# Patient Record
Sex: Male | Born: 1960 | Race: White | Hispanic: No | Smoking: Never smoker
Health system: Southern US, Community
[De-identification: ages and names within clinical notes are randomized; demographics above are authoritative.]

## PROBLEM LIST (undated history)

## (undated) DIAGNOSIS — Z8489 Family history of other specified conditions: Secondary | ICD-10-CM

## (undated) DIAGNOSIS — IMO0001 Reserved for inherently not codable concepts without codable children: Secondary | ICD-10-CM

## (undated) DIAGNOSIS — I472 Ventricular tachycardia, unspecified: Secondary | ICD-10-CM

## (undated) DIAGNOSIS — G709 Myoneural disorder, unspecified: Secondary | ICD-10-CM

## (undated) DIAGNOSIS — M869 Osteomyelitis, unspecified: Secondary | ICD-10-CM

## (undated) DIAGNOSIS — J45909 Unspecified asthma, uncomplicated: Secondary | ICD-10-CM

## (undated) DIAGNOSIS — Z8719 Personal history of other diseases of the digestive system: Secondary | ICD-10-CM

## (undated) DIAGNOSIS — Z87442 Personal history of urinary calculi: Secondary | ICD-10-CM

## (undated) DIAGNOSIS — Z95810 Presence of automatic (implantable) cardiac defibrillator: Secondary | ICD-10-CM

## (undated) DIAGNOSIS — I10 Essential (primary) hypertension: Secondary | ICD-10-CM

## (undated) DIAGNOSIS — M1990 Unspecified osteoarthritis, unspecified site: Secondary | ICD-10-CM

## (undated) DIAGNOSIS — I5022 Chronic systolic (congestive) heart failure: Secondary | ICD-10-CM

## (undated) DIAGNOSIS — E876 Hypokalemia: Secondary | ICD-10-CM

## (undated) DIAGNOSIS — I4901 Ventricular fibrillation: Secondary | ICD-10-CM

## (undated) DIAGNOSIS — E119 Type 2 diabetes mellitus without complications: Secondary | ICD-10-CM

## (undated) DIAGNOSIS — H269 Unspecified cataract: Secondary | ICD-10-CM

## (undated) DIAGNOSIS — I509 Heart failure, unspecified: Secondary | ICD-10-CM

## (undated) HISTORY — PX: OTHER SURGICAL HISTORY: SHX169

## (undated) HISTORY — PX: HERNIA REPAIR: SHX51

## (undated) HISTORY — PX: HIP SURGERY: SHX245

## (undated) HISTORY — PX: TONSILLECTOMY: SUR1361

## (undated) HISTORY — PX: FRACTURE SURGERY: SHX138

---

## 2000-07-15 HISTORY — PX: BACK SURGERY: SHX140

## 2003-06-12 ENCOUNTER — Encounter: Admission: RE | Admit: 2003-06-12 | Discharge: 2003-09-10 | Payer: Self-pay | Admitting: Internal Medicine

## 2003-10-27 ENCOUNTER — Encounter: Admission: RE | Admit: 2003-10-27 | Discharge: 2004-01-25 | Payer: Self-pay | Admitting: Internal Medicine

## 2005-12-04 ENCOUNTER — Emergency Department (HOSPITAL_COMMUNITY): Admission: EM | Admit: 2005-12-04 | Discharge: 2005-12-04 | Payer: Self-pay | Admitting: Emergency Medicine

## 2006-08-10 DIAGNOSIS — M549 Dorsalgia, unspecified: Secondary | ICD-10-CM | POA: Insufficient documentation

## 2006-08-10 DIAGNOSIS — I10 Essential (primary) hypertension: Secondary | ICD-10-CM | POA: Insufficient documentation

## 2010-08-15 HISTORY — PX: OTHER SURGICAL HISTORY: SHX169

## 2010-09-27 DIAGNOSIS — I509 Heart failure, unspecified: Secondary | ICD-10-CM | POA: Insufficient documentation

## 2012-08-15 HISTORY — PX: CARDIAC CATHETERIZATION: SHX172

## 2015-07-17 ENCOUNTER — Inpatient Hospital Stay (HOSPITAL_COMMUNITY)
Admission: EM | Admit: 2015-07-17 | Discharge: 2015-07-23 | DRG: 981 | Disposition: A | Discharge status: Home Health | Payer: Managed Care, Other (non HMO) | Attending: Internal Medicine | Admitting: Internal Medicine

## 2015-07-17 ENCOUNTER — Encounter (HOSPITAL_COMMUNITY): Payer: Self-pay

## 2015-07-17 ENCOUNTER — Emergency Department (HOSPITAL_COMMUNITY): Payer: Managed Care, Other (non HMO)

## 2015-07-17 DIAGNOSIS — I5022 Chronic systolic (congestive) heart failure: Secondary | ICD-10-CM | POA: Diagnosis not present

## 2015-07-17 DIAGNOSIS — Z23 Encounter for immunization: Secondary | ICD-10-CM

## 2015-07-17 DIAGNOSIS — I5033 Acute on chronic diastolic (congestive) heart failure: Secondary | ICD-10-CM | POA: Diagnosis not present

## 2015-07-17 DIAGNOSIS — S42309A Unspecified fracture of shaft of humerus, unspecified arm, initial encounter for closed fracture: Secondary | ICD-10-CM | POA: Diagnosis present

## 2015-07-17 DIAGNOSIS — Z95810 Presence of automatic (implantable) cardiac defibrillator: Secondary | ICD-10-CM | POA: Diagnosis not present

## 2015-07-17 DIAGNOSIS — I951 Orthostatic hypotension: Secondary | ICD-10-CM | POA: Diagnosis present

## 2015-07-17 DIAGNOSIS — J45909 Unspecified asthma, uncomplicated: Secondary | ICD-10-CM | POA: Diagnosis present

## 2015-07-17 DIAGNOSIS — R0600 Dyspnea, unspecified: Secondary | ICD-10-CM | POA: Insufficient documentation

## 2015-07-17 DIAGNOSIS — T502X5A Adverse effect of carbonic-anhydrase inhibitors, benzothiadiazides and other diuretics, initial encounter: Secondary | ICD-10-CM | POA: Diagnosis present

## 2015-07-17 DIAGNOSIS — Z6833 Body mass index (BMI) 33.0-33.9, adult: Secondary | ICD-10-CM | POA: Diagnosis not present

## 2015-07-17 DIAGNOSIS — S42302D Unspecified fracture of shaft of humerus, left arm, subsequent encounter for fracture with routine healing: Secondary | ICD-10-CM | POA: Diagnosis not present

## 2015-07-17 DIAGNOSIS — S42301A Unspecified fracture of shaft of humerus, right arm, initial encounter for closed fracture: Secondary | ICD-10-CM | POA: Diagnosis not present

## 2015-07-17 DIAGNOSIS — E876 Hypokalemia: Secondary | ICD-10-CM | POA: Diagnosis present

## 2015-07-17 DIAGNOSIS — W19XXXA Unspecified fall, initial encounter: Secondary | ICD-10-CM | POA: Diagnosis present

## 2015-07-17 DIAGNOSIS — I429 Cardiomyopathy, unspecified: Secondary | ICD-10-CM | POA: Diagnosis present

## 2015-07-17 DIAGNOSIS — E1165 Type 2 diabetes mellitus with hyperglycemia: Secondary | ICD-10-CM | POA: Diagnosis present

## 2015-07-17 DIAGNOSIS — Z79899 Other long term (current) drug therapy: Secondary | ICD-10-CM

## 2015-07-17 DIAGNOSIS — Z7682 Awaiting organ transplant status: Secondary | ICD-10-CM | POA: Diagnosis not present

## 2015-07-17 DIAGNOSIS — Z8249 Family history of ischemic heart disease and other diseases of the circulatory system: Secondary | ICD-10-CM

## 2015-07-17 DIAGNOSIS — E871 Hypo-osmolality and hyponatremia: Secondary | ICD-10-CM | POA: Diagnosis present

## 2015-07-17 DIAGNOSIS — I509 Heart failure, unspecified: Secondary | ICD-10-CM | POA: Diagnosis not present

## 2015-07-17 DIAGNOSIS — E860 Dehydration: Principal | ICD-10-CM | POA: Diagnosis present

## 2015-07-17 DIAGNOSIS — S4290XA Fracture of unspecified shoulder girdle, part unspecified, initial encounter for closed fracture: Secondary | ICD-10-CM | POA: Diagnosis present

## 2015-07-17 DIAGNOSIS — E118 Type 2 diabetes mellitus with unspecified complications: Secondary | ICD-10-CM

## 2015-07-17 DIAGNOSIS — S4291XD Fracture of right shoulder girdle, part unspecified, subsequent encounter for fracture with routine healing: Secondary | ICD-10-CM

## 2015-07-17 DIAGNOSIS — S42211A Unspecified displaced fracture of surgical neck of right humerus, initial encounter for closed fracture: Secondary | ICD-10-CM | POA: Diagnosis present

## 2015-07-17 DIAGNOSIS — I5023 Acute on chronic systolic (congestive) heart failure: Secondary | ICD-10-CM | POA: Diagnosis present

## 2015-07-17 DIAGNOSIS — I959 Hypotension, unspecified: Secondary | ICD-10-CM

## 2015-07-17 DIAGNOSIS — S4291XA Fracture of right shoulder girdle, part unspecified, initial encounter for closed fracture: Secondary | ICD-10-CM

## 2015-07-17 DIAGNOSIS — S42301K Unspecified fracture of shaft of humerus, right arm, subsequent encounter for fracture with nonunion: Secondary | ICD-10-CM | POA: Diagnosis not present

## 2015-07-17 HISTORY — DX: Heart failure, unspecified: I50.9

## 2015-07-17 HISTORY — DX: Unspecified asthma, uncomplicated: J45.909

## 2015-07-17 HISTORY — DX: Chronic systolic (congestive) heart failure: I50.22

## 2015-07-17 LAB — CBC WITH DIFFERENTIAL/PLATELET
BASOS PCT: 0 %
Basophils Absolute: 0 10*3/uL (ref 0.0–0.1)
EOS PCT: 0 %
Eosinophils Absolute: 0 10*3/uL (ref 0.0–0.7)
HEMATOCRIT: 36.8 % — AB (ref 39.0–52.0)
Hemoglobin: 13.6 g/dL (ref 13.0–17.0)
Lymphocytes Relative: 11 %
Lymphs Abs: 1.6 10*3/uL (ref 0.7–4.0)
MCH: 33.9 pg (ref 26.0–34.0)
MCHC: 37 g/dL — AB (ref 30.0–36.0)
MCV: 91.8 fL (ref 78.0–100.0)
MONO ABS: 0.7 10*3/uL (ref 0.1–1.0)
MONOS PCT: 5 %
NEUTROS ABS: 12.7 10*3/uL — AB (ref 1.7–7.7)
Neutrophils Relative %: 85 %
PLATELETS: 271 10*3/uL (ref 150–400)
RBC: 4.01 MIL/uL — ABNORMAL LOW (ref 4.22–5.81)
RDW: 12.6 % (ref 11.5–15.5)
WBC: 15 10*3/uL — ABNORMAL HIGH (ref 4.0–10.5)

## 2015-07-17 LAB — COMPREHENSIVE METABOLIC PANEL
ALBUMIN: 2.8 g/dL — AB (ref 3.5–5.0)
ALT: 61 U/L (ref 17–63)
ANION GAP: 15 (ref 5–15)
AST: 155 U/L — ABNORMAL HIGH (ref 15–41)
Alkaline Phosphatase: 120 U/L (ref 38–126)
BILIRUBIN TOTAL: 2.2 mg/dL — AB (ref 0.3–1.2)
BUN: 21 mg/dL — ABNORMAL HIGH (ref 6–20)
CALCIUM: 8.7 mg/dL — AB (ref 8.9–10.3)
CHLORIDE: 66 mmol/L — AB (ref 101–111)
CO2: 36 mmol/L — AB (ref 22–32)
Creatinine, Ser: 1.31 mg/dL — ABNORMAL HIGH (ref 0.61–1.24)
GFR calc non Af Amer: >60 mL/min (ref >60)
GLUCOSE: 157 mg/dL — AB (ref 65–99)
Sodium: 117 mmol/L — CL (ref 135–145)
Total Protein: 5.7 g/dL — ABNORMAL LOW (ref 6.5–8.1)

## 2015-07-17 LAB — BASIC METABOLIC PANEL
Anion gap: 15 (ref 5–15)
BUN: 22 mg/dL — AB (ref 6–20)
CO2: 32 mmol/L (ref 22–32)
CREATININE: 1.28 mg/dL — AB (ref 0.61–1.24)
Calcium: 8.2 mg/dL — ABNORMAL LOW (ref 8.9–10.3)
Chloride: 69 mmol/L — ABNORMAL LOW (ref 101–111)
GFR calc Af Amer: >60 mL/min (ref >60)
GLUCOSE: 231 mg/dL — AB (ref 65–99)
SODIUM: 116 mmol/L — AB (ref 135–145)

## 2015-07-17 LAB — I-STAT TROPONIN, ED
TROPONIN I, POC: 0.01 ng/mL (ref 0.00–0.08)
TROPONIN I, POC: 0.04 ng/mL (ref 0.00–0.08)

## 2015-07-17 LAB — BRAIN NATRIURETIC PEPTIDE: B Natriuretic Peptide: 63.4 pg/mL (ref 0.0–100.0)

## 2015-07-17 LAB — MAGNESIUM: MAGNESIUM: 2.3 mg/dL (ref 1.7–2.4)

## 2015-07-17 MED ORDER — POTASSIUM CHLORIDE 10 MEQ/100ML IV SOLN
10.0000 meq | INTRAVENOUS | Status: AC
  Administered 2015-07-17 – 2015-07-18 (×4): 10 meq via INTRAVENOUS
  Filled 2015-07-17 (×4): qty 100

## 2015-07-17 MED ORDER — POTASSIUM CHLORIDE CRYS ER 20 MEQ PO TBCR
40.0000 meq | EXTENDED_RELEASE_TABLET | Freq: Once | ORAL | Status: AC
  Administered 2015-07-17: 40 meq via ORAL
  Filled 2015-07-17: qty 2

## 2015-07-17 MED ORDER — SODIUM CHLORIDE 0.9 % IV BOLUS (SEPSIS)
500.0000 mL | Freq: Once | INTRAVENOUS | Status: AC
  Administered 2015-07-17: 500 mL via INTRAVENOUS

## 2015-07-17 NOTE — H&P (Signed)
PCP: No PCP Per Patient    Referring provider Sch[place]man   Chief Complaint:  Arm pain HPI: Justin Bernard is a 54 y.o. male   has a past medical history of CHF (congestive heart failure) (HCC) and Asthma.   Presented with right shoulder pain after a twisting motion yesterday. He has been feeling lightheaded and had had low blood pressures at home. Patient reports he has history of CHF followed by [facility]. He status post defibrillator. Reports he was on the phone with Cardiology at [facility] reporting lightheadedness and then presented to his PCP he was transferred to [facility] from there.  Patient has been on Lasix and has been compliant. In emergency department he was found to have sodium less than 117 potassium less than 2 creatinine 1.3 white blood cell count 15 imaging of the right shoulder showed commuted angulated and displaced proximal right humerus fracture involving surgical neck.  In emergency room patient was found to be hypotensive down to 88/57. He reports feeling lightheaded at home and falling occasionaly. No loss of consciousness no head injury. Reports eating well but avoiding salt. He has been drinking water and orange juice.  No recent diarrhea no nausea no vomiting.   Hospitalist was called for admission for evidence of dehydration and hypotension and hyponatremia with hypokalemia  Review of Systems:    Pertinent positives include: Feeling  Lightheaded  Constitutional:  No weight loss, night sweats, Fevers, chills, fatigue, weight loss  HEENT:  No headaches, Difficulty swallowing,Tooth/dental problems,Sore throat,  No sneezing, itching, ear ache, nasal congestion, post nasal drip,  Cardio-vascular:  No chest pain, Orthopnea, PND, anasarca, dizziness, palpitations.no Bilateral lower extremity swelling  GI:  No heartburn, indigestion, abdominal pain, nausea, vomiting, diarrhea, change in bowel habits, loss of appetite, melena, blood in stool, hematemesis Resp:  no  shortness of breath at rest. No dyspnea on exertion, No excess mucus, no productive cough, No non-productive cough, No coughing up of blood.No change in color of mucus.No wheezing. Skin:  no rash or lesions. No jaundice GU:  no dysuria, change in color of urine, no urgency or frequency. No straining to urinate.  No flank pain.  Musculoskeletal:  No joint pain or no joint swelling. No decreased range of motion. No back pain.  Psych:  No change in mood or affect. No depression or anxiety. No memory loss.  Neuro: no localizing neurological complaints, no tingling, no weakness, no double vision, no gait abnormality, no slurred speech, no confusion  Otherwise ROS are negative except for above, 10 systems were reviewed  Past Medical History: Past Medical History  Diagnosis Date  . CHF (congestive heart failure) (HCC)   . Asthma    Past Surgical History  Procedure Laterality Date  . Back surgery       Medications: Prior to Admission medications   Medication Sig Start Date End Date Taking? Authorizing Provider  carvedilol (COREG) 25 MG tablet Take 12.5 mg by mouth 2 (two) times daily with a meal.  06/04/15  Yes Historical Provider, MD  eplerenone (INSPRA) 25 MG tablet Take 25 mg by mouth daily. 06/04/15  Yes Historical Provider, MD  furosemide (LASIX) 40 MG tablet Take 40 mg by mouth 2 (two) times daily. 05/31/15  Yes Historical Provider, MD  Probiotic Product (PROBIOTIC PO) Take 1 tablet by mouth daily.   Yes Historical Provider, MD    Allergies:   Allergies  Allergen Reactions  . Penicillins Other (See Comments)    Possible childhood reaction  Social History:  Ambulatory   Independently  Lives at home  With family     reports that he has never smoked. He has never used smokeless tobacco. He reports that he does not drink alcohol or use illicit drugs.    Family History: family history includes Hypertension in his other.    Physical Exam: Patient Vitals for the past  24 hrs:  BP Temp Temp src Pulse Resp SpO2  07/17/15 2145 114/65 mmHg - - 94 17 94 %  07/17/15 2130 114/79 mmHg - - 97 - 93 %  07/17/15 2115 128/72 mmHg - - 93 19 92 %  07/17/15 2100 100/84 mmHg - - 92 20 96 %  07/17/15 2046 - - - 91 18 95 %  07/17/15 2045 112/71 mmHg - - - - -  07/17/15 2031 - - - 86 17 95 %  07/17/15 2030 106/65 mmHg - - - - -  07/17/15 2000 116/67 mmHg - - 90 20 95 %  07/17/15 1930 95/67 mmHg - - 93 15 92 %  07/17/15 1915 109/70 mmHg - - 88 17 -  07/17/15 1900 (!) 88/57 mmHg - - 94 13 98 %  07/17/15 1845 - 98 F (36.7 C) Oral - - -  07/17/15 1845 107/71 mmHg - - 92 10 95 %  07/17/15 1843 98/86 mmHg - - 91 - 94 %  07/17/15 1700 - - - 89 15 96 %  07/17/15 1640 - - - - - 92 %    1. General:  in No Acute distress 2. Psychological: Alert and Oriented 3. Head/ENT:    Dry Mucous Membranes                          Head Non traumatic, neck supple                          Normal  Dentition 4. SKIN:  decreased Skin turgor,  Skin clean Dry and intact no rash 5. Heart: Regular rate and rhythm no Murmur, Rub or gallop 6. Lungs: Clear to auscultation bilaterally, no wheezes or crackles   7. Abdomen: Soft, non-tender, Non distended 8. Lower extremities: no clubbing, cyanosis, or edema 9. Neurologically Grossly intact, moving all 4 extremities equally 10. MSK: Normal range of motion  body mass index is unknown because there is no height or weight on file.   Labs on Admission:   Results for orders placed or performed during the hospital encounter of 07/17/15 (from the past 24 hour(s))  CBC with Differential     Status: Abnormal   Collection Time: 07/17/15  7:56 PM  Result Value Ref Range   WBC 15.0 (H) 4.0 - 10.5 K/uL   RBC 4.01 (L) 4.22 - 5.81 MIL/uL   Hemoglobin 13.6 13.0 - 17.0 g/dL   HCT 15.6 (L) 15.3 - 79.4 %   MCV 91.8 78.0 - 100.0 fL   MCH 33.9 26.0 - 34.0 pg   MCHC 37.0 (H) 30.0 - 36.0 g/dL   RDW 32.7 61.4 - 70.9 %   Platelets 271 150 - 400 K/uL    Neutrophils Relative % 85 %   Neutro Abs 12.7 (H) 1.7 - 7.7 K/uL   Lymphocytes Relative 11 %   Lymphs Abs 1.6 0.7 - 4.0 K/uL   Monocytes Relative 5 %   Monocytes Absolute 0.7 0.1 - 1.0 K/uL   Eosinophils Relative 0 %   Eosinophils Absolute  0.0 0.0 - 0.7 K/uL   Basophils Relative 0 %   Basophils Absolute 0.0 0.0 - 0.1 K/uL  Comprehensive metabolic panel     Status: Abnormal   Collection Time: 07/17/15  7:56 PM  Result Value Ref Range   Sodium 117 (LL) 135 - 145 mmol/L   Potassium <2.0 (LL) 3.5 - 5.1 mmol/L   Chloride 66 (L) 101 - 111 mmol/L   CO2 36 (H) 22 - 32 mmol/L   Glucose, Bld 157 (H) 65 - 99 mg/dL   BUN 21 (H) 6 - 20 mg/dL   Creatinine, Ser 4.09 (H) 0.61 - 1.24 mg/dL   Calcium 8.7 (L) 8.9 - 10.3 mg/dL   Total Protein 5.7 (L) 6.5 - 8.1 g/dL   Albumin 2.8 (L) 3.5 - 5.0 g/dL   AST 811 (H) 15 - 41 U/L   ALT 61 17 - 63 U/L   Alkaline Phosphatase 120 38 - 126 U/L   Total Bilirubin 2.2 (H) 0.3 - 1.2 mg/dL   GFR calc non Af Amer >60 >60 mL/min   GFR calc Af Amer >60 >60 mL/min   Anion gap 15 5 - 15  Brain natriuretic peptide     Status: None   Collection Time: 07/17/15  7:56 PM  Result Value Ref Range   B Natriuretic Peptide 63.4 0.0 - 100.0 pg/mL  Magnesium     Status: None   Collection Time: 07/17/15  7:56 PM  Result Value Ref Range   Magnesium 2.3 1.7 - 2.4 mg/dL  I-Stat Troponin, ED - 0, 3, 6 hours (not at [facility])     Status: None   Collection Time: 07/17/15  7:58 PM  Result Value Ref Range   Troponin i, poc 0.01 0.00 - 0.08 ng/mL   Comment 3          I-Stat Troponin, ED - 0, 3, 6 hours (not at [facility])     Status: None   Collection Time: 07/17/15 10:30 PM  Result Value Ref Range   Troponin i, poc 0.04 0.00 - 0.08 ng/mL   Comment 3            UA pending  No results found for: HGBA1C  CrCl cannot be calculated (Unknown ideal weight.).  BNP (last 3 results) No results for input(s): PROBNP in the last 8760 hours.  Other results:  I have pearsonaly reviewed  this: ECG REPORT  Rate:107 Rhythm:  sinus tachycardia  ST&T Change: No ischemic change   QTC 638    There were no vitals filed for this visit.   Cultures: No results found for: SDES, SPECREQUEST, CULT, REPTSTATUS   Radiological Exams on Admission: Dg Chest 1 View  07/17/2015  CLINICAL DATA:  54 year old male with hypotension. Medical history of CHF and asthma. EXAM: CHEST 1 VIEW COMPARISON:  Chest radiograph dated 12/04/2005 FINDINGS: Single frontal view of the chest does not demonstrate any focal consolidation, pleural effusion, or pneumothorax. Left lung base clear linear plaque likely atelectatic changes noted. The cardiac silhouette is within normal limits. Left pectoral AICD device. The osseous structures are grossly unremarkable. IMPRESSION: No active disease. Electronically Signed   By: Elgie Collard M.D.   On: 07/17/2015 20:54   Dg Shoulder Right  07/17/2015  CLINICAL DATA:  Right shoulder injury EXAM: RIGHT SHOULDER - 2+ VIEW COMPARISON:  None. FINDINGS: There is a comminuted right proximal humerus fracture involving the surgical neck, with mild apex lateral angulation, slight overriding of the fracture fragments and 1 shaft's with anterior  displacement of the dominant distal fracture fragment. No dislocation at the right glenohumeral joint. 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 IMPRESSION: Comminuted, angulated and displaced proximal right humerus fracture involving the surgical neck. Electronically Signed   By: [name] M.D.   On: 07/17/2015 17:44    Chart has been reviewed  Family not  at  Bedside    Assessment/Plan 54 yo M with Hx of systolic CHF since 2011 reports last EF was to to 47%  Here with dehydration, hyponatremia down to 117, hypokalemia down to <2.0 admit to stepdown due to hypotension.   Present on Admission:  . Hypokalemia - will replace and continue to monitor, hold lasix for  tonight . Orthostatic hypotension - will hold lasix  appears to be over diuresed, would benefit from discussing his medication regimen with cardiology at [facility] [phone] . Chronic systolic CHF (congestive heart failure), NYHA class 2 (HCC) - currently appears dry will hold lasix and rehydrate . Dehydration - administer IV, hold lasix . Hyponatremia - likely due to dehydration and low Sodium diet, will rehydrate with Normal salin, obtain orthostatics, restrict free fluid, serial BMET to monitor Na levels . Fracture closed, humerus - orthopedics in AM . Fall - no LOC, no head injury likely due to orthostatic hypotension   Prophylaxis:  Lovenox   CODE STATUS:   DNI but Ok to do CPR no intubation as per patient    Disposition:  To home once workup is complete and patient is stable  Other plan as per orders.  I have spent a total of 65 min on this admission extra time was taken to discuss case with E-link  Tedford Berg 07/17/2015, 11:33 PM  [facility]  Pager [phone]   after 2 AM please page floor coverage PA If 7AM-7PM, please contact the day team taking care of the patient  [email or web address]  Password [email or web address]

## 2015-07-17 NOTE — ED Provider Notes (Signed)
CSN: [id number]     Arrival date & time 07/17/15  1640 History   First MD Initiated Contact with Patient 07/17/15 1707     Chief Complaint  Patient presents with  . Shoulder Injury  . Hypotension  . Dizziness     (Consider location/radiation/quality/duration/timing/severity/associated sxs/prior Treatment) HPI Comments: Stood up, felt lightheaded, grabbed footboard, shen sat back down last night when woke from nap at 1030 was achy.  Has had lightheadedness, primarily while going from sitting to standing BP running lower 90s systolic, 100s No recent changes in medications Called Cardiologist-- recommended he come to ED Pt denies fall at time of ar injury, however reports felt lightheaded then started tof all and caught self with pain to right arm, then went back to sleep and woke with right arm ache, moderate pain       Patient is a 54 y.o. male presenting with shoulder injury and dizziness.  Shoulder Injury This is a new problem. Episode onset: last night. Pertinent negatives include no chest pain, no abdominal pain, no headaches and no shortness of breath. He has tried nothing for the symptoms.  Dizziness Associated symptoms: no chest pain, no diarrhea, no headaches, no nausea, no shortness of breath and no vomiting     Past Medical History  Diagnosis Date  . CHF (congestive heart failure) (HCC)   . Asthma    Past Surgical History  Procedure Laterality Date  . Back surgery     Family History  Problem Relation Age of Onset  . Hypertension Other   . CAD Other   . Heart failure Mother   . Hypertension Mother   . CAD Father    Social History  Substance Use Topics  . Smoking status: Never Smoker   . Smokeless tobacco: Never Used  . Alcohol Use: No    Review of Systems  Constitutional: Positive for fatigue. Negative for fever.  HENT: Negative for sore throat.   Eyes: Negative for visual disturbance.  Respiratory: Negative for shortness of breath.    Cardiovascular: Negative for chest pain and leg swelling.  Gastrointestinal: Negative for nausea, vomiting, abdominal pain and diarrhea.  Genitourinary: Negative for difficulty urinating.  Musculoskeletal: Negative for back pain and neck stiffness.  Skin: Negative for rash.  Neurological: Positive for dizziness and light-headedness. Negative for syncope and headaches.      Allergies  Penicillins  Home Medications   Prior to Admission medications   Medication Sig Start Date End Date Taking? Authorizing Provider  carvedilol (COREG) 25 MG tablet Take 12.5 mg by mouth 2 (two) times daily with a meal.  06/04/15  Yes Historical Provider, MD  eplerenone (INSPRA) 25 MG tablet Take 25 mg by mouth daily. 06/04/15  Yes Historical Provider, MD  furosemide (LASIX) 40 MG tablet Take 40 mg by mouth 2 (two) times daily. 05/31/15  Yes Historical Provider, MD  Probiotic Product (PROBIOTIC PO) Take 1 tablet by mouth daily.   Yes Historical Provider, MD   BP 103/55 mmHg  Pulse 88  Temp(Src) 98 F (36.7 C) (Oral)  Resp 23  Ht  (1.93 m)  Wt 278 lb 10.6 oz (126.4 kg)  BMI 33.93 kg/m2  SpO2 96% Physical Exam  Constitutional: He is oriented to person, place, and time. He appears well-developed and well-nourished. No distress.  HENT:  Head: Normocephalic and atraumatic.  Eyes: Conjunctivae and EOM are normal.  Neck: Normal range of motion. No JVD present.  Cardiovascular: Normal rate, regular rhythm, normal heart sounds and intact  distal pulses.  Exam reveals no gallop and no friction rub.   No murmur heard. Pulmonary/Chest: Effort normal and breath sounds normal. No respiratory distress. He has no wheezes. He has no rales.  Abdominal: Soft. He exhibits no distension. There is no tenderness. There is no guarding.  Musculoskeletal: He exhibits tenderness (contusion right arm). He exhibits no edema.  Neurological: He is alert and oriented to person, place, and time.  Skin: Skin is warm and dry.  He is not diaphoretic.  Nursing note and vitals reviewed.   ED Course  Procedures (including critical care time) Labs Review Labs Reviewed  CBC WITH DIFFERENTIAL/PLATELET - Abnormal; Notable for the following:    WBC 15.0 (*)    RBC 4.01 (*)    HCT 36.8 (*)    MCHC 37.0 (*)    Neutro Abs 12.7 (*)    All other components within normal limits  COMPREHENSIVE METABOLIC PANEL - Abnormal; Notable for the following:    Sodium 117 (*)    Potassium <2.0 (*)    Chloride 66 (*)    CO2 36 (*)    Glucose, Bld 157 (*)    BUN 21 (*)    Creatinine, Ser 1.31 (*)    Calcium 8.7 (*)    Total Protein 5.7 (*)    Albumin 2.8 (*)    AST 155 (*)    Total Bilirubin 2.2 (*)    All other components within normal limits  URINALYSIS, ROUTINE W REFLEX MICROSCOPIC (NOT AT [facility]) - Abnormal; Notable for the following:    Color, Urine AMBER (*)    APPearance CLOUDY (*)    Hgb urine dipstick LARGE (*)    Bilirubin Urine SMALL (*)    Ketones, ur 15 (*)    Protein, ur 30 (*)    Leukocytes, UA SMALL (*)    All other components within normal limits  BASIC METABOLIC PANEL - Abnormal; Notable for the following:    Sodium 116 (*)    Potassium <2.0 (*)    Chloride 69 (*)    Glucose, Bld 231 (*)    BUN 22 (*)    Creatinine, Ser 1.28 (*)    Calcium 8.2 (*)    All other components within normal limits  URINE MICROSCOPIC-ADD ON - Abnormal; Notable for the following:    Squamous Epithelial / LPF 0-5 (*)    Bacteria, UA RARE (*)    Casts HYALINE CASTS (*)    All other components within normal limits  COMPREHENSIVE METABOLIC PANEL - Abnormal; Notable for the following:    Sodium 118 (*)    Potassium 2.1 (*)    Chloride 76 (*)    Glucose, Bld 199 (*)    BUN 22 (*)    Creatinine, Ser 1.38 (*)    Calcium 8.0 (*)    Total Protein 4.9 (*)    Albumin 2.4 (*)    AST 114 (*)    Total Bilirubin 2.0 (*)    GFR calc non Af Amer 57 (*)    All other components within normal limits  CBC - Abnormal; Notable for the  following:    WBC 13.3 (*)    RBC 3.48 (*)    Hemoglobin 11.8 (*)    HCT 32.8 (*)    All other components within normal limits  BASIC METABOLIC PANEL - Abnormal; Notable for the following:    Sodium 118 (*)    Potassium 2.2 (*)    Chloride 77 (*)  Glucose, Bld 167 (*)    Calcium 7.9 (*)    All other components within normal limits  BASIC METABOLIC PANEL - Abnormal; Notable for the following:    Sodium 118 (*)    Potassium 2.4 (*)    Chloride 78 (*)    Glucose, Bld 171 (*)    BUN 21 (*)    Calcium 8.1 (*)    All other components within normal limits  URINALYSIS, ROUTINE W REFLEX MICROSCOPIC (NOT AT [facility]) - Abnormal; Notable for the following:    APPearance CLOUDY (*)    Hgb urine dipstick LARGE (*)    Leukocytes, UA MODERATE (*)    All other components within normal limits  TROPONIN I - Abnormal; Notable for the following:    Troponin I 0.04 (*)    All other components within normal limits  URINE MICROSCOPIC-ADD ON - Abnormal; Notable for the following:    Squamous Epithelial / LPF 0-5 (*)    Bacteria, UA FEW (*)    All other components within normal limits  CORTISOL-AM, BLOOD - Abnormal; Notable for the following:    Cortisol - AM 22.8 (*)    All other components within normal limits  URINE CULTURE  MRSA PCR SCREENING  BRAIN NATRIURETIC PEPTIDE  MAGNESIUM  CREATININE, URINE, RANDOM  SODIUM, URINE, RANDOM  OSMOLALITY, URINE  MAGNESIUM  PHOSPHORUS  TSH  TROPONIN I  BASIC METABOLIC PANEL  BASIC METABOLIC PANEL  BASIC METABOLIC PANEL  TROPONIN I  I-STAT TROPOININ, ED  I-STAT TROPOININ, ED  Rosezena Sensor, ED    Imaging Review Dg Chest 1 View  07/17/2015  CLINICAL DATA:  54 year old male with hypotension. Medical history of CHF and asthma. EXAM: CHEST 1 VIEW COMPARISON:  Chest radiograph dated 12/04/2005 FINDINGS: Single frontal view of the chest does not demonstrate any focal consolidation, pleural effusion, or pneumothorax. Left lung base clear linear plaque  likely atelectatic changes noted. The cardiac silhouette is within normal limits. Left pectoral AICD device. The osseous structures are grossly unremarkable. IMPRESSION: No active disease. Electronically Signed   By: Elgie Collard M.D.   On: 07/17/2015 20:54   Dg Shoulder Right  07/17/2015  CLINICAL DATA:  Right shoulder injury EXAM: RIGHT SHOULDER - 2+ VIEW COMPARISON:  None. FINDINGS: There is a comminuted right proximal humerus fracture involving the surgical neck, with mild apex lateral angulation, slight overriding of the fracture fragments and 1 shaft's with anterior displacement of the dominant distal fracture fragment. No dislocation at the right glenohumeral joint. 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 IMPRESSION: Comminuted, angulated and displaced proximal right humerus fracture involving the surgical neck. Electronically Signed   By: [name] M.D.   On: 07/17/2015 17:44   I have personally reviewed and evaluated these images and lab results as part of my medical decision-making.   EKG Interpretation   Date/Time:  Friday July 17 2015 16:55:15 EST Ventricular Rate:  107 PR Interval:  185 QRS Duration: 115 QT Interval:  475 QTC Calculation: 634 R Axis:   46 Text Interpretation:  Sinus tachycardia Ventricular premature complex  Nonspecific intraventricular conduction delay Inferior infarct, old  Probable anteroseptal infarct, old Abnrm T, consider ischemia,  anterolateral lds No prior ECGs available Confirmed by [facility] MD, Garyson Stelly  ([id number]) on 07/17/2015 11:10:04 PM       CRITICAL CARE:hypotension, electrolyte abnormalities severe hyponatremia/kalemia Performed by: Rhae Lerner   Total critical care time: 30 minutes  Critical care time was exclusive of separately billable procedures and treating  other patients.  Critical care was necessary to treat or prevent imminent or life-threatening deterioration.  Critical care was time  spent personally by me on the following activities: development of treatment plan with patient and/or surrogate as well as nursing, discussions with consultants, evaluation of patient's response to treatment, examination of patient, obtaining history from patient or surrogate, ordering and performing treatments and interventions, ordering and review of laboratory studies, ordering and review of radiographic studies, pulse oximetry and re-evaluation of patient's condition.   MDM   Final diagnoses:  Hypokalemia  Hyponatremia   54 year old male with a history of chronic systolic heart failure with defibrillator in place followed by [facility] cardiology presents with concern of lightheadedness and low blood pressures at home as well as right shoulder pain.  Patient denies fall, however step-son reports he was found down laying on ground yesterday.  Patient denies head trauma/HA, has no neurologic symptoms, no blood thinners, and doubt intracranial bleed.  XR of right shoulder shows proximal humerus fx. Pt nv intact, and will place in sling.  Patient with lightheadedness, orthostatic symptoms, low BP with history of lasix usage.  No sign of current chf exacerbation.  Labs show significant dehydration/electrolytea abnormalities with potassium less than 2, Na of 117.  Pt give 500cc NS initially followed by potassium iV and po.  He will be admitted to hospitalist for further care to stepdown.    Alvira Monday, MD 07/18/15 1535

## 2015-07-17 NOTE — ED Notes (Signed)
Pt arrived via [facility] from home c/o right shoulder pain.  Pt states he has been dizzy due to hypotension recently, he was getting out of bed yesterday grabbed the footboard and injured his shoulder.  Pt states he did not fall and hit the floor, pain started this morning. Hx:CHF

## 2015-07-18 ENCOUNTER — Encounter (HOSPITAL_COMMUNITY): Payer: Self-pay | Admitting: *Deleted

## 2015-07-18 ENCOUNTER — Inpatient Hospital Stay (HOSPITAL_COMMUNITY): Payer: Managed Care, Other (non HMO)

## 2015-07-18 DIAGNOSIS — I509 Heart failure, unspecified: Secondary | ICD-10-CM

## 2015-07-18 LAB — BASIC METABOLIC PANEL
Anion gap: 11 (ref 5–15)
Anion gap: 11 (ref 5–15)
Anion gap: 9 (ref 5–15)
Anion gap: 11 (ref 5–15)
BUN: 19 mg/dL (ref 6–20)
BUN: 21 mg/dL — ABNORMAL HIGH (ref 6–20)
BUN: 20 mg/dL (ref 6–20)
BUN: 19 mg/dL (ref 6–20)
CALCIUM: 7.9 mg/dL — AB (ref 8.9–10.3)
CALCIUM: 8.1 mg/dL — AB (ref 8.9–10.3)
CALCIUM: 8 mg/dL — AB (ref 8.9–10.3)
CALCIUM: 8.2 mg/dL — AB (ref 8.9–10.3)
CO2: 30 mmol/L (ref 22–32)
CO2: 29 mmol/L (ref 22–32)
CO2: 30 mmol/L (ref 22–32)
CO2: 29 mmol/L (ref 22–32)
CREATININE: 1.11 mg/dL (ref 0.61–1.24)
CREATININE: 1.09 mg/dL (ref 0.61–1.24)
CREATININE: 1.15 mg/dL (ref 0.61–1.24)
CREATININE: 1.22 mg/dL (ref 0.61–1.24)
Chloride: 77 mmol/L — ABNORMAL LOW (ref 101–111)
Chloride: 78 mmol/L — ABNORMAL LOW (ref 101–111)
Chloride: 82 mmol/L — ABNORMAL LOW (ref 101–111)
Chloride: 82 mmol/L — ABNORMAL LOW (ref 101–111)
GFR calc Af Amer: >60 mL/min (ref >60)
GFR calc non Af Amer: >60 mL/min (ref >60)
GFR calc non Af Amer: >60 mL/min (ref >60)
GFR calc non Af Amer: >60 mL/min (ref >60)
GFR calc non Af Amer: >60 mL/min (ref >60)
GLUCOSE: 171 mg/dL — AB (ref 65–99)
GLUCOSE: 229 mg/dL — AB (ref 65–99)
GLUCOSE: 234 mg/dL — AB (ref 65–99)
Glucose, Bld: 167 mg/dL — ABNORMAL HIGH (ref 65–99)
Potassium: 2.2 mmol/L — CL (ref 3.5–5.1)
Potassium: 2.4 mmol/L — CL (ref 3.5–5.1)
Potassium: 2.4 mmol/L — CL (ref 3.5–5.1)
Potassium: 2.3 mmol/L — CL (ref 3.5–5.1)
Sodium: 118 mmol/L — CL (ref 135–145)
Sodium: 118 mmol/L — CL (ref 135–145)
Sodium: 121 mmol/L — ABNORMAL LOW (ref 135–145)
Sodium: 122 mmol/L — ABNORMAL LOW (ref 135–145)

## 2015-07-18 LAB — URINALYSIS, ROUTINE W REFLEX MICROSCOPIC
BILIRUBIN URINE: NEGATIVE
GLUCOSE, UA: NEGATIVE mg/dL
Glucose, UA: NEGATIVE mg/dL
KETONES UR: NEGATIVE mg/dL
Ketones, ur: 15 mg/dL — AB
NITRITE: NEGATIVE
Nitrite: NEGATIVE
PH: 6.5 (ref 5.0–8.0)
PH: 6.5 (ref 5.0–8.0)
Protein, ur: 30 mg/dL — AB
Protein, ur: NEGATIVE mg/dL
SPECIFIC GRAVITY, URINE: 1.016 (ref 1.005–1.030)
Specific Gravity, Urine: 1.007 (ref 1.005–1.030)

## 2015-07-18 LAB — COMPREHENSIVE METABOLIC PANEL
ALBUMIN: 2.4 g/dL — AB (ref 3.5–5.0)
ALK PHOS: 104 U/L (ref 38–126)
ALT: 48 U/L (ref 17–63)
AST: 114 U/L — AB (ref 15–41)
Anion gap: 14 (ref 5–15)
BILIRUBIN TOTAL: 2 mg/dL — AB (ref 0.3–1.2)
BUN: 22 mg/dL — AB (ref 6–20)
CALCIUM: 8 mg/dL — AB (ref 8.9–10.3)
CO2: 28 mmol/L (ref 22–32)
CREATININE: 1.38 mg/dL — AB (ref 0.61–1.24)
Chloride: 76 mmol/L — ABNORMAL LOW (ref 101–111)
GFR calc Af Amer: >60 mL/min (ref >60)
GFR calc non Af Amer: 57 mL/min — ABNORMAL LOW (ref >60)
GLUCOSE: 199 mg/dL — AB (ref 65–99)
POTASSIUM: 2.1 mmol/L — AB (ref 3.5–5.1)
SODIUM: 118 mmol/L — AB (ref 135–145)
TOTAL PROTEIN: 4.9 g/dL — AB (ref 6.5–8.1)

## 2015-07-18 LAB — CBC
HEMATOCRIT: 32.8 % — AB (ref 39.0–52.0)
Hemoglobin: 11.8 g/dL — ABNORMAL LOW (ref 13.0–17.0)
MCH: 33.9 pg (ref 26.0–34.0)
MCHC: 36 g/dL (ref 30.0–36.0)
MCV: 94.3 fL (ref 78.0–100.0)
Platelets: 229 10*3/uL (ref 150–400)
RBC: 3.48 MIL/uL — ABNORMAL LOW (ref 4.22–5.81)
RDW: 12.7 % (ref 11.5–15.5)
WBC: 13.3 10*3/uL — ABNORMAL HIGH (ref 4.0–10.5)

## 2015-07-18 LAB — URINE MICROSCOPIC-ADD ON

## 2015-07-18 LAB — TROPONIN I
TROPONIN I: 0.03 ng/mL (ref <0.031)
Troponin I: 0.03 ng/mL (ref <0.031)
Troponin I: 0.04 ng/mL — ABNORMAL HIGH (ref <0.031)

## 2015-07-18 LAB — I-STAT TROPONIN, ED: Troponin i, poc: 0.02 ng/mL (ref 0.00–0.08)

## 2015-07-18 LAB — PHOSPHORUS: Phosphorus: 2.5 mg/dL (ref 2.5–4.6)

## 2015-07-18 LAB — CORTISOL-AM, BLOOD: Cortisol - AM: 22.8 ug/dL — ABNORMAL HIGH (ref 6.7–22.6)

## 2015-07-18 LAB — MRSA PCR SCREENING: MRSA by PCR: NEGATIVE

## 2015-07-18 LAB — SODIUM, URINE, RANDOM

## 2015-07-18 LAB — MAGNESIUM: Magnesium: 2.2 mg/dL (ref 1.7–2.4)

## 2015-07-18 LAB — TSH: TSH: 2.054 u[IU]/mL (ref 0.350–4.500)

## 2015-07-18 LAB — CREATININE, URINE, RANDOM: CREATININE, URINE: 137.17 mg/dL

## 2015-07-18 LAB — OSMOLALITY, URINE: Osmolality, Ur: 346 mOsm/kg (ref 300–900)

## 2015-07-18 MED ORDER — HYDROCODONE-ACETAMINOPHEN 5-325 MG PO TABS
1.0000 | ORAL_TABLET | ORAL | Status: DC | PRN
  Administered 2015-07-20 – 2015-07-23 (×9): 2 via ORAL
  Filled 2015-07-18 (×9): qty 2

## 2015-07-18 MED ORDER — ENOXAPARIN SODIUM 30 MG/0.3ML ~~LOC~~ SOLN
30.0000 mg | SUBCUTANEOUS | Status: DC
  Administered 2015-07-18: 30 mg via SUBCUTANEOUS
  Filled 2015-07-18 (×2): qty 0.3

## 2015-07-18 MED ORDER — ONDANSETRON HCL 4 MG/2ML IJ SOLN
4.0000 mg | Freq: Four times a day (QID) | INTRAMUSCULAR | Status: DC | PRN
Start: 2015-07-18 — End: 2015-07-20
  Administered 2015-07-20: 4 mg via INTRAVENOUS
  Filled 2015-07-18: qty 2

## 2015-07-18 MED ORDER — SODIUM CHLORIDE 0.9 % IV SOLN
INTRAVENOUS | Status: DC
  Administered 2015-07-18: 01:00:00 via INTRAVENOUS

## 2015-07-18 MED ORDER — POTASSIUM CHLORIDE 10 MEQ/100ML IV SOLN
10.0000 meq | INTRAVENOUS | Status: AC
  Administered 2015-07-18 (×3): 10 meq via INTRAVENOUS
  Filled 2015-07-18 (×3): qty 100

## 2015-07-18 MED ORDER — ONDANSETRON HCL 4 MG PO TABS: 4.0000 mg | ORAL_TABLET | Freq: Four times a day (QID) | ORAL | Status: DC | PRN

## 2015-07-18 MED ORDER — SODIUM CHLORIDE 0.9 % IV BOLUS (SEPSIS)
500.0000 mL | Freq: Once | INTRAVENOUS | Status: AC
  Administered 2015-07-18: 500 mL via INTRAVENOUS

## 2015-07-18 MED ORDER — PNEUMOCOCCAL VAC POLYVALENT 25 MCG/0.5ML IJ INJ
0.5000 mL | INJECTION | INTRAMUSCULAR | Status: AC
  Administered 2015-07-19: 0.5 mL via INTRAMUSCULAR
  Filled 2015-07-18: qty 0.5

## 2015-07-18 MED ORDER — ACETAMINOPHEN 325 MG PO TABS: 650.0000 mg | ORAL_TABLET | Freq: Four times a day (QID) | ORAL | Status: DC | PRN

## 2015-07-18 MED ORDER — POTASSIUM CHLORIDE IN NACL 40-0.9 MEQ/L-% IV SOLN
INTRAVENOUS | Status: DC
  Administered 2015-07-18: 75 mL/h via INTRAVENOUS
  Filled 2015-07-18 (×3): qty 1000

## 2015-07-18 MED ORDER — POTASSIUM CHLORIDE CRYS ER 20 MEQ PO TBCR
40.0000 meq | EXTENDED_RELEASE_TABLET | ORAL | Status: AC
  Administered 2015-07-18 (×3): 40 meq via ORAL
  Filled 2015-07-18 (×3): qty 2

## 2015-07-18 MED ORDER — MAGNESIUM CHLORIDE 64 MG PO TBEC
1.0000 | DELAYED_RELEASE_TABLET | Freq: Two times a day (BID) | ORAL | Status: DC
  Administered 2015-07-18 – 2015-07-23 (×11): 64 mg via ORAL
  Filled 2015-07-18 (×12): qty 1

## 2015-07-18 MED ORDER — POTASSIUM CHLORIDE CRYS ER 20 MEQ PO TBCR
40.0000 meq | EXTENDED_RELEASE_TABLET | ORAL | Status: AC
  Administered 2015-07-18: 30 meq via ORAL
  Administered 2015-07-18: 40 meq via ORAL
  Administered 2015-07-18: 10 meq via ORAL
  Administered 2015-07-19: 40 meq via ORAL
  Filled 2015-07-18 (×2): qty 2

## 2015-07-18 MED ORDER — SODIUM CHLORIDE 0.9 % IJ SOLN
3.0000 mL | Freq: Two times a day (BID) | INTRAMUSCULAR | Status: DC
  Administered 2015-07-18 – 2015-07-22 (×10): 3 mL via INTRAVENOUS

## 2015-07-18 MED ORDER — PERFLUTREN LIPID MICROSPHERE
1.0000 mL | INTRAVENOUS | Status: AC | PRN
  Administered 2015-07-18: 2 mL via INTRAVENOUS
  Filled 2015-07-18: qty 10

## 2015-07-18 MED ORDER — POTASSIUM CHLORIDE 10 MEQ/100ML IV SOLN
10.0000 meq | INTRAVENOUS | Status: DC
  Administered 2015-07-18 (×2): 10 meq via INTRAVENOUS
  Filled 2015-07-18 (×4): qty 100

## 2015-07-18 MED ORDER — ACETAMINOPHEN 650 MG RE SUPP: 650.0000 mg | Freq: Four times a day (QID) | RECTAL | Status: DC | PRN

## 2015-07-18 NOTE — ED Notes (Signed)
Hearty healthy breakfast tray ordered @ 0615

## 2015-07-18 NOTE — ED Notes (Signed)
Echo tech at bedside.

## 2015-07-18 NOTE — Progress Notes (Signed)
Glucose has continued to raise per labs, notified T. Callohan.

## 2015-07-18 NOTE — ED Notes (Signed)
EDP aware sodium 118 and potassium 3.1

## 2015-07-18 NOTE — ED Notes (Signed)
Patient is resting comfortably. 

## 2015-07-18 NOTE — Progress Notes (Signed)
K+2.3, notified T. Collahan. Awaiting new orders.

## 2015-07-18 NOTE — ED Notes (Signed)
Pt requesting to sit in chair. Pt moved to recliner chair for comfort.

## 2015-07-18 NOTE — ED Notes (Signed)
Assisted Brooke, RN with assisting patient to a standing position so patient could use the urinal; patient was unsteady on his feet and had to sit before finishing; cleaned patient, applied fresh gown and cleaned up floor; Echo tech is now at bedside

## 2015-07-18 NOTE — Progress Notes (Signed)
CRITICAL VALUE ALERT  Critical value received:  KCL 2.4   Date of notification:  07/18/15  Time of notification:  1654  Critical value read back:Yes.    Nurse who received alert:  Raymon Mutton RN  MD notified (1st page):  M. Short PA  Time of first page:  1658  MD notified (2nd page):  Time of second page:  Responding MD:  M. Short   Time MD responded:  [phone]

## 2015-07-18 NOTE — Progress Notes (Signed)
  Echocardiogram 2D Echocardiogram has been performed.  Justin Bernard 07/18/2015, 12:30 PM

## 2015-07-18 NOTE — Progress Notes (Signed)
[facility] PROGRESS NOTE  Justin Bernard IWP:[id number] DOB: 1961/02/14 DOA: 07/17/2015 PCP: No PCP Per Patient  Brief Summary  54 yo M with history of systolic heart failure followed at [facility] and asthma who presented with lightheadedness. Also injured his right arm, "heard a pop" when trying to pull himself up in bed the day prior to admission.  He was found to be hyponatremic, hypokalemic, and hypotensive.    Assessment/Plan  Dehydration due to diuretics.  IVF, see below.    Hypokalemia, gradually resolving.  Continue oral potassium repletion.    Hyponatremia, improving with IVF -  Continue normal saline at 71ml/h -  Correcting at less than over 24 hours, so will continue current rate -  Once sodium has corrected, will stop IVF and change back to low sodium diet -  Start q4h neuro checks until sodium > 125  Chronic systolic heart failure, appears very dehydrated -  Continue IVF -  Continue to hold diuretics -  Family and patient would like diuretic regimen   Right arm fracture, continue sling and consult orthopedics once electrolytes and sodium corrected  Fall, likely due to orthostatic hypotensive  Diet:  regular Access:  PIV IVF:  yes Proph:  lovenox  Code Status: partial Family Communication:  Patient alone Disposition Plan:  Pending resolution of hypotension and electrolyte imbalance   Consultants:  none  Procedures:  none  Antibiotics:  none   HPI/Subjective:  Feeling better but still lightheaded when he stands up  Objective: Filed Vitals:   07/18/15 1130 07/18/15 1215 07/18/15 1300 07/18/15 1611  BP: 106/69 92/61 103/55 91/63  Pulse:  80 88 81  Temp:    97.8 F (36.6 C)  TempSrc:   Oral Oral  Resp:  14 23 19   Height:   6\' 4"  (1.93 m)   Weight:   126.4 kg (278 lb 10.6 oz)   SpO2:  94% 96% 97%    Intake/Output Summary (Last 24 hours) at 07/18/15 1748 Last data filed at 07/18/15 1119  Gross per 24 hour  Intake      0 ml   Output   1025 ml  Net  -1025 ml   Filed Weights   07/18/15 1300  Weight: 126.4 kg (278 lb 10.6 oz)   Body mass index is 33.93 kg/(m^2).  Exam:   General:  Adult male, No acute distress  HEENT:  NCAT, dry MMM, skin tenting  Cardiovascular:  RRR, nl S1, S2 no mrg, 2+ pulses, warm extremities  Respiratory:  CTAB, no increased WOB  Abdomen:   NABS, soft, NT/ND  MSK:   Normal tone and bulk, no LEE  Neuro:  Grossly intact  Data Reviewed: Basic Metabolic Panel:  Recent Labs Lab 07/17/15 1956 07/17/15 2228 07/18/15 0359 07/18/15 0719 07/18/15 1120 07/18/15 1606  NA 117* 116* 118* 118* 118* 121*  K <2.0* <2.0* 2.1* 2.2* 2.4* 2.4*  CL 66* 69* 76* 77* 78* 82*  CO2 36* 32 28 30 29 30   GLUCOSE 157* 231* 199* 167* 171* 229*  BUN 21* 22* 22* 19 21* 20  CREATININE 1.31* 1.28* 1.38* 1.11 1.09 1.15  CALCIUM 8.7* 8.2* 8.0* 7.9* 8.1* 8.0*  MG 2.3  --  2.2  --   --   --   PHOS  --   --  2.5  --   --   --    Liver Function Tests:  Recent Labs Lab 07/17/15 1956 07/18/15 0359  AST 155* 114*  ALT 61 48  ALKPHOS  120 104  BILITOT 2.2* 2.0*  PROT 5.7* 4.9*  ALBUMIN 2.8* 2.4*   No results for input(s): LIPASE, AMYLASE in the last 168 hours. No results for input(s): AMMONIA in the last 168 hours. CBC:  Recent Labs Lab 07/17/15 1956 07/18/15 0359  WBC 15.0* 13.3*  NEUTROABS 12.7*  --   HGB 13.6 11.8*  HCT 36.8* 32.8*  MCV 91.8 94.3  PLT 271 229    Recent Results (from the past 240 hour(s))  MRSA PCR Screening     Status: None   Collection Time: 07/18/15  2:27 PM  Result Value Ref Range Status   MRSA by PCR NEGATIVE NEGATIVE Final    Comment:        The GeneXpert MRSA Assay (FDA approved for NASAL specimens only), is one component of a comprehensive MRSA colonization surveillance program. It is not intended to diagnose MRSA infection nor to guide or monitor treatment for MRSA infections.      Studies: Dg Chest 1 View  07/17/2015  CLINICAL DATA:   54 year old male with hypotension. Medical history of CHF and asthma. EXAM: CHEST 1 VIEW COMPARISON:  Chest radiograph dated 12/04/2005 FINDINGS: Single frontal view of the chest does not demonstrate any focal consolidation, pleural effusion, or pneumothorax. Left lung base clear linear plaque likely atelectatic changes noted. The cardiac silhouette is within normal limits. Left pectoral AICD device. The osseous structures are grossly unremarkable. IMPRESSION: No active disease. Electronically Signed   By: Elgie Collard M.D.   On: 07/17/2015 20:54   Dg Shoulder Right  07/17/2015  CLINICAL DATA:  Right shoulder injury EXAM: RIGHT SHOULDER - 2+ VIEW COMPARISON:  None. FINDINGS: There is a comminuted right proximal humerus fracture involving the surgical neck, with mild apex lateral angulation, slight overriding of the fracture fragments and 1 shaft's with anterior displacement of the dominant distal fracture fragment. No dislocation at the right glenohumeral joint. 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 IMPRESSION: Comminuted, angulated and displaced proximal right humerus fracture involving the surgical neck. Electronically Signed   By: [name] M.D.   On: 07/17/2015 17:44    Scheduled Meds: . enoxaparin (LOVENOX) injection  30 mg Subcutaneous Q24H  . magnesium chloride  1 tablet Oral BID  . potassium chloride  40 mEq Oral Q4H  . sodium chloride  3 mL Intravenous Q12H   Continuous Infusions: . 0.9 % NaCl with KCl 40 mEq / L 75 mL/hr (07/18/15 0856)    Active Problems:   Hypokalemia   Orthostatic hypotension   Chronic systolic CHF (congestive heart failure), NYHA class 2 (HCC)   Dehydration   Hyponatremia   Fracture closed, humerus   Fall    Time spent: 30 min    Minnah Llamas, [facility]  [facility] Pager [phone]. If 7PM-7AM, please contact night-coverage at [email or web address], password [facility] 07/18/2015, 5:48 PM  LOS: 1 day

## 2015-07-18 NOTE — ED Notes (Signed)
Second breakfast tray ordered.

## 2015-07-19 ENCOUNTER — Inpatient Hospital Stay (HOSPITAL_COMMUNITY): Payer: Managed Care, Other (non HMO)

## 2015-07-19 DIAGNOSIS — S42301K Unspecified fracture of shaft of humerus, right arm, subsequent encounter for fracture with nonunion: Secondary | ICD-10-CM

## 2015-07-19 LAB — GLUCOSE, CAPILLARY
GLUCOSE-CAPILLARY: 191 mg/dL — AB (ref 65–99)
GLUCOSE-CAPILLARY: 148 mg/dL — AB (ref 65–99)
Glucose-Capillary: 164 mg/dL — ABNORMAL HIGH (ref 65–99)
Glucose-Capillary: 187 mg/dL — ABNORMAL HIGH (ref 65–99)

## 2015-07-19 LAB — BASIC METABOLIC PANEL
ANION GAP: 9 (ref 5–15)
ANION GAP: 8 (ref 5–15)
Anion gap: 10 (ref 5–15)
BUN: 16 mg/dL (ref 6–20)
BUN: 18 mg/dL (ref 6–20)
BUN: 19 mg/dL (ref 6–20)
CALCIUM: 8.2 mg/dL — AB (ref 8.9–10.3)
CHLORIDE: 91 mmol/L — AB (ref 101–111)
CO2: 25 mmol/L (ref 22–32)
CO2: 27 mmol/L (ref 22–32)
CO2: 28 mmol/L (ref 22–32)
CREATININE: 0.96 mg/dL (ref 0.61–1.24)
CREATININE: 1.05 mg/dL (ref 0.61–1.24)
Calcium: 8.2 mg/dL — ABNORMAL LOW (ref 8.9–10.3)
Calcium: 8.5 mg/dL — ABNORMAL LOW (ref 8.9–10.3)
Chloride: 86 mmol/L — ABNORMAL LOW (ref 101–111)
Chloride: 92 mmol/L — ABNORMAL LOW (ref 101–111)
Creatinine, Ser: 0.87 mg/dL (ref 0.61–1.24)
GFR calc non Af Amer: >60 mL/min (ref >60)
GFR calc non Af Amer: >60 mL/min (ref >60)
GLUCOSE: 167 mg/dL — AB (ref 65–99)
Glucose, Bld: 147 mg/dL — ABNORMAL HIGH (ref 65–99)
Glucose, Bld: 219 mg/dL — ABNORMAL HIGH (ref 65–99)
POTASSIUM: 3.4 mmol/L — AB (ref 3.5–5.1)
POTASSIUM: 3.5 mmol/L (ref 3.5–5.1)
Potassium: 2.8 mmol/L — ABNORMAL LOW (ref 3.5–5.1)
SODIUM: 123 mmol/L — AB (ref 135–145)
Sodium: 126 mmol/L — ABNORMAL LOW (ref 135–145)
Sodium: 127 mmol/L — ABNORMAL LOW (ref 135–145)

## 2015-07-19 LAB — URINE CULTURE: Culture: NO GROWTH

## 2015-07-19 MED ORDER — CHLORHEXIDINE GLUCONATE 4 % EX LIQD
60.0000 mL | Freq: Once | CUTANEOUS | Status: AC
  Administered 2015-07-20: 4 via TOPICAL
  Filled 2015-07-19: qty 60

## 2015-07-19 MED ORDER — POTASSIUM CHLORIDE CRYS ER 10 MEQ PO TBCR
EXTENDED_RELEASE_TABLET | ORAL | Status: AC
  Administered 2015-07-19: 40 meq via ORAL
  Filled 2015-07-19: qty 4

## 2015-07-19 MED ORDER — CLINDAMYCIN PHOSPHATE 900 MG/50ML IV SOLN
900.0000 mg | INTRAVENOUS | Status: AC
  Administered 2015-07-20: 900 mg via INTRAVENOUS
  Filled 2015-07-19: qty 50

## 2015-07-19 MED ORDER — POTASSIUM CHLORIDE CRYS ER 20 MEQ PO TBCR
40.0000 meq | EXTENDED_RELEASE_TABLET | Freq: Every day | ORAL | Status: DC
  Administered 2015-07-19 – 2015-07-20 (×2): 40 meq via ORAL
  Filled 2015-07-19 (×3): qty 2

## 2015-07-19 MED ORDER — ENOXAPARIN SODIUM 60 MG/0.6ML ~~LOC~~ SOLN
60.0000 mg | SUBCUTANEOUS | Status: DC
  Administered 2015-07-19 – 2015-07-22 (×4): 60 mg via SUBCUTANEOUS
  Filled 2015-07-19 (×4): qty 0.6

## 2015-07-19 MED ORDER — INSULIN ASPART 100 UNIT/ML ~~LOC~~ SOLN
0.0000 [IU] | Freq: Three times a day (TID) | SUBCUTANEOUS | Status: DC
  Administered 2015-07-19: 2 [IU] via SUBCUTANEOUS
  Administered 2015-07-19: 1 [IU] via SUBCUTANEOUS
  Administered 2015-07-21: 2 [IU] via SUBCUTANEOUS
  Administered 2015-07-22: 1 [IU] via SUBCUTANEOUS

## 2015-07-19 MED ORDER — ZOLPIDEM TARTRATE 5 MG PO TABS
5.0000 mg | ORAL_TABLET | Freq: Once | ORAL | Status: AC
  Administered 2015-07-19: 5 mg via ORAL
  Filled 2015-07-19: qty 1

## 2015-07-19 NOTE — Progress Notes (Signed)
[facility] PROGRESS NOTE  Justin Bernard YME:[id number] DOB: 10-15-1960 DOA: 07/17/2015 PCP: No PCP Per Patient  Brief Summary  54 yo M with history of systolic heart failure followed at [facility] and asthma who presented with lightheadedness. Also injured his right arm, "heard a pop" when trying to pull himself up in bed the day prior to admission.  He was found to be hyponatremic, hypokalemic, and hypotensive.    Assessment/Plan  Dehydration due to diuretics, resolving -  D/c IVF  -  Encourage PO -  Continue to hold diuretics  Hypokalemia, resolved -  Continue oral potassium to get potassium > 4 -  Continue magnesium supplementation  Hyponatremia, resolving gradually at < per 24 hours -  D/c IVF -  Encourage PO intake -  Continue to hold diuretics -  D/c neuro checks  Chronic systolic heart failure, appears very dehydrated.  EF 50-55% on current ECHO with mild LVH -  Pacer wire visualized in the right ventricle? -  Continue to hold diuretics -  Case discussed with cardiologists from [facility], recommend half of previous lasix dose and continuing epleronone once euvolemic -  Will need to sodium restrict diet once electrolytes normalized  Mild dyspnea overnight -  CXR clear and exam without rales -  Resolved spontaneously  Right humerus fracture, continue sling  -  Appreciate orthopedic assistance -  Plan for surgery tomorrow -  NPO at [place]  Fall, likely due to orthostatic hypotensive -  PT/OT evaluations after surgery  Diet:  regular Access:  PIV IVF:  off Proph:  lovenox  Code Status: partial Family Communication:  Patient alone Disposition Plan:  Pending resolution of hypotension and electrolyte imbalance   Consultants:  Orthopedic surgery  Procedures:  none  Antibiotics:  none   HPI/Subjective:  Feeling much better.  Thinking is clearer.  Concerned about his arm and about what dose of diuretic he will need to take  eventually  Objective: Filed Vitals:   07/19/15 0420 07/19/15 0527 07/19/15 0800 07/19/15 1200  BP: 96/57  96/56 94/61  Pulse: 91  92 96  Temp:   97.9 F (36.6 C) 98.1 F (36.7 C)  TempSrc:   Oral Oral  Resp: 16  16 16   Height:      Weight:  125.556 kg (276 lb 12.8 oz)    SpO2: 99%  94% 96%    Intake/Output Summary (Last 24 hours) at 07/19/15 1724 Last data filed at 07/19/15 1300  Gross per 24 hour  Intake 2227.5 ml  Output   2150 ml  Net   77.5 ml   Filed Weights   07/18/15 1300 07/19/15 0418 07/19/15 0527  Weight: 126.4 kg (278 lb 10.6 oz) 126.644 kg (279 lb 3.2 oz) 125.556 kg (276 lb 12.8 oz)   Body mass index is 33.71 kg/(m^2).  Exam:   General:  Adult male, No acute distress  HEENT:  NCAT, MMM, eyes no longer sunken  Cardiovascular:  RRR, nl S1, S2 no mrg, 2+ pulses, warm extremities  Respiratory:  CTAB, no increased WOB  Abdomen:   NABS, soft, NT/ND  MSK:   Normal tone and bulk, no LEE  Neuro:  Grossly intact  Data Reviewed: Basic Metabolic Panel:  Recent Labs Lab 07/17/15 1956  07/18/15 0359  07/18/15 1606 07/18/15 1911 07/18/15 2333 07/19/15 0408 07/19/15 1234  NA 117*  < > 118*  < > 121* 122* 123* 126* 127*  K <2.0*  < > 2.1*  < > 2.4* 2.3* 2.8* 3.4*  3.5  CL 66*  < > 76*  < > 82* 82* 86* 91* 92*  CO2 36*  < > 28  < > GLUCOSE 157*  < > 199*  < > 229* 234* 219* 147* 167*  BUN 21*  < > 22*  < > CREATININE 1.31*  < > 1.38*  < > 1.15 1.22 1.05 0.96 0.87  CALCIUM 8.7*  < > 8.0*  < > 8.0* 8.2* 8.2* 8.2* 8.5*  MG 2.3  --  2.2  --   --   --   --   --   --   PHOS  --   --  2.5  --   --   --   --   --   --   < > = values in this interval not displayed. Liver Function Tests:  Recent Labs Lab 07/17/15 1956 07/18/15 0359  AST 155* 114*  ALT 61 48  ALKPHOS 120 104  BILITOT 2.2* 2.0*  PROT 5.7* 4.9*  ALBUMIN 2.8* 2.4*   No results for input(s): LIPASE, AMYLASE in the last 168 hours. No results for input(s):  AMMONIA in the last 168 hours. CBC:  Recent Labs Lab 07/17/15 1956 07/18/15 0359  WBC 15.0* 13.3*  NEUTROABS 12.7*  --   HGB 13.6 11.8*  HCT 36.8* 32.8*  MCV 91.8 94.3  PLT 271 229    Recent Results (from the past 240 hour(s))  Urine culture     Status: None   Collection Time: 07/18/15  1:13 AM  Result Value Ref Range Status   Specimen Description URINE, CATHETERIZED  Final   Special Requests NONE  Final   Culture NO GROWTH 1 DAY  Final   Report Status 07/19/2015 FINAL  Final  MRSA PCR Screening     Status: None   Collection Time: 07/18/15  2:27 PM  Result Value Ref Range Status   MRSA by PCR NEGATIVE NEGATIVE Final    Comment:        The GeneXpert MRSA Assay (FDA approved for NASAL specimens only), is one component of a comprehensive MRSA colonization surveillance program. It is not intended to diagnose MRSA infection nor to guide or monitor treatment for MRSA infections.      Studies: Dg Chest 1 View  07/17/2015  CLINICAL DATA:  54 year old male with hypotension. Medical history of CHF and asthma. EXAM: CHEST 1 VIEW COMPARISON:  Chest radiograph dated 12/04/2005 FINDINGS: Single frontal view of the chest does not demonstrate any focal consolidation, pleural effusion, or pneumothorax. Left lung base clear linear plaque likely atelectatic changes noted. The cardiac silhouette is within normal limits. Left pectoral AICD device. The osseous structures are grossly unremarkable. IMPRESSION: No active disease. Electronically Signed   By: Elgie Collard M.D.   On: 07/17/2015 20:54   Dg Shoulder Right  07/17/2015  CLINICAL DATA:  Right shoulder injury EXAM: RIGHT SHOULDER - 2+ VIEW COMPARISON:  None. FINDINGS: There is a comminuted right proximal humerus fracture involving the surgical neck, with mild apex lateral angulation, slight overriding of the fracture fragments and 1 shaft's with anterior displacement of the dominant distal fracture fragment. No dislocation at the  right glenohumeral joint. 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 IMPRESSION: Comminuted, angulated and displaced proximal right humerus fracture involving the surgical neck. Electronically Signed   By: [name] M.D.   On: 07/17/2015 17:44   Dg Chest Port 1 View  07/19/2015  CLINICAL DATA:  Productive cough EXAM: PORTABLE CHEST 1 VIEW COMPARISON:  Chest x-rays dated 07/17/2015 and 12/04/2005. FINDINGS: Heart size is upper normal, stable. Overall cardiomediastinal silhouette remains normal in size and configuration. Left chest wall pacemaker/AICD is stable in position. Lungs are clear. No pleural effusion seen. No pneumothorax seen. Osseous structures about the chest are unremarkable. IMPRESSION: Stable chest x-ray. No evidence of acute cardiopulmonary abnormality. Lungs are clear. Electronically Signed   By: Bary Richard M.D.   On: 07/19/2015 08:14    Scheduled Meds: . enoxaparin (LOVENOX) injection  60 mg Subcutaneous Q24H  . insulin aspart  0-9 Units Subcutaneous TID WC  . magnesium chloride  1 tablet Oral BID  . potassium chloride  40 mEq Oral Daily  . sodium chloride  3 mL Intravenous Q12H   Continuous Infusions:    Active Problems:   Hypokalemia   Orthostatic hypotension   Chronic systolic CHF (congestive heart failure), NYHA class 2 (HCC)   Dehydration   Hyponatremia   Fracture closed, humerus   Fall    Time spent: 30 min    Katurah Karapetian, [facility]  [facility] Pager [phone]. If 7PM-7AM, please contact night-coverage at [email or web address], password [facility] 07/19/2015, 5:24 PM  LOS: 2 days

## 2015-07-19 NOTE — Consult Note (Signed)
Justin Bernard is an 54 y.o. male.    Chief Complaint: right shoulder pain  HPI: 54 y/o male with recent history of a "pop" in his right shoulder several days ago. Pt admitted with light headedness, hyponatremia, and hypokalemia. Pt denies any falls but states he pulled with the right arm and felt a pop. Possible fall but pt denies. C/o moderate pain to right shoulder and inability to use the right arm very well. Denies previous problems with the right shoulder in the past. Currently being medically optimized for surgery.  PCP:  No PCP Per Patient  PMH: Past Medical History  Diagnosis Date  . CHF (congestive heart failure) ([place])   . Asthma     PSH: Past Surgical History  Procedure Laterality Date  . Back surgery      Social History:  reports that he has never smoked. He has never used smokeless tobacco. He reports that he does not drink alcohol or use illicit drugs.  Allergies:  Allergies  Allergen Reactions  . Penicillins Other (See Comments)    Possible childhood reaction    Medications: Current Facility-Administered Medications  Medication Dose Route Frequency Provider Last Rate Last Dose  . acetaminophen (TYLENOL) tablet 650 mg  650 mg Oral Q6H PRN Toy Baker, MD       Or  . acetaminophen (TYLENOL) suppository 650 mg  650 mg Rectal Q6H PRN Toy Baker, MD      . enoxaparin (LOVENOX) injection 60 mg  60 mg Subcutaneous Q24H Franky Macho, RPH      . HYDROcodone-acetaminophen (NORCO/VICODIN) 5-325 MG per tablet 1-2 tablet  1-2 tablet Oral Q4H PRN Toy Baker, MD      . insulin aspart (novoLOG) injection 0-9 Units  0-9 Units Subcutaneous TID WC Janece Canterbury, MD      . magnesium chloride (SLOW-MAG) 64 MG SR tablet 64 mg  1 tablet Oral BID Janece Canterbury, MD   64 mg at 07/19/15 1135  . ondansetron (ZOFRAN) tablet 4 mg  4 mg Oral Q6H PRN Toy Baker, MD       Or  . ondansetron (ZOFRAN) injection 4 mg  4 mg Intravenous Q6H PRN Toy Baker, MD      . pneumococcal 23 valent vaccine (PNU-IMMUNE) injection 0.5 mL  0.5 mL Intramuscular Tomorrow-1000 Janece Canterbury, MD      . sodium chloride 0.9 % injection 3 mL  3 mL Intravenous Q12H Toy Baker, MD   3 mL at 07/18/15 2253    Results for orders placed or performed during the hospital encounter of 07/17/15 (from the past 48 hour(s))  CBC with Differential     Status: Abnormal   Collection Time: 07/17/15  7:56 PM  Result Value Ref Range   WBC 15.0 (H) 4.0 - 10.5 K/uL   RBC 4.01 (L) 4.22 - 5.81 MIL/uL   Hemoglobin 13.6 13.0 - 17.0 g/dL   HCT 36.8 (L) 39.0 - 52.0 %   MCV 91.8 78.0 - 100.0 fL   MCH 33.9 26.0 - 34.0 pg   MCHC 37.0 (H) 30.0 - 36.0 g/dL   RDW 12.6 11.5 - 15.5 %   Platelets 271 150 - 400 K/uL   Neutrophils Relative % 85 %   Neutro Abs 12.7 (H) 1.7 - 7.7 K/uL   Lymphocytes Relative 11 %   Lymphs Abs 1.6 0.7 - 4.0 K/uL   Monocytes Relative 5 %   Monocytes Absolute 0.7 0.1 - 1.0 K/uL   Eosinophils Relative 0 %  Eosinophils Absolute 0.0 0.0 - 0.7 K/uL   Basophils Relative 0 %   Basophils Absolute 0.0 0.0 - 0.1 K/uL  Comprehensive metabolic panel     Status: Abnormal   Collection Time: 07/17/15  7:56 PM  Result Value Ref Range   Sodium 117 (LL) 135 - 145 mmol/L    Comment: REPEATED TO VERIFY CRITICAL RESULT CALLED TO, READ BACK BY AND VERIFIED WITH: C CHRISCOE,RN 2037 07/17/2015 WBOND    Potassium <2.0 (LL) 3.5 - 5.1 mmol/L    Comment: REPEATED TO VERIFY CRITICAL RESULT CALLED TO, READ BACK BY AND VERIFIED WITH: C CHRISCOE,RN 2037 07/17/2015 WBOND    Chloride 66 (L) 101 - 111 mmol/L   CO2 36 (H) 22 - 32 mmol/L   Glucose, Bld 157 (H) 65 - 99 mg/dL   BUN 21 (H) 6 - 20 mg/dL   Creatinine, Ser 1.31 (H) 0.61 - 1.24 mg/dL   Calcium 8.7 (L) 8.9 - 10.3 mg/dL   Total Protein 5.7 (L) 6.5 - 8.1 g/dL   Albumin 2.8 (L) 3.5 - 5.0 g/dL   AST 155 (H) 15 - 41 U/L   ALT 61 17 - 63 U/L   Alkaline Phosphatase 120 38 - 126 U/L   Total Bilirubin 2.2 (H)  0.3 - 1.2 mg/dL   GFR calc non Af Amer >60 >60 mL/min   GFR calc Af Amer >60 >60 mL/min    Comment: (NOTE) The eGFR has been calculated using the CKD EPI equation. This calculation has not been validated in all clinical situations. eGFR's persistently <60 mL/min signify possible Chronic Kidney Disease.    Anion gap 15 5 - 15  Brain natriuretic peptide     Status: None   Collection Time: 07/17/15  7:56 PM  Result Value Ref Range   B Natriuretic Peptide 63.4 0.0 - 100.0 pg/mL  Magnesium     Status: None   Collection Time: 07/17/15  7:56 PM  Result Value Ref Range   Magnesium 2.3 1.7 - 2.4 mg/dL  I-Stat Troponin, ED - 0, 3, 6 hours (not at [facility])     Status: None   Collection Time: 07/17/15  7:58 PM  Result Value Ref Range   Troponin i, poc 0.01 0.00 - 0.08 ng/mL   Comment 3            Comment: Due to the release kinetics of cTnI, a negative result within the first hours of the onset of symptoms does not rule out myocardial infarction with certainty. If myocardial infarction is still suspected, repeat the test at appropriate intervals.   Basic metabolic panel     Status: Abnormal   Collection Time: 07/17/15 10:28 PM  Result Value Ref Range   Sodium 116 (LL) 135 - 145 mmol/L    Comment: CRITICAL RESULT CALLED TO, READ BACK BY AND VERIFIED WITH: HUGHES,C RN 07/17/2015 2337 JORDANS CONSISTENT WITH PREVIOUS RESULT    Potassium <2.0 (LL) 3.5 - 5.1 mmol/L    Comment: CRITICAL RESULT CALLED TO, READ BACK BY AND VERIFIED WITH: HUGHES,C RN 07/17/2015 2337 JORDANS CONSISTENT WITH PREVIOUS RESULT    Chloride 69 (L) 101 - 111 mmol/L   CO2 32 22 - 32 mmol/L   Glucose, Bld 231 (H) 65 - 99 mg/dL   BUN 22 (H) 6 - 20 mg/dL   Creatinine, Ser 1.28 (H) 0.61 - 1.24 mg/dL   Calcium 8.2 (L) 8.9 - 10.3 mg/dL   GFR calc non Af Amer >60 >60 mL/min   GFR calc Af  Amer >60 >60 mL/min    Comment: (NOTE) The eGFR has been calculated using the CKD EPI equation. This calculation has not been validated  in all clinical situations. eGFR's persistently <60 mL/min signify possible Chronic Kidney Disease.    Anion gap 15 5 - 15  I-Stat Troponin, ED - 0, 3, 6 hours (not at [facility])     Status: None   Collection Time: 07/17/15 10:30 PM  Result Value Ref Range   Troponin i, poc 0.04 0.00 - 0.08 ng/mL   Comment 3            Comment: Due to the release kinetics of cTnI, a negative result within the first hours of the onset of symptoms does not rule out myocardial infarction with certainty. If myocardial infarction is still suspected, repeat the test at appropriate intervals.   Urinalysis, Routine w reflex microscopic (not at [facility])     Status: Abnormal   Collection Time: 07/18/15  1:13 AM  Result Value Ref Range   Color, Urine AMBER (A) YELLOW    Comment: BIOCHEMICALS MAY BE AFFECTED BY COLOR   APPearance CLOUDY (A) CLEAR   Specific Gravity, Urine 1.016 1.005 - 1.030   pH 6.5 5.0 - 8.0   Glucose, UA NEGATIVE NEGATIVE mg/dL   Hgb urine dipstick LARGE (A) NEGATIVE   Bilirubin Urine SMALL (A) NEGATIVE   Ketones, ur 15 (A) NEGATIVE mg/dL   Protein, ur 30 (A) NEGATIVE mg/dL   Nitrite NEGATIVE NEGATIVE   Leukocytes, UA SMALL (A) NEGATIVE  Urine culture     Status: None   Collection Time: 07/18/15  1:13 AM  Result Value Ref Range   Specimen Description URINE, CATHETERIZED    Special Requests NONE    Culture NO GROWTH 1 DAY    Report Status 07/19/2015 FINAL   Urine microscopic-add on     Status: Abnormal   Collection Time: 07/18/15  1:13 AM  Result Value Ref Range   Squamous Epithelial / LPF 0-5 (A) NONE SEEN   WBC, UA 6-30 0 - 5 WBC/hpf   RBC / HPF TOO NUMEROUS TO COUNT 0 - 5 RBC/hpf   Bacteria, UA RARE (A) NONE SEEN   Casts HYALINE CASTS (A) NEGATIVE  Creatinine, urine, random     Status: None   Collection Time: 07/18/15  1:14 AM  Result Value Ref Range   Creatinine, Urine 137.17 mg/dL  Sodium, urine, random     Status: None   Collection Time: 07/18/15  1:14 AM  Result Value Ref  Range   Sodium, Ur <10 mmol/L  Osmolality, urine     Status: None   Collection Time: 07/18/15  1:14 AM  Result Value Ref Range   Osmolality, Ur 346 300 - 900 mOsm/kg    Comment: Please note change in reference range.  I-Stat Troponin, ED - 0, 3, 6 hours (not at [facility])     Status: None   Collection Time: 07/18/15  1:29 AM  Result Value Ref Range   Troponin i, poc 0.02 0.00 - 0.08 ng/mL   Comment 3            Comment: Due to the release kinetics of cTnI, a negative result within the first hours of the onset of symptoms does not rule out myocardial infarction with certainty. If myocardial infarction is still suspected, repeat the test at appropriate intervals.   Urinalysis, Routine w reflex microscopic (not at [facility])     Status: Abnormal   Collection Time: 07/18/15  3:51  AM  Result Value Ref Range   Color, Urine YELLOW YELLOW   APPearance CLOUDY (A) CLEAR   Specific Gravity, Urine 1.007 1.005 - 1.030   pH 6.5 5.0 - 8.0   Glucose, UA NEGATIVE NEGATIVE mg/dL   Hgb urine dipstick LARGE (A) NEGATIVE   Bilirubin Urine NEGATIVE NEGATIVE   Ketones, ur NEGATIVE NEGATIVE mg/dL   Protein, ur NEGATIVE NEGATIVE mg/dL   Nitrite NEGATIVE NEGATIVE   Leukocytes, UA MODERATE (A) NEGATIVE  Urine microscopic-add on     Status: Abnormal   Collection Time: 07/18/15  3:51 AM  Result Value Ref Range   Squamous Epithelial / LPF 0-5 (A) NONE SEEN   WBC, UA 6-30 0 - 5 WBC/hpf   RBC / HPF 6-30 0 - 5 RBC/hpf   Bacteria, UA FEW (A) NONE SEEN  Troponin I     Status: None   Collection Time: 07/18/15  3:53 AM  Result Value Ref Range   Troponin I 0.03 <0.031 ng/mL    Comment:        NO INDICATION OF MYOCARDIAL INJURY.   Magnesium     Status: None   Collection Time: 07/18/15  3:59 AM  Result Value Ref Range   Magnesium 2.2 1.7 - 2.4 mg/dL  Phosphorus     Status: None   Collection Time: 07/18/15  3:59 AM  Result Value Ref Range   Phosphorus 2.5 2.5 - 4.6 mg/dL  Comprehensive metabolic panel      Status: Abnormal   Collection Time: 07/18/15  3:59 AM  Result Value Ref Range   Sodium 118 (LL) 135 - 145 mmol/L    Comment: CRITICAL RESULT CALLED TO, READ BACK BY AND VERIFIED WITH: HESTER,B RN 07/18/2015 0451 JORDANS    Potassium 2.1 (LL) 3.5 - 5.1 mmol/L    Comment: CRITICAL RESULT CALLED TO, READ BACK BY AND VERIFIED WITH: HESTER,B RN 07/18/2015 0451 JORDANS    Chloride 76 (L) 101 - 111 mmol/L   CO2 28 22 - 32 mmol/L   Glucose, Bld 199 (H) 65 - 99 mg/dL   BUN 22 (H) 6 - 20 mg/dL   Creatinine, Ser 1.38 (H) 0.61 - 1.24 mg/dL   Calcium 8.0 (L) 8.9 - 10.3 mg/dL   Total Protein 4.9 (L) 6.5 - 8.1 g/dL   Albumin 2.4 (L) 3.5 - 5.0 g/dL   AST 114 (H) 15 - 41 U/L   ALT 48 17 - 63 U/L   Alkaline Phosphatase 104 38 - 126 U/L   Total Bilirubin 2.0 (H) 0.3 - 1.2 mg/dL   GFR calc non Af Amer 57 (L) >60 mL/min   GFR calc Af Amer >60 >60 mL/min    Comment: (NOTE) The eGFR has been calculated using the CKD EPI equation. This calculation has not been validated in all clinical situations. eGFR's persistently <60 mL/min signify possible Chronic Kidney Disease.    Anion gap 14 5 - 15  CBC     Status: Abnormal   Collection Time: 07/18/15  3:59 AM  Result Value Ref Range   WBC 13.3 (H) 4.0 - 10.5 K/uL   RBC 3.48 (L) 4.22 - 5.81 MIL/uL   Hemoglobin 11.8 (L) 13.0 - 17.0 g/dL   HCT 32.8 (L) 39.0 - 52.0 %   MCV 94.3 78.0 - 100.0 fL   MCH 33.9 26.0 - 34.0 pg   MCHC 36.0 30.0 - 36.0 g/dL   RDW 12.7 11.5 - 15.5 %   Platelets 229 150 - 400 K/uL  TSH  Status: None   Collection Time: 07/18/15  4:01 AM  Result Value Ref Range   TSH 2.054 0.350 - 4.500 uIU/mL  Basic metabolic panel     Status: Abnormal   Collection Time: 07/18/15  7:19 AM  Result Value Ref Range   Sodium 118 (LL) 135 - 145 mmol/L    Comment: CRITICAL RESULT CALLED TO, READ BACK BY AND VERIFIED WITH: HESTERBRN [id number] MCCAULEG    Potassium 2.2 (LL) 3.5 - 5.1 mmol/L    Comment: CRITICAL RESULT CALLED TO, READ BACK BY  AND VERIFIED WITH: HESTERBRN 0808 [id number] MCCAULEG    Chloride 77 (L) 101 - 111 mmol/L   CO2 30 22 - 32 mmol/L   Glucose, Bld 167 (H) 65 - 99 mg/dL   BUN 19 6 - 20 mg/dL   Creatinine, Ser 1.11 0.61 - 1.24 mg/dL   Calcium 7.9 (L) 8.9 - 10.3 mg/dL   GFR calc non Af Amer >60 >60 mL/min   GFR calc Af Amer >60 >60 mL/min    Comment: (NOTE) The eGFR has been calculated using the CKD EPI equation. This calculation has not been validated in all clinical situations. eGFR's persistently <60 mL/min signify possible Chronic Kidney Disease.    Anion gap 11 5 - 15  Troponin I     Status: Abnormal   Collection Time: 07/18/15  9:40 AM  Result Value Ref Range   Troponin I 0.04 (H) <0.031 ng/mL    Comment:        PERSISTENTLY INCREASED TROPONIN VALUES IN THE RANGE OF 0.04-0.49 ng/mL CAN BE SEEN IN:       -UNSTABLE ANGINA       -CONGESTIVE HEART FAILURE       -MYOCARDITIS       -CHEST TRAUMA       -ARRYHTHMIAS       -LATE PRESENTING MYOCARDIAL INFARCTION       -COPD   CLINICAL FOLLOW-UP RECOMMENDED.   Cortisol-am, blood     Status: Abnormal   Collection Time: 07/18/15  9:40 AM  Result Value Ref Range   Cortisol - AM 22.8 (H) 6.7 - 22.6 ug/dL  Basic metabolic panel     Status: Abnormal   Collection Time: 07/18/15 11:20 AM  Result Value Ref Range   Sodium 118 (LL) 135 - 145 mmol/L    Comment: CRITICAL RESULT CALLED TO, READ BACK BY AND VERIFIED WITH: RN HESTER,B AT 1202 [id number] MARTINB    Potassium 2.4 (LL) 3.5 - 5.1 mmol/L    Comment: CRITICAL RESULT CALLED TO, READ BACK BY AND VERIFIED WITH: RN HESTER,B AT 1202 [id number] MARTINB    Chloride 78 (L) 101 - 111 mmol/L   CO2 29 22 - 32 mmol/L   Glucose, Bld 171 (H) 65 - 99 mg/dL   BUN 21 (H) 6 - 20 mg/dL   Creatinine, Ser 1.09 0.61 - 1.24 mg/dL   Calcium 8.1 (L) 8.9 - 10.3 mg/dL   GFR calc non Af Amer >60 >60 mL/min   GFR calc Af Amer >60 >60 mL/min    Comment: (NOTE) The eGFR has been calculated using the CKD EPI equation. This  calculation has not been validated in all clinical situations. eGFR's persistently <60 mL/min signify possible Chronic Kidney Disease.    Anion gap 11 5 - 15  MRSA PCR Screening     Status: None   Collection Time: 07/18/15  2:27 PM  Result Value Ref Range   MRSA by PCR NEGATIVE NEGATIVE  Comment:        The GeneXpert MRSA Assay (FDA approved for NASAL specimens only), is one component of a comprehensive MRSA colonization surveillance program. It is not intended to diagnose MRSA infection nor to guide or monitor treatment for MRSA infections.   Basic metabolic panel     Status: Abnormal   Collection Time: 07/18/15  4:06 PM  Result Value Ref Range   Sodium 121 (L) 135 - 145 mmol/L   Potassium 2.4 (LL) 3.5 - 5.1 mmol/L    Comment: CRITICAL RESULT CALLED TO, READ BACK BY AND VERIFIED WITH: [facility] RN 07/18/15 1655 WOOTEN,K    Chloride 82 (L) 101 - 111 mmol/L   CO2 30 22 - 32 mmol/L   Glucose, Bld 229 (H) 65 - 99 mg/dL   BUN 20 6 - 20 mg/dL   Creatinine, Ser 1.15 0.61 - 1.24 mg/dL   Calcium 8.0 (L) 8.9 - 10.3 mg/dL   GFR calc non Af Amer >60 >60 mL/min   GFR calc Af Amer >60 >60 mL/min    Comment: (NOTE) The eGFR has been calculated using the CKD EPI equation. This calculation has not been validated in all clinical situations. eGFR's persistently <60 mL/min signify possible Chronic Kidney Disease.    Anion gap 9 5 - 15  Troponin I     Status: None   Collection Time: 07/18/15  4:06 PM  Result Value Ref Range   Troponin I 0.03 <0.031 ng/mL    Comment:        NO INDICATION OF MYOCARDIAL INJURY.   Basic metabolic panel     Status: Abnormal   Collection Time: 07/18/15  7:11 PM  Result Value Ref Range   Sodium 122 (L) 135 - 145 mmol/L   Potassium 2.3 (LL) 3.5 - 5.1 mmol/L    Comment: CRITICAL RESULT CALLED TO, READ BACK BY AND VERIFIED WITH: RN [facility] AT 1950 [id number] MARTINB    Chloride 82 (L) 101 - 111 mmol/L   CO2 29 22 - 32 mmol/L   Glucose, Bld 234 (H)  65 - 99 mg/dL   BUN 19 6 - 20 mg/dL   Creatinine, Ser 1.22 0.61 - 1.24 mg/dL   Calcium 8.2 (L) 8.9 - 10.3 mg/dL   GFR calc non Af Amer >60 >60 mL/min   GFR calc Af Amer >60 >60 mL/min    Comment: (NOTE) The eGFR has been calculated using the CKD EPI equation. This calculation has not been validated in all clinical situations. eGFR's persistently <60 mL/min signify possible Chronic Kidney Disease.    Anion gap 11 5 - 15  Basic metabolic panel     Status: Abnormal   Collection Time: 07/18/15 11:33 PM  Result Value Ref Range   Sodium 123 (L) 135 - 145 mmol/L   Potassium 2.8 (L) 3.5 - 5.1 mmol/L    Comment: DELTA CHECK NOTED   Chloride 86 (L) 101 - 111 mmol/L   CO2 28 22 - 32 mmol/L   Glucose, Bld 219 (H) 65 - 99 mg/dL   BUN 19 6 - 20 mg/dL   Creatinine, Ser 1.05 0.61 - 1.24 mg/dL   Calcium 8.2 (L) 8.9 - 10.3 mg/dL   GFR calc non Af Amer >60 >60 mL/min   GFR calc Af Amer >60 >60 mL/min    Comment: (NOTE) The eGFR has been calculated using the CKD EPI equation. This calculation has not been validated in all clinical situations. eGFR's persistently <60 mL/min signify possible Chronic Kidney Disease.  Anion gap 9 5 - 15  Basic metabolic panel     Status: Abnormal   Collection Time: 07/19/15  4:08 AM  Result Value Ref Range   Sodium 126 (L) 135 - 145 mmol/L   Potassium 3.4 (L) 3.5 - 5.1 mmol/L    Comment: DELTA CHECK NOTED   Chloride 91 (L) 101 - 111 mmol/L   CO2 25 22 - 32 mmol/L   Glucose, Bld 147 (H) 65 - 99 mg/dL   BUN 18 6 - 20 mg/dL   Creatinine, Ser 0.96 0.61 - 1.24 mg/dL   Calcium 8.2 (L) 8.9 - 10.3 mg/dL   GFR calc non Af Amer >60 >60 mL/min   GFR calc Af Amer >60 >60 mL/min    Comment: (NOTE) The eGFR has been calculated using the CKD EPI equation. This calculation has not been validated in all clinical situations. eGFR's persistently <60 mL/min signify possible Chronic Kidney Disease.    Anion gap 10 5 - 15  Glucose, capillary     Status: Abnormal    Collection Time: 07/19/15  8:03 AM  Result Value Ref Range   Glucose-Capillary 164 (H) 65 - 99 mg/dL   Comment 1 Notify RN    Dg Chest 1 View  07/17/2015  CLINICAL DATA:  54 year old male with hypotension. Medical history of CHF and asthma. EXAM: CHEST 1 VIEW COMPARISON:  Chest radiograph dated 12/04/2005 FINDINGS: Single frontal view of the chest does not demonstrate any focal consolidation, pleural effusion, or pneumothorax. Left lung base clear linear plaque likely atelectatic changes noted. The cardiac silhouette is within normal limits. Left pectoral AICD device. The osseous structures are grossly unremarkable. IMPRESSION: No active disease. Electronically Signed   By: Anner Crete M.D.   On: 07/17/2015 20:54   Dg Shoulder Right  07/17/2015  CLINICAL DATA:  Right shoulder injury EXAM: RIGHT SHOULDER - 2+ VIEW COMPARISON:  None. FINDINGS: There is a comminuted right proximal humerus fracture involving the surgical neck, with mild apex lateral angulation, slight overriding of the fracture fragments and 1 shaft's with anterior displacement of the dominant distal fracture fragment. No dislocation at the right glenohumeral joint. 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 IMPRESSION: Comminuted, angulated and displaced proximal right humerus fracture involving the surgical neck. Electronically Signed   By: Ilona Sorrel M.D.   On: 07/17/2015 17:44   Dg Chest Port 1 View  07/19/2015  CLINICAL DATA:  Productive cough EXAM: PORTABLE CHEST 1 VIEW COMPARISON:  Chest x-rays dated 07/17/2015 and 12/04/2005. FINDINGS: Heart size is upper normal, stable. Overall cardiomediastinal silhouette remains normal in size and configuration. Left chest wall pacemaker/AICD is stable in position. Lungs are clear. No pleural effusion seen. No pneumothorax seen. Osseous structures about the chest are unremarkable. IMPRESSION: Stable chest x-ray. No evidence of acute cardiopulmonary abnormality. Lungs are  clear. Electronically Signed   By: Franki Cabot M.D.   On: 07/19/2015 08:14    ROS: ROS Inability to raise or use right upper extremity due to pain Otherwise ros negative  Physical Exam: Alert and appropriate 54 y/o male in no acute distress Cervical spine with full rom and no tenderness Left upper extremity with full rom, no deformity Right upper extremity: mild edema No attempt at rom of right upper extremity due to known fracture nv intact distally   Physical Exam   Assessment/Plan Assessment: right proximal humerus fracture with complete displacement  Plan: Discussed with pt and family that due to the amount of displacement of the fracture surgery  is needed to correct and improve function of the right upper extremity Pt and family in agreement Will plan for surgery tomorrow afternoon NPO after midnight Sling as needed for comfort Pain management

## 2015-07-19 NOTE — Progress Notes (Signed)
K+ 2.8, lab draw done while 2nd KCL bolus running. Still has 3rd bolus to still run. Notified T. Claiborne Billings.

## 2015-07-19 NOTE — Progress Notes (Signed)
Patient BP 88/48, complaining of chest feeling "congested" and has a productive cough. Has been feeling dizzy at times. States "I feel like I need my lasix" I explained that lasix is on hold because of his low potassium. Patients lungs clear on auscultation. Due to patient's symptoms at this time we obtained a bed weight. Notified T. Claiborne Billings with these updates, received orders to turn NaCL with KCL down to 64ml/hr. Will continue to closely monitor this patient.

## 2015-07-20 ENCOUNTER — Inpatient Hospital Stay (HOSPITAL_COMMUNITY): Payer: Managed Care, Other (non HMO) | Admitting: Anesthesiology

## 2015-07-20 ENCOUNTER — Encounter (HOSPITAL_COMMUNITY)
Admission: EM | Disposition: A | Discharge status: Home Health | Payer: Self-pay | Source: Home / Self Care | Attending: Internal Medicine

## 2015-07-20 ENCOUNTER — Encounter (HOSPITAL_COMMUNITY): Payer: Self-pay | Admitting: Internal Medicine

## 2015-07-20 ENCOUNTER — Inpatient Hospital Stay (HOSPITAL_COMMUNITY): Payer: Managed Care, Other (non HMO)

## 2015-07-20 DIAGNOSIS — S4290XA Fracture of unspecified shoulder girdle, part unspecified, initial encounter for closed fracture: Secondary | ICD-10-CM | POA: Diagnosis present

## 2015-07-20 HISTORY — PX: ORIF HUMERUS FRACTURE: SHX2126

## 2015-07-20 LAB — GLUCOSE, CAPILLARY
GLUCOSE-CAPILLARY: 125 mg/dL — AB (ref 65–99)
Glucose-Capillary: 138 mg/dL — ABNORMAL HIGH (ref 65–99)
Glucose-Capillary: 122 mg/dL — ABNORMAL HIGH (ref 65–99)
Glucose-Capillary: 112 mg/dL — ABNORMAL HIGH (ref 65–99)

## 2015-07-20 LAB — BASIC METABOLIC PANEL
ANION GAP: 6 (ref 5–15)
BUN: 13 mg/dL (ref 6–20)
CALCIUM: 8.8 mg/dL — AB (ref 8.9–10.3)
CO2: 30 mmol/L (ref 22–32)
CREATININE: 0.79 mg/dL (ref 0.61–1.24)
Chloride: 94 mmol/L — ABNORMAL LOW (ref 101–111)
GFR calc Af Amer: >60 mL/min (ref >60)
GFR calc non Af Amer: >60 mL/min (ref >60)
GLUCOSE: 143 mg/dL — AB (ref 65–99)
Potassium: 3.9 mmol/L (ref 3.5–5.1)
Sodium: 130 mmol/L — ABNORMAL LOW (ref 135–145)

## 2015-07-20 LAB — CBC
HCT: 36.4 % — ABNORMAL LOW (ref 39.0–52.0)
HEMOGLOBIN: 12.5 g/dL — AB (ref 13.0–17.0)
MCH: 34.5 pg — AB (ref 26.0–34.0)
MCHC: 34.3 g/dL (ref 30.0–36.0)
MCV: 100.6 fL — ABNORMAL HIGH (ref 78.0–100.0)
Platelets: 271 10*3/uL (ref 150–400)
RBC: 3.62 MIL/uL — ABNORMAL LOW (ref 4.22–5.81)
RDW: 13.2 % (ref 11.5–15.5)
WBC: 11.2 10*3/uL — ABNORMAL HIGH (ref 4.0–10.5)

## 2015-07-20 LAB — HEMOGLOBIN A1C
HEMOGLOBIN A1C: 7.7 % — AB (ref 4.8–5.6)
MEAN PLASMA GLUCOSE: 174 mg/dL

## 2015-07-20 SURGERY — OPEN REDUCTION INTERNAL FIXATION (ORIF) PROXIMAL HUMERUS FRACTURE
Anesthesia: Regional | Laterality: Right

## 2015-07-20 MED ORDER — METHOCARBAMOL 1000 MG/10ML IJ SOLN
500.0000 mg | Freq: Four times a day (QID) | INTRAVENOUS | Status: DC | PRN
  Filled 2015-07-20: qty 5

## 2015-07-20 MED ORDER — LACTATED RINGERS IV SOLN
INTRAVENOUS | Status: DC | PRN
  Administered 2015-07-20: 16:00:00 via INTRAVENOUS

## 2015-07-20 MED ORDER — BISACODYL 10 MG RE SUPP
10.0000 mg | Freq: Every day | RECTAL | Status: DC | PRN
Start: 2015-07-20 — End: 2015-07-23

## 2015-07-20 MED ORDER — BUPIVACAINE-EPINEPHRINE (PF) 0.25% -1:200000 IJ SOLN
INTRAMUSCULAR | Status: DC | PRN
  Administered 2015-07-20: 5 mL

## 2015-07-20 MED ORDER — SPIRONOLACTONE 25 MG PO TABS
25.0000 mg | ORAL_TABLET | Freq: Every day | ORAL | Status: DC
  Administered 2015-07-20 – 2015-07-23 (×4): 25 mg via ORAL
  Filled 2015-07-20 (×4): qty 1

## 2015-07-20 MED ORDER — POLYETHYLENE GLYCOL 3350 17 G PO PACK: 17.0000 g | PACK | Freq: Every day | ORAL | Status: DC | PRN

## 2015-07-20 MED ORDER — HYDROMORPHONE HCL 1 MG/ML IJ SOLN: 0.2500 mg | INTRAMUSCULAR | Status: DC | PRN

## 2015-07-20 MED ORDER — SODIUM CHLORIDE 0.9 % IV SOLN
INTRAVENOUS | Status: DC
  Administered 2015-07-20: 20:00:00 via INTRAVENOUS

## 2015-07-20 MED ORDER — BUPIVACAINE-EPINEPHRINE (PF) 0.5% -1:200000 IJ SOLN
INTRAMUSCULAR | Status: DC | PRN
  Administered 2015-07-20: 25 mL via PERINEURAL

## 2015-07-20 MED ORDER — FENTANYL CITRATE (PF) 250 MCG/5ML IJ SOLN
INTRAMUSCULAR | Status: AC
  Filled 2015-07-20: qty 5

## 2015-07-20 MED ORDER — EPHEDRINE SULFATE 50 MG/ML IJ SOLN
INTRAMUSCULAR | Status: DC | PRN
  Administered 2015-07-20 (×2): 10 mg via INTRAVENOUS

## 2015-07-20 MED ORDER — LACTATED RINGERS IV SOLN
INTRAVENOUS | Status: DC | PRN
  Administered 2015-07-20: 17:00:00 via INTRAVENOUS

## 2015-07-20 MED ORDER — OXYCODONE HCL 5 MG PO TABS: 5.0000 mg | ORAL_TABLET | Freq: Once | ORAL | Status: DC | PRN

## 2015-07-20 MED ORDER — LIDOCAINE HCL (CARDIAC) 20 MG/ML IV SOLN
INTRAVENOUS | Status: DC | PRN
  Administered 2015-07-20: 50 mg via INTRAVENOUS

## 2015-07-20 MED ORDER — CLINDAMYCIN PHOSPHATE 600 MG/50ML IV SOLN
600.0000 mg | Freq: Four times a day (QID) | INTRAVENOUS | Status: AC
  Administered 2015-07-20 – 2015-07-21 (×3): 600 mg via INTRAVENOUS
  Filled 2015-07-20 (×3): qty 50

## 2015-07-20 MED ORDER — ROCURONIUM BROMIDE 100 MG/10ML IV SOLN
INTRAVENOUS | Status: DC | PRN
Start: 2015-07-20 — End: 2015-07-20
  Administered 2015-07-20: 50 mg via INTRAVENOUS
  Administered 2015-07-20 (×2): 10 mg via INTRAVENOUS

## 2015-07-20 MED ORDER — OXYCODONE HCL 5 MG/5ML PO SOLN: 5.0000 mg | Freq: Once | ORAL | Status: DC | PRN

## 2015-07-20 MED ORDER — MIDAZOLAM HCL 2 MG/2ML IJ SOLN
INTRAMUSCULAR | Status: AC
  Administered 2015-07-20: 1 mg
  Filled 2015-07-20: qty 2

## 2015-07-20 MED ORDER — ONDANSETRON HCL 4 MG PO TABS: 4.0000 mg | ORAL_TABLET | Freq: Four times a day (QID) | ORAL | Status: DC | PRN

## 2015-07-20 MED ORDER — ACETAMINOPHEN 325 MG PO TABS: 650.0000 mg | ORAL_TABLET | Freq: Four times a day (QID) | ORAL | Status: DC | PRN

## 2015-07-20 MED ORDER — PHENOL 1.4 % MT LIQD: 1.0000 | OROMUCOSAL | Status: DC | PRN

## 2015-07-20 MED ORDER — FENTANYL CITRATE (PF) 100 MCG/2ML IJ SOLN
INTRAMUSCULAR | Status: AC
  Filled 2015-07-20: qty 2

## 2015-07-20 MED ORDER — GLYCOPYRROLATE 0.2 MG/ML IJ SOLN
INTRAMUSCULAR | Status: DC | PRN
  Administered 2015-07-20: 0.4 mg via INTRAVENOUS

## 2015-07-20 MED ORDER — MIDAZOLAM HCL 2 MG/2ML IJ SOLN
1.0000 mg | Freq: Once | INTRAMUSCULAR | Status: DC
Start: 2015-07-20 — End: 2015-07-23
  Filled 2015-07-20: qty 1

## 2015-07-20 MED ORDER — ONDANSETRON HCL 4 MG/2ML IJ SOLN: 4.0000 mg | Freq: Four times a day (QID) | INTRAMUSCULAR | Status: DC | PRN

## 2015-07-20 MED ORDER — METOCLOPRAMIDE HCL 5 MG PO TABS: 5.0000 mg | ORAL_TABLET | Freq: Three times a day (TID) | ORAL | Status: DC | PRN

## 2015-07-20 MED ORDER — PROPOFOL 10 MG/ML IV BOLUS
INTRAVENOUS | Status: DC | PRN
Start: 2015-07-20 — End: 2015-07-20
  Administered 2015-07-20: 200 mg via INTRAVENOUS

## 2015-07-20 MED ORDER — PROMETHAZINE HCL 25 MG/ML IJ SOLN
6.2500 mg | INTRAMUSCULAR | Status: DC | PRN
Start: 2015-07-20 — End: 2015-07-20

## 2015-07-20 MED ORDER — PROPOFOL 10 MG/ML IV BOLUS
INTRAVENOUS | Status: AC
  Filled 2015-07-20: qty 20

## 2015-07-20 MED ORDER — ROCURONIUM BROMIDE 50 MG/5ML IV SOLN
INTRAVENOUS | Status: AC
  Filled 2015-07-20: qty 1

## 2015-07-20 MED ORDER — ALBUMIN HUMAN 5 % IV SOLN
INTRAVENOUS | Status: DC | PRN
  Administered 2015-07-20 (×2): via INTRAVENOUS

## 2015-07-20 MED ORDER — GLYCOPYRROLATE 0.2 MG/ML IJ SOLN
INTRAMUSCULAR | Status: AC
  Filled 2015-07-20: qty 2

## 2015-07-20 MED ORDER — MORPHINE SULFATE (PF) 2 MG/ML IV SOLN: 2.0000 mg | INTRAVENOUS | Status: DC | PRN

## 2015-07-20 MED ORDER — METHOCARBAMOL 500 MG PO TABS: 500.0000 mg | ORAL_TABLET | Freq: Four times a day (QID) | ORAL | Status: DC | PRN

## 2015-07-20 MED ORDER — 0.9 % SODIUM CHLORIDE (POUR BTL) OPTIME
TOPICAL | Status: DC | PRN
  Administered 2015-07-20: 1000 mL

## 2015-07-20 MED ORDER — HYDROCODONE-ACETAMINOPHEN 5-325 MG PO TABS: 1.0000 | ORAL_TABLET | Freq: Four times a day (QID) | ORAL | Status: DC | PRN

## 2015-07-20 MED ORDER — FENTANYL CITRATE (PF) 100 MCG/2ML IJ SOLN
INTRAMUSCULAR | Status: DC | PRN
  Administered 2015-07-20: 100 ug via INTRAVENOUS

## 2015-07-20 MED ORDER — CARVEDILOL 3.125 MG PO TABS
3.1250 mg | ORAL_TABLET | Freq: Two times a day (BID) | ORAL | Status: DC
  Administered 2015-07-20 – 2015-07-22 (×4): 3.125 mg via ORAL
  Filled 2015-07-20 (×5): qty 1

## 2015-07-20 MED ORDER — METOCLOPRAMIDE HCL 5 MG/ML IJ SOLN: 5.0000 mg | Freq: Three times a day (TID) | INTRAMUSCULAR | Status: DC | PRN

## 2015-07-20 MED ORDER — SODIUM CHLORIDE 0.9 % IV SOLN
10000.0000 ug | INTRAVENOUS | Status: DC | PRN
  Administered 2015-07-20: 30 ug/min via INTRAVENOUS

## 2015-07-20 MED ORDER — FENTANYL CITRATE (PF) 100 MCG/2ML IJ SOLN
50.0000 ug | Freq: Once | INTRAMUSCULAR | Status: AC
  Administered 2015-07-20: 50 ug via INTRAVENOUS
  Filled 2015-07-20: qty 1

## 2015-07-20 MED ORDER — NEOSTIGMINE METHYLSULFATE 10 MG/10ML IV SOLN
INTRAVENOUS | Status: AC
  Filled 2015-07-20: qty 1

## 2015-07-20 MED ORDER — BUPIVACAINE-EPINEPHRINE (PF) 0.25% -1:200000 IJ SOLN
INTRAMUSCULAR | Status: AC
  Filled 2015-07-20: qty 30

## 2015-07-20 MED ORDER — NEOSTIGMINE METHYLSULFATE 10 MG/10ML IV SOLN
INTRAVENOUS | Status: DC | PRN
  Administered 2015-07-20: 3 mg via INTRAVENOUS

## 2015-07-20 MED ORDER — ACETAMINOPHEN 650 MG RE SUPP: 650.0000 mg | Freq: Four times a day (QID) | RECTAL | Status: DC | PRN

## 2015-07-20 MED ORDER — LACTATED RINGERS IV SOLN
INTRAVENOUS | Status: DC
  Administered 2015-07-20: 15:00:00 via INTRAVENOUS

## 2015-07-20 MED ORDER — ONDANSETRON HCL 4 MG/2ML IJ SOLN
INTRAMUSCULAR | Status: DC | PRN
  Administered 2015-07-20: 4 mg via INTRAVENOUS

## 2015-07-20 MED ORDER — MENTHOL 3 MG MT LOZG: 1.0000 | LOZENGE | OROMUCOSAL | Status: DC | PRN

## 2015-07-20 SURGICAL SUPPLY — 57 items
BIT DRILL 4 LONG FAST STEP (BIT) ×2 IMPLANT
BIT DRILL 4 SHORT FAST STEP (BIT) ×2 IMPLANT
BIT DRILL SNP 4.0MM (BIT) ×1 IMPLANT
COVER SURGICAL LIGHT HANDLE (MISCELLANEOUS) ×2 IMPLANT
DRAPE IMP U-DRAPE 54X76 (DRAPES) ×2 IMPLANT
DRAPE INCISE IOBAN 66X45 STRL (DRAPES) ×4 IMPLANT
DRAPE U-SHAPE 47X51 STRL (DRAPES) ×2 IMPLANT
DRILL BIT SNP 4.0MM (BIT) ×1
DRSG EMULSION OIL 3X3 NADH (GAUZE/BANDAGES/DRESSINGS) ×2 IMPLANT
DRSG PAD ABDOMINAL 8X10 ST (GAUZE/BANDAGES/DRESSINGS) ×2 IMPLANT
DURAPREP 26ML APPLICATOR (WOUND CARE) ×2 IMPLANT
ELECT REM PT RETURN 9FT ADLT (ELECTROSURGICAL) ×2
ELECTRODE REM PT RTRN 9FT ADLT (ELECTROSURGICAL) ×1 IMPLANT
GAUZE SPONGE 4X4 12PLY STRL (GAUZE/BANDAGES/DRESSINGS) ×2 IMPLANT
GLOVE BIOGEL PI ORTHO PRO 7.5 (GLOVE) ×1
GLOVE BIOGEL PI ORTHO PRO SZ8 (GLOVE) ×1
GLOVE ORTHO TXT STRL SZ7.5 (GLOVE) ×2 IMPLANT
GLOVE PI ORTHO PRO STRL 7.5 (GLOVE) ×1 IMPLANT
GLOVE PI ORTHO PRO STRL SZ8 (GLOVE) ×1 IMPLANT
GLOVE SURG ORTHO 8.5 STRL (GLOVE) ×2 IMPLANT
GOWN STRL REUS W/ TWL XL LVL3 (GOWN DISPOSABLE) ×2 IMPLANT
GOWN STRL REUS W/TWL XL LVL3 (GOWN DISPOSABLE) ×2
K-WIRE 2X5 SS THRDED S3 (WIRE) ×2
KIT BASIN OR (CUSTOM PROCEDURE TRAY) ×2 IMPLANT
KIT ROOM TURNOVER OR (KITS) ×2 IMPLANT
KWIRE 2X5 SS THRDED S3 (WIRE) ×1 IMPLANT
MANIFOLD NEPTUNE II (INSTRUMENTS) ×2 IMPLANT
NEEDLE HYPO 25GX1X1/2 BEV (NEEDLE) IMPLANT
NS IRRIG 1000ML POUR BTL (IV SOLUTION) ×2 IMPLANT
PACK SHOULDER (CUSTOM PROCEDURE TRAY) ×2 IMPLANT
PACK UNIVERSAL I (CUSTOM PROCEDURE TRAY) ×2 IMPLANT
PAD ABD 8X10 STRL (GAUZE/BANDAGES/DRESSINGS) ×2 IMPLANT
PAD ARMBOARD 7.5X6 YLW CONV (MISCELLANEOUS) ×4 IMPLANT
PEG STND 4.0X40MM (Orthopedic Implant) ×2 IMPLANT
PEG STND 4.0X42.5MM (Orthopedic Implant) ×2 IMPLANT
PEG STND 4.0X45.0MM (Orthopedic Implant) ×2 IMPLANT
PEG STND 4.0X55.0MM (Orthopedic Implant) ×2 IMPLANT
PEGSTD 4.0X40MM (Orthopedic Implant) ×1 IMPLANT
PEGSTD 4.0X42.5MM (Orthopedic Implant) ×1 IMPLANT
PEGSTD 4.0X45.0MM (Orthopedic Implant) ×1 IMPLANT
PLATE SZ3 SHOULDER NAIL SNP (Plate) ×2 IMPLANT
SCREW SNP UNICORTICAL (Screw) ×6 IMPLANT
SLING ARM IMMOBILIZER XL (CAST SUPPLIES) ×2 IMPLANT
SPONGE LAP 4X18 X RAY DECT (DISPOSABLE) ×4 IMPLANT
STRIP CLOSURE SKIN 1/2X4 (GAUZE/BANDAGES/DRESSINGS) ×2 IMPLANT
SUCTION FRAZIER TIP 10 FR DISP (SUCTIONS) ×2 IMPLANT
SUT FIBERWIRE #2 38 T-5 BLUE (SUTURE)
SUT MNCRL AB 4-0 PS2 18 (SUTURE) ×2 IMPLANT
SUT VIC AB 0 CT1 27 (SUTURE) ×1
SUT VIC AB 0 CT1 27XBRD ANBCTR (SUTURE) ×1 IMPLANT
SUT VIC AB 2-0 CT1 27 (SUTURE) ×2
SUT VIC AB 2-0 CT1 TAPERPNT 27 (SUTURE) ×2 IMPLANT
SUTURE FIBERWR #2 38 T-5 BLUE (SUTURE) IMPLANT
SYR CONTROL 10ML LL (SYRINGE) ×2 IMPLANT
TOWEL OR 17X24 6PK STRL BLUE (TOWEL DISPOSABLE) ×2 IMPLANT
TOWEL OR 17X26 10 PK STRL BLUE (TOWEL DISPOSABLE) ×2 IMPLANT
WATER STERILE IRR 1000ML POUR (IV SOLUTION) ×2 IMPLANT

## 2015-07-20 NOTE — H&P (View-Only) (Signed)
Justin Bernard is an 53 y.o. male.    Chief Complaint: right shoulder pain  HPI: 53 y/o male with recent history of a "pop" in his right shoulder several days ago. Pt admitted with light headedness, hyponatremia, and hypokalemia. Pt denies any falls but states he pulled with the right arm and felt a pop. Possible fall but pt denies. C/o moderate pain to right shoulder and inability to use the right arm very well. Denies previous problems with the right shoulder in the past. Currently being medically optimized for surgery.  PCP:  No PCP Per Patient  PMH: Past Medical History  Diagnosis Date  . CHF (congestive heart failure) (HCC)   . Asthma     PSH: Past Surgical History  Procedure Laterality Date  . Back surgery      Social History:  reports that he has never smoked. He has never used smokeless tobacco. He reports that he does not drink alcohol or use illicit drugs.  Allergies:  Allergies  Allergen Reactions  . Penicillins Other (See Comments)    Possible childhood reaction    Medications: Current Facility-Administered Medications  Medication Dose Route Frequency Provider Last Rate Last Dose  . acetaminophen (TYLENOL) tablet 650 mg  650 mg Oral Q6H PRN Anastassia Doutova, MD       Or  . acetaminophen (TYLENOL) suppository 650 mg  650 mg Rectal Q6H PRN Anastassia Doutova, MD      . enoxaparin (LOVENOX) injection 60 mg  60 mg Subcutaneous Q24H Caron G Amend, RPH      . HYDROcodone-acetaminophen (NORCO/VICODIN) 5-325 MG per tablet 1-2 tablet  1-2 tablet Oral Q4H PRN Anastassia Doutova, MD      . insulin aspart (novoLOG) injection 0-9 Units  0-9 Units Subcutaneous TID WC Mackenzie Short, MD      . magnesium chloride (SLOW-MAG) 64 MG SR tablet 64 mg  1 tablet Oral BID Mackenzie Short, MD   64 mg at 07/19/15 1135  . ondansetron (ZOFRAN) tablet 4 mg  4 mg Oral Q6H PRN Anastassia Doutova, MD       Or  . ondansetron (ZOFRAN) injection 4 mg  4 mg Intravenous Q6H PRN Anastassia  Doutova, MD      . pneumococcal 23 valent vaccine (PNU-IMMUNE) injection 0.5 mL  0.5 mL Intramuscular Tomorrow-1000 Mackenzie Short, MD      . sodium chloride 0.9 % injection 3 mL  3 mL Intravenous Q12H Anastassia Doutova, MD   3 mL at 07/18/15 2253    Results for orders placed or performed during the hospital encounter of 07/17/15 (from the past 48 hour(s))  CBC with Differential     Status: Abnormal   Collection Time: 07/17/15  7:56 PM  Result Value Ref Range   WBC 15.0 (H) 4.0 - 10.5 K/uL   RBC 4.01 (L) 4.22 - 5.81 MIL/uL   Hemoglobin 13.6 13.0 - 17.0 g/dL   HCT 36.8 (L) 39.0 - 52.0 %   MCV 91.8 78.0 - 100.0 fL   MCH 33.9 26.0 - 34.0 pg   MCHC 37.0 (H) 30.0 - 36.0 g/dL   RDW 12.6 11.5 - 15.5 %   Platelets 271 150 - 400 K/uL   Neutrophils Relative % 85 %   Neutro Abs 12.7 (H) 1.7 - 7.7 K/uL   Lymphocytes Relative 11 %   Lymphs Abs 1.6 0.7 - 4.0 K/uL   Monocytes Relative 5 %   Monocytes Absolute 0.7 0.1 - 1.0 K/uL   Eosinophils Relative 0 %     Eosinophils Absolute 0.0 0.0 - 0.7 K/uL   Basophils Relative 0 %   Basophils Absolute 0.0 0.0 - 0.1 K/uL  Comprehensive metabolic panel     Status: Abnormal   Collection Time: 07/17/15  7:56 PM  Result Value Ref Range   Sodium 117 (LL) 135 - 145 mmol/L    Comment: REPEATED TO VERIFY CRITICAL RESULT CALLED TO, READ BACK BY AND VERIFIED WITH: C CHRISCOE,RN 2037 07/17/2015 WBOND    Potassium <2.0 (LL) 3.5 - 5.1 mmol/L    Comment: REPEATED TO VERIFY CRITICAL RESULT CALLED TO, READ BACK BY AND VERIFIED WITH: C CHRISCOE,RN 2037 07/17/2015 WBOND    Chloride 66 (L) 101 - 111 mmol/L   CO2 36 (H) 22 - 32 mmol/L   Glucose, Bld 157 (H) 65 - 99 mg/dL   BUN 21 (H) 6 - 20 mg/dL   Creatinine, Ser 1.31 (H) 0.61 - 1.24 mg/dL   Calcium 8.7 (L) 8.9 - 10.3 mg/dL   Total Protein 5.7 (L) 6.5 - 8.1 g/dL   Albumin 2.8 (L) 3.5 - 5.0 g/dL   AST 155 (H) 15 - 41 U/L   ALT 61 17 - 63 U/L   Alkaline Phosphatase 120 38 - 126 U/L   Total Bilirubin 2.2 (H)  0.3 - 1.2 mg/dL   GFR calc non Af Amer >60 >60 mL/min   GFR calc Af Amer >60 >60 mL/min    Comment: (NOTE) The eGFR has been calculated using the CKD EPI equation. This calculation has not been validated in all clinical situations. eGFR's persistently <60 mL/min signify possible Chronic Kidney Disease.    Anion gap 15 5 - 15  Brain natriuretic peptide     Status: None   Collection Time: 07/17/15  7:56 PM  Result Value Ref Range   B Natriuretic Peptide 63.4 0.0 - 100.0 pg/mL  Magnesium     Status: None   Collection Time: 07/17/15  7:56 PM  Result Value Ref Range   Magnesium 2.3 1.7 - 2.4 mg/dL  I-Stat Troponin, ED - 0, 3, 6 hours (not at [facility])     Status: None   Collection Time: 07/17/15  7:58 PM  Result Value Ref Range   Troponin i, poc 0.01 0.00 - 0.08 ng/mL   Comment 3            Comment: Due to the release kinetics of cTnI, a negative result within the first hours of the onset of symptoms does not rule out myocardial infarction with certainty. If myocardial infarction is still suspected, repeat the test at appropriate intervals.   Basic metabolic panel     Status: Abnormal   Collection Time: 07/17/15 10:28 PM  Result Value Ref Range   Sodium 116 (LL) 135 - 145 mmol/L    Comment: CRITICAL RESULT CALLED TO, READ BACK BY AND VERIFIED WITH: HUGHES,C RN 07/17/2015 2337 JORDANS CONSISTENT WITH PREVIOUS RESULT    Potassium <2.0 (LL) 3.5 - 5.1 mmol/L    Comment: CRITICAL RESULT CALLED TO, READ BACK BY AND VERIFIED WITH: HUGHES,C RN 07/17/2015 2337 JORDANS CONSISTENT WITH PREVIOUS RESULT    Chloride 69 (L) 101 - 111 mmol/L   CO2 32 22 - 32 mmol/L   Glucose, Bld 231 (H) 65 - 99 mg/dL   BUN 22 (H) 6 - 20 mg/dL   Creatinine, Ser 1.28 (H) 0.61 - 1.24 mg/dL   Calcium 8.2 (L) 8.9 - 10.3 mg/dL   GFR calc non Af Amer >60 >60 mL/min   GFR calc Af  Amer >60 >60 mL/min    Comment: (NOTE) The eGFR has been calculated using the CKD EPI equation. This calculation has not been validated  in all clinical situations. eGFR's persistently <60 mL/min signify possible Chronic Kidney Disease.    Anion gap 15 5 - 15  I-Stat Troponin, ED - 0, 3, 6 hours (not at [facility])     Status: None   Collection Time: 07/17/15 10:30 PM  Result Value Ref Range   Troponin i, poc 0.04 0.00 - 0.08 ng/mL   Comment 3            Comment: Due to the release kinetics of cTnI, a negative result within the first hours of the onset of symptoms does not rule out myocardial infarction with certainty. If myocardial infarction is still suspected, repeat the test at appropriate intervals.   Urinalysis, Routine w reflex microscopic (not at [facility])     Status: Abnormal   Collection Time: 07/18/15  1:13 AM  Result Value Ref Range   Color, Urine AMBER (A) YELLOW    Comment: BIOCHEMICALS MAY BE AFFECTED BY COLOR   APPearance CLOUDY (A) CLEAR   Specific Gravity, Urine 1.016 1.005 - 1.030   pH 6.5 5.0 - 8.0   Glucose, UA NEGATIVE NEGATIVE mg/dL   Hgb urine dipstick LARGE (A) NEGATIVE   Bilirubin Urine SMALL (A) NEGATIVE   Ketones, ur 15 (A) NEGATIVE mg/dL   Protein, ur 30 (A) NEGATIVE mg/dL   Nitrite NEGATIVE NEGATIVE   Leukocytes, UA SMALL (A) NEGATIVE  Urine culture     Status: None   Collection Time: 07/18/15  1:13 AM  Result Value Ref Range   Specimen Description URINE, CATHETERIZED    Special Requests NONE    Culture NO GROWTH 1 DAY    Report Status 07/19/2015 FINAL   Urine microscopic-add on     Status: Abnormal   Collection Time: 07/18/15  1:13 AM  Result Value Ref Range   Squamous Epithelial / LPF 0-5 (A) NONE SEEN   WBC, UA 6-30 0 - 5 WBC/hpf   RBC / HPF TOO NUMEROUS TO COUNT 0 - 5 RBC/hpf   Bacteria, UA RARE (A) NONE SEEN   Casts HYALINE CASTS (A) NEGATIVE  Creatinine, urine, random     Status: None   Collection Time: 07/18/15  1:14 AM  Result Value Ref Range   Creatinine, Urine 137.17 mg/dL  Sodium, urine, random     Status: None   Collection Time: 07/18/15  1:14 AM  Result Value Ref  Range   Sodium, Ur <10 mmol/L  Osmolality, urine     Status: None   Collection Time: 07/18/15  1:14 AM  Result Value Ref Range   Osmolality, Ur 346 300 - 900 mOsm/kg    Comment: Please note change in reference range.  I-Stat Troponin, ED - 0, 3, 6 hours (not at [facility])     Status: None   Collection Time: 07/18/15  1:29 AM  Result Value Ref Range   Troponin i, poc 0.02 0.00 - 0.08 ng/mL   Comment 3            Comment: Due to the release kinetics of cTnI, a negative result within the first hours of the onset of symptoms does not rule out myocardial infarction with certainty. If myocardial infarction is still suspected, repeat the test at appropriate intervals.   Urinalysis, Routine w reflex microscopic (not at [facility])     Status: Abnormal   Collection Time: 07/18/15  3:51  AM  Result Value Ref Range   Color, Urine YELLOW YELLOW   APPearance CLOUDY (A) CLEAR   Specific Gravity, Urine 1.007 1.005 - 1.030   pH 6.5 5.0 - 8.0   Glucose, UA NEGATIVE NEGATIVE mg/dL   Hgb urine dipstick LARGE (A) NEGATIVE   Bilirubin Urine NEGATIVE NEGATIVE   Ketones, ur NEGATIVE NEGATIVE mg/dL   Protein, ur NEGATIVE NEGATIVE mg/dL   Nitrite NEGATIVE NEGATIVE   Leukocytes, UA MODERATE (A) NEGATIVE  Urine microscopic-add on     Status: Abnormal   Collection Time: 07/18/15  3:51 AM  Result Value Ref Range   Squamous Epithelial / LPF 0-5 (A) NONE SEEN   WBC, UA 6-30 0 - 5 WBC/hpf   RBC / HPF 6-30 0 - 5 RBC/hpf   Bacteria, UA FEW (A) NONE SEEN  Troponin I     Status: None   Collection Time: 07/18/15  3:53 AM  Result Value Ref Range   Troponin I 0.03 <0.031 ng/mL    Comment:        NO INDICATION OF MYOCARDIAL INJURY.   Magnesium     Status: None   Collection Time: 07/18/15  3:59 AM  Result Value Ref Range   Magnesium 2.2 1.7 - 2.4 mg/dL  Phosphorus     Status: None   Collection Time: 07/18/15  3:59 AM  Result Value Ref Range   Phosphorus 2.5 2.5 - 4.6 mg/dL  Comprehensive metabolic panel      Status: Abnormal   Collection Time: 07/18/15  3:59 AM  Result Value Ref Range   Sodium 118 (LL) 135 - 145 mmol/L    Comment: CRITICAL RESULT CALLED TO, READ BACK BY AND VERIFIED WITH: HESTER,B RN 07/18/2015 0451 JORDANS    Potassium 2.1 (LL) 3.5 - 5.1 mmol/L    Comment: CRITICAL RESULT CALLED TO, READ BACK BY AND VERIFIED WITH: HESTER,B RN 07/18/2015 0451 JORDANS    Chloride 76 (L) 101 - 111 mmol/L   CO2 28 22 - 32 mmol/L   Glucose, Bld 199 (H) 65 - 99 mg/dL   BUN 22 (H) 6 - 20 mg/dL   Creatinine, Ser 1.38 (H) 0.61 - 1.24 mg/dL   Calcium 8.0 (L) 8.9 - 10.3 mg/dL   Total Protein 4.9 (L) 6.5 - 8.1 g/dL   Albumin 2.4 (L) 3.5 - 5.0 g/dL   AST 114 (H) 15 - 41 U/L   ALT 48 17 - 63 U/L   Alkaline Phosphatase 104 38 - 126 U/L   Total Bilirubin 2.0 (H) 0.3 - 1.2 mg/dL   GFR calc non Af Amer 57 (L) >60 mL/min   GFR calc Af Amer >60 >60 mL/min    Comment: (NOTE) The eGFR has been calculated using the CKD EPI equation. This calculation has not been validated in all clinical situations. eGFR's persistently <60 mL/min signify possible Chronic Kidney Disease.    Anion gap 14 5 - 15  CBC     Status: Abnormal   Collection Time: 07/18/15  3:59 AM  Result Value Ref Range   WBC 13.3 (H) 4.0 - 10.5 K/uL   RBC 3.48 (L) 4.22 - 5.81 MIL/uL   Hemoglobin 11.8 (L) 13.0 - 17.0 g/dL   HCT 32.8 (L) 39.0 - 52.0 %   MCV 94.3 78.0 - 100.0 fL   MCH 33.9 26.0 - 34.0 pg   MCHC 36.0 30.0 - 36.0 g/dL   RDW 12.7 11.5 - 15.5 %   Platelets 229 150 - 400 K/uL  TSH       Status: None   Collection Time: 07/18/15  4:01 AM  Result Value Ref Range   TSH 2.054 0.350 - 4.500 uIU/mL  Basic metabolic panel     Status: Abnormal   Collection Time: 07/18/15  7:19 AM  Result Value Ref Range   Sodium 118 (LL) 135 - 145 mmol/L    Comment: CRITICAL RESULT CALLED TO, READ BACK BY AND VERIFIED WITH: HESTERBRN 0808 [id number] MCCAULEG    Potassium 2.2 (LL) 3.5 - 5.1 mmol/L    Comment: CRITICAL RESULT CALLED TO, READ BACK BY  AND VERIFIED WITH: HESTERBRN 0808 [id number] MCCAULEG    Chloride 77 (L) 101 - 111 mmol/L   CO2 30 22 - 32 mmol/L   Glucose, Bld 167 (H) 65 - 99 mg/dL   BUN 19 6 - 20 mg/dL   Creatinine, Ser 1.11 0.61 - 1.24 mg/dL   Calcium 7.9 (L) 8.9 - 10.3 mg/dL   GFR calc non Af Amer >60 >60 mL/min   GFR calc Af Amer >60 >60 mL/min    Comment: (NOTE) The eGFR has been calculated using the CKD EPI equation. This calculation has not been validated in all clinical situations. eGFR's persistently <60 mL/min signify possible Chronic Kidney Disease.    Anion gap 11 5 - 15  Troponin I     Status: Abnormal   Collection Time: 07/18/15  9:40 AM  Result Value Ref Range   Troponin I 0.04 (H) <0.031 ng/mL    Comment:        PERSISTENTLY INCREASED TROPONIN VALUES IN THE RANGE OF 0.04-0.49 ng/mL CAN BE SEEN IN:       -UNSTABLE ANGINA       -CONGESTIVE HEART FAILURE       -MYOCARDITIS       -CHEST TRAUMA       -ARRYHTHMIAS       -LATE PRESENTING MYOCARDIAL INFARCTION       -COPD   CLINICAL FOLLOW-UP RECOMMENDED.   Cortisol-am, blood     Status: Abnormal   Collection Time: 07/18/15  9:40 AM  Result Value Ref Range   Cortisol - AM 22.8 (H) 6.7 - 22.6 ug/dL  Basic metabolic panel     Status: Abnormal   Collection Time: 07/18/15 11:20 AM  Result Value Ref Range   Sodium 118 (LL) 135 - 145 mmol/L    Comment: CRITICAL RESULT CALLED TO, READ BACK BY AND VERIFIED WITH: RN HESTER,B AT 1202 [id number] MARTINB    Potassium 2.4 (LL) 3.5 - 5.1 mmol/L    Comment: CRITICAL RESULT CALLED TO, READ BACK BY AND VERIFIED WITH: RN HESTER,B AT 1202 [id number] MARTINB    Chloride 78 (L) 101 - 111 mmol/L   CO2 29 22 - 32 mmol/L   Glucose, Bld 171 (H) 65 - 99 mg/dL   BUN 21 (H) 6 - 20 mg/dL   Creatinine, Ser 1.09 0.61 - 1.24 mg/dL   Calcium 8.1 (L) 8.9 - 10.3 mg/dL   GFR calc non Af Amer >60 >60 mL/min   GFR calc Af Amer >60 >60 mL/min    Comment: (NOTE) The eGFR has been calculated using the CKD EPI equation. This  calculation has not been validated in all clinical situations. eGFR's persistently <60 mL/min signify possible Chronic Kidney Disease.    Anion gap 11 5 - 15  MRSA PCR Screening     Status: None   Collection Time: 07/18/15  2:27 PM  Result Value Ref Range   MRSA by PCR NEGATIVE NEGATIVE      Comment:        The GeneXpert MRSA Assay (FDA approved for NASAL specimens only), is one component of a comprehensive MRSA colonization surveillance program. It is not intended to diagnose MRSA infection nor to guide or monitor treatment for MRSA infections.   Basic metabolic panel     Status: Abnormal   Collection Time: 07/18/15  4:06 PM  Result Value Ref Range   Sodium 121 (L) 135 - 145 mmol/L   Potassium 2.4 (LL) 3.5 - 5.1 mmol/L    Comment: CRITICAL RESULT CALLED TO, READ BACK BY AND VERIFIED WITH: WILLIAMS,G RN 07/18/15 1655 WOOTEN,K    Chloride 82 (L) 101 - 111 mmol/L   CO2 30 22 - 32 mmol/L   Glucose, Bld 229 (H) 65 - 99 mg/dL   BUN 20 6 - 20 mg/dL   Creatinine, Ser 1.15 0.61 - 1.24 mg/dL   Calcium 8.0 (L) 8.9 - 10.3 mg/dL   GFR calc non Af Amer >60 >60 mL/min   GFR calc Af Amer >60 >60 mL/min    Comment: (NOTE) The eGFR has been calculated using the CKD EPI equation. This calculation has not been validated in all clinical situations. eGFR's persistently <60 mL/min signify possible Chronic Kidney Disease.    Anion gap 9 5 - 15  Troponin I     Status: None   Collection Time: 07/18/15  4:06 PM  Result Value Ref Range   Troponin I 0.03 <0.031 ng/mL    Comment:        NO INDICATION OF MYOCARDIAL INJURY.   Basic metabolic panel     Status: Abnormal   Collection Time: 07/18/15  7:11 PM  Result Value Ref Range   Sodium 122 (L) 135 - 145 mmol/L   Potassium 2.3 (LL) 3.5 - 5.1 mmol/L    Comment: CRITICAL RESULT CALLED TO, READ BACK BY AND VERIFIED WITH: RN THOMPSON,J AT 1950 [id number] MARTINB    Chloride 82 (L) 101 - 111 mmol/L   CO2 29 22 - 32 mmol/L   Glucose, Bld 234 (H)  65 - 99 mg/dL   BUN 19 6 - 20 mg/dL   Creatinine, Ser 1.22 0.61 - 1.24 mg/dL   Calcium 8.2 (L) 8.9 - 10.3 mg/dL   GFR calc non Af Amer >60 >60 mL/min   GFR calc Af Amer >60 >60 mL/min    Comment: (NOTE) The eGFR has been calculated using the CKD EPI equation. This calculation has not been validated in all clinical situations. eGFR's persistently <60 mL/min signify possible Chronic Kidney Disease.    Anion gap 11 5 - 15  Basic metabolic panel     Status: Abnormal   Collection Time: 07/18/15 11:33 PM  Result Value Ref Range   Sodium 123 (L) 135 - 145 mmol/L   Potassium 2.8 (L) 3.5 - 5.1 mmol/L    Comment: DELTA CHECK NOTED   Chloride 86 (L) 101 - 111 mmol/L   CO2 28 22 - 32 mmol/L   Glucose, Bld 219 (H) 65 - 99 mg/dL   BUN 19 6 - 20 mg/dL   Creatinine, Ser 1.05 0.61 - 1.24 mg/dL   Calcium 8.2 (L) 8.9 - 10.3 mg/dL   GFR calc non Af Amer >60 >60 mL/min   GFR calc Af Amer >60 >60 mL/min    Comment: (NOTE) The eGFR has been calculated using the CKD EPI equation. This calculation has not been validated in all clinical situations. eGFR's persistently <60 mL/min signify possible Chronic Kidney Disease.      Anion gap 9 5 - 15  Basic metabolic panel     Status: Abnormal   Collection Time: 07/19/15  4:08 AM  Result Value Ref Range   Sodium 126 (L) 135 - 145 mmol/L   Potassium 3.4 (L) 3.5 - 5.1 mmol/L    Comment: DELTA CHECK NOTED   Chloride 91 (L) 101 - 111 mmol/L   CO2 25 22 - 32 mmol/L   Glucose, Bld 147 (H) 65 - 99 mg/dL   BUN 18 6 - 20 mg/dL   Creatinine, Ser 0.96 0.61 - 1.24 mg/dL   Calcium 8.2 (L) 8.9 - 10.3 mg/dL   GFR calc non Af Amer >60 >60 mL/min   GFR calc Af Amer >60 >60 mL/min    Comment: (NOTE) The eGFR has been calculated using the CKD EPI equation. This calculation has not been validated in all clinical situations. eGFR's persistently <60 mL/min signify possible Chronic Kidney Disease.    Anion gap 10 5 - 15  Glucose, capillary     Status: Abnormal    Collection Time: 07/19/15  8:03 AM  Result Value Ref Range   Glucose-Capillary 164 (H) 65 - 99 mg/dL   Comment 1 Notify RN    Dg Chest 1 View  07/17/2015  CLINICAL DATA:  53-year-old male with hypotension. Medical history of CHF and asthma. EXAM: CHEST 1 VIEW COMPARISON:  Chest radiograph dated 12/04/2005 FINDINGS: Single frontal view of the chest does not demonstrate any focal consolidation, pleural effusion, or pneumothorax. Left lung base clear linear plaque likely atelectatic changes noted. The cardiac silhouette is within normal limits. Left pectoral AICD device. The osseous structures are grossly unremarkable. IMPRESSION: No active disease. Electronically Signed   By: Arash  Radparvar M.D.   On: 07/17/2015 20:54   Dg Shoulder Right  07/17/2015  CLINICAL DATA:  Right shoulder injury EXAM: RIGHT SHOULDER - 2+ VIEW COMPARISON:  None. FINDINGS: There is a comminuted right proximal humerus fracture involving the surgical neck, with mild apex lateral angulation, slight overriding of the fracture fragments and 1 shaft's with anterior displacement of the dominant distal fracture fragment. No dislocation at the right glenohumeral joint. 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 IMPRESSION: Comminuted, angulated and displaced proximal right humerus fracture involving the surgical neck. Electronically Signed   By: Jason A Poff M.D.   On: 07/17/2015 17:44   Dg Chest Port 1 View  07/19/2015  CLINICAL DATA:  Productive cough EXAM: PORTABLE CHEST 1 VIEW COMPARISON:  Chest x-rays dated 07/17/2015 and 12/04/2005. FINDINGS: Heart size is upper normal, stable. Overall cardiomediastinal silhouette remains normal in size and configuration. Left chest wall pacemaker/AICD is stable in position. Lungs are clear. No pleural effusion seen. No pneumothorax seen. Osseous structures about the chest are unremarkable. IMPRESSION: Stable chest x-ray. No evidence of acute cardiopulmonary abnormality. Lungs are  clear. Electronically Signed   By: Stan  Maynard M.D.   On: 07/19/2015 08:14    ROS: ROS Inability to raise or use right upper extremity due to pain Otherwise ros negative  Physical Exam: Alert and appropriate 53 y/o male in no acute distress Cervical spine with full rom and no tenderness Left upper extremity with full rom, no deformity Right upper extremity: mild edema No attempt at rom of right upper extremity due to known fracture nv intact distally   Physical Exam   Assessment/Plan Assessment: right proximal humerus fracture with complete displacement  Plan: Discussed with pt and family that due to the amount of displacement of the fracture surgery   is needed to correct and improve function of the right upper extremity Pt and family in agreement Will plan for surgery tomorrow afternoon NPO after midnight Sling as needed for comfort Pain management

## 2015-07-20 NOTE — Plan of Care (Signed)
Problem: Pain Managment: Goal: General experience of comfort will improve Outcome: Not Progressing Surgery planned this afternoon

## 2015-07-20 NOTE — Progress Notes (Signed)
   07/20/15 1000  Clinical Encounter Type  Visited With Patient not available;Family;Health care provider  Visit Type Initial (ADV Directive)  Referral From Physician;Patient  Consult/Referral To Chaplain  Spiritual Encounters  Spiritual Needs Literature  Advance Directives (For Healthcare)  Does patient have an advance directive? No  Would patient like information on creating an advanced directive? Yes - Educational materials given  CH referred to pt to deliver and discuss Adv. Directive; pt asleep but materials given to family and discussed with RN to call if materials completed to arrange for completion. 10:25 AM Justin Bernard

## 2015-07-20 NOTE — Brief Op Note (Signed)
07/17/2015 - 07/20/2015  5:40 PM  PATIENT:  Justin Bernard  54 y.o. male  PRE-OPERATIVE DIAGNOSIS:  RIGHT PROXIMAL HUMERUS FRACTURE, DISPLACED AND COMMINUTED  POST-OPERATIVE DIAGNOSIS:  RIGHT PROXIMAL HUMERUS FRACTURE, DISPLACED AND COMMINUTED  PROCEDURE:  Procedure(s): OPEN REDUCTION INTERNAL FIXATION (ORIF) PROXIMAL HUMERUS FRACTURE (Right) BIOMET SNP NAIL PLATE  SURGEON:  Surgeon(s) and Role:    * Beverely Low, MD - Primary  PHYSICIAN ASSISTANT:   ASSISTANTS: Thea Gist, PA-C   ANESTHESIA:   regional and general  EBL:  Total I/O In: 560 [P.O.:60; IV Piggyback:500] Out: 100 [Blood:100]  BLOOD ADMINISTERED:none  DRAINS: none   LOCAL MEDICATIONS USED:  MARCAINE     SPECIMEN:  No Specimen  DISPOSITION OF SPECIMEN:  N/A  COUNTS:  YES  TOURNIQUET:  * No tourniquets in log *  DICTATION: .Other Dictation: Dictation Number [id number]  PLAN OF CARE: Admit to inpatient   PATIENT DISPOSITION:  PACU - hemodynamically stable.   Delay start of Pharmacological VTE agent (>24hrs) due to surgical blood loss or risk of bleeding: no

## 2015-07-20 NOTE — Transfer of Care (Signed)
Immediate Anesthesia Transfer of Care Note  Patient: Justin Bernard  Procedure(s) Performed: Procedure(s): OPEN REDUCTION INTERNAL FIXATION (ORIF) PROXIMAL HUMERUS FRACTURE (Right)  Patient Location: PACU  Anesthesia Type:General  Level of Consciousness: awake, alert , oriented and patient cooperative  Airway & Oxygen Therapy: Patient Spontanous Breathing and Patient connected to face mask oxygen  Post-op Assessment: Report given to RN, Post -op Vital signs reviewed and stable and Patient moving all extremities  Post vital signs: Reviewed and stable  Last Vitals:  Filed Vitals:   07/20/15 1540 07/20/15 1754  BP: 118/65   Pulse: 96   Temp:  36.9 C  Resp: 20     Complications: No apparent anesthesia complications

## 2015-07-20 NOTE — Progress Notes (Signed)
Pt has not had Beta blocker Dr Sampson Goon informed and states to give the med Coreg given as ordered (See MAR)

## 2015-07-20 NOTE — Anesthesia Procedure Notes (Addendum)
Anesthesia Regional Block:  Interscalene brachial plexus block  Pre-Anesthetic Checklist: ,, timeout performed, Correct Patient, Correct Site, Correct Laterality, Correct Procedure, Correct Position, site marked, Risks and benefits discussed,  Surgical consent,  Pre-op evaluation,  At surgeon's request and post-op pain management  Laterality: Right  Prep: chloraprep       Needles:  Injection technique: Single-shot  Needle Type: Echogenic Stimulator Needle     Needle Length: 9cm 9 cm Needle Gauge: 21 and 21 G    Additional Needles:  Procedures: ultrasound guided (picture in chart) and nerve stimulator Interscalene brachial plexus block  Nerve Stimulator or Paresthesia:  Response: Deltoid, 0.5 mA,   Additional Responses:   Narrative:  Start time: 07/20/2015 2:45 PM End time: 07/20/2015 2:55 PM Injection made incrementally with aspirations every 5 mL.  Performed by: Personally  Anesthesiologist: Marcene Duos  Additional Notes: Risks and benefits discussed. Pt tolerated well with no immediate complications.   Procedure Name: Intubation Date/Time: 07/20/2015 4:12 PM Performed by: Wray Kearns A Pre-anesthesia Checklist: Patient identified Patient Re-evaluated:Patient Re-evaluated prior to inductionOxygen Delivery Method: Circle system utilized Preoxygenation: Pre-oxygenation with 100% oxygen Intubation Type: IV induction Ventilation: Mask ventilation without difficulty Laryngoscope Size: Miller and 3 Grade View: Grade II Tube type: Oral Tube size: 7.5 mm Number of attempts: 1 Airway Equipment and Method: Stylet Placement Confirmation: positive ETCO2 and CO2 detector Tube secured with: Tape Dental Injury: Teeth and Oropharynx as per pre-operative assessment

## 2015-07-20 NOTE — Interval H&P Note (Signed)
History and Physical Interval Note:  07/20/2015 3:18 PM  Justin Bernard  has presented today for surgery, with the diagnosis of RIGHT PROXIMAL HUMERUS FRACTURE  The various methods of treatment have been discussed with the patient and family. After consideration of risks, benefits and other options for treatment, the patient has consented to  Procedure(s): OPEN REDUCTION INTERNAL FIXATION (ORIF) PROXIMAL HUMERUS FRACTURE (Right) as a surgical intervention .  The patient's history has been reviewed, patient examined, no change in status, stable for surgery.  I have reviewed the patient's chart and labs.  Questions were answered to the patient's satisfaction.     Justin Bernard,STEVEN R

## 2015-07-20 NOTE — Anesthesia Preprocedure Evaluation (Addendum)
Anesthesia Evaluation  Patient identified by MRN, date of birth, ID band Patient awake    Reviewed: Allergy & Precautions, NPO status , Patient's Chart, lab work & pertinent test results  Airway Mallampati: III  TM Distance: >3 FB Neck ROM: Full    Dental  (+) Dental Advisory Given   Pulmonary asthma ,    breath sounds clear to auscultation       Cardiovascular +CHF   Rhythm:Regular Rate:Normal  ICD in place for previous hx of NICM. EF on admission 50-55%   Neuro/Psych negative neurological ROS     GI/Hepatic negative GI ROS, Neg liver ROS,   Endo/Other  negative endocrine ROS  Renal/GU negative Renal ROS     Musculoskeletal   Abdominal   Peds  Hematology  (+) anemia ,   Anesthesia Other Findings   Reproductive/Obstetrics                            Lab Results  Component Value Date   WBC 11.2* 07/20/2015   HGB 12.5* 07/20/2015   HCT 36.4* 07/20/2015   MCV 100.6* 07/20/2015   PLT 271 07/20/2015   Lab Results  Component Value Date   CREATININE 0.79 07/20/2015   BUN 13 07/20/2015   NA 130* 07/20/2015   K 3.9 07/20/2015   CL 94* 07/20/2015   CO2 30 07/20/2015    Anesthesia Physical Anesthesia Plan  ASA: III  Anesthesia Plan: General and Regional   Post-op Pain Management: GA combined w/ Regional for post-op pain   Induction: Intravenous  Airway Management Planned: Oral ETT  Additional Equipment:   Intra-op Plan:   Post-operative Plan: Extubation in OR  Informed Consent: I have reviewed the patients History and Physical, chart, labs and discussed the procedure including the risks, benefits and alternatives for the proposed anesthesia with the patient or authorized representative who has indicated his/her understanding and acceptance.   Dental advisory given  Plan Discussed with: CRNA  Anesthesia Plan Comments:         Anesthesia Quick Evaluation

## 2015-07-20 NOTE — Progress Notes (Signed)
[facility] PROGRESS NOTE  Justin Bernard WUJ:[id number] DOB: [birth year] DOA: 07/17/2015 PCP: No PCP Per Patient  Brief Summary  54 yo M with history of systolic heart failure followed at [facility] and asthma who presented with lightheadedness. Also injured his right arm, "heard a pop" when trying to pull himself up in bed the day prior to admission.  He was found to be hyponatremic, hypokalemic, and hypotensive.    Assessment/Plan   Dehydration due to diuretics, resolved.  Developing some bilateral lower extremity edema, but no rales -  Resume eplerenone > substitute spironolactone  Hypokalemia, resolved with oral and IV potassium repletion from less than detectable to normal limits.  Required of potassium.   -  Continue magnesium supplementation  Hyponatremia, resolving gradually at < per 24 hours -  Okay to resume spironolactone  Hyperglycemia -  SSI -  A1c pending  Chronic systolic heart failure, previous EF 10% and was VAD candidate and on transplant list for a while.  Currently, EF 50-55% on current ECHO with mild LVH -  Resume spironolactone -  Resume low dose beta blocker:  Carvedilol 3.125mg  -  Case discussed with cardiologists from [facility], recommend half of previous lasix dose and continuing epleronone once euvolemic -  Resume salt restriction when diet advanced  Right humerus fracture, continue sling  -  To OR today, medically stable for surgery  Fall, likely due to orthostatic hypotensive -  PT/OT evaluations after surgery  Diet:  regular Access:  PIV IVF:  off Proph:  lovenox  Code Status: partial Family Communication:  Patient, his mother and sister Disposition Plan:  Post surgery likely to home   Consultants:  Orthopedic surgery  Procedures:  none  Antibiotics:  none   HPI/Subjective:  Has had some swelling in his bilateral ankles.  Denies cough and shortness of breath.  Mother concerned that he has fluid building up in his  chest.    Objective: Filed Vitals:   07/19/15 1200 07/19/15 2022 07/20/15 0000 07/20/15 0500  BP: 94/61 103/55 102/60 97/59  Pulse: 96 101 98 85  Temp: 98.1 F (36.7 C) 98.2 F (36.8 C) 98.1 F (36.7 C) 97.9 F (36.6 C)  TempSrc: Oral Oral Oral Oral  Resp: 16 16    Height:      Weight:    127.143 kg (280 lb 4.8 oz)  SpO2: 96% 98% 93% 97%    Intake/Output Summary (Last 24 hours) at 07/20/15 1227 Last data filed at 07/20/15 0800  Gross per 24 hour  Intake    960 ml  Output   1425 ml  Net   -465 ml   Filed Weights   07/19/15 0418 07/19/15 0527 07/20/15 0500  Weight: 126.644 kg (279 lb 3.2 oz) 125.556 kg (276 lb 12.8 oz) 127.143 kg (280 lb 4.8 oz)   Body mass index is 34.13 kg/(m^2).  Exam:   General:  Adult male, No acute distress  HEENT:  NCAT, MMM  Cardiovascular:  RRR, nl S1, S2 no mrg, 2+ pulses, warm extremities  Respiratory:  CTAB, no increased WOB  Abdomen:   NABS, soft, NT/ND  MSK:   Normal tone and bulk, 1+ pitting bilateral LEE  Neuro:  Grossly intact  Data Reviewed: Basic Metabolic Panel:  Recent Labs Lab 07/17/15 1956  07/18/15 0359  07/18/15 1911 07/18/15 2333 07/19/15 0408 07/19/15 1234 07/20/15 0502  NA 117*  < > 118*  < > 122* 123* 126* 127* 130*  K <2.0*  < > 2.1*  < >  2.3* 2.8* 3.4* 3.5 3.9  CL 66*  < > 76*  < > 82* 86* 91* 92* 94*  CO2 36*  < > 28  < > 29 28 25 27 30   GLUCOSE 157*  < > 199*  < > 234* 219* 147* 167* 143*  BUN 21*  < > 22*  < > 19 19 18 16 13   CREATININE 1.31*  < > 1.38*  < > 1.22 1.05 0.96 0.87 0.79  CALCIUM 8.7*  < > 8.0*  < > 8.2* 8.2* 8.2* 8.5* 8.8*  MG 2.3  --  2.2  --   --   --   --   --   --   PHOS  --   --  2.5  --   --   --   --   --   --   < > = values in this interval not displayed. Liver Function Tests:  Recent Labs Lab 07/17/15 1956 07/18/15 0359  AST 155* 114*  ALT 61 48  ALKPHOS 120 104  BILITOT 2.2* 2.0*  PROT 5.7* 4.9*  ALBUMIN 2.8* 2.4*   No results for input(s): LIPASE, AMYLASE in  the last 168 hours. No results for input(s): AMMONIA in the last 168 hours. CBC:  Recent Labs Lab 07/17/15 1956 07/18/15 0359 07/20/15 0502  WBC 15.0* 13.3* 11.2*  NEUTROABS 12.7*  --   --   HGB 13.6 11.8* 12.5*  HCT 36.8* 32.8* 36.4*  MCV 91.8 94.3 100.6*  PLT 271 229 271    Recent Results (from the past 240 hour(s))  Urine culture     Status: None   Collection Time: 07/18/15  1:13 AM  Result Value Ref Range Status   Specimen Description URINE, CATHETERIZED  Final   Special Requests NONE  Final   Culture NO GROWTH 1 DAY  Final   Report Status 07/19/2015 FINAL  Final  MRSA PCR Screening     Status: None   Collection Time: 07/18/15  2:27 PM  Result Value Ref Range Status   MRSA by PCR NEGATIVE NEGATIVE Final    Comment:        The GeneXpert MRSA Assay (FDA approved for NASAL specimens only), is one component of a comprehensive MRSA colonization surveillance program. It is not intended to diagnose MRSA infection nor to guide or monitor treatment for MRSA infections.      Studies: Dg Chest Port 1 View  07/19/2015  CLINICAL DATA:  Productive cough EXAM: PORTABLE CHEST 1 VIEW COMPARISON:  Chest x-rays dated 07/17/2015 and 12/04/2005. FINDINGS: Heart size is upper normal, stable. Overall cardiomediastinal silhouette remains normal in size and configuration. Left chest wall pacemaker/AICD is stable in position. Lungs are clear. No pleural effusion seen. No pneumothorax seen. Osseous structures about the chest are unremarkable. IMPRESSION: Stable chest x-ray. No evidence of acute cardiopulmonary abnormality. Lungs are clear. Electronically Signed   By: Bary Richard M.D.   On: 07/19/2015 08:14    Scheduled Meds: . clindamycin (CLEOCIN) IV  900 mg Intravenous On Call to OR  . enoxaparin (LOVENOX) injection  60 mg Subcutaneous Q24H  . insulin aspart  0-9 Units Subcutaneous TID WC  . magnesium chloride  1 tablet Oral BID  . potassium chloride  40 mEq Oral Daily  . sodium  chloride  3 mL Intravenous Q12H   Continuous Infusions:    Active Problems:   Hypokalemia   Orthostatic hypotension   Chronic systolic CHF (congestive heart failure), NYHA class 2 (HCC)  Dehydration   Hyponatremia   Fracture closed, humerus   Fall    Time spent: 30 min    Amador Braddy, [facility]  [facility] Pager [phone]. If 7PM-7AM, please contact night-coverage at [email or web address], password [facility] 07/20/2015, 12:27 PM  LOS: 3 days

## 2015-07-21 ENCOUNTER — Encounter (HOSPITAL_COMMUNITY): Payer: Self-pay | Admitting: Orthopedic Surgery

## 2015-07-21 DIAGNOSIS — S4291XD Fracture of right shoulder girdle, part unspecified, subsequent encounter for fracture with routine healing: Secondary | ICD-10-CM

## 2015-07-21 DIAGNOSIS — R0600 Dyspnea, unspecified: Secondary | ICD-10-CM

## 2015-07-21 LAB — CBC
HEMATOCRIT: 33.1 % — AB (ref 39.0–52.0)
HEMOGLOBIN: 10.6 g/dL — AB (ref 13.0–17.0)
MCH: 33.3 pg (ref 26.0–34.0)
MCHC: 32 g/dL (ref 30.0–36.0)
MCV: 104.1 fL — AB (ref 78.0–100.0)
Platelets: 233 10*3/uL (ref 150–400)
RBC: 3.18 MIL/uL — ABNORMAL LOW (ref 4.22–5.81)
RDW: 13.6 % (ref 11.5–15.5)
WBC: 8.4 10*3/uL (ref 4.0–10.5)

## 2015-07-21 LAB — GLUCOSE, CAPILLARY
GLUCOSE-CAPILLARY: 111 mg/dL — AB (ref 65–99)
Glucose-Capillary: 152 mg/dL — ABNORMAL HIGH (ref 65–99)
Glucose-Capillary: 97 mg/dL (ref 65–99)
Glucose-Capillary: 107 mg/dL — ABNORMAL HIGH (ref 65–99)

## 2015-07-21 LAB — BASIC METABOLIC PANEL
ANION GAP: 7 (ref 5–15)
BUN: 13 mg/dL (ref 6–20)
CHLORIDE: 101 mmol/L (ref 101–111)
CO2: 26 mmol/L (ref 22–32)
Calcium: 8.3 mg/dL — ABNORMAL LOW (ref 8.9–10.3)
Creatinine, Ser: 0.76 mg/dL (ref 0.61–1.24)
GFR calc Af Amer: >60 mL/min (ref >60)
GLUCOSE: 159 mg/dL — AB (ref 65–99)
POTASSIUM: 4.9 mmol/L (ref 3.5–5.1)
SODIUM: 134 mmol/L — AB (ref 135–145)

## 2015-07-21 LAB — HEMOGLOBIN A1C
Hgb A1c MFr Bld: 8.2 % — ABNORMAL HIGH (ref 4.8–5.6)
Mean Plasma Glucose: 189 mg/dL

## 2015-07-21 MED ORDER — FUROSEMIDE 40 MG PO TABS
40.0000 mg | ORAL_TABLET | Freq: Every day | ORAL | Status: DC
  Administered 2015-07-21: 40 mg via ORAL
  Filled 2015-07-21 (×2): qty 1

## 2015-07-21 NOTE — Progress Notes (Signed)
Orthopedics Progress Note  Subjective: Feeling better today  Objective:  Filed Vitals:   07/21/15 0548 07/21/15 0800  BP:  100/65  Pulse:  74  Temp: 98.4 F (36.9 C)   Resp:      General: Awake and alert  Musculoskeletal: dressing intact, mod swelling, NVI Neurovascularly intact  Lab Results  Component Value Date   WBC 8.4 07/21/2015   HGB 10.6* 07/21/2015   HCT 33.1* 07/21/2015   MCV 104.1* 07/21/2015   PLT 233 07/21/2015       Component Value Date/Time   NA 134* 07/21/2015 0441   K 4.9 07/21/2015 0441   CL 101 07/21/2015 0441   CO2 26 07/21/2015 0441   GLUCOSE 159* 07/21/2015 0441   BUN 13 07/21/2015 0441   CREATININE 0.76 07/21/2015 0441   CALCIUM 8.3* 07/21/2015 0441   GFRNONAA >60 07/21/2015 0441   GFRAA >60 07/21/2015 0441    No results found for: INR, PROTIME  Assessment/Plan: POD #1 s/p Procedure(s): OPEN REDUCTION INTERNAL FIXATION (ORIF) PROXIMAL HUMERUS FRACTURE Stable this morning. Ok for discharge later this week as internal medicine directs Therapy at home per home health OT  Almedia Balls. Ranell Patrick, MD 07/21/2015 12:09 PM

## 2015-07-21 NOTE — Progress Notes (Signed)
Patient is very concerned about the fact that he is not taking his home doses of lasix and coreg. Patient states that he is concerned about a potential heart failure exacerbation. States "If we do not get ahead of this tomorrow, we are going to be in trouble. My wheezing will get worse and I will start to cough a lot." Patient is having some expiratory wheezing periodically, but lungs sound clear upon auscultation. Patient has some pitting edema in his feet. Patient states that he does not want a breathing treatment or any additional medication tonight, but is requesting that his concerns be charted and he plans to discuss with his doctors in the morning. I will pass this information along to the next shift as well and continue to monitor patient.

## 2015-07-21 NOTE — Op Note (Signed)
NAMEMARKEEM, WENDLER             ACCOUNT NO.:  [id number]  MEDICAL RECORD NO.:  [id number]  LOCATION:  [id number]                        FACILITY:  [facility]  PHYSICIAN:  Almedia Balls. Ranell Patrick, M.D. DATE OF BIRTH:  [birth year]  DATE OF PROCEDURE:  07/20/2015 DATE OF DISCHARGE:                              OPERATIVE REPORT   PREOPERATIVE DIAGNOSIS:  Displaced and comminuted right proximal humerus fracture.  POSTOPERATIVE DIAGNOSIS:  Displaced and comminuted right proximal humerus fracture.  PROCEDURE PERFORMED:  Open reduction and internal fixation of displaced and comminuted right proximal humerus fracture using Biomet SNP nail plate.  ATTENDING SURGEON:  Almedia Balls. Ranell Patrick, MD  ASSISTANT:  Donnie Coffin. Dixon, PA-C, who has scrubbed the entire procedure and necessary for satisfactory completion of surgery.  ANESTHESIA:  General anesthesia was used plus interscalene block.  ESTIMATED BLOOD LOSS:  100 mL.  FLUID REPLACEMENT:  1500 mL crystalloid.  INSTRUMENT COUNTS:  Correct.  COMPLICATIONS:  There were no complications.  ANTIBIOTICS:  Perioperative antibiotics were given.  INDICATIONS:  The patient is a 54 year old male who presents with history of the undocumented fall.  Patient states he was actually pushing up when he broke his shoulder, based on the x-rays demonstrating a comminuted displaced proximal humerus fracture.  Our suspicion is that patient fell and then may have aggravated as he pushed himself up after the fall.  The patient presented hypotensive and with significant electrolyte abnormalities and also was confused.  He has been medically optimized over last several days since admission to the hospital and was cleared for surgery for displaced and comminuted proximal humerus fracture.  The patient's humeral shaft is 100% displaced from the humeral head.  Discussed with the patient and his family the need for operative intervention to allow for appropriate alignment and  healing of the fracture, they agreed.  Informed consent obtained.  DESCRIPTION OF PROCEDURE:  After adequate level of anesthesia was achieved, patient was positioned in modified beach-chair position. Right shoulder was correctly identified and sterilely prepped and draped in usual manner.  Time-out was called.  We entered the shoulder using a standard deltopectoral incision starting at the coracoid process extending down to the anterior humerus.  Dissection down through subcutaneous tissues, identified the cephalic vein, took it laterally with deltoid, pectoralis taken medially.  The fractured humerus was immediately identified.  We used the large rongeur to remove a little bit of bone just lateral side of the biceps groove.  We then placed a Biomet SNP nail plate down the humeral shaft.  We then reduced the humerus using multiplanar C-arm and Cobb elevator.  We were able to get the humerus appropriately aligned, then placed a central guide pin.  We checked it on AP and lateral views with the C-arm and then went ahead and started placing our locked smooth pegs into the humeral head and we had greater than 40 mm length  proximally, 45 inferiorly, and 55 centrally.  Excellent stability with that proximal fragment.  We then placed 3 unicortical screws and went through the lateral cortex of the humerus and locked into the plate.  We gained a very solid fixation and felt like the alignment was excellent even though his shoulder was  severely comminuted.  We then ranged the shoulder, they moved together stably as a unit including greater tuberosity, I was able to palpate that as we rotated and thus not need for any rotator cuff repair or augmentation.  We irrigated the shoulder thoroughly in the subdeltoid deltopectoral interval and then repaired deltopectoral plane with 0 Vicryl suture followed by 2-0 Vicryl subcutaneous closure and a 4-0 Monocryl for skin.  Steri-Strips applied followed by  sterile dressing. The patient tolerated the surgery well.     Almedia Balls. Ranell Patrick, M.D.     SRN/MEDQ  D:  07/20/2015  T:  07/21/2015  Job:  [id number]

## 2015-07-21 NOTE — Anesthesia Postprocedure Evaluation (Signed)
Anesthesia Post Note  Patient: Justin Bernard  Procedure(s) Performed: Procedure(s) (LRB): OPEN REDUCTION INTERNAL FIXATION (ORIF) PROXIMAL HUMERUS FRACTURE (Right)  Patient location during evaluation: PACU Anesthesia Type: General Level of consciousness: sedated Pain management: pain level controlled Vital Signs Assessment: post-procedure vital signs reviewed and stable Respiratory status: spontaneous breathing and respiratory function stable Cardiovascular status: stable Anesthetic complications: no         Karim Aiello DANIEL

## 2015-07-21 NOTE — Progress Notes (Signed)
Occupational Therapy Evaluation Patient Details Name: Justin Bernard MRN: [id number] DOB: 20-Apr-1961 Today's Date: 07/21/2015    History of Present Illness s/p OPEN REDUCTION INTERNAL FIXATION (ORIF) PROXIMAL HUMERUS FRACTURE (Right) BIOMET SNP NAIL PLATE   Clinical Impression   Pt admitted with the above diagnoses and presents with below problem list. Pt will benefit from continued acute OT to address the below listed deficits and maximize independence with BADLs prior to d/c home with family. PTA pt was independent with ADLs. Pt is currently mod with most ADLs, min A for pivotal steps from recliner to EOB. Pt with poor standing balance this session, reporting feeling "spacey" in standing position. Denied dizziness. Pt also presents with some decreased safety awareness and problem solving. Cues needed throughout session for NWB status. Would like to see his balance improve prior to d/c home. Session details below. OT to continue to follow acutely.      Follow Up Recommendations  Supervision - Intermittent;Other (comment);Outpatient OT (OOB/mobility)    Equipment Recommendations  3 in 1 bedside comode    Recommendations for Other Services       Precautions / Restrictions Precautions Precautions: Shoulder Type of Shoulder Precautions: conservative protocol Shoulder Interventions: Shoulder sling/immobilizer;Off for dressing/bathing/exercises Precaution Booklet Issued: Yes (comment) Precaution Comments: reviewed with pt needing cues for NWB status Required Braces or Orthoses: Sling Restrictions Weight Bearing Restrictions: Yes RUE Weight Bearing: Non weight bearing      Mobility Bed Mobility Overal bed mobility: Needs Assistance Bed Mobility: Sit to Supine       Sit to supine: Min guard   General bed mobility comments: HOB flat, min guard for safety. educated on pillows under RUE  Transfers Overall transfer level: Needs assistance Equipment used: 1 person hand held  assist Transfers: Sit to/from [employer] Sit to Stand: Min assist Stand pivot transfers: Min assist       General transfer comment: from recliner to EOB. Pt noted to have increased trunk flexion and seeking external support on right side. +1 HHA to left side.     Balance Overall balance assessment: Needs assistance;History of Falls Sitting-balance support: Single extremity supported;Feet supported Sitting balance-Leahy Scale: Fair Sitting balance - Comments: Pt fatigues in sitting position. Cues to not WB on RUE.    Standing balance support: Single extremity supported;During functional activity Standing balance-Leahy Scale: Poor Standing balance comment: Pt reported feeling "sapcey" in standing position. Wide BOS. Seeking external support.                            ADL Overall ADL's : Needs assistance/impaired Eating/Feeding: Set up;Sitting   Grooming: Minimal assistance;Cueing for compensatory techniques;Cueing for UE precautions;Sitting   Upper Body Bathing: Moderate assistance;Sitting;Cueing for compensatory techniques;Cueing for UE precautions   Lower Body Bathing: Moderate assistance;Cueing for compensatory techniques;Cueing for safety;Sit to/from stand;Sitting/lateral leans   Upper Body Dressing : Sitting;Cueing for compensatory techniques;Cueing for safety;Cueing for UE precautions;Moderate assistance   Lower Body Dressing: Moderate assistance;Sit to/from stand;Cueing for compensatory techniques;Cueing for safety   Toilet Transfer: Minimal assistance;BSC;Stand-pivot Toilet Transfer Details (indicate cue type and reason): pivotal steps oonly on eval Toileting- Clothing Manipulation and Hygiene: Moderate assistance;Sitting/lateral lean;Sit to/from stand   Tub/ Shower Transfer: Walk-in shower;Minimal assistance;Stand-pivot;3 in 1   Functional mobility during ADLs:  (min A for pivotal steps) General ADL Comments: Pt completed pivotal step from  recliner to EOB. Pt reporting feeling "spacey" in standing position. Denied dizziness. Pt presenting with poor standing balance  needing min A for pivotal steps. Suspect due to medication. Pt completed elbow/wrist/digit AROM exercises in supine position. Provided shoulder handout and educated on WB status, sling protocal. Plan to educae on lap slides and supported pendulums during next OT session.      Vision     Perception     Praxis      Pertinent Vitals/Pain Pain Assessment: 0-10 Pain Score: 2  Pain Location: R shoulder Pain Intervention(s): Monitored during session;Repositioned;Premedicated before session     Hand Dominance  ("a little of both")   Extremity/Trunk Assessment Upper Extremity Assessment Upper Extremity Assessment: RUE deficits/detail RUE Deficits / Details: s/p OPEN REDUCTION INTERNAL FIXATION (ORIF) PROXIMAL HUMERUS FRACTURE (Right) BIOMET SNP NAIL PLATE RUE: Unable to fully assess due to immobilization   Lower Extremity Assessment Lower Extremity Assessment: Overall WFL for tasks assessed       Communication Communication Communication: No difficulties   Cognition Arousal/Alertness: Awake/alert Behavior During Therapy: WFL for tasks assessed/performed Overall Cognitive Status: No family/caregiver present to determine baseline cognitive functioning (some decreased safety awareness and problem solving)       Memory: Decreased recall of precautions             General Comments       Exercises Exercises: Other exercises Other Exercises Other Exercises: Rt elbow, wrist, and digit AROM in supine position, 10 reps   Shoulder Instructions      Home Living Family/patient expects to be discharged to:: Private residence Living Arrangements: Children;Other (Comment) (104 yo son) Available Help at Discharge: Family;Available PRN/intermittently;Other (Comment) Type of Home: House Home Access: Stairs to enter [employer] of Steps:  3+1 Entrance Stairs-Rails: None Home Layout: One level     Bathroom Shower/Tub: Producer, television/film/video: Standard     Home Equipment: None   Additional Comments: Pt reports family will be with him most of the time "I'll probably be by myself for at most 2 hours at a time." 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      Prior Functioning/Environment Level of Independence: Independent             OT Diagnosis: Acute pain   OT Problem List: Impaired balance (sitting and/or standing);Decreased safety awareness;Decreased knowledge of use of DME or AE;Decreased knowledge of precautions;Pain;Impaired UE functional use   OT Treatment/Interventions: Self-care/ADL training;Therapeutic exercise;DME and/or AE instruction;Therapeutic activities;Patient/family education;Balance training    OT Goals(Current goals can be found in the care plan section) Acute Rehab OT Goals Patient Stated Goal: not stated OT Goal Formulation: With patient Time For Goal Achievement: 07/28/15 Potential to Achieve Goals: Good ADL Goals Pt Will Perform Grooming: with modified independence;sitting;standing Pt Will Perform Upper Body Bathing: with modified independence;sitting Pt Will Perform Lower Body Bathing: with modified independence;sit to/from stand;sitting/lateral leans;with adaptive equipment Pt Will Perform Upper Body Dressing: with modified independence;sitting Pt Will Perform Lower Body Dressing: with modified independence;sitting/lateral leans;sit to/from stand;with adaptive equipment Pt Will Transfer to Toilet: with modified independence;ambulating (3n1 over toilet) Pt Will Perform Toileting - Clothing Manipulation and hygiene: with modified independence;sitting/lateral leans;sit to/from stand Pt Will Perform Tub/Shower Transfer: Shower transfer;with modified independence;ambulating;3 in 1 Pt/caregiver will Perform Home Exercise Program: With written HEP  provided;Independently (Conservative protocal exercises, gentle lap slides, supporte)  OT Frequency: Min 3X/week   Barriers to D/C:            Co-evaluation              End  of Session Equipment Utilized During Treatment: Gait belt;Other (comment) (sling) Nurse Communication: Other (comment) (cognition; decreased standing balance)  Activity Tolerance: Other (comment) (Pt reported feeling "spacey" in standing position. ) Patient left: in bed;with call bell/phone within reach   Time: 0902-0940 OT Time Calculation (min): 38 min Charges:  OT General Charges $OT Visit: 1 Procedure OT Evaluation $Initial OT Evaluation Tier I: 1 Procedure OT Treatments $Self Care/Home Management : 23-37 mins G-Codes:    Pilar Grammes 2015-08-06, 10:29 AM

## 2015-07-21 NOTE — Progress Notes (Signed)
[facility] PROGRESS NOTE  Justin Bernard CYE:[id number] DOB: Apr 17, 1961 DOA: 07/17/2015 PCP: No PCP Per Patient  Brief Summary  54 yo M with history of systolic heart failure [facility] at [facility] and asthma who presented with lightheadedness. Also injured his right arm, "heard a pop" when trying to pull himself up in bed the day prior to admission.  He was found to be hyponatremic to 116, hypokalemic < 2, and hypotensive 80s/40s.    Assessment/Plan   Dehydration with severe hypokalemia and severe hyponatremia due to diuretics, resolved with IVF, of potassium, magnesium supplementation and holding diuretics.  Now starting to develop some LEE.  See below    Chronic systolic heart failure, previous EF 10-15% and was VAD candidate and on transplant list for a while.  Currently, EF 50-55% on current ECHO with mild LVH -  Developing some bilateral lower extremity edema, but no rales -  Continue spironolactone -  Start lasix 40mg  daily -  Continue carvedilol 3.125 mg daily -  Case discussed with cardiologists from [facility], recommend half of previous lasix dose and continuing epleronone once euvolemic -  Resume salt restriction when diet advanced -  Repeat BMP in AM  Right humerus fracture with angulation and displacement s/p ORIF by Dr. Ranell Patrick on 12/5 -  OT recommending outpatient OT, 3-in-1 BSC  Hyperglycemia -  SSI -  A1c pending  Mild transaminitis probably due to dehydration, but ratio suggests alcohol use -  Repeat LFTs as outpatient  Fall, likely due to orthostatic hypotension from dehydration -  Ambulating without difficulty now  Diet:  regular Access:  PIV IVF:  off Proph:  lovenox  Code Status: partial Family Communication:  Patient, his mother and sister Disposition Plan:  Monitor blood pressure and electrolytes after restarting some lasix.  Possibly home Wednesday pending labs.    Consultants:  Orthopedic surgery  Procedures:  ORIF on 12/5 of right  humerus fracture  Antibiotics:  none   HPI/Subjective:  persistent swelling in his bilateral ankles.  Feels a little [name] of breath today, but right arm feeling better.    Objective: Filed Vitals:   07/21/15 0548 07/21/15 0800 07/21/15 1503 07/21/15 1733  BP:  100/65 91/62 106/71  Pulse:  74 120 107  Temp: 98.4 F (36.9 C)  99.2 F (37.3 C)   TempSrc: Oral  Oral   Resp:   18   Height:      Weight: 127.96 kg (282 lb 1.6 oz)     SpO2:  98% 91%     Intake/Output Summary (Last 24 hours) at 07/21/15 1857 Last data filed at 07/21/15 1739  Gross per 24 hour  Intake   2058 ml  Output   2850 ml  Net   -792 ml   Filed Weights   07/19/15 0527 07/20/15 0500 07/21/15 0548  Weight: 125.556 kg (276 lb 12.8 oz) 127.143 kg (280 lb 4.8 oz) 127.96 kg (282 lb 1.6 oz)   Body mass index is 34.35 kg/(m^2).  Exam:   General:  Adult male, mild tachypnea  HEENT:  NCAT, MMM  Cardiovascular:  RRR, nl S1, S2 no mrg, 2+ pulses, warm extremities  Respiratory:  CTAB, no increased WOB  Abdomen:   NABS, soft, NT/ND  MSK:   Normal tone and bulk, 1+ pitting bilateral LEE  Neuro:  Grossly intact  Data Reviewed: Basic Metabolic Panel:  Recent Labs Lab 07/17/15 1956  07/18/15 0359  07/18/15 2333 07/19/15 0408 07/19/15 1234 07/20/15 0502 07/21/15 0441  NA 117*  < >  118*  < > 123* 126* 127* 130* 134*  K <2.0*  < > 2.1*  < > 2.8* 3.4* 3.5 3.9 4.9  CL 66*  < > 76*  < > 86* 91* 92* 94* 101  CO2 36*  < > 28  < > GLUCOSE 157*  < > 199*  < > 219* 147* 167* 143* 159*  BUN 21*  < > 22*  < > CREATININE 1.31*  < > 1.38*  < > 1.05 0.96 0.87 0.79 0.76  CALCIUM 8.7*  < > 8.0*  < > 8.2* 8.2* 8.5* 8.8* 8.3*  MG 2.3  --  2.2  --   --   --   --   --   --   PHOS  --   --  2.5  --   --   --   --   --   --   < > = values in this interval not displayed. Liver Function Tests:  Recent Labs Lab 07/17/15 1956 07/18/15 0359  AST 155* 114*  ALT 61 48  ALKPHOS 120  104  BILITOT 2.2* 2.0*  PROT 5.7* 4.9*  ALBUMIN 2.8* 2.4*   No results for input(s): LIPASE, AMYLASE in the last 168 hours. No results for input(s): AMMONIA in the last 168 hours. CBC:  Recent Labs Lab 07/17/15 1956 07/18/15 0359 Aug 16, 2015 0502 07/21/15 0441  WBC 15.0* 13.3* 11.2* 8.4  NEUTROABS 12.7*  --   --   --   HGB 13.6 11.8* 12.5* 10.6*  HCT 36.8* 32.8* 36.4* 33.1*  MCV 91.8 94.3 100.6* 104.1*  PLT 271 229 271 233    Recent Results (from the past 240 hour(s))  Urine culture     Status: None   Collection Time: 07/18/15  1:13 AM  Result Value Ref Range Status   Specimen Description URINE, CATHETERIZED  Final   Special Requests NONE  Final   Culture NO GROWTH 1 DAY  Final   Report Status 07/19/2015 FINAL  Final  MRSA PCR Screening     Status: None   Collection Time: 07/18/15  2:27 PM  Result Value Ref Range Status   MRSA by PCR NEGATIVE NEGATIVE Final    Comment:        The GeneXpert MRSA Assay (FDA approved for NASAL specimens only), is one component of a comprehensive MRSA colonization surveillance program. It is not intended to diagnose MRSA infection nor to guide or monitor treatment for MRSA infections.      Studies: Dg Shoulder Right Port  2015/08/16  CLINICAL DATA:  Postop ORIF right humerus fracture EXAM: PORTABLE RIGHT SHOULDER - 2+ VIEW COMPARISON:  None. FINDINGS: Compression plate and screw fixation of a mildly comminuted right proximal humeral fracture. Near anatomic alignment of the dominant fracture fragments. Visualized right lung is clear per IMPRESSION: Status post ORIF of a mildly comminuted right proximal humeral fracture. Electronically Signed   By: Charline Bills M.D.   On: 08-16-2015 19:12    Scheduled Meds: . carvedilol  3.125 mg Oral BID WC  . enoxaparin (LOVENOX) injection  60 mg Subcutaneous Q24H  . furosemide  40 mg Oral Q breakfast  . insulin aspart  0-9 Units Subcutaneous TID WC  . magnesium chloride  1 tablet Oral BID  .  midazolam  1 mg Intravenous Once  . sodium chloride  3 mL Intravenous Q12H  . spironolactone  25 mg Oral Daily   Continuous  Infusions: . sodium chloride Stopped (07/21/15 0616)  . lactated ringers Stopped (07/20/15 2000)    Active Problems:   Hypokalemia   Orthostatic hypotension   Chronic systolic CHF (congestive heart failure), NYHA class 2 (HCC)   Dehydration   Hyponatremia   Fracture closed, humerus   Fall   Shoulder fracture    Time spent: 30 min    Rexine Gowens, [facility]  [facility] Pager [phone]. If 7PM-7AM, please contact night-coverage at [email or web address], password [facility] 07/21/2015, 6:57 PM  LOS: 4 days

## 2015-07-21 NOTE — Plan of Care (Signed)
Problem: Pain Managment: Goal: General experience of comfort will improve Outcome: Progressing Patient is receiving pain medicine as needed after ORIF of right humerus.

## 2015-07-22 DIAGNOSIS — W19XXXA Unspecified fall, initial encounter: Secondary | ICD-10-CM

## 2015-07-22 DIAGNOSIS — E871 Hypo-osmolality and hyponatremia: Secondary | ICD-10-CM

## 2015-07-22 DIAGNOSIS — E876 Hypokalemia: Secondary | ICD-10-CM

## 2015-07-22 DIAGNOSIS — I951 Orthostatic hypotension: Secondary | ICD-10-CM

## 2015-07-22 DIAGNOSIS — I5033 Acute on chronic diastolic (congestive) heart failure: Secondary | ICD-10-CM

## 2015-07-22 LAB — COMPREHENSIVE METABOLIC PANEL
ALBUMIN: 2.5 g/dL — AB (ref 3.5–5.0)
ALT: 49 U/L (ref 17–63)
ANION GAP: 7 (ref 5–15)
AST: 88 U/L — ABNORMAL HIGH (ref 15–41)
Alkaline Phosphatase: 101 U/L (ref 38–126)
BILIRUBIN TOTAL: 1.3 mg/dL — AB (ref 0.3–1.2)
BUN: 12 mg/dL (ref 6–20)
CHLORIDE: 98 mmol/L — AB (ref 101–111)
CO2: 28 mmol/L (ref 22–32)
Calcium: 8.8 mg/dL — ABNORMAL LOW (ref 8.9–10.3)
Creatinine, Ser: 0.82 mg/dL (ref 0.61–1.24)
GFR calc Af Amer: >60 mL/min (ref >60)
GFR calc non Af Amer: >60 mL/min (ref >60)
GLUCOSE: 109 mg/dL — AB (ref 65–99)
POTASSIUM: 4.1 mmol/L (ref 3.5–5.1)
SODIUM: 133 mmol/L — AB (ref 135–145)
TOTAL PROTEIN: 5.3 g/dL — AB (ref 6.5–8.1)

## 2015-07-22 LAB — CBC
HCT: 34.3 % — ABNORMAL LOW (ref 39.0–52.0)
Hemoglobin: 11 g/dL — ABNORMAL LOW (ref 13.0–17.0)
MCH: 33.4 pg (ref 26.0–34.0)
MCHC: 32.1 g/dL (ref 30.0–36.0)
MCV: 104.3 fL — ABNORMAL HIGH (ref 78.0–100.0)
PLATELETS: 249 10*3/uL (ref 150–400)
RBC: 3.29 MIL/uL — ABNORMAL LOW (ref 4.22–5.81)
RDW: 13.8 % (ref 11.5–15.5)
WBC: 7 10*3/uL (ref 4.0–10.5)

## 2015-07-22 LAB — GLUCOSE, CAPILLARY
GLUCOSE-CAPILLARY: 124 mg/dL — AB (ref 65–99)
Glucose-Capillary: 111 mg/dL — ABNORMAL HIGH (ref 65–99)
Glucose-Capillary: 113 mg/dL — ABNORMAL HIGH (ref 65–99)
Glucose-Capillary: 128 mg/dL — ABNORMAL HIGH (ref 65–99)

## 2015-07-22 MED ORDER — POTASSIUM CHLORIDE CRYS ER 20 MEQ PO TBCR
20.0000 meq | EXTENDED_RELEASE_TABLET | Freq: Every day | ORAL | Status: DC
  Administered 2015-07-22: 20 meq via ORAL
  Filled 2015-07-22 (×2): qty 1

## 2015-07-22 MED ORDER — FUROSEMIDE 10 MG/ML IJ SOLN
40.0000 mg | Freq: Two times a day (BID) | INTRAMUSCULAR | Status: DC
  Administered 2015-07-22: 40 mg via INTRAVENOUS
  Filled 2015-07-22 (×2): qty 4

## 2015-07-22 MED ORDER — FUROSEMIDE 10 MG/ML IJ SOLN
INTRAMUSCULAR | Status: AC
  Administered 2015-07-22: 40 mg
  Filled 2015-07-22: qty 4

## 2015-07-22 NOTE — Discharge Instructions (Signed)
Strict NWB with the right UE.  Only gentle exercises as prescribed by OT.  Pendulums, lap slides, and AROM wrist and elbow.  No ADLs though with the right arm.  Must wear the sling at all times while not exercising or bathing.  Keep the incision clean and dry and covered for one week, we will change the bandage in the office next week  Follow up with Dr Ranell Patrick next week in the office  [phone]

## 2015-07-22 NOTE — Progress Notes (Signed)
Orthopedics Progress Note  Subjective: Shoulder feels better, therapy going well  Objective:  Filed Vitals:   07/22/15 0832 07/22/15 1425  BP: 106/62 100/57  Pulse: 109 91  Temp:  98.1 F (36.7 C)  Resp:  18    General: Awake and alert  Musculoskeletal: right shoulder dressing clean and dry and intact. Neurovascularly intact  Lab Results  Component Value Date   WBC 7.0 07/22/2015   HGB 11.0* 07/22/2015   HCT 34.3* 07/22/2015   MCV 104.3* 07/22/2015   PLT 249 07/22/2015       Component Value Date/Time   NA 133* 07/22/2015 0435   K 4.1 07/22/2015 0435   CL 98* 07/22/2015 0435   CO2 28 07/22/2015 0435   GLUCOSE 109* 07/22/2015 0435   BUN 12 07/22/2015 0435   CREATININE 0.82 07/22/2015 0435   CALCIUM 8.8* 07/22/2015 0435   GFRNONAA >60 07/22/2015 0435   GFRAA >60 07/22/2015 0435    No results found for: INR, PROTIME  Assessment/Plan: POD #2 s/p Procedure(s): OPEN REDUCTION INTERNAL FIXATION (ORIF) PROXIMAL HUMERUS FRACTURE Stable for D/C from ortho standpoint. Will need Mr Boivin to follow up next week in the office.  Leave surgical bandage in place until then.   Strict NWB on the right UE  Almedia Balls. Ranell Patrick, MD 07/22/2015 4:47 PM

## 2015-07-22 NOTE — Progress Notes (Signed)
Occupational Therapy Treatment Patient Details Name: Justin Bernard MRN: [id number] DOB: 27-Jan-1961 Today's Date: 07/22/2015    History of present illness s/p OPEN REDUCTION INTERNAL FIXATION (ORIF) PROXIMAL HUMERUS FRACTURE (Right) BIOMET SNP NAIL PLATE   OT comments  Pt progressing towards acute OT goals. Focus of session was exercise protocol, sling management, reinforcement of NWB RUE status, bed mobility, and balance. Pt still needing cues for NWB RUE, noted to sit leaning to right with RUE positioned on knee. Educated on NWB status. Standing balance still an issue, requesting PT evaluation as pain meds no longer suspected. Session details below. OT to continue to follow acutely   Follow Up Recommendations  Supervision - Intermittent;Other (comment);Outpatient OT (mobility/OOB)    Equipment Recommendations  3 in 1 bedside comode    Recommendations for Other Services PT consult    Precautions / Restrictions Precautions Precautions: Shoulder Type of Shoulder Precautions: conservative protocol Shoulder Interventions: Shoulder sling/immobilizer;Off for dressing/bathing/exercises Precaution Booklet Issued: Yes (comment) Precaution Comments: reviewed with pt needing cues for NWB status Required Braces or Orthoses: Sling Restrictions Weight Bearing Restrictions: Yes RUE Weight Bearing: Non weight bearing       Mobility Bed Mobility Overal bed mobility: Needs Assistance Bed Mobility: Supine to Sit     Supine to sit: Min assist;HOB elevated     General bed mobility comments: min A with HOB partially elevated. strong utilization of bed rails. Reports his son can assist with bed mobility at home.   Transfers Overall transfer level: Needs assistance Equipment used: None Transfers: Sit to/from [employer] Sit to Stand: Min assist Stand pivot transfers: Min assist       General transfer comment: light min A for balance. Pt seeking external support.  Increased trunk flexion.    Balance Overall balance assessment: Needs assistance;History of Falls Sitting-balance support: Single extremity supported;Feet supported Sitting balance-Leahy Scale: Fair Sitting balance - Comments: Pt fatigues in sitting position. Cues to not WB on RUE.    Standing balance support: During functional activity;Single extremity supported Standing balance-Leahy Scale: Poor Standing balance comment: Wide BOS. Pt with noted trunk flexion able to correct somewhat with cues but difficulty maintaining. Fatigues easily.                   ADL Overall ADL's : Needs assistance/impaired                 Upper Body Dressing : Sitting;Cueing for compensatory techniques;Cueing for UE precautions;Minimal assistance Upper Body Dressing Details (indicate cue type and reason): min A to don sling     Toilet Transfer: Minimal assistance;BSC;Stand-pivot Toilet Transfer Details (indicate cue type and reason): light min A for balance to complete pivotal steps from EOB to recliner.           General ADL Comments: Pt completed exercises as detailed below. Pt completed pivotal step from recliner to EOB. Pt denies dizziness though noted to be very focused durin static standing, "I'm trying not to mess this up, it takes some concentrating." Pt took sidesteps slong side of bed with noted trunk flexion before reporting that he needed to sit back down citing B knee weakness which he reports is not at baseline. ADL, sling and NWB education reviewed. Pt noted to continue to sit leaning to the right with arm in sling propped on knee. Educated on NWB status meaning no weight at all including touching knee. Pt reported he was sitting like this due to preparing to stand but noted to return to  this position even when therapist stated he was not standing right away.      Vision                     Perception     Praxis      Cognition   Behavior During Therapy: WFL for  tasks assessed/performed Overall Cognitive Status: No family/caregiver present to determine baseline cognitive functioning (cues for NWB, fair recall of exercises from previous day)       Memory: Decreased recall of precautions               Extremity/Trunk Assessment               Exercises Other Exercises Other Exercises: Rt elbow, wrist, and digit AROM in supine position, 10 reps. Completed 5 reps lap slides in seated position. Completed 5 reps gentle pendulums in seated position, with no increase of pain reported.    Shoulder Instructions       General Comments      Pertinent Vitals/ Pain       Pain Assessment: Faces Faces Pain Scale: Hurts a little bit Pain Location: Rt shoulder, "it doesn't really hurt" Pain Intervention(s): Monitored during session;Repositioned  Home Living                                          Prior Functioning/Environment              Frequency Min 3X/week     Progress Toward Goals  OT Goals(current goals can now be found in the care plan section)  Progress towards OT goals: Progressing toward goals  Acute Rehab OT Goals Patient Stated Goal: not stated OT Goal Formulation: With patient Time For Goal Achievement: 07/28/15 Potential to Achieve Goals: Good ADL Goals Pt Will Perform Grooming: with modified independence;sitting;standing Pt Will Perform Upper Body Bathing: with modified independence;sitting Pt Will Perform Lower Body Bathing: with modified independence;sit to/from stand;sitting/lateral leans;with adaptive equipment Pt Will Perform Upper Body Dressing: with modified independence;sitting Pt Will Perform Lower Body Dressing: with modified independence;sitting/lateral leans;sit to/from stand;with adaptive equipment Pt Will Transfer to Toilet: with modified independence;ambulating Pt Will Perform Toileting - Clothing Manipulation and hygiene: with modified independence;sitting/lateral leans;sit  to/from stand Pt Will Perform Tub/Shower Transfer: Shower transfer;with modified independence;ambulating;3 in 1 Pt/caregiver will Perform Home Exercise Program: With written HEP provided;Independently  Plan Discharge plan remains appropriate    Co-evaluation                 End of Session Equipment Utilized During Treatment: Gait belt;Other (comment) (sling)   Activity Tolerance Patient tolerated treatment well;Patient limited by fatigue;Other (comment) (fatigued easily in standing position)   Patient Left in chair;with call bell/phone within reach   Nurse Communication          Time: 3428-7681 OT Time Calculation (min): 26 min  Charges: OT General Charges $OT Visit: 1 Procedure OT Treatments $Self Care/Home Management : 8-22 mins $Therapeutic Exercise: 8-22 mins  Pilar Grammes 07/22/2015, 11:40 AM

## 2015-07-22 NOTE — Consult Note (Addendum)
Advanced Heart Failure Team Consult Note  Referring Physician: Dr Jerral Ralph Primary Physician: None Primary Cardiologist:  Dr Aundria Rud ([facility]), previously Dr Aundria Rud ([place], [place])  Reason for Consultation: Hx of Chronic systolic CHF, previous VAD/transplant candidate  HPI:    Justin Bernard is a 54 y.o. male with history of CHF (NICM) and asthma who presented to [facility] on 07/17/15 with R shoulder pain, lightheadedness, and low BP.  He was followed at [facility] in the past for EF of 10-15% (reported) with VAD/Txp work up.   Pertinent admission labs include K < 2 , Creat 1.3, Na 117, WBC 15, BNP 63.2, Troponin negative.  R Shoulder X-ray with commuted, angulated, and displaced proximal right humerus fracture involving surgical neck.  CXR 07/19/15 stable with no acute abnormalities and clear lungs.   Echo 07/18/15 with EF 50-55% with no valvular disease, diastolic dysfunction, or regional wall abnormalities.  HF first noted in 07/2010 per patient when he presented with SOB.  He was seen at [facility].  Had previously been told he had cardiomegaly on a CXR (4-5 years prior) but no work up with no symptoms. Of note, Mother had HF around 57 and grandfather had heart failure around 31. Pt's disease thought to be NICM with clean cath as below. 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 He is s/p Environmental manager ICD implant 08/2010  He states he first began feeling dizzy approx 2 weeks prior to this admission, just prior to Thanksgiving.  Denies dietary indiscretion. Has made meals from scratch for many years and does not use salt. Denies significant fluid intake or medication non compliance.  BPs were low at home over the same time period so he did cut his coreg in half to 12.5 mg BID. Denies DOE, CP, or edema at  home. He denies any SOB and has no limitation of physical activity. No orthopnea or PND. States he was getting lightheaded/dizzy, thought to be 2/2 low potassium. Has had some coughing with deep breathing this admission. He is unsure whether or not he fell at home, and has no memory of any acute injury to his R shoulder.  Thinks it may have been done pulling himself into bed.  SH: He lives with his 2 nephews and son in [place].   NucMedCardiacPerfusionStudy 08/13/10 [facility], [place]: - Areas or reversibility in anterior wall and lesser extent inferior wall. Dilated LV with EF 15% and severe hypokinesis LHC 08/31/10 [facility] [place]: Right dominant. Left main free of disease, branches into LAD and cicrumflex. Large caliber vessels with no significant stenosis. No intimal irregularities in RCA. Elevated LVEDP at 40 ECHO 08/12/2010 [facility], [place]: EF <24% ECHO 09/27/2010 [place], [place]: EF 15% with moderate RVE with marked dysfunction, moderate MR/TR, trace AR, mild PR, moderate LV dilation, moderate RV dilation, Severe LAE, mild RAE.  ECHO 01/08/2012 [place]: EF <25%, Grade 1 DD, MR/TR significantly improved. ECHO 09/21/2011 [place]: EF 45-55%, Grade 1 DD, RV mildly dilated and reduced, Trace MR/PR/TR, no AR. ECHO 03/21/2012 [place]: EF 45%, Grade 1 DD, Trace MR,PR,TR.    Review of Systems: [y] = yes, [ ]  = no   General: Weight gain [y]; Weight loss [ ] ; Anorexia [ ] ; Fatigue [ ] ; Fever [ ] ; Chills [ ] ; Weakness [ ]   Cardiac: Chest pain/pressure [ ] ; Resting  SOB [ ] ; Exertional SOB [ ] ; Orthopnea [ ] ; Pedal Edema [y]; Palpitations [ ] ; Syncope [ ] ; Presyncope [ ] ; Paroxysmal nocturnal dyspnea[ ]   Pulmonary: Cough [y]; Wheezing[ ] ; Hemoptysis[ ] ; Sputum [ ] ; Snoring [ ]   GI: Vomiting[ ] ; Dysphagia[ ] ; Melena[ ] ; Hematochezia [ ] ; Heartburn[ ] ; Abdominal pain [ ] ; Constipation [ ] ; Diarrhea [ ] ; BRBPR [ ]   GU: Hematuria[ ] ; Dysuria [ ] ; Nocturia[ ]   Vascular: Pain in legs with walking [  ]; Pain in feet with lying flat [ ] ; Non-healing sores [ ] ; Stroke [ ] ; TIA [ ] ; Slurred speech [ ] ;  Neuro: Headaches[ ] ; Vertigo[ ] ; Seizures[ ] ; Paresthesias[ ] ;Blurred vision [ ] ; Diplopia [ ] ; Vision changes [ ]   Ortho/Skin: Arthritis [y]; Joint pain [y]; Muscle pain [ ] ; Joint swelling [ ] ; Back Pain [ ] ; Rash [ ]   Psych: Depression[ ] ; Anxiety[ ]   Heme: Bleeding problems [ ] ; Clotting disorders [ ] ; Anemia [ ]   Endocrine: Diabetes [ ] ; Thyroid dysfunction[ ]   Home Medications Prior to Admission medications   Medication Sig Start Date End Date Taking? Authorizing Provider  carvedilol (COREG) 25 MG tablet Take 12.5 mg by mouth 2 (two) times daily with a meal.  06/04/15  Yes Historical Provider, MD  eplerenone (INSPRA) 25 MG tablet Take 25 mg by mouth daily. 06/04/15  Yes Historical Provider, MD  furosemide (LASIX) 40 MG tablet Take 40 mg by mouth 2 (two) times daily. 05/31/15  Yes Historical Provider, MD  HYDROcodone-acetaminophen (NORCO) 5-325 MG tablet Take 1 tablet by mouth every 6 (six) hours as needed for moderate pain. 07/20/15   Beverely Low, MD  Probiotic Product (PROBIOTIC PO) Take 1 tablet by mouth daily.   Yes Historical Provider, MD    Past Medical History: Past Medical History  Diagnosis Date  . Chronic systolic heart failure (HCC)     NICM, previous heart transplant candidate with EF 10%  . Asthma     Past Surgical History: Past Surgical History  Procedure Laterality Date  . Back surgery    . Icd    . Orif humerus fracture Right 07/20/2015    Procedure: OPEN REDUCTION INTERNAL FIXATION (ORIF) PROXIMAL HUMERUS FRACTURE;  Surgeon: Beverely Low, MD;  Location: [facility] OR;  Service: Orthopedics;  Laterality: Right;    Family History: Family History  Problem Relation Age of Onset  . Hypertension Other   . CAD Other   . Heart failure Mother   . Hypertension Mother   . CAD Father     Social History: Social History   Social History  . Marital Status: Married     Spouse Name: N/A  . Number of Children: N/A  . Years of Education: N/A   Social History Main Topics  . Smoking status: Never Smoker   . Smokeless tobacco: Never Used  . Alcohol Use: No  . Drug Use: No  . Sexual Activity: Not Asked   Other Topics Concern  . None   Social History Narrative    Allergies:  Allergies  Allergen Reactions  . Penicillins Other (See Comments)    Possible childhood reaction    Objective:    Vital Signs:   Temp:  [98.4 F (36.9 C)-99.2 F (37.3 C)] 98.8 F (37.1 C) (12/07 0325) Pulse Rate:  [97-120] 109 (12/07 0832) Resp:  [16-18] 16 (12/07 0325) BP: (91-106)/(62-71) 106/62 mmHg (12/07 0832) SpO2:  [91 %-97 %] 97 % (12/07 0325) Weight:  [286 lb 9.6 oz (130.001 kg)] 286  lb 9.6 oz (130.001 kg) (12/07 0325) Last BM Date: 07/19/15  Weight change: Filed Weights   07/20/15 0500 07/21/15 0548 07/22/15 0325  Weight: 280 lb 4.8 oz (127.143 kg) 282 lb 1.6 oz (127.96 kg) 286 lb 9.6 oz (130.001 kg)    Intake/Output:   Intake/Output Summary (Last 24 hours) at 07/22/15 0912 Last data filed at 07/22/15 0830  Gross per 24 hour  Intake   1563 ml  Output   3025 ml  Net  -1462 ml     Physical Exam: General:  Well appearing. No resp difficulty HEENT: normal Neck: supple. JVP 8-9 cm. Carotids 2+ bilat; no bruits. No thyromegaly or nodule noted. Cor: PMI nondisplaced. RRR, mildly tachy. No rubs, gallops or murmurs. ICD in place L chest. Lungs: clear Abdomen: Obese, soft, nontender, nondistended. No HSM. No bruits or masses. +BS Extremities: no cyanosis, clubbing, rash. 1+ pre tibial edema.  Trace to 1+ RUE edema s.p ORIF  Neuro: alert & orientedx3, cranial nerves grossly intact. moves all 4 extremities w/o difficulty. Affect pleasant  Telemetry: NSR 90-100s  Labs: Basic Metabolic Panel:  Recent Labs Lab 07/17/15 1956  07/18/15 0359  07/19/15 0408 07/19/15 1234 07/20/15 0502 07/21/15 0441 07/22/15 0435  NA 117*  < > 118*  < > 126* 127*  130* 134* 133*  K <2.0*  < > 2.1*  < > 3.4* 3.5 3.9 4.9 4.1  CL 66*  < > 76*  < > 91* 92* 94* 101 98*  CO2 36*  < > 28  < > 25 27 30 26 28   GLUCOSE 157*  < > 199*  < > 147* 167* 143* 159* 109*  BUN 21*  < > 22*  < > 18 16 13 13 12   CREATININE 1.31*  < > 1.38*  < > 0.96 0.87 0.79 0.76 0.82  CALCIUM 8.7*  < > 8.0*  < > 8.2* 8.5* 8.8* 8.3* 8.8*  MG 2.3  --  2.2  --   --   --   --   --   --   PHOS  --   --  2.5  --   --   --   --   --   --   < > = values in this interval not displayed.  Liver Function Tests:  Recent Labs Lab 07/17/15 1956 07/18/15 0359 07/22/15 0435  AST 155* 114* 88*  ALT 61 48 49  ALKPHOS 120 104 101  BILITOT 2.2* 2.0* 1.3*  PROT 5.7* 4.9* 5.3*  ALBUMIN 2.8* 2.4* 2.5*   No results for input(s): LIPASE, AMYLASE in the last 168 hours. No results for input(s): AMMONIA in the last 168 hours.  CBC:  Recent Labs Lab 07/17/15 1956 07/18/15 0359 07/20/15 0502 07/21/15 0441 07/22/15 0435  WBC 15.0* 13.3* 11.2* 8.4 7.0  NEUTROABS 12.7*  --   --   --   --   HGB 13.6 11.8* 12.5* 10.6* 11.0*  HCT 36.8* 32.8* 36.4* 33.1* 34.3*  MCV 91.8 94.3 100.6* 104.1* 104.3*  PLT 271 229 271 233 249    Cardiac Enzymes:  Recent Labs Lab 07/18/15 0353 07/18/15 0940 07/18/15 1606  TROPONINI 0.03 0.04* 0.03    BNP: BNP (last 3 results)  Recent Labs  07/17/15 1956  BNP 63.4    ProBNP (last 3 results) No results for input(s): PROBNP in the last 8760 hours.   CBG:  Recent Labs Lab 07/21/15 0728 07/21/15 1123 07/21/15 1627 07/21/15 2104 07/22/15 0739  GLUCAP 152* 97  107* 111* 111*    Coagulation Studies: No results for input(s): LABPROT, INR in the last 72 hours.  Other results: EKG: NSR, nonspecific T wave abnormalities, long QT (may be related to low K).   Imaging: Dg Shoulder Right Port  07/20/2015  CLINICAL DATA:  Postop ORIF right humerus fracture EXAM: PORTABLE RIGHT SHOULDER - 2+ VIEW COMPARISON:  None. FINDINGS: Compression plate and screw  fixation of a mildly comminuted right proximal humeral fracture. Near anatomic alignment of the dominant fracture fragments. Visualized right lung is clear per IMPRESSION: Status post ORIF of a mildly comminuted right proximal humeral fracture. Electronically Signed   By: Charline Bills M.D.   On: 07/20/2015 19:12      Medications:     Current Medications: . carvedilol  3.125 mg Oral BID WC  . enoxaparin (LOVENOX) injection  60 mg Subcutaneous Q24H  . furosemide  40 mg Intravenous BID  . insulin aspart  0-9 Units Subcutaneous TID WC  . magnesium chloride  1 tablet Oral BID  . midazolam  1 mg Intravenous Once  . potassium chloride  20 mEq Oral Daily  . sodium chloride  3 mL Intravenous Q12H  . spironolactone  25 mg Oral Daily     Infusions: . lactated ringers Stopped (07/20/15 2000)      Assessment/Plan   1. Hx of Chronic systolic HF - EF now 50-55% (Echo 07/18/15) - EF had previously been 10-15% and was worked up as Probation officer in [place]. Had previously been seen at [facility].  - He is up 8 lbs since admit. HF meds held on admission with hypotension thought to be 2/2 overdiuresis with severe hyponatremia and hypokalemia on admit.. Now on 40 mg IV lasix BID, coreg 3.125 BID and spiro 25 mg daily. Agree with current regimen. Pt notes brisk diuresis since being started back on lasix. Out > 1L already this am.  - Pt is unsure why he is using epleronone instead of spironolactone.  Denies any known issues with spiro.  - s/p [employer] ICD placement 08/2010. States home telemetry is broken. Will have interrogated here and will also need follow up with EP. [employer] rep aware. 2. R Humerus fx s/p ORIF 07/20/15 - OT recommending outpatient OT - Per primary team/Ortho 3. Hyperglycemia  - A1C 8.2 07/20/15. Per primary team.  Thank you for consulting [place], we will follow this admission and set up for outpatient follow up in HF clinic.   Length of Stay: 5  Graciella Freer PA-C 07/22/2015, 9:12 AM  Advanced Heart Failure Team Pager [phone] (M-F; 7a - 4p)  Please contact [facility] for night-coverage after hours (4p -7a ) and weekends on [email or web address]  Patient seen with PA, agree with the above note.  1. Right humeral fracture: Timeline here is difficult.  It sounds like he may have fallen because of severe orthostasis.  BP was running very low prior to admission.  No definite syncope but cannot rule out.  2. Hypotension: Patient reports BP was low for about 2 wks prior to admission.  SBP got as low as 60s on home readings.  He was lightheaded with standing.  As above, arm fracture may have been related to orthostasis and fall.  No definite trigger for fall in BP.  No GI or other illness.  No change in medications.  He was on Coreg 25 mg bid and decreased to 12.5 mg bid on his own prior to admission.  BP now stable on Coreg 3.125  mg bid.  He is no longer lightheaded.   - Continue Coreg 3.125 mg bid, will increase gradually as able.  3. 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 EF had risen over the years.  It remains 50-55% on echo done here this admission.  He developed volume overload post-op, weight up 8 lbs.  New lower leg swelling, mild JVD.   - Lasix 40 mg IV bid today, likely back to po tomorrow.  Keep K replete.  - Continue spironolactone 25 mg daily. - Coreg 3.125 mg bid, will try to gradually titrate up.   - Unclear as to why he was never on ACEI or ARB (does not remember taking either).  Would not start now with EF near-normal and BP low.  - Interrogate [employer] ICD.  - Ted hose.  4. Hyperglycemia: HgbA1c elevated, suspect new diagnosis of DM.  Per [facility].   Marca Ancona 07/22/2015 11:01 AM

## 2015-07-22 NOTE — Progress Notes (Signed)
PATIENT DETAILS Name: Justin Bernard Age: 54 y.o. Sex: male Date of Birth: 03-Jan-1961 Admit Date: 07/17/2015 Admitting Physician Therisa Doyne, MD PCP:No PCP Per Patient  Brief Narrative: Justin Bernard is a 54 y.o. male with history of CHF (NICM) and asthma who presented tot he hospital for right shoulder pain following a ?fall. R Shoulder X-ray with commuted, angulated, and displaced proximal right humerus fracture involving surgical neck.He was found to have severe hypokalemia and hyponatremia on admission-these were felt likely secondary to dehydration from diuretics. Patient underwent ORIF on 12/5. His electrolytes were corrected with supplementation and gentle hydration. Hospital course has been complicated by development of mild decompensated CHF. Please see below for details.  Subjective: Worsening leg edema-wants to establish care here in [place] with a cardiologist.   Assessment/Plan: Active Problems: Dehydration with severe hypokalemia and severe hyponatremia: due to diuretics, resolved with IVF  Right humerus fracture with angulation and displacement:s/p ORIF by Dr. Ranell Patrick on 12/5.Per Ortho ok for discharge, will likely require HHPT/OT. Continue supportive care.   Acute on Chronic Systolic CHF: Suspect from IVF,Diuretics were held on admit-IV Lasix for today-reassess in am. Patient now wants to establish with CHF team here in [place]-have consulted CHF team   Fall/?Syncope:suspect from dehydration/orthostatics-Cards planning on interrogation of ICD.   Newly diagnosed DM-2:CBG'S noted to be elevated-A1C elevated at 8.2-will plan on starting Metformin on discharge.   Transaminitis:resolving-?ETOH use-but denies.   Disposition: Remain inpatient  Antimicrobial agents  See below  Anti-infectives    Start     Dose/Rate Route Frequency Ordered Stop   07/20/15 2200  clindamycin (CLEOCIN) IVPB 600 mg     600 mg 100 mL/hr over 30 Minutes Intravenous Every 6  hours 07/20/15 1946 07/21/15 1057   07/20/15 0600  clindamycin (CLEOCIN) IVPB 900 mg     900 mg 100 mL/hr over 30 Minutes Intravenous On call to O.R. 07/19/15 2110 07/20/15 1650      DVT Prophylaxis: Prophylactic Lovenox   Code Status: Full code   Family Communication None at bedside  Procedures:  ORIF by Dr. Ranell Patrick on 12/5.  CONSULTS:  cardiology and orthopedic surgery  Time spent 30 minutes-Greater than 50% of this time was spent in counseling, explanation of diagnosis, planning of further management, and coordination of care.  MEDICATIONS: Scheduled Meds: . carvedilol  3.125 mg Oral BID WC  . enoxaparin (LOVENOX) injection  60 mg Subcutaneous Q24H  . furosemide  40 mg Intravenous BID  . insulin aspart  0-9 Units Subcutaneous TID WC  . magnesium chloride  1 tablet Oral BID  . midazolam  1 mg Intravenous Once  . potassium chloride  20 mEq Oral Daily  . sodium chloride  3 mL Intravenous Q12H  . spironolactone  25 mg Oral Daily   Continuous Infusions: . lactated ringers Stopped (07/20/15 2000)   PRN Meds:.acetaminophen **OR** acetaminophen, bisacodyl, HYDROcodone-acetaminophen, menthol-cetylpyridinium **OR** phenol, methocarbamol **OR** methocarbamol (ROBAXIN)  IV, metoCLOPramide **OR** metoCLOPramide (REGLAN) injection, morphine injection, ondansetron **OR** ondansetron (ZOFRAN) IV, polyethylene glycol    PHYSICAL EXAM: Vital signs in last 24 hours: Filed Vitals:   07/21/15 1733 07/21/15 2116 07/22/15 0325 07/22/15 0832  BP: 106/71 105/64 99/66 106/62  Pulse: 107 106 97 109  Temp:  98.4 F (36.9 C) 98.8 F (37.1 C)   TempSrc:  Oral Oral   Resp:  18 16   Height:      Weight:   130.001 kg (286  lb 9.6 oz)   SpO2:  94% 97%     Weight change: 2.041 kg (4 lb 8 oz) Filed Weights   07/20/15 0500 07/21/15 0548 07/22/15 0325  Weight: 127.143 kg (280 lb 4.8 oz) 127.96 kg (282 lb 1.6 oz) 130.001 kg (286 lb 9.6 oz)   Body mass index is 34.9 kg/(m^2).   Gen Exam:  Awake and alert with clear speech.   Neck: Supple, No JVD.   Chest: B/L Clear.   CVS: S1 S2 Regular, no murmurs.  Abdomen: soft, BS +, non tender, non distended.  Extremities: 1+ edema, lower extremities warm to touch. Neurologic: Non Focal.   Skin: No Rash.   Wounds: N/A.   Intake/Output from previous day:  Intake/Output Summary (Last 24 hours) at 07/22/15 1324 Last data filed at 07/22/15 1001  Gross per 24 hour  Intake   1323 ml  Output   3225 ml  Net  -1902 ml     LAB RESULTS: CBC  Recent Labs Lab 07/17/15 1956 07/18/15 0359 07/20/15 0502 07/21/15 0441 07/22/15 0435  WBC 15.0* 13.3* 11.2* 8.4 7.0  HGB 13.6 11.8* 12.5* 10.6* 11.0*  HCT 36.8* 32.8* 36.4* 33.1* 34.3*  PLT 271 229 271 233 249  MCV 91.8 94.3 100.6* 104.1* 104.3*  MCH 33.9 33.9 34.5* 33.3 33.4  MCHC 37.0* 36.0 34.3 32.0 32.1  RDW 12.6 12.7 13.2 13.6 13.8  LYMPHSABS 1.6  --   --   --   --   MONOABS 0.7  --   --   --   --   EOSABS 0.0  --   --   --   --   BASOSABS 0.0  --   --   --   --     Chemistries   Recent Labs Lab 07/17/15 1956  07/18/15 0359  07/19/15 0408 07/19/15 1234 07/20/15 0502 07/21/15 0441 07/22/15 0435  NA 117*  < > 118*  < > 126* 127* 130* 134* 133*  K <2.0*  < > 2.1*  < > 3.4* 3.5 3.9 4.9 4.1  CL 66*  < > 76*  < > 91* 92* 94* 101 98*  CO2 36*  < > 28  < > 25 27 30 26 28   GLUCOSE 157*  < > 199*  < > 147* 167* 143* 159* 109*  BUN 21*  < > 22*  < > 18 16 13 13 12   CREATININE 1.31*  < > 1.38*  < > 0.96 0.87 0.79 0.76 0.82  CALCIUM 8.7*  < > 8.0*  < > 8.2* 8.5* 8.8* 8.3* 8.8*  MG 2.3  --  2.2  --   --   --   --   --   --   < > = values in this interval not displayed.  CBG:  Recent Labs Lab 07/21/15 1123 07/21/15 1627 07/21/15 2104 07/22/15 0739 07/22/15 1133  GLUCAP 97 107* 111* 111* 124*    GFR Estimated Creatinine Clearance: 155.2 mL/min (by C-G formula based on Cr of 0.82).  Coagulation profile No results for input(s): INR, PROTIME in the last 168  hours.  Cardiac Enzymes  Recent Labs Lab 07/18/15 0353 07/18/15 0940 07/18/15 1606  TROPONINI 0.03 0.04* 0.03    Invalid input(s): POCBNP No results for input(s): DDIMER in the last 72 hours.  Recent Labs  07/20/15 0502  HGBA1C 8.2*   No results for input(s): CHOL, HDL, LDLCALC, TRIG, CHOLHDL, LDLDIRECT in the last 72 hours. No results for input(s): TSH, T4TOTAL,  T3FREE, THYROIDAB in the last 72 hours.  Invalid input(s): FREET3 No results for input(s): VITAMINB12, FOLATE, FERRITIN, TIBC, IRON, RETICCTPCT in the last 72 hours. No results for input(s): LIPASE, AMYLASE in the last 72 hours.  Urine Studies No results for input(s): UHGB, CRYS in the last 72 hours.  Invalid input(s): UACOL, UAPR, USPG, UPH, UTP, UGL, UKET, UBIL, UNIT, UROB, ULEU, UEPI, UWBC, URBC, UBAC, CAST, UCOM, BILUA  MICROBIOLOGY: Recent Results (from the past 240 hour(s))  Urine culture     Status: None   Collection Time: 07/18/15  1:13 AM  Result Value Ref Range Status   Specimen Description URINE, CATHETERIZED  Final   Special Requests NONE  Final   Culture NO GROWTH 1 DAY  Final   Report Status 07/19/2015 FINAL  Final  MRSA PCR Screening     Status: None   Collection Time: 07/18/15  2:27 PM  Result Value Ref Range Status   MRSA by PCR NEGATIVE NEGATIVE Final    Comment:        The GeneXpert MRSA Assay (FDA approved for NASAL specimens only), is one component of a comprehensive MRSA colonization surveillance program. It is not intended to diagnose MRSA infection nor to guide or monitor treatment for MRSA infections.     RADIOLOGY STUDIES/RESULTS: Dg Chest 1 View  07/17/2015  CLINICAL DATA:  54 year old male with hypotension. Medical history of CHF and asthma. EXAM: CHEST 1 VIEW COMPARISON:  Chest radiograph dated 12/04/2005 FINDINGS: Single frontal view of the chest does not demonstrate any focal consolidation, pleural effusion, or pneumothorax. Left lung base clear linear plaque likely  atelectatic changes noted. The cardiac silhouette is within normal limits. Left pectoral AICD device. The osseous structures are grossly unremarkable. IMPRESSION: No active disease. Electronically Signed   By: Elgie Collard M.D.   On: 07/17/2015 20:54   Dg Shoulder Right  07/17/2015  CLINICAL DATA:  Right shoulder injury EXAM: RIGHT SHOULDER - 2+ VIEW COMPARISON:  None. FINDINGS: There is a comminuted right proximal humerus fracture involving the surgical neck, with mild apex lateral angulation, slight overriding of the fracture fragments and 1 shaft's with anterior displacement of the dominant distal fracture fragment. No dislocation at the right glenohumeral joint. 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 IMPRESSION: Comminuted, angulated and displaced proximal right humerus fracture involving the surgical neck. Electronically Signed   By: [name] M.D.   On: 07/17/2015 17:44   Dg Chest Port 1 View  07/19/2015  CLINICAL DATA:  Productive cough EXAM: PORTABLE CHEST 1 VIEW COMPARISON:  Chest x-rays dated 07/17/2015 and 12/04/2005. FINDINGS: Heart size is upper normal, stable. Overall cardiomediastinal silhouette remains normal in size and configuration. Left chest wall pacemaker/AICD is stable in position. Lungs are clear. No pleural effusion seen. No pneumothorax seen. Osseous structures about the chest are unremarkable. IMPRESSION: Stable chest x-ray. No evidence of acute cardiopulmonary abnormality. Lungs are clear. Electronically Signed   By: Bary Richard M.D.   On: 07/19/2015 08:14   Dg Shoulder Right Port  07/20/2015  CLINICAL DATA:  Postop ORIF right humerus fracture EXAM: PORTABLE RIGHT SHOULDER - 2+ VIEW COMPARISON:  None. FINDINGS: Compression plate and screw fixation of a mildly comminuted right proximal humeral fracture. Near anatomic alignment of the dominant fracture fragments. Visualized right lung is clear per IMPRESSION: Status post ORIF of a mildly comminuted right  proximal humeral fracture. Electronically Signed   By: Charline Bills M.D.   On: 07/20/2015 19:12    Jeoffrey Massed, MD  [facility] Pager:[phone]  If 7PM-7AM, please contact night-coverage [email or web address] Password [email or web address] 07/22/2015, 1:24 PM   LOS: 5 days

## 2015-07-22 NOTE — Plan of Care (Signed)
Problem: Pain Managment: Goal: General experience of comfort will improve Outcome: Progressing Patient is receiving appropriate pain management after his ORIF of the right shoulder. Patient is able to verbalize need for pain medicine as well as other interventions such as cold therapy.  Problem: Skin Integrity: Goal: Risk for impaired skin integrity will decrease Outcome: Completed/Met Date Met:  07/22/15 Patient is able to turn himself in the bed and has also been able to get up into the chair periodically. Patient's skin is intact; patient is not at a high risk for breakdown per the Braden scale.

## 2015-07-23 DIAGNOSIS — S42302D Unspecified fracture of shaft of humerus, left arm, subsequent encounter for fracture with routine healing: Secondary | ICD-10-CM

## 2015-07-23 DIAGNOSIS — I5022 Chronic systolic (congestive) heart failure: Secondary | ICD-10-CM

## 2015-07-23 DIAGNOSIS — I5041 Acute combined systolic (congestive) and diastolic (congestive) heart failure: Secondary | ICD-10-CM

## 2015-07-23 LAB — COMPREHENSIVE METABOLIC PANEL
ALBUMIN: 2.4 g/dL — AB (ref 3.5–5.0)
ALT: 47 U/L (ref 17–63)
ANION GAP: 10 (ref 5–15)
AST: 84 U/L — ABNORMAL HIGH (ref 15–41)
Alkaline Phosphatase: 98 U/L (ref 38–126)
BUN: 12 mg/dL (ref 6–20)
CHLORIDE: 100 mmol/L — AB (ref 101–111)
CO2: 26 mmol/L (ref 22–32)
Calcium: 8.7 mg/dL — ABNORMAL LOW (ref 8.9–10.3)
Creatinine, Ser: 0.82 mg/dL (ref 0.61–1.24)
GFR calc non Af Amer: >60 mL/min (ref >60)
GLUCOSE: 114 mg/dL — AB (ref 65–99)
POTASSIUM: 3.5 mmol/L (ref 3.5–5.1)
SODIUM: 136 mmol/L (ref 135–145)
Total Bilirubin: 1.3 mg/dL — ABNORMAL HIGH (ref 0.3–1.2)
Total Protein: 4.8 g/dL — ABNORMAL LOW (ref 6.5–8.1)

## 2015-07-23 LAB — GLUCOSE, CAPILLARY
Glucose-Capillary: 111 mg/dL — ABNORMAL HIGH (ref 65–99)
Glucose-Capillary: 102 mg/dL — ABNORMAL HIGH (ref 65–99)

## 2015-07-23 LAB — CBC
HCT: 32.8 % — ABNORMAL LOW (ref 39.0–52.0)
Hemoglobin: 10.6 g/dL — ABNORMAL LOW (ref 13.0–17.0)
MCH: 33.1 pg (ref 26.0–34.0)
MCHC: 32.3 g/dL (ref 30.0–36.0)
MCV: 102.5 fL — ABNORMAL HIGH (ref 78.0–100.0)
PLATELETS: 227 10*3/uL (ref 150–400)
RBC: 3.2 MIL/uL — AB (ref 4.22–5.81)
RDW: 13.7 % (ref 11.5–15.5)
WBC: 6.2 10*3/uL (ref 4.0–10.5)

## 2015-07-23 MED ORDER — POTASSIUM CHLORIDE CRYS ER 20 MEQ PO TBCR
20.0000 meq | EXTENDED_RELEASE_TABLET | Freq: Every day | ORAL | Status: DC
  Administered 2015-07-23: 20 meq via ORAL

## 2015-07-23 MED ORDER — POLYETHYLENE GLYCOL 3350 17 G PO PACK: 17.0000 g | PACK | Freq: Every day | ORAL | Status: DC | PRN

## 2015-07-23 MED ORDER — HYDROCODONE-ACETAMINOPHEN 5-325 MG PO TABS: 1.0000 | ORAL_TABLET | Freq: Four times a day (QID) | ORAL | Status: DC | PRN

## 2015-07-23 MED ORDER — SPIRONOLACTONE 25 MG PO TABS: 25.0000 mg | ORAL_TABLET | Freq: Every day | ORAL | Status: DC

## 2015-07-23 MED ORDER — CARVEDILOL 6.25 MG PO TABS
6.2500 mg | ORAL_TABLET | Freq: Two times a day (BID) | ORAL | Status: DC
  Administered 2015-07-23: 6.25 mg via ORAL
  Filled 2015-07-23: qty 1

## 2015-07-23 MED ORDER — POTASSIUM CHLORIDE CRYS ER 20 MEQ PO TBCR: 20.0000 meq | EXTENDED_RELEASE_TABLET | Freq: Every day | ORAL | Status: DC

## 2015-07-23 MED ORDER — FUROSEMIDE 40 MG PO TABS
40.0000 mg | ORAL_TABLET | Freq: Every day | ORAL | Status: DC
  Administered 2015-07-23: 40 mg via ORAL
  Filled 2015-07-23: qty 1

## 2015-07-23 MED ORDER — FREESTYLE SYSTEM KIT: 1.0000 | PACK | Freq: Three times a day (TID) | Status: AC

## 2015-07-23 MED ORDER — METFORMIN HCL 500 MG PO TABS: 500.0000 mg | ORAL_TABLET | Freq: Two times a day (BID) | ORAL | Status: DC

## 2015-07-23 MED ORDER — LIVING WELL WITH DIABETES BOOK
Freq: Once | Status: DC
  Filled 2015-07-23: qty 1

## 2015-07-23 MED ORDER — CARVEDILOL 6.25 MG PO TABS: 6.2500 mg | ORAL_TABLET | Freq: Two times a day (BID) | ORAL | Status: DC

## 2015-07-23 MED ORDER — FUROSEMIDE 40 MG PO TABS: 40.0000 mg | ORAL_TABLET | Freq: Every day | ORAL | Status: DC

## 2015-07-23 NOTE — Progress Notes (Signed)
Patient ID: Justin Bernard, male   DOB: 02-Jun-1961, 54 y.o.   MRN: [id number]    SUBJECTIVE: Doing well today.  Diuresed yesterday, much less lower leg edema.   Scheduled Meds: . carvedilol  6.25 mg Oral BID WC  . enoxaparin (LOVENOX) injection  60 mg Subcutaneous Q24H  . furosemide  40 mg Oral Daily  . insulin aspart  0-9 Units Subcutaneous TID WC  . magnesium chloride  1 tablet Oral BID  . midazolam  1 mg Intravenous Once  . potassium chloride  20 mEq Oral Daily  . sodium chloride  3 mL Intravenous Q12H  . spironolactone  25 mg Oral Daily   Continuous Infusions: . lactated ringers Stopped (07/20/15 2000)   PRN Meds:.acetaminophen **OR** acetaminophen, bisacodyl, HYDROcodone-acetaminophen, menthol-cetylpyridinium **OR** phenol, methocarbamol **OR** methocarbamol (ROBAXIN)  IV, metoCLOPramide **OR** metoCLOPramide (REGLAN) injection, morphine injection, ondansetron **OR** ondansetron (ZOFRAN) IV, polyethylene glycol    Filed Vitals:   07/22/15 1425 07/22/15 1743 07/22/15 2005 07/23/15 0358  BP: 100/57 110/57 103/66 105/70  Pulse: 91 108 101 98  Temp: 98.1 F (36.7 C)  99 F (37.2 C) 98.9 F (37.2 C)  TempSrc: Oral  Oral Oral  Resp: Height:      Weight:    282 lb 14.4 oz (128.323 kg)  SpO2: 96%  93% 96%    Intake/Output Summary (Last 24 hours) at 07/23/15 0906 Last data filed at 07/23/15 0855  Gross per 24 hour  Intake    846 ml  Output   3950 ml  Net  -3104 ml    LABS: Basic Metabolic Panel:  Recent Labs  95/62/13 0435 07/23/15 0538  NA 133* 136  K 4.1 3.5  CL 98* 100*  CO2 28 26  GLUCOSE 109* 114*  BUN 12 12  CREATININE 0.82 0.82  CALCIUM 8.8* 8.7*   Liver Function Tests:  Recent Labs  07/22/15 0435 07/23/15 0538  AST 88* 84*  ALT 49 47  ALKPHOS 101 98  BILITOT 1.3* 1.3*  PROT 5.3* 4.8*  ALBUMIN 2.5* 2.4*   No results for input(s): LIPASE, AMYLASE in the last 72 hours. CBC:  Recent Labs  07/22/15 0435 07/23/15 0538  WBC  7.0 6.2  HGB 11.0* 10.6*  HCT 34.3* 32.8*  MCV 104.3* 102.5*  PLT 249 227   Cardiac Enzymes: No results for input(s): CKTOTAL, CKMB, CKMBINDEX, TROPONINI in the last 72 hours. BNP: Invalid input(s): POCBNP D-Dimer: No results for input(s): DDIMER in the last 72 hours. Hemoglobin A1C: No results for input(s): HGBA1C in the last 72 hours. Fasting Lipid Panel: No results for input(s): CHOL, HDL, LDLCALC, TRIG, CHOLHDL, LDLDIRECT in the last 72 hours. Thyroid Function Tests: No results for input(s): TSH, T4TOTAL, T3FREE, THYROIDAB in the last 72 hours.  Invalid input(s): FREET3 Anemia Panel: No results for input(s): VITAMINB12, FOLATE, FERRITIN, TIBC, IRON, RETICCTPCT in the last 72 hours.  RADIOLOGY: Dg Chest 1 View  07/17/2015  CLINICAL DATA:  54 year old male with hypotension. Medical history of CHF and asthma. EXAM: CHEST 1 VIEW COMPARISON:  Chest radiograph dated 12/04/2005 FINDINGS: Single frontal view of the chest does not demonstrate any focal consolidation, pleural effusion, or pneumothorax. Left lung base clear linear plaque likely atelectatic changes noted. The cardiac silhouette is within normal limits. Left pectoral AICD device. The osseous structures are grossly unremarkable. IMPRESSION: No active disease. Electronically Signed   By: Elgie Collard M.D.   On: 07/17/2015 20:54   Dg Shoulder Right  07/17/2015  CLINICAL  DATA:  Right shoulder injury EXAM: RIGHT SHOULDER - 2+ VIEW COMPARISON:  None. FINDINGS: There is a comminuted right proximal humerus fracture involving the surgical neck, with mild apex lateral angulation, slight overriding of the fracture fragments and 1 shaft's with anterior displacement of the dominant distal fracture fragment. No dislocation at the right glenohumeral joint. 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 IMPRESSION: Comminuted, angulated and displaced proximal right humerus fracture involving the surgical neck. Electronically Signed    By: [name] M.D.   On: 07/17/2015 17:44   Dg Chest Port 1 View  07/19/2015  CLINICAL DATA:  Productive cough EXAM: PORTABLE CHEST 1 VIEW COMPARISON:  Chest x-rays dated 07/17/2015 and 12/04/2005. FINDINGS: Heart size is upper normal, stable. Overall cardiomediastinal silhouette remains normal in size and configuration. Left chest wall pacemaker/AICD is stable in position. Lungs are clear. No pleural effusion seen. No pneumothorax seen. Osseous structures about the chest are unremarkable. IMPRESSION: Stable chest x-ray. No evidence of acute cardiopulmonary abnormality. Lungs are clear. Electronically Signed   By: Bary Richard M.D.   On: 07/19/2015 08:14   Dg Shoulder Right Port  07/20/2015  CLINICAL DATA:  Postop ORIF right humerus fracture EXAM: PORTABLE RIGHT SHOULDER - 2+ VIEW COMPARISON:  None. FINDINGS: Compression plate and screw fixation of a mildly comminuted right proximal humeral fracture. Near anatomic alignment of the dominant fracture fragments. Visualized right lung is clear per IMPRESSION: Status post ORIF of a mildly comminuted right proximal humeral fracture. Electronically Signed   By: Charline Bills M.D.   On: 07/20/2015 19:12    PHYSICAL EXAM General: NAD Neck: No JVD, no thyromegaly or thyroid nodule.  Lungs: Clear to auscultation bilaterally with normal respiratory effort. CV: Nondisplaced PMI.  Heart regular S1/S2, no S3/S4, no murmur.  Trace ankle edema.  No carotid bruit.  Normal pedal pulses.  Abdomen: Soft, nontender, no hepatosplenomegaly, no distention.  Neurologic: Alert and oriented x 3.  Psych: Normal affect. Extremities: No clubbing or cyanosis.   TELEMETRY: Reviewed telemetry pt in [facility]  ASSESSMENT AND PLAN: 1. Right humeral fracture: Timeline here is difficult. It sounds like he may have fallen because of severe orthostasis. BP was running very low prior to admission. No definite syncope but cannot rule out.  2. Hypotension: Patient reports BP  was low for about 2 wks prior to admission. SBP got as low as 60s on home readings. He was lightheaded with standing. As above, arm fracture may have been related to orthostasis and fall. No definite trigger for fall in BP. No GI or other illness. No change in medications. He was on Coreg 25 mg bid and decreased to 12.5 mg bid on his own prior to admission. BP now stable on Coreg 3.125 mg bid. He is no longer lightheaded.  - Can increase Coreg to 6.25 mg bid.  3. 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 EF had risen over the years. It remains 50-55% on echo done here this admission. He developed volume overload post-op, weight up 8 lbs. He was diuresed with IV Lasix yesterday, volume looks much better today.   - Transition to Lasix 40 mg po daily today.  - Continue spironolactone 25 mg daily. - Coreg to 6.25 mg bid, will try to gradually titrate back up as outpatient.  - Unclear as  to why he was never on ACEI or ARB (does not remember taking either). Would not start now with EF near-normal and BP low.  - Will arrange followup for [facility].  4. Hyperglycemia: HgbA1c elevated, suspect new diagnosis of DM. Per [facility].   Marca Ancona 07/23/2015 9:12 AM

## 2015-07-23 NOTE — Discharge Summary (Addendum)
PATIENT DETAILS Name: Justin Bernard Age: 54 y.o. Sex: male Date of Birth: 1960/09/15 MRN: [id number]. Admitting Physician: Toy Baker, MD PCP:No PCP Per Patient  Admit Date: 07/17/2015 Discharge date: 07/23/2015  Recommendations for Outpatient Follow-up:  1. Ensure follow up with Orthopedics 2. Please repeat CBC/BMET at next visit 3. Newly diagnosed DM-started on Metformin  PRIMARY DISCHARGE DIAGNOSIS:  Active Problems:   Hypokalemia   Orthostatic hypotension   Chronic systolic CHF (congestive heart failure), NYHA class 2 (HCC)   Dehydration   Hyponatremia   Fracture closed, humerus   Fall   Shoulder fracture   Dyspnea      PAST MEDICAL HISTORY: Past Medical History  Diagnosis Date  . Chronic systolic heart failure (HCC)     NICM, previous heart transplant candidate with EF 10%  . Asthma     DISCHARGE MEDICATIONS: Current Discharge Medication List    START taking these medications   Details  glucose monitoring kit (FREESTYLE) monitoring kit 1 each by Does not apply route 4 (four) times daily - after meals and at bedtime. 1 month Diabetic Testing Supplies for QAC-QHS accuchecks. Qty: 1 each, Refills: 1    HYDROcodone-acetaminophen (NORCO) 5-325 MG tablet Take 1 tablet by mouth every 6 (six) hours as needed for moderate pain. Qty: 30 tablet, Refills: 0    metFORMIN (GLUCOPHAGE) 500 MG tablet Take 1 tablet (500 mg total) by mouth 2 (two) times daily with a meal. Qty: 60 tablet, Refills: 0    polyethylene glycol (MIRALAX / GLYCOLAX) packet Take 17 g by mouth daily as needed for mild constipation. Qty: 14 each, Refills: 0    potassium chloride SA (K-DUR,KLOR-CON) 20 MEQ tablet Take 1 tablet (20 mEq total) by mouth daily. Qty: 30 tablet, Refills: 0    spironolactone (ALDACTONE) 25 MG tablet Take 1 tablet (25 mg total) by mouth daily. Qty: 30 tablet, Refills: 0      CONTINUE these medications which have CHANGED   Details  carvedilol (COREG) 6.25 MG  tablet Take 1 tablet (6.25 mg total) by mouth 2 (two) times daily with a meal. Qty: 60 tablet, Refills: 0    furosemide (LASIX) 40 MG tablet Take 1 tablet (40 mg total) by mouth daily. Qty: 30 tablet, Refills: 0      CONTINUE these medications which have NOT CHANGED   Details  Probiotic Product (PROBIOTIC PO) Take 1 tablet by mouth daily.      STOP taking these medications     eplerenone (INSPRA) 25 MG tablet         ALLERGIES:   Allergies  Allergen Reactions  . Penicillins Other (See Comments)    Possible childhood reaction    BRIEF HPI:  See H&P, Labs, Consult and Test reports for all details in brief, patient is a 54 y.o. male with history of CHF (NICM) and asthma who presented tot he hospital for right shoulder pain following a ?fall. R Shoulder X-ray with commuted, angulated, and displaced proximal right humerus fracture involving surgical neck.He was found to have severe hypokalemia and hyponatremia on admission  CONSULTATIONS:   cardiology and orthopedic surgery  PERTINENT RADIOLOGIC STUDIES: Dg Chest 1 View  07/17/2015  CLINICAL DATA:  54 year old male with hypotension. Medical history of CHF and asthma. EXAM: CHEST 1 VIEW COMPARISON:  Chest radiograph dated 12/04/2005 FINDINGS: Single frontal view of the chest does not demonstrate any focal consolidation, pleural effusion, or pneumothorax. Left lung base clear linear plaque likely atelectatic changes noted. The cardiac silhouette is within  normal limits. Left pectoral AICD device. The osseous structures are grossly unremarkable. IMPRESSION: No active disease. Electronically Signed   By: Anner Crete M.D.   On: 07/17/2015 20:54   Dg Shoulder Right  07/17/2015  CLINICAL DATA:  Right shoulder injury EXAM: RIGHT SHOULDER - 2+ VIEW COMPARISON:  None. FINDINGS: There is a comminuted right proximal humerus fracture involving the surgical neck, with mild apex lateral angulation, slight overriding of the fracture fragments  and 1 shaft's with anterior displacement of the dominant distal fracture fragment. No dislocation at the right glenohumeral joint. 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 IMPRESSION: Comminuted, angulated and displaced proximal right humerus fracture involving the surgical neck. Electronically Signed   By: Ilona Sorrel M.D.   On: 07/17/2015 17:44   Dg Chest Port 1 View  07/19/2015  CLINICAL DATA:  Productive cough EXAM: PORTABLE CHEST 1 VIEW COMPARISON:  Chest x-rays dated 07/17/2015 and 12/04/2005. FINDINGS: Heart size is upper normal, stable. Overall cardiomediastinal silhouette remains normal in size and configuration. Left chest wall pacemaker/AICD is stable in position. Lungs are clear. No pleural effusion seen. No pneumothorax seen. Osseous structures about the chest are unremarkable. IMPRESSION: Stable chest x-ray. No evidence of acute cardiopulmonary abnormality. Lungs are clear. Electronically Signed   By: Franki Cabot M.D.   On: 07/19/2015 08:14   Dg Shoulder Right Port  07/20/2015  CLINICAL DATA:  Postop ORIF right humerus fracture EXAM: PORTABLE RIGHT SHOULDER - 2+ VIEW COMPARISON:  None. FINDINGS: Compression plate and screw fixation of a mildly comminuted right proximal humeral fracture. Near anatomic alignment of the dominant fracture fragments. Visualized right lung is clear per IMPRESSION: Status post ORIF of a mildly comminuted right proximal humeral fracture. Electronically Signed   By: Julian Hy M.D.   On: 07/20/2015 19:12     PERTINENT LAB RESULTS: CBC:  Recent Labs  07/22/15 0435 07/23/15 0538  WBC 7.0 6.2  HGB 11.0* 10.6*  HCT 34.3* 32.8*  PLT 249 227   CMET CMP     Component Value Date/Time   NA 136 07/23/2015 0538   K 3.5 07/23/2015 0538   CL 100* 07/23/2015 0538   CO2 26 07/23/2015 0538   GLUCOSE 114* 07/23/2015 0538   BUN 12 07/23/2015 0538   CREATININE 0.82 07/23/2015 0538   CALCIUM 8.7* 07/23/2015 0538   PROT 4.8* 07/23/2015  0538   ALBUMIN 2.4* 07/23/2015 0538   AST 84* 07/23/2015 0538   ALT 47 07/23/2015 0538   ALKPHOS 98 07/23/2015 0538   BILITOT 1.3* 07/23/2015 0538   GFRNONAA >60 07/23/2015 0538   GFRAA >60 07/23/2015 0538    GFR Estimated Creatinine Clearance: 154.1 mL/min (by C-G formula based on Cr of 0.82). No results for input(s): LIPASE, AMYLASE in the last 72 hours. No results for input(s): CKTOTAL, CKMB, CKMBINDEX, TROPONINI in the last 72 hours. Invalid input(s): POCBNP No results for input(s): DDIMER in the last 72 hours. No results for input(s): HGBA1C in the last 72 hours. No results for input(s): CHOL, HDL, LDLCALC, TRIG, CHOLHDL, LDLDIRECT in the last 72 hours. No results for input(s): TSH, T4TOTAL, T3FREE, THYROIDAB in the last 72 hours.  Invalid input(s): FREET3 No results for input(s): VITAMINB12, FOLATE, FERRITIN, TIBC, IRON, RETICCTPCT in the last 72 hours. Coags: No results for input(s): INR in the last 72 hours.  Invalid input(s): PT Microbiology: Recent Results (from the past 240 hour(s))  Urine culture     Status: None   Collection Time: 07/18/15  1:13 AM  Result Value Ref Range Status   Specimen Description URINE, CATHETERIZED  Final   Special Requests NONE  Final   Culture NO GROWTH 1 DAY  Final   Report Status 07/19/2015 FINAL  Final  MRSA PCR Screening     Status: None   Collection Time: 07/18/15  2:27 PM  Result Value Ref Range Status   MRSA by PCR NEGATIVE NEGATIVE Final    Comment:        The GeneXpert MRSA Assay (FDA approved for NASAL specimens only), is one component of a comprehensive MRSA colonization surveillance program. It is not intended to diagnose MRSA infection nor to guide or monitor treatment for MRSA infections.      BRIEF HOSPITAL COURSE:  Brief Narrative: Justin Bernard is a 54 y.o. male with history of CHF (NICM) and asthma who presented tot he hospital for right shoulder pain following a ?fall. R Shoulder X-ray with commuted,  angulated, and displaced proximal right humerus fracture involving surgical neck.He was found to have severe hypokalemia and hyponatremia on admission-these were felt likely secondary to dehydration from diuretics. Patient underwent ORIF on 12/5. His electrolytes were corrected with supplementation and gentle hydration. Hospital course has been complicated by development of mild decompensated CHF.   Hospital Course by Problem List: Dehydration with severe hypokalemia and severe hyponatremia: due to diuretics, resolved with IVF  Right humerus fracture with angulation and displacement:s/p ORIF by Dr. Veverly Fells on 12/5.Per Ortho ok for discharge, will likely require HHPT/OT. Patient aware that he is to [facility] on RUE. Ensure follow up with Dr Veverly Fells  Acute on Chronic Systolic CHF: Suspect from IVF,Diuretics were held on admit-briefly given IV Lasix-seen by Cards-transitioned to oral meds-ok for discharge today. Has appt with [facility].    Fall/?Syncope:suspect from dehydration/orthostatics  Newly diagnosed DM-2:CBG'S noted to be elevated-A1C elevated at 8.2-will plan on starting Metformin on discharge.   Transaminitis:resolving-?ETOH use-but denies. Recheck LFT's as outpatient.   TODAY-DAY OF DISCHARGE:  Subjective:   Justin Bernard today has no headache,no chest abdominal pain,no new weakness tingling or numbness, feels much better wants to go home today.   Objective:   Blood pressure 105/70, pulse 98, temperature 98.9 F (37.2 C), temperature source Oral, resp. rate 16, height $RemoveBe'6\' 4"'bOHDGgdfT$  (1.93 m), weight 128.323 kg (282 lb 14.4 oz), SpO2 96 %.  Intake/Output Summary (Last 24 hours) at 07/23/15 0939 Last data filed at 07/23/15 0855  Gross per 24 hour  Intake    846 ml  Output   3950 ml  Net  -3104 ml   Filed Weights   07/21/15 0548 07/22/15 0325 07/23/15 0358  Weight: 127.96 kg (282 lb 1.6 oz) 130.001 kg (286 lb 9.6 oz) 128.323 kg (282 lb 14.4 oz)    Exam Awake Alert, Oriented *3, No new  F.N deficits, Normal affect [place].AT,PERRAL Supple Neck,No JVD, No cervical lymphadenopathy appriciated.  Symmetrical Chest wall movement, Good air movement bilaterally, CTAB RRR,No Gallops,Rubs or new Murmurs, No Parasternal Heave +ve B.Sounds, Abd Soft, Non tender, No organomegaly appriciated, No rebound -guarding or rigidity. No Cyanosis, Clubbing or edema, No new Rash or bruise  DISCHARGE CONDITION: Stable  DISPOSITION: Home with home health services  DISCHARGE INSTRUCTIONS:    Activity:  As tolerated with Full fall precautions use walker/cane & assistance as needed  Get Medicines reviewed and adjusted: Please take all your medications with you for your next visit with your Primary MD  Please request your Primary MD to go over all hospital tests and procedure/radiological results  at the follow up, please ask your Primary MD to get all Hospital records sent to his/her office.  If you experience worsening of your admission symptoms, develop shortness of breath, life threatening emergency, suicidal or homicidal thoughts you must seek medical attention immediately by calling 911 or calling your MD immediately  if symptoms less severe.  You must read complete instructions/literature along with all the possible adverse reactions/side effects for all the Medicines you take and that have been prescribed to you. Take any new Medicines after you have completely understood and accpet all the possible adverse reactions/side effects.   Do not drive when taking Pain medications.   Do not take more than prescribed Pain, Sleep and Anxiety Medications  Special Instructions: If you have smoked or chewed Tobacco  in the last 2 yrs please stop smoking, stop any regular Alcohol  and or any Recreational drug use.  Wear Seat belts while driving.  Please note  You were cared for by a hospitalist during your hospital stay. Once you are discharged, your primary care physician will handle any further  medical issues. Please note that NO REFILLS for any discharge medications will be authorized once you are discharged, as it is imperative that you return to your primary care physician (or establish a relationship with a primary care physician if you do not have one) for your aftercare needs so that they can reassess your need for medications and monitor your lab values.   Diet recommendation: Diabetic Diet Heart Healthy diet  Discharge Instructions    (HEART FAILURE PATIENTS) Call MD:  Anytime you have any of the following symptoms: 1) 3 pound weight gain in 24 hours or 5 pounds in 1 week 2) shortness of breath, with or without a dry hacking cough 3) swelling in the hands, feet or stomach 4) if you have to sleep on extra pillows at night in order to breathe.    Complete by:  As directed      Ambulatory referral to Occupational Therapy    Complete by:  As directed   Right arm     Diet - low sodium heart healthy    Complete by:  As directed      Diet Carb Modified    Complete by:  As directed      Increase activity slowly    Complete by:  As directed   Strict Non Weight Bearing on the right UE           Follow-up Information    Follow up with NORRIS,STEVEN R, MD. Call in 1 week.   Specialty:  Orthopedic Surgery   Why:  [phone]  next week   Contact information:   [address] [place] [address] [phone]       Follow up with Loralie Champagne, MD On 07/29/2015.   Specialty:  Cardiology   Why:  at 1030 for post hospital follow up. Please bring all of your medications with you to your visit. The code for patient parking is 0009.   Contact information:   [address] [place] [address] [phone]       Schedule an appointment as soon as possible for a visit today to follow up.   Why:  Hospital follow up   Contact information:   Primary MD      Total Time spent on discharge equals  45  minutes.  SignedOren Binet 07/23/2015 9:39 AM

## 2015-07-23 NOTE — Progress Notes (Addendum)
Occupational Therapy Treatment Patient Details Name: Justin Bernard MRN: [id number] DOB: 1961/02/12 Today's Date: 07/23/2015    History of present illness Pt is a 54 y/o M w/ Rt proximal humeral fx, pt denies any falls but states he pulled w/ Rt arm and felt a pop.  Pt's PMH includes CHF and back surgery.   OT comments  Pt progressing towards acute OT goals. Focus of session was RUE exercises, sling management, ADL education, and reinforcing NWB status. Session details below. Pt still with impaired balance, updating d/c recommendation to HHOT. OT to continue to follow acutely.  Follow Up Recommendations  Home health OT;Supervision - Intermittent;Other (comment) (OOB/mobility)    Equipment Recommendations  3 in 1 bedside comode    Recommendations for Other Services      Precautions / Restrictions Precautions Precautions: Shoulder Type of Shoulder Precautions: conservative protocol Shoulder Interventions: Shoulder sling/immobilizer;Off for dressing/bathing/exercises Precaution Booklet Issued: Yes (comment) Precaution Comments: reviewed NWB status w/ pt Required Braces or Orthoses: Sling Restrictions Weight Bearing Restrictions: Yes RUE Weight Bearing: Non weight bearing       Mobility Bed Mobility Overal bed mobility: Needs Assistance Bed Mobility: Rolling;Sidelying to Sit;Sit to Sidelying Rolling: Min guard Sidelying to sit: Min assist     Sit to sidelying: Min guard General bed mobility comments: On left side. Discussed position of bed at home. Light min A for maintaining position of RUE. Heavy utilization of bed rails. HOB flat. Discussed how to instruct family to assist at home.   Transfers Overall transfer level: Needs assistance Equipment used: None Transfers: Sit to/from Stand Sit to Stand: Min guard         General transfer comment: from EOB 1x. side stepped to left with external support of bed rail.     Balance Overall balance assessment: Needs  assistance;History of Falls Sitting-balance support: Single extremity supported;Feet supported Sitting balance-Leahy Scale: Fair Sitting balance - Comments: Pt fatigues in sitting position. Cues to not WB on RUE.    Standing balance support: Single extremity supported;During functional activity Standing balance-Leahy Scale: Poor Standing balance comment: seeking external support for static stand                   ADL Overall ADL's : Needs assistance/impaired                                       General ADL Comments: Reviewed ADL education including AE for ADLs. Pt with cueing needed for NWB status in sitting position. Reviewed sling education, cues needed for positioning.      Vision                     Perception     Praxis      Cognition   Behavior During Therapy: [facility] for tasks assessed/performed Overall Cognitive Status: No family/caregiver present to determine baseline cognitive functioning Area of Impairment: Problem solving     Memory: Decreased recall of precautions        Problem Solving: Slow processing;Difficulty sequencing;Requires verbal cues General Comments: Pt requires step by step cues for sequencing on steps      Exercises Other Exercises Other Exercises: Rt elbow, wrist, and digit AROM in supine position, 10 reps.  Other Exercises: lap slides RUE in seated position. moderate cues for technique to avoid AROM, 10 reps.  Other Exercises: Attempted supported pendulums with pt reporting increased tightness/heaviness in  rt shoulder so advised pt to hold off on supported pedulums for now.    Shoulder Instructions       General Comments      Pertinent Vitals/ Pain       Pain Assessment: 0-10 Pain Score: 4  Pain Location: Rt shoulder Pain Descriptors / Indicators: Tightness;Sore Pain Intervention(s): Limited activity within patient's tolerance;Monitored during session;Repositioned;Ice applied  Home Living      Prior  Functioning/Environment Level of Independence: Independent           Frequency Min 3X/week     Progress Toward Goals  OT Goals(current goals can now be found in the care plan section)  Progress towards OT goals: Progressing toward goals  Acute Rehab OT Goals Patient Stated Goal: to go home once able OT Goal Formulation: With patient Time For Goal Achievement: 07/28/15 Potential to Achieve Goals: Good ADL Goals Pt Will Perform Grooming: with modified independence;sitting;standing Pt Will Perform Upper Body Bathing: with modified independence;sitting Pt Will Perform Lower Body Bathing: with modified independence;sit to/from stand;sitting/lateral leans;with adaptive equipment Pt Will Perform Upper Body Dressing: with modified independence;sitting Pt Will Perform Lower Body Dressing: with modified independence;sitting/lateral leans;sit to/from stand;with adaptive equipment Pt Will Transfer to Toilet: with modified independence;ambulating Pt Will Perform Toileting - Clothing Manipulation and hygiene: with modified independence;sitting/lateral leans;sit to/from stand Pt Will Perform Tub/Shower Transfer: Shower transfer;with modified independence;ambulating;3 in 1 Pt/caregiver will Perform Home Exercise Program: With written HEP provided;Independently  Plan Discharge plan needs to be updated    Co-evaluation                 End of Session Equipment Utilized During Treatment: Other (comment) (sling)   Activity Tolerance Patient tolerated treatment well   Patient Left in bed;with call bell/phone within reach   Nurse Communication          Time: 1246-1311 OT Time Calculation (min): 25 min  Charges: OT General Charges $OT Visit: 1 Procedure OT Treatments $Self Care/Home Management : 8-22 mins $Therapeutic Exercise: 8-22 mins  Pilar Grammes 07/23/2015, 1:22 PM

## 2015-07-23 NOTE — Progress Notes (Signed)
Inpatient Diabetes Program Recommendations  AACE/ADA: New Consensus Statement on Inpatient Glycemic Control (2015)  Target Ranges:  Prepandial:   less than 140 mg/dL      Peak postprandial:   less than 180 mg/dL (1-2 hours)      Critically ill patients:  140 - 180 mg/dL   Spoke with pt about new diabetes diagnosis. Discussed A1C results (8.2%) and explained what an A1C is, basic pathophysiology of DM Type 2, basic home care, importance of checking CBGs and maintaining good CBG control to prevent long-term and short-term complications. Patient states that he was prediabetic in 2003 and his prim MD sent him to a "fat camp" to learn about diet modifications. Several members of his family have DM 2. Reviewed signs and symptoms of hyperglycemia and hypoglycemia along with treatment for both. Discussed possible new DM medication on discharge and side effects. Fleet Contras, NT in room with patient taught patient how to check glucose while I was in the room. Patient to receive prescription for glucose meter at time of discharge per Dr. Jerral Ralph. Patient provided with the Living Well with Diabetes book and Diabetes meal planning guide. RNs to provide ongoing basic DM education at bedside with this patient and engage patient to actively check blood glucose. Have ordered educational booklet and DM videos.  Thanks,  Christena Deem RN, MSN, [facility] Inpatient Diabetes Coordinator Team Pager [phone] (8a-5p)

## 2015-07-23 NOTE — Evaluation (Signed)
Physical Therapy Evaluation Patient Details Name: Justin Bernard MRN: [id number] DOB: 10-29-1960 Today's Date: 07/23/2015   History of Present Illness  Pt is a 54 y/o M w/ Rt proximal humeral fx, pt denies any falls but states he pulled w/ Rt arm and felt a pop.  Pt's PMH includes CHF and back surgery.  Clinical Impression  Patient is s/p above surgery resulting in functional limitations due to the deficits listed below (see PT Problem List). Justin Bernard requires step by step sequencing for stair training but remains extremely anxious and ultimately pt unable to complete stair training.  Pt demonstrates 5/5 strength grossly in Bil LEs but reports weakness in Bil LEs w/ stair training to the point that he beings saying, "I am going to fall, my legs are weak". Pt will benefit from [facility] as he has +3 assist available at home to bump him up the steps. Patient will benefit from skilled PT to increase their independence and safety with mobility to allow discharge to the venue listed below.      Follow Up Recommendations Home health PT;Supervision for mobility/OOB    Equipment Recommendations  Wheelchair (measurements PT);Wheelchair cushion (measurements PT);Cane    Recommendations for Other Services       Precautions / Restrictions Precautions Precautions: Shoulder Type of Shoulder Precautions: conservative protocol Shoulder Interventions: Shoulder sling/immobilizer;Off for dressing/bathing/exercises Precaution Booklet Issued: No Precaution Comments: reviewed NWB status w/ pt Required Braces or Orthoses: Sling Restrictions Weight Bearing Restrictions: Yes RUE Weight Bearing: Non weight bearing      Mobility  Bed Mobility Overal bed mobility: Needs Assistance Bed Mobility: Rolling;Sidelying to Sit Rolling: Min guard Sidelying to sit: Min assist       General bed mobility comments: Required step by step cues and reasoning for getting OOB on Lt side.  Increased  time.  Transfers Overall transfer level: Needs assistance Equipment used: None Transfers: Sit to/from Stand Sit to Stand: Min guard         General transfer comment: Cues for hand placement, pt otherwise demosntrates proper technique.  Is able to perform sit<>stand from bed x1 and low recliner chair x3  Ambulation/Gait Ambulation/Gait assistance: Min guard;+2 safety/equipment Ambulation Distance (Feet): 30 Feet Assistive device: Straight cane Gait Pattern/deviations: Step-through pattern;Decreased stride length;Antalgic;Trunk flexed   Gait velocity interpretation: Below normal speed for age/gender General Gait Details: Dec gait speed w/ very mild instability noted.  Chair follow for pt's safety.  Pt uses cane w/ safe technique.    Stairs Stairs: Yes Stairs assistance: +2 safety/equipment Stair Management: No rails;Forwards;Step to pattern Number of Stairs: 1 General stair comments: 1 person HHA provided as this is what pt will have available at home.  Pt is extremely anxious about stair training and reports, "I won't be able to do this" prior to attempting but is unable to provide reason.  Reports he was completing stair training w/ supervision at home PTA.  Required mod A to step up onto first step and then begins saying, "my legs are weak, I'm going to fall, I need to sit down".  Pt stepped down and returned to recliner chair.    Wheelchair Mobility    Modified Rankin (Stroke Patients Only)       Balance Overall balance assessment: Needs assistance Sitting-balance support: Single extremity supported;Feet supported Sitting balance-Leahy Scale: Fair     Standing balance support: During functional activity;Single extremity supported Standing balance-Leahy Scale: Poor Standing balance comment: Requires cane and A for stability  Pertinent Vitals/Pain Pain Assessment: No/denies pain Pain Intervention(s): Limited activity within  patient's tolerance;Monitored during session    Home Living Family/patient expects to be discharged to:: Private residence Living Arrangements: Children;Other (Comment) (67 yo son) Available Help at Discharge: Family;Available PRN/intermittently Type of Home: House Home Access: Stairs to enter Entrance Stairs-Rails: None Entrance Stairs-Number of Steps: 3+1 Home Layout: One level Home Equipment: None Additional Comments: Pt reports he usually holds onto a the window ledge to his Lt going up steps into home; however nothing to hold onto for last step up into house.  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    Prior Function Level of Independence: Independent         Comments: Pt reports he was only ambulating short distances over the past few weeks due to anxiety about falling as his legs are weak.  He reports his legs feel weak because of his low potassium     Hand Dominance   Dominant Hand:  ("a little of both")    Extremity/Trunk Assessment   Upper Extremity Assessment: Defer to OT evaluation           Lower Extremity Assessment: LLE deficits/detail;RLE deficits/detail RLE Deficits / Details: grossly 5/5 strength; however, pt reports that he feel weak when ambulating and participating in stair training, this appears to be due to increased anxiety LLE Deficits / Details: grossly 5/5 strength; however, pt reports that he feel weak when ambulating and participating in stair training, this appears to be due to increased anxiety     Communication   Communication: No difficulties  Cognition Arousal/Alertness: Awake/alert Behavior During Therapy: Anxious Overall Cognitive Status: No family/caregiver present to determine baseline cognitive functioning Area of Impairment: Problem solving             Problem Solving: Slow  processing;Difficulty sequencing;Requires verbal cues General Comments: Pt requires step by step cues for sequencing on steps    General Comments General comments (skin integrity, edema, etc.): Discussed w/ CM the potential for pt to have WC upon D/C to assist w/ getting up stairs into home as he has +3 A available.  CM to follow up w/ PT.    Exercises        Assessment/Plan    PT Assessment Patient needs continued PT services  PT Diagnosis Difficulty walking;Generalized weakness;Acute pain   PT Problem List Decreased strength;Decreased range of motion;Decreased activity tolerance;Decreased balance;Decreased mobility;Decreased cognition;Decreased knowledge of use of DME;Decreased safety awareness;Decreased knowledge of precautions;Pain  PT Treatment Interventions DME instruction;Gait training;Stair training;Functional mobility training;Therapeutic activities;Therapeutic exercise;Balance training;Neuromuscular re-education;Cognitive remediation;Patient/family education;Wheelchair mobility training   PT Goals (Current goals can be found in the Care Plan section) Acute Rehab PT Goals Patient Stated Goal: to go home once able PT Goal Formulation: With patient Time For Goal Achievement: 08/01/15 Potential to Achieve Goals: Good    Frequency Min 3X/week   Barriers to discharge Inaccessible home environment steps to enter home    Co-evaluation               End of Session Equipment Utilized During Treatment: Gait belt Activity Tolerance: Patient limited by fatigue;Other (comment);Treatment limited secondary to agitation (limited by anxiety ) Patient left: in chair;with call bell/phone within reach Nurse Communication: Mobility status;Other (comment) (d/c plan)         Time: 1005-1031 PT  Time Calculation (min) (ACUTE ONLY): 26 min   Charges:   PT Evaluation $Initial PT Evaluation Tier I: 1 Procedure PT Treatments $Gait Training: 8-22 mins   PT G CodesMichail Jewels PT, DPT [phone] Pager: [phone] 07/23/2015, 11:42 AM

## 2015-07-23 NOTE — Care Management Note (Signed)
Case Management Note  Patient Details  Name: Justin Bernard MRN: [id number] Date of Birth: 1960-12-16  Subjective/Objective:       Justin Bernard is a 54 y.o. male with history of CHF (NICM) and asthma who presented tot he hospital for right shoulder pain following a ?fall. R Shoulder X-ray with commuted, angulated, and displaced proximal right humerus fracture involving surgical neck.He was found to have severe hypokalemia and hyponatremia on admission-these were felt likely secondary to dehydration from diuretics. Patient underwent ORIF on 12/5. His electrolytes were corrected with supplementation and gentle hydration. Hospital course has been complicated by development of mild decompensated CHF.               Action/Plan: Plan for d/c home 07-23-15. CM did speak with pt in regards to disposition needs. Pt chose [facility] for [facility] Services /DME. DME to be dropped off by the room before pt is d/c. Referral made for services and SOC to begin within 24-48 hrs post d/c. No further needs from CM at this time.   Expected Discharge Date:                  Expected Discharge Plan:  Home w Home Health Services  In-House Referral:  NA  Discharge planning Services  CM Consult  Post Acute Care Choice:  Home Health, Durable Medical Equipment Choice offered to:  Patient  DME Arranged:  3-N-1, Wheelchair manual DME Agency:  [facility]  HH Arranged:  PT, OT [facility] Agency:  [facility]  Status of Service:  Completed, signed off  Medicare Important Message Given:    Date Medicare IM Given:    Medicare IM give by:    Date Additional Medicare IM Given:    Additional Medicare Important Message give by:     If discussed at Long Length of Stay Meetings, dates discussed:    Additional Comments:  Gala Lewandowsky, RN 07/23/2015, 12:36 PM

## 2015-07-29 ENCOUNTER — Encounter (HOSPITAL_COMMUNITY): Payer: Self-pay

## 2015-07-29 ENCOUNTER — Ambulatory Visit (HOSPITAL_COMMUNITY)
Admit: 2015-07-29 | Discharge: 2015-07-29 | Disposition: A | Discharge status: Home / Self Care | Payer: Managed Care, Other (non HMO) | Source: Ambulatory Visit | Attending: Cardiology | Admitting: Cardiology

## 2015-07-29 VITALS — BP 106/58 | HR 91 | Wt 276.5 lb

## 2015-07-29 DIAGNOSIS — I5032 Chronic diastolic (congestive) heart failure: Secondary | ICD-10-CM | POA: Diagnosis not present

## 2015-07-29 DIAGNOSIS — I951 Orthostatic hypotension: Secondary | ICD-10-CM | POA: Insufficient documentation

## 2015-07-29 DIAGNOSIS — I5022 Chronic systolic (congestive) heart failure: Secondary | ICD-10-CM | POA: Diagnosis not present

## 2015-07-29 MED ORDER — FUROSEMIDE 40 MG PO TABS: ORAL_TABLET | ORAL | Status: DC

## 2015-07-29 MED ORDER — POTASSIUM CHLORIDE CRYS ER 20 MEQ PO TBCR: 20.0000 meq | EXTENDED_RELEASE_TABLET | Freq: Every day | ORAL | Status: DC

## 2015-07-29 MED ORDER — CARVEDILOL 3.125 MG PO TABS: 3.1250 mg | ORAL_TABLET | Freq: Two times a day (BID) | ORAL | Status: DC

## 2015-07-29 NOTE — Patient Instructions (Signed)
DECREASE Carvedilol (Coreg) to 3.125 mg twice daily.  INCREASE Lasix to 40 mg (1 tab) twice daily for THREE DAYS...then to 40mg  (1 tab) in am and 20 mg (1/2 tab) in pm.  INCREASE Potassium to 20 meq (1 tab) in am and 10 meq (1/2 tab) in pm.  Wear compression stockings on both legs as much as tolerated. Remove to bathe. [facility] Address: [address], [place], [place] [address]  Phone: [phone]  Hours:  9AM-5:30PM  Follow up 3 weeks with Shirlee Latch and lab work.  Do the following things EVERYDAY: 1) Weigh yourself in the morning before breakfast. Write it down and keep it in a log. 2) Take your medicines as prescribed 3) Eat low salt foods-Limit salt (sodium) to 2000 mg per day.  4) Stay as active as you can everyday 5) Limit all fluids for the day to less than 2 liters

## 2015-07-29 NOTE — Progress Notes (Signed)
Patient ID: Justin Bernard, male   DOB: Dec 21, 1960, 54 y.o.   MRN: [id number]  Cardiology: Dr. Shirlee Latch  54 yo with history of nonischemic cardiomyopathy and recent fall with humeral fracture presents for cardiology clinic followup.    CHF was first noted in 07/2010 per patient when he developed dyspnea. He was seen at [facility]. Of note, mother had CHF diagnosis around 50 and grandfather had CHF diagnosis around 23. Pt's disease thought to be NICM with clean cath as below. 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  Initially, LVAD and transplant were considered, but EF improved greatly on medical management as echoes below demonstrate. He is s/p Environmental manager ICD implant 08/2010  He began to feel lightheaded with standing just prior to Thanksgiving. BPs were low at home over the same time period so he did cut his Coreg in half to 12.5 mg BID.  Despite this, he remained lightheaded with standing and fell, possibly with orthostatic syncope, fracturing his right humerus.  This was surgically repaired in 12/16.  Post-op, he developed volume overload and had to be diuresed. He was sent home on Coreg 6.25 mg bid, spironolactone 25 mg daily, and Lasix 40 mg daily. Echo this admission showed EF 50-55% .   He returns for post-hospital followup. Main complaint is a tremor that he has had since leaving the hospital.  He is not getting significantly lightheaded, but his physical therapist has been telling him that his BP drops a lot with standing. No dyspnea walking around his house.  He has swelling in his ankles and is using less Lasix than before his hospitalization.  No orthopnea/PND.   Labs (12/16): K 3.5, creatinine 0.82, HCT 0.82  ECG: NSR, nonspecific T wave flattening  SH: He lives with his 2 nephews and son in [place].  He teaches at a university.   FH: Mother with CHF around 35, grandfather with CHF  around 26.   ROS: All systems reviewed and negative except as per HPI.   PMH:  1. Chronic systolic CHF:  Nonischemic cardiomyopathy, initially followed at [facility]. [employer] ICD.  - Cardiolite 08/13/10 [facility], [place]: areas or reversibility in anterior wall and lesser extent inferior wall. Dilated LV with EF 15% and severe hypokinesis. - LHC 08/31/10 [facility], [place]: Right dominant. Left main free of disease, branches into LAD and cicrumflex. Large caliber vessels with no significant stenosis. No intimal irregularities in RCA. Elevated LVEDP at 40 - ECHO 08/12/2010 [facility], [place]: EF <24% - ECHO 09/27/2010 [place], [place]: EF 15% with moderate RVE with marked dysfunction, moderate MR/TR, trace AR, mild PR, moderate LV dilation, moderate RV dilation, Severe LAE, mild RAE.  - ECHO 01/08/2012 [place]: EF <25%, Grade 1 DD, MR/TR significantly improved. - ECHO 09/21/2011 [place]: EF 45-55%, Grade 1 DD, RV mildly dilated and reduced, Trace MR/PR/TR, no AR. - ECHO 03/21/2012 [place]: EF 45%, Grade 1 DD, Trace MR,PR,TR.  - ECHO 12/16 EF 50-55%, mild LVH.  2. Type II diabetes 3. Right humeral fracture (12/16).  4. Orthostatic hypotension.   Current Outpatient Prescriptions  Medication Sig Dispense Refill  . carvedilol (COREG) 3.125 MG tablet Take 1 tablet (3.125 mg total) by mouth 2 (two) times daily with a meal. 60 tablet 6  . furosemide (LASIX) 40 MG tablet Take 1 tab (40 mg) in am and 1/2 tab (20 mg) in pm 45 tablet 6  . glucose monitoring kit (FREESTYLE) monitoring  kit 1 each by Does not apply route 4 (four) times daily - after meals and at bedtime. 1 month Diabetic Testing Supplies for QAC-QHS accuchecks. 1 each 1  . HYDROcodone-acetaminophen (NORCO) 5-325 MG tablet Take 1 tablet by mouth every 6 (six) hours as needed for moderate pain. 30 tablet 0  . metFORMIN (GLUCOPHAGE) 500 MG tablet Take 1 tablet (500 mg total) by mouth 2 (two) times daily with a meal. 60 tablet 0  .  polyethylene glycol (MIRALAX / GLYCOLAX) packet Take 17 g by mouth daily as needed for mild constipation. 14 each 0  . potassium chloride SA (K-DUR,KLOR-CON) 20 MEQ tablet Take 1 tablet (20 mEq total) by mouth daily. Take 1 tab (20 meq) in am and 1/2 tab (10 meq) in pm 45 tablet 6  . Probiotic Product (PROBIOTIC PO) Take 1 tablet by mouth daily.    Marland Kitchen spironolactone (ALDACTONE) 25 MG tablet Take 1 tablet (25 mg total) by mouth daily. 30 tablet 0   No current facility-administered medications for this encounter.   BP 106/58 mmHg  Pulse 91  Wt 276 lb 8 oz (125.42 kg)  SpO2 96% General: NAD Neck: JVP 7-8 cm, no thyromegaly or thyroid nodule.  Lungs: Clear to auscultation bilaterally with normal respiratory effort. CV: Nondisplaced PMI.  Heart regular S1/S2, no S3/S4, no murmur.  1+ ankle edema.  No carotid bruit.  Normal pedal pulses.  Abdomen: Soft, nontender, no hepatosplenomegaly, no distention.  Skin: Intact without lesions or rashes.  Neurologic: Alert and oriented x 3.  Psych: Normal affect. Extremities: No clubbing or cyanosis.  HEENT: Normal.   Assessment/Plan: 1. Chronic diastolic CHF: Low EF in past with nonischemic cardiomyopathy.  EF now improved but he still has a tendency to retain fluid.  On exam today, I think that he is mildly volume overloaded.  I had dropped back on his Lasix after last hospitalization.  NYHA class II-III symptoms.  - Increase Lasix to 40 mg bid x 3 days then 40 qam/20 qpm.  Increase KCl to 20 qam/10 qpm.   - Given orthostasis, decrease Coreg to 3.125 mg bid.  - Check BMET/BNP in 10 days.  - I will arrange for EP followup for his [place].  2. Orthostatic hypotension: Large fall in BP with standing identified by physical therapist but he is not particularly symptomatic.   - Decreasing Coreg as above.  - Wear graded compression stockings during the day.  - Continue to increase activity level.   Loralie Champagne 07/30/2015

## 2015-07-31 ENCOUNTER — Telehealth (HOSPITAL_COMMUNITY): Payer: Self-pay | Admitting: *Deleted

## 2015-07-31 DIAGNOSIS — I5022 Chronic systolic (congestive) heart failure: Secondary | ICD-10-CM

## 2015-07-31 NOTE — Telephone Encounter (Signed)
Pt called to inquire about being enrolled in the program w/[employer].  Per last OV note pt was suppose to be referred to EP but looks like it was never done.  Referral placed pt aware they will call him to schedule

## 2015-08-04 ENCOUNTER — Encounter: Payer: Self-pay | Admitting: Internal Medicine

## 2015-08-04 ENCOUNTER — Ambulatory Visit (INDEPENDENT_AMBULATORY_CARE_PROVIDER_SITE_OTHER): Payer: Managed Care, Other (non HMO) | Admitting: Internal Medicine

## 2015-08-04 VITALS — BP 118/70 | HR 110 | Ht 76.0 in | Wt 265.2 lb

## 2015-08-04 DIAGNOSIS — I5022 Chronic systolic (congestive) heart failure: Secondary | ICD-10-CM

## 2015-08-04 DIAGNOSIS — Z95810 Presence of automatic (implantable) cardiac defibrillator: Secondary | ICD-10-CM

## 2015-08-04 LAB — CUP PACEART INCLINIC DEVICE CHECK
Date Time Interrogation Session: 20161220050000
HIGH POWER IMPEDANCE MEASURED VALUE: 57 Ohm
Lead Channel Impedance Value: 496 Ohm
Lead Channel Sensing Intrinsic Amplitude: 8.4 mV
Lead Channel Setting Pacing Amplitude: 2.5 V
Lead Channel Setting Pacing Pulse Width: 0.5 ms
MDC IDC LEAD IMPLANT DT: 20120222
MDC IDC LEAD LOCATION: 753860
MDC IDC LEAD MODEL: 296
MDC IDC LEAD SERIAL: 100951
MDC IDC MSMT LEADCHNL RV PACING THRESHOLD AMPLITUDE: 1.2 V
MDC IDC MSMT LEADCHNL RV PACING THRESHOLD PULSEWIDTH: 0.5 ms
MDC IDC SET LEADCHNL RV SENSING SENSITIVITY: 0.6 mV
MDC IDC STAT BRADY RV PERCENT PACED: 1 % — AB
Pulse Gen Serial Number: 100238

## 2015-08-04 NOTE — Progress Notes (Signed)
HPI Justin Bernard is referred today for ongoing evaluation and management of his ICD. He is a pleasant middle aged man with a non-ischemic CM, who underwent ICD insertion in [place]. He moves back and forth between [place] and [place] and plans to live here and visit there. He was hospitalized several weeks ago after passing out due to orthostasis. He injured his elbow. He underwent sugical repair and had his dose of beta blocker reduced. He feels well. He denies chest pain or sob. No edema. He has never had an ICD shock. His EF by echo has nearly normalized going from 10% remotely (? ETOH related) to 45%.  Allergies  Allergen Reactions  . Penicillins Other (See Comments)    Possible childhood reaction     Current Outpatient Prescriptions  Medication Sig Dispense Refill  . carvedilol (COREG) 3.125 MG tablet Take 1 tablet (3.125 mg total) by mouth 2 (two) times daily with a meal. 60 tablet 6  . furosemide (LASIX) 40 MG tablet Take 1 tab (40 mg) in am and 1/2 tab (20 mg) in pm 45 tablet 6  . glucose monitoring kit (FREESTYLE) monitoring kit 1 each by Does not apply route 4 (four) times daily - after meals and at bedtime. 1 month Diabetic Testing Supplies for QAC-QHS accuchecks. 1 each 1  . metFORMIN (GLUCOPHAGE) 500 MG tablet Take 1 tablet (500 mg total) by mouth 2 (two) times daily with a meal. 60 tablet 0  . polyethylene glycol (MIRALAX / GLYCOLAX) packet Take 17 g by mouth daily as needed for mild constipation. 14 each 0  . potassium chloride SA (K-DUR,KLOR-CON) 20 MEQ tablet Take 1 tablet (20 mEq total) by mouth daily. Take 1 tab (20 meq) in am and 1/2 tab (10 meq) in pm (Patient taking differently: Take 10 mEq by mouth 2 (two) times daily. ) 45 tablet 6  . Probiotic Product (PROBIOTIC PO) Take 1 tablet by mouth daily.    Marland Kitchen spironolactone (ALDACTONE) 25 MG tablet Take 1 tablet (25 mg total) by mouth daily. 30 tablet 0   No current facility-administered medications for this visit.     Past Medical  History  Diagnosis Date  . Chronic systolic heart failure (HCC)     NICM, previous heart transplant candidate with EF 10%  . Asthma     ROS:   All systems reviewed and negative except as noted in the HPI.   Past Surgical History  Procedure Laterality Date  . Back surgery    . Icd    . Orif humerus fracture Right 07/20/2015    Procedure: OPEN REDUCTION INTERNAL FIXATION (ORIF) PROXIMAL HUMERUS FRACTURE;  Surgeon: Netta Cedars, MD;  Location: [place];  Service: Orthopedics;  Laterality: Right;     Family History  Problem Relation Age of Onset  . Hypertension Other   . CAD Other   . Heart failure Mother   . Hypertension Mother   . CAD Father      Social History   Social History  . Marital Status: Married    Spouse Name: N/A  . Number of Children: N/A  . Years of Education: N/A   Occupational History  . Not on file.   Social History Main Topics  . Smoking status: Never Smoker   . Smokeless tobacco: Never Used  . Alcohol Use: No  . Drug Use: No  . Sexual Activity: Not on file   Other Topics Concern  . Not on file   Social History  Narrative     BP 118/70 mmHg  Pulse 110  Ht 6' 4" (1.93 m)  Wt 265 lb 3.2 oz (120.294 kg)  BMI 32.29 kg/m2  SpO2 96%  Physical Exam:  Well appearing 54 yo man, NAD HEENT: Unremarkable Neck:  6 cm JVD, no thyromegally Lymphatics:  No adenopathy Back:  No CVA tenderness Lungs:  Clear with no wheezes HEART:  Regular rate rhythm, no murmurs, no rubs, no clicks Abd:  soft, positive bowel sounds, no organomegally, no rebound, no guarding Ext:  2 plus pulses, no edema, no cyanosis, no clubbing Skin:  No rashes no nodules Neuro:  CN II through XII intact, motor grossly intact  EKG - reviewed - NSR  DEVICE  Normal device function.  See PaceArt for details.   Assess/Plan:

## 2015-08-04 NOTE — Assessment & Plan Note (Signed)
HIs DDD [employer] ICD is working normally. Will recheck in several months.

## 2015-08-04 NOTE — Assessment & Plan Note (Signed)
His symptoms are class 2. He will continue his current meds. Apparently he was not a candidate for afterload reduction or been unable to tolerate these meds.

## 2015-08-04 NOTE — Patient Instructions (Signed)
Medication Instructions:  Your physician recommends that you continue on your current medications as directed. Please refer to the Current Medication list given to you today.   Labwork: None ordered   Testing/Procedures: None ordered   Follow-Up: Your physician wants you to follow-up in: 12 months with Dr Taylor You will receive a reminder letter in the mail two months in advance. If you don't receive a letter, please call our office to schedule the follow-up appointment.   Remote monitoring is used to monitor your  ICD from home. This monitoring reduces the number of office visits required to check your device to one time per year. It allows us to keep an eye on the functioning of your device to ensure it is working properly. You are scheduled for a device check from home on 11/03/15. You may send your transmission at any time that day. If you have a wireless device, the transmission will be sent automatically. After your physician reviews your transmission, you will receive a postcard with your next transmission date.    Any Other Special Instructions Will Be Listed Below (If Applicable).     If you need a refill on your cardiac medications before your next appointment, please call your pharmacy.   

## 2015-08-06 ENCOUNTER — Encounter: Payer: Self-pay | Admitting: Internal Medicine

## 2015-08-14 ENCOUNTER — Other Ambulatory Visit (HOSPITAL_COMMUNITY): Payer: Self-pay | Admitting: *Deleted

## 2015-08-14 MED ORDER — SPIRONOLACTONE 25 MG PO TABS: 25.0000 mg | ORAL_TABLET | Freq: Every day | ORAL | Status: DC

## 2015-08-14 MED ORDER — POTASSIUM CHLORIDE CRYS ER 20 MEQ PO TBCR: EXTENDED_RELEASE_TABLET | ORAL | Status: DC

## 2015-08-14 NOTE — Telephone Encounter (Signed)
Pt d/c from hospital needed a refill on spironolactone.  Pt has seen Dr Shirlee Latch since and he wants him to continue it.  Sent in refill to pharmacy.

## 2015-08-24 ENCOUNTER — Encounter (HOSPITAL_COMMUNITY): Payer: Self-pay

## 2015-08-24 ENCOUNTER — Ambulatory Visit (HOSPITAL_COMMUNITY)
Admission: RE | Admit: 2015-08-24 | Discharge: 2015-08-24 | Disposition: A | Discharge status: Home / Self Care | Payer: Managed Care, Other (non HMO) | Source: Ambulatory Visit | Attending: Cardiology | Admitting: Cardiology

## 2015-08-24 VITALS — BP 138/80 | HR 91 | Wt 262.8 lb

## 2015-08-24 DIAGNOSIS — I951 Orthostatic hypotension: Secondary | ICD-10-CM

## 2015-08-24 DIAGNOSIS — I5022 Chronic systolic (congestive) heart failure: Secondary | ICD-10-CM | POA: Diagnosis not present

## 2015-08-24 DIAGNOSIS — Z7984 Long term (current) use of oral hypoglycemic drugs: Secondary | ICD-10-CM | POA: Diagnosis not present

## 2015-08-24 DIAGNOSIS — Z79899 Other long term (current) drug therapy: Secondary | ICD-10-CM | POA: Diagnosis not present

## 2015-08-24 DIAGNOSIS — I428 Other cardiomyopathies: Secondary | ICD-10-CM | POA: Diagnosis not present

## 2015-08-24 DIAGNOSIS — I5032 Chronic diastolic (congestive) heart failure: Secondary | ICD-10-CM | POA: Insufficient documentation

## 2015-08-24 DIAGNOSIS — Z8249 Family history of ischemic heart disease and other diseases of the circulatory system: Secondary | ICD-10-CM | POA: Insufficient documentation

## 2015-08-24 DIAGNOSIS — E119 Type 2 diabetes mellitus without complications: Secondary | ICD-10-CM | POA: Insufficient documentation

## 2015-08-24 DIAGNOSIS — Z95810 Presence of automatic (implantable) cardiac defibrillator: Secondary | ICD-10-CM | POA: Insufficient documentation

## 2015-08-24 MED ORDER — CARVEDILOL 6.25 MG PO TABS: 3.1250 mg | ORAL_TABLET | Freq: Two times a day (BID) | ORAL | Status: DC

## 2015-08-24 NOTE — Progress Notes (Signed)
Patient ID: Justin Bernard, male   DOB: 29-Aug-1960, 55 y.o.   MRN: [id number]    Advanced Heart Failure Clinic Note   Cardiology: Dr. Aundra Dubin  55 yo with history of nonischemic cardiomyopathy and recent fall with humeral fracture presents for cardiology clinic followup.    CHF was first noted in 07/2010 per patient when he developed dyspnea. He was seen at [facility]. Of note, mother had CHF diagnosis around 65 and grandfather had CHF diagnosis around 85. Pt's disease thought to be NICM with clean cath as below. 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  Initially, LVAD and transplant were considered, but EF improved greatly on medical management as echoes below demonstrate. 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 BPs were low at home over the same time period so he did cut his Coreg in half to 12.5 mg BID.  Despite this, he remained lightheaded with standing and fell, possibly with orthostatic syncope, fracturing his right humerus.  This was surgically repaired in 12/16.  Post-op, he developed volume overload and had to be diuresed. He was sent home on Coreg 6.25 mg bid, spironolactone 25 mg daily, and Lasix 40 mg daily. Echo this admission showed EF 50-55% .   He returns today for regular follow up. At last visit we had increased his lasix and potassium, as well as decreased his Coreg due to orthostatic hypotension. He is 14 lbs down from last visit and feels much better  Thinks he should be on more Coreg (previously on 50 mg BID). Denies SOB or edema.  No longer wearing compression stockings. Walked all around [employer] last week without difficulty. No further dizziness or lightheadedness. Weight has been stable at home.  Weight 258 +/- 1-2 lbs this morning. Still working with physical therapy and no further hypotension. No  orthopnea/PND. Taking all medications as directed. Watching salt and fluid intake.   Labs (12/16): K 3.5, creatinine 0.82, HCT 32.8  SH: He lives with his 2 nephews and son in [place].  He teaches at a university.   FH: Mother with CHF around 28, grandfather with CHF around 67.   ROS: All systems reviewed and negative except as per HPI.   PMH:  1. Chronic systolic CHF:  Nonischemic cardiomyopathy, initially followed at [facility]. [place] 08/13/10 [facility], [place]: areas or reversibility in anterior wall and lesser extent inferior wall. Dilated LV with EF 15% and severe hypokinesis. - LHC 08/31/10 [facility], [place]: Right dominant. Left main free of disease, branches into LAD and cicrumflex. Large caliber vessels with no significant stenosis. No intimal irregularities in RCA. Elevated LVEDP at 40 - ECHO 08/12/2010 [facility], [place]: EF <24% - ECHO 09/27/2010 [place], [place]: EF 15% with moderate RVE with marked dysfunction, moderate MR/TR, trace AR, mild PR, moderate LV dilation, moderate RV dilation, Severe LAE, mild RAE.  - ECHO 01/08/2012 [place], [place]: EF <25%, Grade 1 DD, MR/TR significantly improved. - ECHO 09/21/2011 [place], [place]: EF 45-55%, Grade 1 DD, RV mildly dilated and reduced, Trace MR/PR/TR, no AR. - ECHO 03/21/2012 [place], [place]: EF 45%, Grade 1 DD, Trace MR,PR,TR.  - ECHO 12/16 EF 50-55%, mild LVH.  2. Type II diabetes 3. Right humeral fracture (12/16).  4. Orthostatic hypotension.   Current Outpatient Prescriptions  Medication Sig Dispense Refill  . carvedilol (COREG) 3.125 MG tablet Take 1 tablet (  3.125 mg total) by mouth 2 (two) times daily with a meal. 60 tablet 6  . furosemide (LASIX) 40 MG tablet Take 1 tab (40 mg) in am and 1/2 tab (20 mg) in pm 45 tablet 6  . glucose monitoring kit (FREESTYLE) monitoring kit 1 each by Does not apply route 4 (four) times daily - after meals and at bedtime. 1 month Diabetic Testing Supplies for QAC-QHS accuchecks.  1 each 1  . metFORMIN (GLUCOPHAGE) 500 MG tablet Take 1 tablet (500 mg total) by mouth 2 (two) times daily with a meal. 60 tablet 0  . methocarbamol (ROBAXIN) 500 MG tablet Take 500 mg by mouth 4 (four) times daily.    . polyethylene glycol (MIRALAX / GLYCOLAX) packet Take 17 g by mouth daily as needed for mild constipation. 14 each 0  . potassium chloride SA (K-DUR,KLOR-CON) 20 MEQ tablet Take 1 tab (20 meq) in am and 1/2 tab (10 meq) in pm 45 tablet 6  . spironolactone (ALDACTONE) 25 MG tablet Take 1 tablet (25 mg total) by mouth daily. 90 tablet 3   No current facility-administered medications for this encounter.   BP 138/80 mmHg  Pulse 91  Wt 262 lb 12 oz (119.183 kg)  SpO2 98%   Wt Readings from Last 3 Encounters:  08/24/15 262 lb 12 oz (119.183 kg)  08/04/15 265 lb 3.2 oz (120.294 kg)  07/29/15 276 lb 8 oz (125.42 kg)   General: NAD Neck: JVP flat, no thyromegaly or lymphadenopathy.  Lungs: CTAB, normal effort.  CV: Nondisplaced PMI.  Heart regular S1/S2, no S3/S4, no murmur. No carotid bruit.  Normal pedal pulses.  Abdomen: Soft, NT, ND, no HSM. No bruits or masses. +BS  Skin: Intact without lesions or rashes.  Neurologic: Alert and oriented x 3.  Psych: Normal affect. Extremities: No clubbing or cyanosis. R Arm in Sling. No peripheral edema.  HEENT: Normal.   Assessment/Plan: 1. Chronic diastolic CHF: Echo 28/4/13 50-55%, improved from past (as low as 10% previously).  Volume status stable on exam (euvolemic). NYHA class II symptoms. Orthostatic hypotension seems to be resolved.  - Continue Lasix 40 qam/20 qpm and KCl 20 qam/10 qpm.  Can use sliding scale (extra 20 mg lasix) as needed. - Increase Coreg to 6.25 mg bid.  - Continue spironolactone.  - Check BMET/BNP today. - Is now following with Dr Lovena Le for his device. Home telemetry now hooked up and functional.  - Encouraged to continue to limit fluid intake < 2 L and sodium < 2000 mg - Knows to call HF clinic with  weight gain of 3 lbs overnight or 5 lbs in a week.  2. Orthostatic hypotension:  This seems to be resolved.   [name] Friar, PA-C 08/24/2015  Patient seen with PA, agree with the above note.  Orthostatic hypotension seems to have resolved.  Increase Coreg to 6.25 mg bid.  At next appointment, would be reasonable to start ACEI.   Loralie Champagne 08/25/2015

## 2015-08-24 NOTE — Patient Instructions (Signed)
INCREASE Coreg 6.25mg  twice a day.  Routine lab work today. (bmet bnp) Will notify you of abnormal results  FOLLOW UP: 1 month with Otilio Saber, PA  FOLLOW UP: 3 months with Dr.McLean

## 2015-09-15 ENCOUNTER — Other Ambulatory Visit (HOSPITAL_COMMUNITY): Payer: Self-pay | Admitting: *Deleted

## 2015-09-15 MED ORDER — CARVEDILOL 6.25 MG PO TABS: 3.1250 mg | ORAL_TABLET | Freq: Two times a day (BID) | ORAL | Status: DC

## 2015-09-16 ENCOUNTER — Other Ambulatory Visit: Payer: Self-pay | Admitting: Cardiology

## 2015-09-16 ENCOUNTER — Other Ambulatory Visit (HOSPITAL_COMMUNITY): Payer: Self-pay | Admitting: *Deleted

## 2015-09-16 MED ORDER — CARVEDILOL 6.25 MG PO TABS: 3.1250 mg | ORAL_TABLET | Freq: Two times a day (BID) | ORAL | Status: DC

## 2015-09-18 ENCOUNTER — Other Ambulatory Visit (HOSPITAL_COMMUNITY): Payer: Self-pay | Admitting: *Deleted

## 2015-09-24 ENCOUNTER — Encounter (HOSPITAL_COMMUNITY): Payer: Managed Care, Other (non HMO)

## 2015-09-29 ENCOUNTER — Ambulatory Visit (HOSPITAL_COMMUNITY)
Admission: RE | Admit: 2015-09-29 | Discharge: 2015-09-29 | Disposition: A | Discharge status: Home / Self Care | Payer: Managed Care, Other (non HMO) | Source: Ambulatory Visit | Attending: Internal Medicine | Admitting: Internal Medicine

## 2015-09-29 VITALS — BP 142/80 | HR 80 | Wt 264.0 lb

## 2015-09-29 DIAGNOSIS — Z95810 Presence of automatic (implantable) cardiac defibrillator: Secondary | ICD-10-CM | POA: Insufficient documentation

## 2015-09-29 DIAGNOSIS — E119 Type 2 diabetes mellitus without complications: Secondary | ICD-10-CM | POA: Insufficient documentation

## 2015-09-29 DIAGNOSIS — Z8249 Family history of ischemic heart disease and other diseases of the circulatory system: Secondary | ICD-10-CM | POA: Diagnosis not present

## 2015-09-29 DIAGNOSIS — I428 Other cardiomyopathies: Secondary | ICD-10-CM | POA: Diagnosis not present

## 2015-09-29 DIAGNOSIS — Z79899 Other long term (current) drug therapy: Secondary | ICD-10-CM | POA: Insufficient documentation

## 2015-09-29 DIAGNOSIS — Z7984 Long term (current) use of oral hypoglycemic drugs: Secondary | ICD-10-CM | POA: Diagnosis not present

## 2015-09-29 DIAGNOSIS — I5022 Chronic systolic (congestive) heart failure: Secondary | ICD-10-CM | POA: Insufficient documentation

## 2015-09-29 DIAGNOSIS — I951 Orthostatic hypotension: Secondary | ICD-10-CM

## 2015-09-29 LAB — BASIC METABOLIC PANEL
Anion gap: 13 (ref 5–15)
BUN: 11 mg/dL (ref 6–20)
CHLORIDE: 104 mmol/L (ref 101–111)
CO2: 23 mmol/L (ref 22–32)
CREATININE: 0.79 mg/dL (ref 0.61–1.24)
Calcium: 9.2 mg/dL (ref 8.9–10.3)
GFR calc Af Amer: >60 mL/min (ref >60)
GFR calc non Af Amer: >60 mL/min (ref >60)
GLUCOSE: 122 mg/dL — AB (ref 65–99)
POTASSIUM: 3.1 mmol/L — AB (ref 3.5–5.1)
SODIUM: 140 mmol/L (ref 135–145)

## 2015-09-29 MED ORDER — LISINOPRIL 2.5 MG PO TABS: 2.5000 mg | ORAL_TABLET | Freq: Every day | ORAL | Status: DC

## 2015-09-29 NOTE — Patient Instructions (Signed)
Please begin taking Lisinopril 2.5 mg at bedtime.  Labwork today and again 2 weeks  Follow-up in 6-8 weeks with Dr. Shirlee Latch

## 2015-09-29 NOTE — Progress Notes (Signed)
Patient ID: Justin Bernard, male   DOB: Jan 09, 1961, 55 y.o.   MRN: [id number]  Cardiology: Dr. Aundra Dubin  55 yo with history of nonischemic cardiomyopathy and recent fall with humeral fracture.    CHF was first noted in 07/2010 per patient when he developed dyspnea. He was seen at [facility]. Of note, mother had CHF diagnosis around 19 and grandfather had CHF diagnosis around 52. Pt's disease thought to be NICM with clean cath as below. 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  Initially, LVAD and transplant were considered, but EF improved greatly on medical management as echoes below demonstrate. 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 BPs were low at home over the same time period so he did cut his Coreg in half to 12.5 mg BID.  Despite this, he remained lightheaded with standing and fell, possibly with orthostatic syncope, fracturing his right humerus.  This was surgically repaired in 12/16.  Post-op, he developed volume overload and had to be diuresed. He was sent home on Coreg 6.25 mg bid, spironolactone 25 mg daily, and Lasix 40 mg daily. Echo this admission showed EF 50-55% .   He returns for follow up.  Overall feeling ok. Denies SOB/PND/Orthopnea. Weight at home 262-264 pounds. Rarely dizzy.  Continues to with PT. Ambulates with a cane. Taking all medications. Works full time at [employer].  Labs (12/16): K 3.5, creatinine 0.82, HCT 0.82  SH: He lives with his 2 nephews and son in [place].  He teaches at a university.   FH: Mother with CHF around 10, grandfather with CHF around 80.   ROS: All systems reviewed and negative except as per HPI.   PMH:  1. Chronic systolic CHF:  Nonischemic cardiomyopathy, initially followed at [facility]. [place] 08/13/10 [facility], [place]: areas or  reversibility in anterior wall and lesser extent inferior wall. Dilated LV with EF 15% and severe hypokinesis. - LHC 08/31/10 [facility], [place]: Right dominant. Left main free of disease, branches into LAD and cicrumflex. Large caliber vessels with no significant stenosis. No intimal irregularities in RCA. Elevated LVEDP at 40 - ECHO 08/12/2010 [facility], [place]: EF <24% - ECHO 09/27/2010 [place], [place]: EF 15% with moderate RVE with marked dysfunction, moderate MR/TR, trace AR, mild PR, moderate LV dilation, moderate RV dilation, Severe LAE, mild RAE.  - ECHO 01/08/2012 [place], [place]: EF <25%, Grade 1 DD, MR/TR significantly improved. - ECHO 09/21/2011 [place]: EF 45-55%, Grade 1 DD, RV mildly dilated and reduced, Trace MR/PR/TR, no AR. - ECHO 03/21/2012 [place]: EF 45%, Grade 1 DD, Trace MR,PR,TR.  - ECHO 12/16 EF 50-55%, mild LVH.  2. Type II diabetes 3. Right humeral fracture (12/16).  4. Orthostatic hypotension.   Current Outpatient Prescriptions  Medication Sig Dispense Refill  . furosemide (LASIX) 40 MG tablet Take 1 tab (40 mg) in am and 1/2 tab (20 mg) in pm 45 tablet 6  . glucose monitoring kit (FREESTYLE) monitoring kit 1 each by Does not apply route 4 (four) times daily - after meals and at bedtime. 1 month Diabetic Testing Supplies for QAC-QHS accuchecks. 1 each 1  . metFORMIN (GLUCOPHAGE) 500 MG tablet Take 1 tablet (500 mg total) by mouth 2 (two) times daily with a meal. 60 tablet 0  . methocarbamol (ROBAXIN) 500 MG tablet Take 500 mg by mouth every 6 (  six) hours as needed.     . polyethylene glycol (MIRALAX / GLYCOLAX) packet Take 17 g by mouth daily as needed for mild constipation. 14 each 0  . potassium chloride SA (K-DUR,KLOR-CON) 20 MEQ tablet Take 1 tab (20 meq) in am and 1/2 tab (10 meq) in pm 45 tablet 6  . spironolactone (ALDACTONE) 25 MG tablet Take 1 tablet (25 mg total) by mouth daily. 90 tablet 3  . carvedilol (COREG) 6.25 MG tablet Take 0.5 tablets (3.125 mg total)  by mouth 2 (two) times daily with a meal. (Patient taking differently: Take 6.25 mg by mouth 2 (two) times daily with a meal. ) 90 tablet 3   No current facility-administered medications for this encounter.   BP 142/80 mmHg  Pulse 80  Wt 264 lb (119.75 kg)  SpO2 96% General: NAD Neck: JVP 7-8 cm, no thyromegaly or thyroid nodule. Ambulated in the clinic with a cane.  Lungs: Clear to auscultation bilaterally with normal respiratory effort. CV: Nondisplaced PMI.  Heart regular S1/S2, no S3/S4, no murmur.  Trace edema.  No carotid bruit.  Normal pedal pulses.  Abdomen: Soft, nontender, no hepatosplenomegaly, no distention.  Skin: Intact without lesions or rashes.  Neurologic: Alert and oriented x 3.  Psych: Normal affect. Extremities: No clubbing or cyanosis.  HEENT: Normal.   Assessment/Plan: 1. Chronic diastolic CHF: Low EF in past with nonischemic cardiomyopathy.  EF in December 2016 50-55%.  NYHA class II symptoms. Volume status stable.  Continue  Lasix 40 qam/20 qpm.  Continue KCl to 20 qam/10 qpm.   - Continue  Coreg to 6.25 5 mg bid.  - Add 2.5 mg lisinopril at bed time.  Check BMET today and in 2 weeks.  2. Orthostatic hypotension: Resolved.  Follow up in 6 weeks with Dr Aundra Dubin.   Amy Clegg 09/29/2015

## 2015-09-30 ENCOUNTER — Telehealth (HOSPITAL_COMMUNITY): Payer: Self-pay | Admitting: Cardiology

## 2015-09-30 MED ORDER — POTASSIUM CHLORIDE CRYS ER 20 MEQ PO TBCR: 20.0000 meq | EXTENDED_RELEASE_TABLET | Freq: Two times a day (BID) | ORAL | Status: DC

## 2015-09-30 NOTE — Telephone Encounter (Signed)
Patient aware and voiced understanding

## 2015-09-30 NOTE — Telephone Encounter (Signed)
-----   Message from Sherald Hess, NP sent at 09/29/2015  3:34 PM EST ----- Please call him and ask him to take an extra 40 meq of potassium. Then take 20 meq twice a day.

## 2015-10-16 ENCOUNTER — Ambulatory Visit (HOSPITAL_COMMUNITY)
Admission: RE | Admit: 2015-10-16 | Discharge: 2015-10-16 | Disposition: A | Discharge status: Home / Self Care | Payer: Managed Care, Other (non HMO) | Source: Ambulatory Visit | Attending: Cardiology | Admitting: Cardiology

## 2015-10-16 DIAGNOSIS — I5022 Chronic systolic (congestive) heart failure: Secondary | ICD-10-CM | POA: Insufficient documentation

## 2015-10-16 LAB — BASIC METABOLIC PANEL
Anion gap: 14 (ref 5–15)
BUN: 15 mg/dL (ref 6–20)
CHLORIDE: 101 mmol/L (ref 101–111)
CO2: 20 mmol/L — ABNORMAL LOW (ref 22–32)
CREATININE: 0.67 mg/dL (ref 0.61–1.24)
Calcium: 9.2 mg/dL (ref 8.9–10.3)
GFR calc Af Amer: >60 mL/min (ref >60)
GFR calc non Af Amer: >60 mL/min (ref >60)
Glucose, Bld: 114 mg/dL — ABNORMAL HIGH (ref 65–99)
Potassium: 3.7 mmol/L (ref 3.5–5.1)
Sodium: 135 mmol/L (ref 135–145)

## 2015-10-16 NOTE — Progress Notes (Signed)
Pt in today for lab work, after he stood up in the lobby he became very dizzy and felt like he was going to pass out, pt immediately sat back down and after a min felt finel  HIs BP sitting was 92/60 and when he stood BP was 78/50.  Reviewed meds with pt and he states he has been holding his evening dose of Lasix most of the time because he felt he didn't need it.  At last OV 2/14 we started him on Lisinopril 2.5 mg at bed time which he has been taking.  Bmet drawn.  Discussed all with Dr Shirlee Latch, he would like pt to hold Lasix for 1 day then continue taking 40 mg in the AM and 20 mg in PM if needed and stop Lisinopril

## 2015-10-16 NOTE — Addendum Note (Signed)
Encounter addended by: Noralee Space, RN on: 10/16/2015  9:53 AM<BR>     Documentation filed: Notes Section, Patient Instructions Section, Medications, Orders

## 2015-10-16 NOTE — Patient Instructions (Signed)
Stop Lisinopril  Do not take anymore Lasix (furosemide) today and do not take it tomorrow, restart on Sunday  Keep follow up as scheduled, if you have another episode like this again please let us know.

## 2015-11-03 ENCOUNTER — Telehealth: Payer: Self-pay | Admitting: Cardiology

## 2015-11-03 ENCOUNTER — Ambulatory Visit (INDEPENDENT_AMBULATORY_CARE_PROVIDER_SITE_OTHER): Payer: Managed Care, Other (non HMO) | Admitting: *Deleted

## 2015-11-03 DIAGNOSIS — Z95810 Presence of automatic (implantable) cardiac defibrillator: Secondary | ICD-10-CM | POA: Diagnosis not present

## 2015-11-03 NOTE — Telephone Encounter (Signed)
LMOVM reminding pt to send remote transmission.   

## 2015-11-04 NOTE — Progress Notes (Signed)
Remote ICD transmission.   

## 2015-11-12 ENCOUNTER — Ambulatory Visit (HOSPITAL_COMMUNITY)
Admission: RE | Admit: 2015-11-12 | Discharge: 2015-11-12 | Disposition: A | Discharge status: Home / Self Care | Payer: Managed Care, Other (non HMO) | Source: Ambulatory Visit | Attending: Cardiology | Admitting: Cardiology

## 2015-11-12 ENCOUNTER — Encounter (HOSPITAL_COMMUNITY): Payer: Self-pay

## 2015-11-12 VITALS — BP 106/64 | HR 92 | Ht 76.0 in | Wt 261.1 lb

## 2015-11-12 DIAGNOSIS — I951 Orthostatic hypotension: Secondary | ICD-10-CM | POA: Diagnosis not present

## 2015-11-12 DIAGNOSIS — Z8249 Family history of ischemic heart disease and other diseases of the circulatory system: Secondary | ICD-10-CM | POA: Diagnosis not present

## 2015-11-12 DIAGNOSIS — I5022 Chronic systolic (congestive) heart failure: Secondary | ICD-10-CM

## 2015-11-12 DIAGNOSIS — Z79899 Other long term (current) drug therapy: Secondary | ICD-10-CM | POA: Insufficient documentation

## 2015-11-12 DIAGNOSIS — I5032 Chronic diastolic (congestive) heart failure: Secondary | ICD-10-CM | POA: Insufficient documentation

## 2015-11-12 DIAGNOSIS — Z95810 Presence of automatic (implantable) cardiac defibrillator: Secondary | ICD-10-CM | POA: Insufficient documentation

## 2015-11-12 DIAGNOSIS — I428 Other cardiomyopathies: Secondary | ICD-10-CM | POA: Insufficient documentation

## 2015-11-12 DIAGNOSIS — E119 Type 2 diabetes mellitus without complications: Secondary | ICD-10-CM | POA: Diagnosis not present

## 2015-11-12 LAB — BASIC METABOLIC PANEL
ANION GAP: 12 (ref 5–15)
BUN: 7 mg/dL (ref 6–20)
CALCIUM: 9.1 mg/dL (ref 8.9–10.3)
CHLORIDE: 104 mmol/L (ref 101–111)
CO2: 22 mmol/L (ref 22–32)
Creatinine, Ser: 0.85 mg/dL (ref 0.61–1.24)
GLUCOSE: 180 mg/dL — AB (ref 65–99)
POTASSIUM: 3.4 mmol/L — AB (ref 3.5–5.1)
Sodium: 138 mmol/L (ref 135–145)

## 2015-11-12 LAB — BRAIN NATRIURETIC PEPTIDE: B NATRIURETIC PEPTIDE 5: 31.2 pg/mL (ref 0.0–100.0)

## 2015-11-12 NOTE — Patient Instructions (Signed)
Routine lab work today. Will notify you of abnormal results  Follow up with Dr.McLean in 4 months 

## 2015-11-12 NOTE — Progress Notes (Signed)
Patient ID: Justin Bernard, male   DOB: [birth year], 55 y.o.   MRN: [id number]  Cardiology: Dr. Aundra Dubin  55 yo with history of nonischemic cardiomyopathy and recent fall with humeral fracture.    CHF was first noted in 07/2010 per patient when he developed dyspnea. He was seen at [facility]. Of note, mother had CHF diagnosis around 95 and grandfather had CHF diagnosis around 10. Pt's disease thought to be NICM with clean cath as below. 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  Initially, LVAD and transplant were considered, but EF improved greatly on medical management as echoes below demonstrate. 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 BPs were low at home over the same time period so he did cut his Coreg in half to 12.5 mg BID.  Despite this, he remained lightheaded with standing and fell, possibly with orthostatic syncope, fracturing his right humerus.  This was surgically repaired in 12/16.  Post-op, he developed volume overload and had to be diuresed. He was sent home on Coreg 6.25 mg bid, spironolactone 25 mg daily, and Lasix 40 mg daily. Echo this admission showed EF 50-55% .   He returns for followup.  Overall feeling ok.  At a prior appointment, we tried him on lisinopril at low dose.  He developed orthostatic symptoms and lisinopril was stopped.  No exertional dyspnea or chest pain.  Still working full time at [employer].  No palpitations. No further dizzy spells.   Labs (12/16): K 3.5, creatinine 0.82, HCT 0.82 Labs (3/17): K 3.7, creatinine 0.67  SH: He lives with his 2 nephews and son in [place].  He teaches at a university.   FH: Mother with CHF around 15, grandfather with CHF around 11.   ROS: All systems reviewed and negative except as per HPI.   PMH:  1. Chronic systolic CHF:  Nonischemic  cardiomyopathy, initially followed at [facility]. [place] 08/13/10 [facility], [place]: areas or reversibility in anterior wall and lesser extent inferior wall. Dilated LV with EF 15% and severe hypokinesis. - [facility] 08/31/10 [facility], [place]: Right dominant. Left main free of disease, branches into LAD and cicrumflex. Large caliber vessels with no significant stenosis. No intimal irregularities in RCA. Elevated LVEDP at 40 - ECHO 08/12/2010 [facility], [place]: EF <24% - ECHO 09/27/2010 [place], [place]: EF 15% with moderate RVE with marked dysfunction, moderate MR/TR, trace AR, mild PR, moderate LV dilation, moderate RV dilation, Severe LAE, mild RAE.  - ECHO 01/08/2012 [place], [place]: EF <25%, Grade 1 DD, MR/TR significantly improved. - ECHO 09/21/2011 [place]: EF 45-55%, Grade 1 DD, RV mildly dilated and reduced, Trace MR/PR/TR, no AR. - ECHO 03/21/2012 [place]: EF 45%, Grade 1 DD, Trace MR,PR,TR.  - ECHO 12/16 EF 50-55%, mild LVH.  - Hypotensive with low dose lisinopril. 2. Type II diabetes 3. Right humeral fracture (12/16).  4. Orthostatic hypotension.   Current Outpatient Prescriptions  Medication Sig Dispense Refill  . carvedilol (COREG) 6.25 MG tablet Take 6.25 mg by mouth 2 (two) times daily with a meal.    . furosemide (LASIX) 40 MG tablet Take 40 mg by mouth daily. Take 20 mg in PM as needed    . glucose monitoring kit (FREESTYLE) monitoring kit 1 each by Does not apply route 4 (four) times daily - after meals and at bedtime. 1 month  Diabetic Testing Supplies for QAC-QHS accuchecks. 1 each 1  . methocarbamol (ROBAXIN) 500 MG tablet Take 500 mg by mouth every 6 (six) hours as needed.     . polyethylene glycol (MIRALAX / GLYCOLAX) packet Take 17 g by mouth daily as needed for mild constipation. 14 each 0  . potassium chloride SA (K-DUR,KLOR-CON) 20 MEQ tablet Take 1 tablet (20 mEq total) by mouth 2 (two) times daily. 60 tablet 6  . spironolactone (ALDACTONE) 25 MG  tablet Take 1 tablet (25 mg total) by mouth daily. 90 tablet 3  . traMADol (ULTRAM) 50 MG tablet Take 50 mg by mouth as needed (at bedtime and before physical therapy).     No current facility-administered medications for this encounter.   BP 106/64 mmHg  Pulse 92  Ht '6\' 4"'$  (1.93 m)  Wt 261 lb 1.9 oz (118.443 kg)  BMI 31.80 kg/m2  SpO2 95% General: NAD Neck: JVP 7 cm, no thyromegaly or thyroid nodule. Ambulated in the clinic with a cane.  Lungs: Clear to auscultation bilaterally with normal respiratory effort. CV: Nondisplaced PMI.  Heart regular S1/S2, no S3/S4, no murmur.  No edema.  No carotid bruit.  Normal pedal pulses.  Abdomen: Soft, nontender, no hepatosplenomegaly, no distention.  Skin: Intact without lesions or rashes.  Neurologic: Alert and oriented x 3.  Psych: Normal affect. Extremities: No clubbing or cyanosis.  HEENT: Normal.   Assessment/Plan: 1. Chronic diastolic CHF: Low EF in past with nonischemic cardiomyopathy.  EF in December 2016 50-55%.  NYHA class II symptoms. Volume status stable.   - Continue  Lasix 40 qam/20 qpm.   - Continue  Coreg to 6.25 mg bid.  - Check BMET/BNP today.  2. Orthostatic hypotension: Resolved.  Follow up in 4 months   Loralie Champagne 11/12/2015

## 2015-11-13 ENCOUNTER — Telehealth (HOSPITAL_COMMUNITY): Payer: Self-pay | Admitting: Cardiology

## 2015-11-13 DIAGNOSIS — I5022 Chronic systolic (congestive) heart failure: Secondary | ICD-10-CM

## 2015-11-13 MED ORDER — POTASSIUM CHLORIDE CRYS ER 20 MEQ PO TBCR: 20.0000 meq | EXTENDED_RELEASE_TABLET | ORAL | Status: DC

## 2015-11-13 NOTE — Telephone Encounter (Signed)
[phone] (Home) patient aware and voiced understanding Repeat labs 4/21

## 2015-12-04 ENCOUNTER — Other Ambulatory Visit (HOSPITAL_COMMUNITY): Payer: Managed Care, Other (non HMO)

## 2015-12-07 ENCOUNTER — Other Ambulatory Visit (HOSPITAL_COMMUNITY): Payer: Self-pay | Admitting: Adult Health

## 2015-12-11 ENCOUNTER — Ambulatory Visit (HOSPITAL_COMMUNITY)
Admission: RE | Admit: 2015-12-11 | Discharge: 2015-12-11 | Disposition: A | Discharge status: Home / Self Care | Payer: Managed Care, Other (non HMO) | Source: Ambulatory Visit | Attending: Cardiology | Admitting: Cardiology

## 2015-12-11 DIAGNOSIS — I5022 Chronic systolic (congestive) heart failure: Secondary | ICD-10-CM | POA: Insufficient documentation

## 2015-12-11 LAB — BASIC METABOLIC PANEL
Anion gap: 13 (ref 5–15)
BUN: 14 mg/dL (ref 6–20)
CALCIUM: 9.4 mg/dL (ref 8.9–10.3)
CHLORIDE: 104 mmol/L (ref 101–111)
CO2: 23 mmol/L (ref 22–32)
CREATININE: 0.7 mg/dL (ref 0.61–1.24)
Glucose, Bld: 113 mg/dL — ABNORMAL HIGH (ref 65–99)
Potassium: 3.5 mmol/L (ref 3.5–5.1)
SODIUM: 140 mmol/L (ref 135–145)

## 2015-12-15 LAB — CUP PACEART REMOTE DEVICE CHECK
HighPow Impedance: 50 Ohm
Implantable Lead Model: 296
Lead Channel Impedance Value: 427 Ohm
Lead Channel Setting Pacing Pulse Width: 0.5 ms
MDC IDC LEAD IMPLANT DT: 20120222
MDC IDC LEAD LOCATION: 753860
MDC IDC LEAD SERIAL: 100951
MDC IDC MSMT BATTERY REMAINING LONGEVITY: 96 mo
MDC IDC MSMT BATTERY REMAINING PERCENTAGE: 100 %
MDC IDC SESS DTM: 20170322123100
MDC IDC SET LEADCHNL RV PACING AMPLITUDE: 2.5 V
MDC IDC SET LEADCHNL RV SENSING SENSITIVITY: 0.6 mV
MDC IDC STAT BRADY RV PERCENT PACED: 0 %
Pulse Gen Serial Number: 100238

## 2015-12-16 ENCOUNTER — Encounter: Payer: Self-pay | Admitting: Cardiology

## 2015-12-23 ENCOUNTER — Encounter: Payer: Self-pay | Admitting: Cardiology

## 2016-01-18 ENCOUNTER — Other Ambulatory Visit (HOSPITAL_COMMUNITY): Payer: Self-pay

## 2016-01-18 ENCOUNTER — Telehealth (HOSPITAL_COMMUNITY): Payer: Self-pay

## 2016-01-18 MED ORDER — CARVEDILOL 6.25 MG PO TABS: 6.2500 mg | ORAL_TABLET | Freq: Two times a day (BID) | ORAL | Status: DC

## 2016-01-18 NOTE — Telephone Encounter (Signed)
Patient left VM on CHF triage line asking to clarify coreg dose. Left return VM after confirming dose via last OV note from Dr. Shirlee Latch in march and any recent labs that correct dose should be 6.25 mg tablet twice daily and that refill will be sent to patient's preferred pharmacy electronically. Advised to call back with any further questions/concerns.  Ave Filter

## 2016-02-02 ENCOUNTER — Ambulatory Visit (INDEPENDENT_AMBULATORY_CARE_PROVIDER_SITE_OTHER): Payer: Managed Care, Other (non HMO) | Admitting: *Deleted

## 2016-02-02 DIAGNOSIS — Z95810 Presence of automatic (implantable) cardiac defibrillator: Secondary | ICD-10-CM

## 2016-02-02 NOTE — Progress Notes (Signed)
Remote ICD transmission.   

## 2016-02-03 LAB — CUP PACEART REMOTE DEVICE CHECK
Battery Remaining Longevity: 90 mo
Brady Statistic RV Percent Paced: 0 %
Date Time Interrogation Session: 20170620103800
HIGH POWER IMPEDANCE MEASURED VALUE: 52 Ohm
Implantable Lead Location: 753860
Implantable Lead Serial Number: 100951
Lead Channel Impedance Value: 411 Ohm
Lead Channel Setting Sensing Sensitivity: 0.6 mV
MDC IDC LEAD IMPLANT DT: 20120222
MDC IDC LEAD MODEL: 296
MDC IDC MSMT BATTERY REMAINING PERCENTAGE: 96 %
MDC IDC PG SERIAL: 100238
MDC IDC SET LEADCHNL RV PACING AMPLITUDE: 2.5 V
MDC IDC SET LEADCHNL RV PACING PULSEWIDTH: 0.5 ms

## 2016-02-05 ENCOUNTER — Encounter: Payer: Self-pay | Admitting: Cardiology

## 2016-03-19 ENCOUNTER — Other Ambulatory Visit: Payer: Self-pay | Admitting: Orthopedic Surgery

## 2016-03-19 DIAGNOSIS — S42291D Other displaced fracture of upper end of right humerus, subsequent encounter for fracture with routine healing: Secondary | ICD-10-CM

## 2016-03-25 ENCOUNTER — Ambulatory Visit
Admission: RE | Admit: 2016-03-25 | Discharge: 2016-03-25 | Disposition: A | Discharge status: Home / Self Care | Payer: Managed Care, Other (non HMO) | Source: Ambulatory Visit | Attending: Orthopedic Surgery | Admitting: Orthopedic Surgery

## 2016-03-25 ENCOUNTER — Other Ambulatory Visit: Payer: Managed Care, Other (non HMO)

## 2016-03-25 DIAGNOSIS — S42291D Other displaced fracture of upper end of right humerus, subsequent encounter for fracture with routine healing: Secondary | ICD-10-CM

## 2016-04-23 ENCOUNTER — Other Ambulatory Visit (HOSPITAL_COMMUNITY): Payer: Self-pay | Admitting: Adult Health

## 2016-04-28 ENCOUNTER — Telehealth: Payer: Self-pay | Admitting: Cardiology

## 2016-04-28 ENCOUNTER — Telehealth (HOSPITAL_COMMUNITY): Payer: Self-pay | Admitting: Vascular Surgery

## 2016-04-28 ENCOUNTER — Encounter (HOSPITAL_COMMUNITY): Payer: Self-pay | Admitting: *Deleted

## 2016-04-28 NOTE — Telephone Encounter (Signed)
See other phone note

## 2016-04-28 NOTE — Telephone Encounter (Signed)
Pt aware and picked up note, he will take to Dr Magdalene Patricia office

## 2016-04-28 NOTE — Telephone Encounter (Signed)
New Message  Justin Bernard from Dr. Magdalene Patricia office call to f/u on surgical clearance. She states Dr. Carola Frost wants to proceed with surgery on 9/15. Please call back to discuss

## 2016-04-28 NOTE — Telephone Encounter (Signed)
I have reviewed the chart.  Patient is stable, EF 50-55%.  Ok from my standpoint for him to go for arm surgery.

## 2016-04-28 NOTE — Telephone Encounter (Signed)
Patient needs to have orthopaedic surgery and Dr. Magdalene Patricia office needs the surgical clearance.  Not sure if this needs to come from Dr. Ladona Ridgel or Dr. Shirlee Latch.  Office [facility] # [phone] and fax [phone].

## 2016-04-28 NOTE — Telephone Encounter (Signed)
PT called , he wants someone to call him back about clearance for surgery please advise

## 2016-04-29 ENCOUNTER — Ambulatory Visit (HOSPITAL_COMMUNITY): Payer: Managed Care, Other (non HMO)

## 2016-04-29 ENCOUNTER — Encounter (HOSPITAL_COMMUNITY): Payer: Self-pay

## 2016-04-29 ENCOUNTER — Ambulatory Visit (HOSPITAL_COMMUNITY): Payer: Managed Care, Other (non HMO) | Admitting: Certified Registered"

## 2016-04-29 ENCOUNTER — Encounter (HOSPITAL_COMMUNITY)
Admission: RE | Disposition: A | Discharge status: Home / Self Care | Payer: Self-pay | Source: Ambulatory Visit | Attending: Orthopedic Surgery

## 2016-04-29 ENCOUNTER — Inpatient Hospital Stay (HOSPITAL_COMMUNITY)
Admission: RE | Admit: 2016-04-29 | Discharge: 2016-04-30 | DRG: 493 | Disposition: A | Discharge status: Home / Self Care | Payer: Managed Care, Other (non HMO) | Source: Ambulatory Visit | Attending: Orthopedic Surgery | Admitting: Orthopedic Surgery

## 2016-04-29 DIAGNOSIS — Z7984 Long term (current) use of oral hypoglycemic drugs: Secondary | ICD-10-CM

## 2016-04-29 DIAGNOSIS — M898X9 Other specified disorders of bone, unspecified site: Secondary | ICD-10-CM

## 2016-04-29 DIAGNOSIS — S42201P Unspecified fracture of upper end of right humerus, subsequent encounter for fracture with malunion: Secondary | ICD-10-CM

## 2016-04-29 DIAGNOSIS — I428 Other cardiomyopathies: Secondary | ICD-10-CM | POA: Diagnosis present

## 2016-04-29 DIAGNOSIS — I110 Hypertensive heart disease with heart failure: Secondary | ICD-10-CM | POA: Diagnosis present

## 2016-04-29 DIAGNOSIS — I5022 Chronic systolic (congestive) heart failure: Secondary | ICD-10-CM | POA: Diagnosis present

## 2016-04-29 DIAGNOSIS — Z419 Encounter for procedure for purposes other than remedying health state, unspecified: Secondary | ICD-10-CM

## 2016-04-29 DIAGNOSIS — Z79899 Other long term (current) drug therapy: Secondary | ICD-10-CM | POA: Diagnosis not present

## 2016-04-29 DIAGNOSIS — Z9889 Other specified postprocedural states: Secondary | ICD-10-CM

## 2016-04-29 DIAGNOSIS — Y831 Surgical operation with implant of artificial internal device as the cause of abnormal reaction of the patient, or of later complication, without mention of misadventure at the time of the procedure: Secondary | ICD-10-CM | POA: Diagnosis present

## 2016-04-29 DIAGNOSIS — Z95810 Presence of automatic (implantable) cardiac defibrillator: Secondary | ICD-10-CM | POA: Diagnosis not present

## 2016-04-29 DIAGNOSIS — T8484XA Pain due to internal orthopedic prosthetic devices, implants and grafts, initial encounter: Secondary | ICD-10-CM | POA: Diagnosis present

## 2016-04-29 DIAGNOSIS — E119 Type 2 diabetes mellitus without complications: Secondary | ICD-10-CM | POA: Diagnosis present

## 2016-04-29 DIAGNOSIS — X58XXXD Exposure to other specified factors, subsequent encounter: Secondary | ICD-10-CM | POA: Diagnosis present

## 2016-04-29 HISTORY — DX: Type 2 diabetes mellitus without complications: E11.9

## 2016-04-29 HISTORY — DX: Personal history of other diseases of the digestive system: Z87.19

## 2016-04-29 HISTORY — DX: Myoneural disorder, unspecified: G70.9

## 2016-04-29 HISTORY — DX: Unspecified osteoarthritis, unspecified site: M19.90

## 2016-04-29 HISTORY — PX: HARDWARE REMOVAL: SHX979

## 2016-04-29 HISTORY — DX: Presence of automatic (implantable) cardiac defibrillator: Z95.810

## 2016-04-29 HISTORY — DX: Family history of other specified conditions: Z84.89

## 2016-04-29 HISTORY — DX: Reserved for inherently not codable concepts without codable children: IMO0001

## 2016-04-29 HISTORY — DX: Essential (primary) hypertension: I10

## 2016-04-29 HISTORY — PX: BONE EXCISION: SHX6730

## 2016-04-29 LAB — CBC WITH DIFFERENTIAL/PLATELET
Basophils Absolute: 0.1 10*3/uL (ref 0.0–0.1)
Basophils Relative: 1 %
EOS PCT: 4 %
Eosinophils Absolute: 0.3 10*3/uL (ref 0.0–0.7)
HCT: 38.3 % — ABNORMAL LOW (ref 39.0–52.0)
Hemoglobin: 13.3 g/dL (ref 13.0–17.0)
Lymphocytes Relative: 16 %
Lymphs Abs: 1.2 10*3/uL (ref 0.7–4.0)
MCH: 38.8 pg — ABNORMAL HIGH (ref 26.0–34.0)
MCHC: 34.7 g/dL (ref 30.0–36.0)
MCV: 111.7 fL — AB (ref 78.0–100.0)
MONO ABS: 0.6 10*3/uL (ref 0.1–1.0)
MONOS PCT: 8 %
NEUTROS PCT: 71 %
Neutro Abs: 5.6 10*3/uL (ref 1.7–7.7)
PLATELETS: 296 10*3/uL (ref 150–400)
RBC: 3.43 MIL/uL — AB (ref 4.22–5.81)
RDW: 13.1 % (ref 11.5–15.5)
WBC: 7.8 10*3/uL (ref 4.0–10.5)

## 2016-04-29 LAB — GLUCOSE, CAPILLARY
GLUCOSE-CAPILLARY: 92 mg/dL (ref 65–99)
GLUCOSE-CAPILLARY: 163 mg/dL — AB (ref 65–99)
Glucose-Capillary: 119 mg/dL — ABNORMAL HIGH (ref 65–99)
Glucose-Capillary: 106 mg/dL — ABNORMAL HIGH (ref 65–99)

## 2016-04-29 LAB — COMPREHENSIVE METABOLIC PANEL
ALBUMIN: 3.8 g/dL (ref 3.5–5.0)
ALT: 32 U/L (ref 17–63)
AST: 47 U/L — AB (ref 15–41)
Alkaline Phosphatase: 83 U/L (ref 38–126)
Anion gap: 14 (ref 5–15)
BUN: 11 mg/dL (ref 6–20)
CHLORIDE: 104 mmol/L (ref 101–111)
CO2: 19 mmol/L — AB (ref 22–32)
CREATININE: 0.84 mg/dL (ref 0.61–1.24)
Calcium: 9.3 mg/dL (ref 8.9–10.3)
GFR calc Af Amer: >60 mL/min (ref >60)
GLUCOSE: 119 mg/dL — AB (ref 65–99)
Potassium: 3.3 mmol/L — ABNORMAL LOW (ref 3.5–5.1)
Sodium: 137 mmol/L (ref 135–145)
Total Bilirubin: 0.9 mg/dL (ref 0.3–1.2)
Total Protein: 6.9 g/dL (ref 6.5–8.1)

## 2016-04-29 LAB — TYPE AND SCREEN
ABO/RH(D): A POS
ANTIBODY SCREEN: NEGATIVE

## 2016-04-29 LAB — URINALYSIS, ROUTINE W REFLEX MICROSCOPIC
Bilirubin Urine: NEGATIVE
Glucose, UA: NEGATIVE mg/dL
Ketones, ur: 15 mg/dL — AB
NITRITE: NEGATIVE
PH: 6 (ref 5.0–8.0)
Protein, ur: 100 mg/dL — AB
SPECIFIC GRAVITY, URINE: 1.02 (ref 1.005–1.030)

## 2016-04-29 LAB — URINE MICROSCOPIC-ADD ON

## 2016-04-29 LAB — PROTIME-INR
INR: 1.07
PROTHROMBIN TIME: 14 s (ref 11.4–15.2)

## 2016-04-29 LAB — APTT: aPTT: 34 seconds (ref 24–36)

## 2016-04-29 LAB — ABO/RH: ABO/RH(D): A POS

## 2016-04-29 SURGERY — REMOVAL, HARDWARE
Anesthesia: General | Laterality: Right

## 2016-04-29 MED ORDER — FUROSEMIDE 20 MG PO TABS
20.0000 mg | ORAL_TABLET | Freq: Every day | ORAL | Status: DC
  Filled 2016-04-29: qty 1

## 2016-04-29 MED ORDER — ONDANSETRON HCL 4 MG PO TABS: 4.0000 mg | ORAL_TABLET | Freq: Four times a day (QID) | ORAL | Status: DC | PRN

## 2016-04-29 MED ORDER — MIDAZOLAM HCL 5 MG/5ML IJ SOLN
INTRAMUSCULAR | Status: DC | PRN
  Administered 2016-04-29: 2 mg via INTRAVENOUS

## 2016-04-29 MED ORDER — CARVEDILOL 6.25 MG PO TABS
6.2500 mg | ORAL_TABLET | Freq: Two times a day (BID) | ORAL | Status: DC
  Administered 2016-04-29 – 2016-04-30 (×2): 6.25 mg via ORAL
  Filled 2016-04-29 (×2): qty 1

## 2016-04-29 MED ORDER — ROCURONIUM BROMIDE 10 MG/ML (PF) SYRINGE
PREFILLED_SYRINGE | INTRAVENOUS | Status: AC
  Filled 2016-04-29: qty 10

## 2016-04-29 MED ORDER — BUPIVACAINE-EPINEPHRINE (PF) 0.5% -1:200000 IJ SOLN
INTRAMUSCULAR | Status: AC
  Filled 2016-04-29: qty 30

## 2016-04-29 MED ORDER — HYDROCODONE-ACETAMINOPHEN 7.5-325 MG PO TABS
1.0000 | ORAL_TABLET | Freq: Four times a day (QID) | ORAL | Status: DC | PRN
  Administered 2016-04-29: 1 via ORAL
  Administered 2016-04-30 (×2): 2 via ORAL
  Filled 2016-04-29 (×2): qty 2

## 2016-04-29 MED ORDER — VANCOMYCIN HCL IN DEXTROSE 1-5 GM/200ML-% IV SOLN
1000.0000 mg | Freq: Once | INTRAVENOUS | Status: AC
  Administered 2016-04-30: 1000 mg via INTRAVENOUS
  Filled 2016-04-29 (×2): qty 200

## 2016-04-29 MED ORDER — METOCLOPRAMIDE HCL 5 MG PO TABS: 5.0000 mg | ORAL_TABLET | Freq: Three times a day (TID) | ORAL | Status: DC | PRN

## 2016-04-29 MED ORDER — VANCOMYCIN HCL 10 G IV SOLR
1500.0000 mg | Freq: Once | INTRAVENOUS | Status: AC
  Administered 2016-04-29: 1500 mg via INTRAVENOUS
  Filled 2016-04-29: qty 1500

## 2016-04-29 MED ORDER — SUGAMMADEX SODIUM 200 MG/2ML IV SOLN
INTRAVENOUS | Status: DC | PRN
  Administered 2016-04-29: 250 mg via INTRAVENOUS

## 2016-04-29 MED ORDER — SPIRONOLACTONE 25 MG PO TABS
25.0000 mg | ORAL_TABLET | Freq: Every day | ORAL | Status: DC
  Administered 2016-04-30: 25 mg via ORAL
  Filled 2016-04-29: qty 1

## 2016-04-29 MED ORDER — FENTANYL CITRATE (PF) 100 MCG/2ML IJ SOLN
INTRAMUSCULAR | Status: AC
  Filled 2016-04-29: qty 2

## 2016-04-29 MED ORDER — CHLORHEXIDINE GLUCONATE 4 % EX LIQD: 60.0000 mL | Freq: Once | CUTANEOUS | Status: DC

## 2016-04-29 MED ORDER — BUPIVACAINE-EPINEPHRINE (PF) 0.5% -1:200000 IJ SOLN
INTRAMUSCULAR | Status: DC | PRN
  Administered 2016-04-29: 30 mL via PERINEURAL

## 2016-04-29 MED ORDER — HYDROCODONE-ACETAMINOPHEN 7.5-325 MG PO TABS
ORAL_TABLET | ORAL | Status: AC
  Administered 2016-04-29: 1 via ORAL
  Filled 2016-04-29: qty 1

## 2016-04-29 MED ORDER — ROCURONIUM 10MG/ML (10ML) SYRINGE FOR MEDFUSION PUMP - OPTIME
INTRAVENOUS | Status: DC | PRN
  Administered 2016-04-29: 50 mg via INTRAVENOUS
  Administered 2016-04-29 (×2): 20 mg via INTRAVENOUS

## 2016-04-29 MED ORDER — CEFAZOLIN IN D5W 1 GM/50ML IV SOLN: 1.0000 g | Freq: Four times a day (QID) | INTRAVENOUS | Status: DC

## 2016-04-29 MED ORDER — HYDROMORPHONE HCL 1 MG/ML IJ SOLN
0.2500 mg | INTRAMUSCULAR | Status: DC | PRN
  Administered 2016-04-29 (×4): 0.5 mg via INTRAVENOUS

## 2016-04-29 MED ORDER — ACETAMINOPHEN 650 MG RE SUPP: 650.0000 mg | Freq: Four times a day (QID) | RECTAL | Status: DC | PRN

## 2016-04-29 MED ORDER — MIDAZOLAM HCL 2 MG/2ML IJ SOLN
INTRAMUSCULAR | Status: AC
  Administered 2016-04-29: 2 mg via INTRAVENOUS
  Filled 2016-04-29: qty 2

## 2016-04-29 MED ORDER — FENTANYL CITRATE (PF) 100 MCG/2ML IJ SOLN
INTRAMUSCULAR | Status: DC | PRN
  Administered 2016-04-29: 200 ug via INTRAVENOUS
  Administered 2016-04-29 (×2): 50 ug via INTRAVENOUS
  Administered 2016-04-29: 100 ug via INTRAVENOUS

## 2016-04-29 MED ORDER — HYDROMORPHONE HCL 1 MG/ML IJ SOLN
0.5000 mg | INTRAMUSCULAR | Status: DC | PRN
  Administered 2016-04-29 – 2016-04-30 (×4): 1 mg via INTRAVENOUS
  Filled 2016-04-29 (×5): qty 1

## 2016-04-29 MED ORDER — ONDANSETRON HCL 4 MG/2ML IJ SOLN: 4.0000 mg | Freq: Four times a day (QID) | INTRAMUSCULAR | Status: DC | PRN

## 2016-04-29 MED ORDER — LIDOCAINE HCL (CARDIAC) 20 MG/ML IV SOLN
INTRAVENOUS | Status: DC | PRN
  Administered 2016-04-29: 60 mg via INTRAVENOUS

## 2016-04-29 MED ORDER — LIDOCAINE 2% (20 MG/ML) 5 ML SYRINGE
INTRAMUSCULAR | Status: AC
Start: 2016-04-29 — End: 2016-04-29
  Filled 2016-04-29: qty 5

## 2016-04-29 MED ORDER — 0.9 % SODIUM CHLORIDE (POUR BTL) OPTIME
TOPICAL | Status: DC | PRN
  Administered 2016-04-29: 1000 mL

## 2016-04-29 MED ORDER — PROPOFOL 10 MG/ML IV BOLUS
INTRAVENOUS | Status: DC | PRN
  Administered 2016-04-29: 200 mg via INTRAVENOUS

## 2016-04-29 MED ORDER — FENTANYL CITRATE (PF) 100 MCG/2ML IJ SOLN
50.0000 ug | Freq: Once | INTRAMUSCULAR | Status: AC
  Administered 2016-04-29: 50 ug via INTRAVENOUS

## 2016-04-29 MED ORDER — KETOROLAC TROMETHAMINE 10 MG PO TABS: 10.0000 mg | ORAL_TABLET | Freq: Four times a day (QID) | ORAL | 0 refills | Status: DC | PRN

## 2016-04-29 MED ORDER — HYDROCODONE-ACETAMINOPHEN 7.5-325 MG PO TABS
1.0000 | ORAL_TABLET | Freq: Four times a day (QID) | ORAL | 0 refills | Status: DC | PRN
Start: 2016-04-29 — End: 2016-09-08

## 2016-04-29 MED ORDER — DOCUSATE SODIUM 100 MG PO CAPS
100.0000 mg | ORAL_CAPSULE | Freq: Two times a day (BID) | ORAL | Status: DC
  Administered 2016-04-29 – 2016-04-30 (×2): 100 mg via ORAL
  Filled 2016-04-29 (×2): qty 1

## 2016-04-29 MED ORDER — THROMBIN 20000 UNITS EX KIT
PACK | CUTANEOUS | Status: DC | PRN
  Administered 2016-04-29: 20000 [IU] via TOPICAL

## 2016-04-29 MED ORDER — SODIUM CHLORIDE 0.9 % IV SOLN
INTRAVENOUS | Status: DC | PRN
  Administered 2016-04-29: 15:00:00 via INTRAVENOUS

## 2016-04-29 MED ORDER — HYDROMORPHONE HCL 1 MG/ML IJ SOLN
INTRAMUSCULAR | Status: AC
  Administered 2016-04-29: 0.5 mg via INTRAVENOUS
  Filled 2016-04-29: qty 1

## 2016-04-29 MED ORDER — FUROSEMIDE 20 MG PO TABS
20.0000 mg | ORAL_TABLET | Freq: Every day | ORAL | Status: DC
  Administered 2016-04-29 – 2016-04-30 (×2): 20 mg via ORAL
  Filled 2016-04-29: qty 1

## 2016-04-29 MED ORDER — PHENYLEPHRINE HCL 10 MG/ML IJ SOLN
INTRAMUSCULAR | Status: DC | PRN
  Administered 2016-04-29: 80 ug via INTRAVENOUS

## 2016-04-29 MED ORDER — BUPIVACAINE LIPOSOME 1.3 % IJ SUSP
20.0000 mL | INTRAMUSCULAR | Status: DC
  Filled 2016-04-29: qty 20

## 2016-04-29 MED ORDER — METOCLOPRAMIDE HCL 5 MG/ML IJ SOLN: 5.0000 mg | Freq: Three times a day (TID) | INTRAMUSCULAR | Status: DC | PRN

## 2016-04-29 MED ORDER — PHENYLEPHRINE 40 MCG/ML (10ML) SYRINGE FOR IV PUSH (FOR BLOOD PRESSURE SUPPORT)
PREFILLED_SYRINGE | INTRAVENOUS | Status: AC
  Filled 2016-04-29: qty 10

## 2016-04-29 MED ORDER — LACTATED RINGERS IV SOLN
INTRAVENOUS | Status: DC | PRN
  Administered 2016-04-29: 15:00:00 via INTRAVENOUS

## 2016-04-29 MED ORDER — POTASSIUM CHLORIDE IN NACL 20-0.9 MEQ/L-% IV SOLN
INTRAVENOUS | Status: DC
  Administered 2016-04-29: 22:00:00 via INTRAVENOUS
  Filled 2016-04-29: qty 1000

## 2016-04-29 MED ORDER — METHOCARBAMOL 500 MG PO TABS: 500.0000 mg | ORAL_TABLET | Freq: Four times a day (QID) | ORAL | Status: DC | PRN

## 2016-04-29 MED ORDER — HYDROCODONE-ACETAMINOPHEN 7.5-325 MG/15ML PO SOLN
ORAL | Status: AC
  Filled 2016-04-29: qty 15

## 2016-04-29 MED ORDER — POTASSIUM CHLORIDE CRYS ER 20 MEQ PO TBCR
20.0000 meq | EXTENDED_RELEASE_TABLET | Freq: Two times a day (BID) | ORAL | Status: DC
  Administered 2016-04-29 – 2016-04-30 (×2): 20 meq via ORAL
  Filled 2016-04-29 (×2): qty 1

## 2016-04-29 MED ORDER — ACETAMINOPHEN 500 MG PO TABS
1000.0000 mg | ORAL_TABLET | Freq: Once | ORAL | Status: AC
  Administered 2016-04-29: 1000 mg via ORAL
  Filled 2016-04-29: qty 2

## 2016-04-29 MED ORDER — CEFAZOLIN SODIUM-DEXTROSE 2-4 GM/100ML-% IV SOLN: 2.0000 g | INTRAVENOUS | Status: DC

## 2016-04-29 MED ORDER — PROPOFOL 10 MG/ML IV BOLUS
INTRAVENOUS | Status: AC
  Filled 2016-04-29: qty 20

## 2016-04-29 MED ORDER — THROMBIN 20000 UNITS EX KIT
PACK | CUTANEOUS | Status: AC
  Filled 2016-04-29: qty 1

## 2016-04-29 MED ORDER — MIDAZOLAM HCL 2 MG/2ML IJ SOLN
INTRAMUSCULAR | Status: AC
  Filled 2016-04-29: qty 2

## 2016-04-29 MED ORDER — LACTATED RINGERS IV SOLN
INTRAVENOUS | Status: DC
  Administered 2016-04-29 (×3): via INTRAVENOUS

## 2016-04-29 MED ORDER — ACETAMINOPHEN 325 MG PO TABS: 650.0000 mg | ORAL_TABLET | Freq: Four times a day (QID) | ORAL | Status: DC | PRN

## 2016-04-29 MED ORDER — KETOROLAC TROMETHAMINE 15 MG/ML IJ SOLN
15.0000 mg | Freq: Four times a day (QID) | INTRAMUSCULAR | Status: DC
  Administered 2016-04-30: 15 mg via INTRAVENOUS
  Filled 2016-04-29 (×2): qty 1

## 2016-04-29 MED ORDER — GLIPIZIDE ER 5 MG PO TB24
5.0000 mg | ORAL_TABLET | Freq: Every day | ORAL | Status: DC
  Administered 2016-04-30: 5 mg via ORAL
  Filled 2016-04-29: qty 1

## 2016-04-29 MED ORDER — ONDANSETRON HCL 4 MG/2ML IJ SOLN
INTRAMUSCULAR | Status: DC | PRN
  Administered 2016-04-29: 4 mg via INTRAVENOUS

## 2016-04-29 SURGICAL SUPPLY — 66 items
ANCHOR JUGGERKNOT 2.9 (Anchor) ×2 IMPLANT
BANDAGE ACE 4X5 VEL STRL LF (GAUZE/BANDAGES/DRESSINGS) IMPLANT
BANDAGE ACE 6X5 VEL STRL LF (GAUZE/BANDAGES/DRESSINGS) IMPLANT
BANDAGE ESMARK 6X9 LF (GAUZE/BANDAGES/DRESSINGS) IMPLANT
BIT DRILL JUGRKNT W/NDL BIT2.9 (DRILL) ×1 IMPLANT
BNDG COHESIVE 6X5 TAN STRL LF (GAUZE/BANDAGES/DRESSINGS) IMPLANT
BNDG ESMARK 6X9 LF (GAUZE/BANDAGES/DRESSINGS)
BNDG GAUZE ELAST 4 BULKY (GAUZE/BANDAGES/DRESSINGS) ×2 IMPLANT
BRUSH SCRUB DISP (MISCELLANEOUS) ×2 IMPLANT
CLEANER TIP ELECTROSURG 2X2 (MISCELLANEOUS) ×2 IMPLANT
CONT SPEC 4OZ CLIKSEAL STRL BL (MISCELLANEOUS) ×2 IMPLANT
COVER SURGICAL LIGHT HANDLE (MISCELLANEOUS) ×2 IMPLANT
CUFF TOURNIQUET SINGLE 18IN (TOURNIQUET CUFF) IMPLANT
CUFF TOURNIQUET SINGLE 24IN (TOURNIQUET CUFF) IMPLANT
CUFF TOURNIQUET SINGLE 34IN LL (TOURNIQUET CUFF) IMPLANT
DRAPE C-ARM 42X72 X-RAY (DRAPES) ×2 IMPLANT
DRAPE C-ARMOR (DRAPES) ×2 IMPLANT
DRAPE OEC MINIVIEW 54X84 (DRAPES) IMPLANT
DRAPE U-SHAPE 47X51 STRL (DRAPES) ×2 IMPLANT
DRILL JUGGERKNOT W/NDL BIT 2.9 (DRILL) ×2
DRSG ADAPTIC 3X8 NADH LF (GAUZE/BANDAGES/DRESSINGS) IMPLANT
DRSG MEPILEX BORDER 4X8 (GAUZE/BANDAGES/DRESSINGS) ×2 IMPLANT
ELECT REM PT RETURN 9FT ADLT (ELECTROSURGICAL) ×2
ELECTRODE REM PT RTRN 9FT ADLT (ELECTROSURGICAL) ×1 IMPLANT
EVACUATOR 1/8 PVC DRAIN (DRAIN) IMPLANT
GAUZE SPONGE 4X4 12PLY STRL (GAUZE/BANDAGES/DRESSINGS) IMPLANT
GLOVE BIO SURGEON STRL SZ7.5 (GLOVE) ×2 IMPLANT
GLOVE BIO SURGEON STRL SZ8 (GLOVE) ×2 IMPLANT
GLOVE BIOGEL PI IND STRL 7.5 (GLOVE) ×1 IMPLANT
GLOVE BIOGEL PI IND STRL 8 (GLOVE) ×1 IMPLANT
GLOVE BIOGEL PI INDICATOR 7.5 (GLOVE) ×1
GLOVE BIOGEL PI INDICATOR 8 (GLOVE) ×1
GOWN STRL REUS W/ TWL LRG LVL3 (GOWN DISPOSABLE) ×2 IMPLANT
GOWN STRL REUS W/ TWL XL LVL3 (GOWN DISPOSABLE) ×1 IMPLANT
GOWN STRL REUS W/TWL LRG LVL3 (GOWN DISPOSABLE) ×2
GOWN STRL REUS W/TWL XL LVL3 (GOWN DISPOSABLE) ×1
KIT BASIN OR (CUSTOM PROCEDURE TRAY) ×2 IMPLANT
KIT ROOM TURNOVER OR (KITS) ×2 IMPLANT
MANIFOLD NEPTUNE II (INSTRUMENTS) ×2 IMPLANT
NEEDLE 22X1 1/2 (OR ONLY) (NEEDLE) ×2 IMPLANT
NS IRRIG 1000ML POUR BTL (IV SOLUTION) ×2 IMPLANT
PACK ORTHO EXTREMITY (CUSTOM PROCEDURE TRAY) ×2 IMPLANT
PAD ARMBOARD 7.5X6 YLW CONV (MISCELLANEOUS) ×4 IMPLANT
PADDING CAST COTTON 6X4 STRL (CAST SUPPLIES) IMPLANT
SLING ARM FOAM STRAP XLG (SOFTGOODS) ×2 IMPLANT
SPONGE LAP 18X18 X RAY DECT (DISPOSABLE) ×6 IMPLANT
SPONGE SCRUB IODOPHOR (GAUZE/BANDAGES/DRESSINGS) IMPLANT
STAPLER VISISTAT 35W (STAPLE) ×2 IMPLANT
STOCKINETTE IMPERVIOUS LG (DRAPES) ×2 IMPLANT
STRIP CLOSURE SKIN 1/2X4 (GAUZE/BANDAGES/DRESSINGS) IMPLANT
SUCTION FRAZIER HANDLE 10FR (MISCELLANEOUS) ×1
SUCTION TUBE FRAZIER 10FR DISP (MISCELLANEOUS) ×1 IMPLANT
SUT ETHILON 3 0 PS 1 (SUTURE) ×2 IMPLANT
SUT PDS AB 2-0 CT1 27 (SUTURE) IMPLANT
SUT PROLENE 5 0 PS 2 (SUTURE) ×2 IMPLANT
SUT VIC AB 0 CT1 27 (SUTURE) ×1
SUT VIC AB 0 CT1 27XBRD ANBCTR (SUTURE) ×1 IMPLANT
SUT VIC AB 2-0 CT1 27 (SUTURE) ×1
SUT VIC AB 2-0 CT1 TAPERPNT 27 (SUTURE) ×1 IMPLANT
SYR CONTROL 10ML LL (SYRINGE) IMPLANT
TOWEL OR 17X24 6PK STRL BLUE (TOWEL DISPOSABLE) ×2 IMPLANT
TOWEL OR 17X26 10 PK STRL BLUE (TOWEL DISPOSABLE) ×2 IMPLANT
TUBE CONNECTING 12X1/4 (SUCTIONS) ×2 IMPLANT
UNDERPAD 30X30 (UNDERPADS AND DIAPERS) ×2 IMPLANT
WATER STERILE IRR 1000ML POUR (IV SOLUTION) IMPLANT
YANKAUER SUCT BULB TIP NO VENT (SUCTIONS) ×4 IMPLANT

## 2016-04-29 NOTE — Transfer of Care (Signed)
Immediate Anesthesia Transfer of Care Note  Patient: Justin Bernard  Procedure(s) Performed: Procedure(s): HARDWARE REMOVAL (Right) EXCISION HETEROTOPIC BONE RIGHT HUMERUS (Right)  Patient Location: PACU  Anesthesia Type:GA combined with regional for post-op pain  Level of Consciousness: awake, alert , oriented and patient cooperative  Airway & Oxygen Therapy: Patient Spontanous Breathing and Patient connected to nasal cannula oxygen  Post-op Assessment: Report given to RN and Post -op Vital signs reviewed and stable  Post vital signs: Reviewed and stable  Last Vitals:  Vitals:   04/29/16 1241 04/29/16 1245  BP: 136/81 123/80  Pulse: 86 86  Resp: (!) 23 19  Temp:      Last Pain:  Vitals:   04/29/16 1245  TempSrc:   PainSc: 0-No pain      Patients Stated Pain Goal: 5 (04/29/16 1147)  Complications: No apparent anesthesia complications

## 2016-04-29 NOTE — Discharge Instructions (Signed)
Orthopaedic Trauma Service Discharge Instructions   General Discharge Instructions  WEIGHT BEARING STATUS: as tolerate right upper extremity   RANGE OF MOTION/ACTIVITY: no external rotation of R shoulder, passive and active internal rotation ok. Shoulder flexion and extension ok. Gentle abduction ok   Sling for comfort   Wound Care:      Daily wound care starting on 05/01/2016. See below  Discharge Wound Care Instructions  Do NOT apply any ointments, solutions or lotions to pin sites or surgical wounds.  These prevent needed drainage and even though solutions like hydrogen peroxide kill bacteria, they also damage cells lining the pin sites that help fight infection.  Applying lotions or ointments can keep the wounds moist and can cause them to breakdown and open up as well. This can increase the risk for infection. When in doubt call the office.  Surgical incisions should be dressed daily.  If any drainage is noted, use one layer of adaptic, then gauze, Kerlix, and an ace wrap.  Once the incision is completely dry and without drainage, it may be left open to air out.  Showering may begin 36-48 hours later.  Cleaning gently with soap and water.  Traumatic wounds should be dressed daily as well.    One layer of adaptic, gauze, Kerlix, then ace wrap.  The adaptic can be discontinued once the draining has ceased    If you have a wet to dry dressing: wet the gauze with saline the squeeze as much saline out so the gauze is moist (not soaking wet), place moistened gauze over wound, then place a dry gauze over the moist one, followed by Kerlix wrap, then ace wrap.  PAIN MEDICATION USE AND EXPECTATIONS  You have likely been given narcotic medications to help control your pain.  After a traumatic event that results in an fracture (broken bone) with or without surgery, it is ok to use narcotic pain medications to help control one's pain.  We understand that everyone responds to pain differently and  each individual patient will be evaluated on a regular basis for the continued need for narcotic medications. Ideally, narcotic medication use should last no more than 6-8 weeks (coinciding with fracture healing).   As a patient it is your responsibility as well to monitor narcotic medication use and report the amount and frequency you use these medications when you come to your office visit.   We would also advise that if you are using narcotic medications, you should take a dose prior to therapy to maximize you participation.  IF YOU ARE ON NARCOTIC MEDICATIONS IT IS NOT PERMISSIBLE TO OPERATE A MOTOR VEHICLE (MOTORCYCLE/CAR/TRUCK/MOPED) OR HEAVY MACHINERY DO NOT MIX NARCOTICS WITH OTHER CNS (CENTRAL NERVOUS SYSTEM) DEPRESSANTS SUCH AS ALCOHOL  Diet: as you were eating previously.  Can use over the counter stool softeners and bowel preparations, such as Miralax, to help with bowel movements.  Narcotics can be constipating.  Be sure to drink plenty of fluids    STOP SMOKING OR USING NICOTINE PRODUCTS!!!!  As discussed nicotine severely impairs your body's ability to heal surgical and traumatic wounds but also impairs bone healing.  Wounds and bone heal by forming microscopic blood vessels (angiogenesis) and nicotine is a vasoconstrictor (essentially, shrinks blood vessels).  Therefore, if vasoconstriction occurs to these microscopic blood vessels they essentially disappear and are unable to deliver necessary nutrients to the healing tissue.  This is one modifiable factor that you can do to dramatically increase your chances of healing your injury.    (  This means no smoking, no nicotine gum, patches, etc)  DO NOT USE NONSTEROIDAL ANTI-INFLAMMATORY DRUGS (NSAID'S)  Using products such as Advil (ibuprofen), Aleve (naproxen), Motrin (ibuprofen) for additional pain control during fracture healing can delay and/or prevent the healing response.  If you would like to take over the counter (OTC) medication,  Tylenol (acetaminophen) is ok.  However, some narcotic medications that are given for pain control contain acetaminophen as well. Therefore, you should not exceed more than 4000 mg of tylenol in a day if you do not have liver disease.  Also note that there are may OTC medicines, such as cold medicines and allergy medicines that my contain tylenol as well.  If you have any questions about medications and/or interactions please ask your doctor/PA or your pharmacist.      ICE AND ELEVATE INJURED/OPERATIVE EXTREMITY  Using ice and elevating the injured extremity above your heart can help with swelling and pain control.  Icing in a pulsatile fashion, such as 20 minutes on and 20 minutes off, can be followed.    Do not place ice directly on skin. Make sure there is a barrier between to skin and the ice pack.    Using frozen items such as frozen peas works well as the conform nicely to the are that needs to be iced.  USE AN ACE WRAP OR TED HOSE FOR SWELLING CONTROL  In addition to icing and elevation, Ace wraps or TED hose are used to help limit and resolve swelling.  It is recommended to use Ace wraps or TED hose until you are informed to stop.    When using Ace Wraps start the wrapping distally (farthest away from the body) and wrap proximally (closer to the body)   Example: If you had surgery on your leg or thing and you do not have a splint on, start the ace wrap at the toes and work your way up to the thigh        If you had surgery on your upper extremity and do not have a splint on, start the ace wrap at your fingers and work your way up to the upper arm  IF YOU ARE IN A SPLINT OR CAST DO NOT [place]   If your splint gets wet for any reason please contact the office immediately. You may shower in your splint or cast as long as you keep it dry.  This can be done by wrapping in a cast cover or garbage back (or similar)  Do Not stick any thing down your splint or cast such as pencils,  money, or hangers to try and scratch yourself with.  If you feel itchy take benadryl as prescribed on the bottle for itching  IF YOU ARE IN A CAM BOOT (BLACK BOOT)  You may remove boot periodically. Perform daily dressing changes as noted below.  Wash the liner of the boot regularly and wear a sock when wearing the boot. It is recommended that you sleep in the boot until told otherwise  CALL THE OFFICE WITH ANY QUESTIONS OR CONCERNS: [phone]

## 2016-04-29 NOTE — Anesthesia Procedure Notes (Addendum)
Anesthesia Regional Block:  Interscalene brachial plexus block  Pre-Anesthetic Checklist: ,, timeout performed, Correct Patient, Correct Site, Correct Laterality, Correct Procedure, Correct Position, site marked, Risks and benefits discussed, pre-op evaluation,  At surgeon's request and post-op pain management  Laterality: Right  Prep: Maximum Sterile Barrier Precautions used, chloraprep       Needles:  Injection technique: Single-shot  Needle Type: Echogenic Stimulator Needle     Needle Length: 5cm 5 cm Needle Gauge: 22 and 22 G    Additional Needles:  Procedures: ultrasound guided (picture in chart) and nerve stimulator Interscalene brachial plexus block  Nerve Stimulator or Paresthesia:  Response: Biceps response,   Additional Responses:   Narrative:  Start time: 04/29/2016 12:22 PM End time: 04/29/2016 12:32 PM Injection made incrementally with aspirations every 5 mL. Anesthesiologist: Gaynelle Adu  Additional Notes: 2% Lidocaine skin wheel.

## 2016-04-29 NOTE — Progress Notes (Signed)
Spoke with Aurelio Brash, [employer] Rep. & he is aware of the need for his presence; he ask that we call him 15 mins. Before we need him.

## 2016-04-29 NOTE — H&P (Signed)
Orthopaedic Trauma Service H&P/Consult     Chief Complaint: Infected shoulder implant, humeral nonunion right HPI: Justin Bernard is an 55 y.o. male.s/p ORIF 6 months ago with CT confirmation of union proximal humerus but large fragment malunion that protrudes into the axillary sheath with frequent neurological complaints. Elevated CRP at 13.8. H/o CHF with improving EF since 2011 and now greater than 50%, followed by Dr. Marca Ancona who has cleared him for surgery.  Severe pain to palpation.  Defibrillator needs to be turned off before surgery.   Past Medical History:  Diagnosis Date  . AICD (automatic cardioverter/defibrillator) present   . Asthma   . Chronic systolic heart failure (HCC)    NICM, previous heart transplant candidate with EF 10%  . Diabetes mellitus without complication [facility])     Past Surgical History:  Procedure Laterality Date  . BACK SURGERY    . ICD    . ORIF HUMERUS FRACTURE Right 07/20/2015   Procedure: OPEN REDUCTION INTERNAL FIXATION (ORIF) PROXIMAL HUMERUS FRACTURE;  Surgeon: Beverely Low, MD;  Location: [facility] OR;  Service: Orthopedics;  Laterality: Right;    Family History  Problem Relation Age of Onset  . Heart failure Mother   . Hypertension Mother   . CAD Father   . Hypertension Other   . CAD Other    Social History:  reports that he has never smoked. He has never used smokeless tobacco. He reports that he does not drink alcohol or use drugs.  Allergies:  Allergies  Allergen Reactions  . Penicillins Other (See Comments)    Possible childhood reaction    Medications Prior to Admission  Medication Sig Dispense Refill  . carvedilol (COREG) 6.25 MG tablet Take 1 tablet (6.25 mg total) by mouth 2 (two) times daily with a meal. 60 tablet 6  . furosemide (LASIX) 40 MG tablet Take 20-40 mg by mouth See admin instructions. 40 mg in the morning.   Take 20 mg AT 4 O'CLOCK    . glipiZIDE (GLUCOTROL XL) 5 MG 24 hr tablet Take 5 mg by mouth daily with  breakfast.     . methocarbamol (ROBAXIN) 500 MG tablet Take 500 mg by mouth every 6 (six) hours as needed.     . potassium chloride SA (KLOR-CON M20) 20 MEQ tablet Take 2 tabs in the AM and 1 tab in the PM (Patient taking differently: Take 20 mEq by mouth 2 (two) times daily. ) 90 tablet 6  . spironolactone (ALDACTONE) 25 MG tablet Take 1 tablet (25 mg total) by mouth daily. 90 tablet 3  . traMADol (ULTRAM) 50 MG tablet Take 50 mg by mouth as needed (at bedtime and before physical therapy).    Marland Kitchen glucose monitoring kit (FREESTYLE) monitoring kit 1 each by Does not apply route 4 (four) times daily - after meals and at bedtime. 1 month Diabetic Testing Supplies for QAC-QHS accuchecks. 1 each 1    Results for orders placed or performed during the hospital encounter of 04/29/16 (from the past 48 hour(s))  Glucose, capillary     Status: Abnormal   Collection Time: 04/29/16  9:51 AM  Result Value Ref Range   Glucose-Capillary 119 (H) 65 - 99 mg/dL   No results found.  ROS No recent fever, bleeding abnormalities, urologic dysfunction, GI problems, or weight gain. Blood pressure 126/87, pulse 86, temperature 98.2 F (36.8 C), temperature source Oral, resp. rate 18, height 6' 4.5" (1.943 m), weight 117 kg (258 lb), SpO2 97 %. 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 Removal of implant 2. Intraoperative cultures--hold preop abx 3. Excision of malunited segment  Altamese Hartford, MD [facility] [phone] (p)  04/29/2016, 10:12 AM

## 2016-04-29 NOTE — Progress Notes (Signed)
PCP- Malachy Moan [place] office, seen by Kandis Mannan cardiology - Shirlee Latch, was initially seen for cardiac needs in [place], [facility] surgery.

## 2016-04-29 NOTE — Anesthesia Preprocedure Evaluation (Addendum)
Anesthesia Evaluation  Patient identified by MRN, date of birth, ID band Patient awake    Reviewed: Allergy & Precautions, H&P , NPO status , Patient's Chart, lab work & pertinent test results, reviewed documented beta blocker date and time   Airway Mallampati: II  TM Distance: >3 FB Neck ROM: Full    Dental no notable dental hx. (+) Teeth Intact, Dental Advisory Given   Pulmonary asthma ,    Pulmonary exam normal breath sounds clear to auscultation       Cardiovascular hypertension, Pt. on medications and Pt. on home beta blockers +CHF  + pacemaker + Cardiac Defibrillator  Rhythm:Regular Rate:Normal     Neuro/Psych negative neurological ROS  negative psych ROS   GI/Hepatic negative GI ROS, Neg liver ROS,   Endo/Other  diabetes, Type 2, Oral Hypoglycemic Agents  Renal/GU negative Renal ROS  negative genitourinary   Musculoskeletal  (+) Arthritis , Osteoarthritis,    Abdominal   Peds  Hematology negative hematology ROS (+)   Anesthesia Other Findings   Reproductive/Obstetrics negative OB ROS                            Anesthesia Physical Anesthesia Plan  ASA: III  Anesthesia Plan: General and Regional   Post-op Pain Management: GA combined w/ Regional for post-op pain   Induction: Intravenous  Airway Management Planned: Oral ETT  Additional Equipment:   Intra-op Plan:   Post-operative Plan: Extubation in OR  Informed Consent: I have reviewed the patients History and Physical, chart, labs and discussed the procedure including the risks, benefits and alternatives for the proposed anesthesia with the patient or authorized representative who has indicated his/her understanding and acceptance.   Dental advisory given  Plan Discussed with: CRNA  Anesthesia Plan Comments:        Anesthesia Quick Evaluation

## 2016-04-29 NOTE — Progress Notes (Signed)
Orthopedic Tech Progress Note Patient Details:  Justin Bernard 29-Nov-1960 [id number]  Ortho Devices Type of Ortho Device: Arm sling Ortho Device/Splint Location: rue Ortho Device/Splint Interventions: Ordered, Application Applied new arm sling to replace old bloody arm sling.  Pt refused ohf.  Trinna Post 04/29/2016, 10:52 PM

## 2016-04-29 NOTE — Progress Notes (Signed)
Pharmacy note: ancef  55 yo male s/p OR and ancef ordered post surgery. He has a PCN allergy (reaction not specified). He received vancomycin at about 3pm pre-op and was ordered ancef as post-op  Plan -Due to to vancomycin given as pre-op will give vancomycin as post-op -Vancomycin 1gm IV x1 to be given 12 hours post op -please contact pharmacy with any concerns  Harland German, Pharm D 04/29/2016 8:51 PM

## 2016-04-29 NOTE — Anesthesia Procedure Notes (Signed)
Procedure Name: Intubation Date/Time: 04/29/2016 1:41 PM Performed by: Doyce Loose ANN Pre-anesthesia Checklist: Patient identified, Emergency Drugs available, Suction available and Patient being monitored Patient Re-evaluated:Patient Re-evaluated prior to inductionOxygen Delivery Method: Circle System Utilized Preoxygenation: Pre-oxygenation with 100% oxygen Intubation Type: IV induction Ventilation: Mask ventilation without difficulty and Oral airway inserted - appropriate to patient size Laryngoscope Size: Miller and 3 Grade View: Grade I Tube type: Oral Tube size: 7.5 mm Number of attempts: 1 Airway Equipment and Method: Stylet and Oral airway Placement Confirmation: ETT inserted through vocal cords under direct vision,  positive ETCO2 and breath sounds checked- equal and bilateral Secured at: 23 cm Tube secured with: Tape Dental Injury: Teeth and Oropharynx as per pre-operative assessment  Comments: Chipped front left incisor noted pre-op. Mucosa and dentition intact after DL as pre-op.

## 2016-04-29 NOTE — Progress Notes (Signed)
Patient complaining of 10/10 pain.  Dr. Sampson Goon notified and nurse received verbal report to give 50-100 mcg Fentanyl.  Will continue to monitor patient.

## 2016-04-29 NOTE — Progress Notes (Signed)
Call to Dr. Carola Frost re; antibiotic allergy scenario. New order given.

## 2016-04-29 NOTE — Progress Notes (Signed)
Call to Dr. Sampson Goon regarding device management . He request that the rep. For [employer] be called.

## 2016-04-29 NOTE — Brief Op Note (Signed)
04/29/2016  10:43 PM  PATIENT:  Justin Bernard  55 y.o. male  PRE-OPERATIVE DIAGNOSIS:   1. MALUNION RIGHT HUMERUS 2. SYMPTOMATIC HARDWARE RIGHT HUMERUS  POST-OPERATIVE DIAGNOSIS:   1. MALUNION RIGHT HUMERUS 2. SYMPTOMATIC HARDWARE RIGHT HUMERUS  PROCEDURE:  Procedure(s): 1. REPAIR MALUNION RIGHT HUMERUS 2. SYMPTOMATIC HARDWARE REMOVAL RIGHT HUMERUS  SURGEON:  Surgeon(s) and Role:    * Myrene Galas, MD - Primary  ASSISTANTS: Isaias Cowman, RNFA  ANESTHESIA:   general  EBL:  No intake/output data recorded.  BLOOD ADMINISTERED:none  DRAINS: none   LOCAL MEDICATIONS USED:  NONE  SPECIMEN:  Source of Specimen:  peri-implant tissue  DISPOSITION OF SPECIMEN:  micro  COUNTS:  YES  TOURNIQUET:  * No tourniquets in log *  DICTATION: .Other Dictation: Dictation Number complete  PLAN OF CARE: Overnight observation  PATIENT DISPOSITION:  PACU - hemodynamically stable.   Delay start of Pharmacological VTE agent (>24hrs) due to surgical blood loss or risk of bleeding: no

## 2016-04-30 LAB — CBC
HEMATOCRIT: 33.3 % — AB (ref 39.0–52.0)
HEMOGLOBIN: 11 g/dL — AB (ref 13.0–17.0)
MCH: 38.6 pg — ABNORMAL HIGH (ref 26.0–34.0)
MCHC: 33 g/dL (ref 30.0–36.0)
MCV: 116.8 fL — ABNORMAL HIGH (ref 78.0–100.0)
Platelets: 266 10*3/uL (ref 150–400)
RBC: 2.85 MIL/uL — AB (ref 4.22–5.81)
RDW: 12.9 % (ref 11.5–15.5)
WBC: 10.8 10*3/uL — AB (ref 4.0–10.5)

## 2016-04-30 LAB — HEMOGLOBIN A1C
Hgb A1c MFr Bld: 5 % (ref 4.8–5.6)
Mean Plasma Glucose: 97 mg/dL

## 2016-04-30 LAB — GLUCOSE, CAPILLARY: GLUCOSE-CAPILLARY: 150 mg/dL — AB (ref 65–99)

## 2016-04-30 NOTE — Progress Notes (Signed)
SPORTS MEDICINE AND JOINT REPLACEMENT  Justin Spurling, MD    Laurier Nancy, PA-C [address] [place], [place], [place]  [address]                             [phone]   PROGRESS NOTE  Subjective:  negative for Chest Pain  negative for Shortness of Breath  negative for Nausea/Vomiting   negative for Calf Pain  negative for Bowel Movement   Tolerating Diet: yes         Patient reports pain as 4 on 0-10 scale.    Objective: Vital signs in last 24 hours:   Patient Vitals for the past 24 hrs:  BP Temp Temp src Pulse Resp SpO2 Height Weight  04/30/16 0514 136/81 98 F (36.7 C) Oral 84 - 94 % - -  04/30/16 0018 131/76 100 F (37.8 C) Oral 93 - 95 % - -  04/29/16 2010 123/88 99.3 F (37.4 C) Oral (!) 110 - 95 % - -  04/29/16 1950 129/89 98.2 F (36.8 C) - (!) 103 (!) 24 95 % - -  04/29/16 1945 - - - 100 (!) 21 - - -  04/29/16 1930 (!) 132/92 - - (!) 102 (!) 23 98 % - -  04/29/16 1915 (!) 132/92 98.3 F (36.8 C) - 100 20 98 % - -  04/29/16 1900 (!) 123/92 - - 99 (!) 22 - - -  04/29/16 1845 126/86 - - 73 (!) 21 - - -  04/29/16 1830 (!) 121/95 - - 96 (!) 24 97 % - -  04/29/16 1815 124/78 - - 97 (!) 24 98 % - -  04/29/16 1805 132/87 - - 94 20 99 % - -  04/29/16 1800 - - - 96 20 99 % - -  04/29/16 1745 128/83 - - 95 (!) 22 99 % - -  04/29/16 1732 122/86 - - 92 17 98 % - -  04/29/16 1721 127/83 98.2 F (36.8 C) - 92 17 97 % - -  04/29/16 1245 123/80 - - 86 19 96 % - -  04/29/16 1241 136/81 - - 86 (!) 23 97 % - -  04/29/16 1240 - - - 85 17 98 % - -  04/29/16 1235 (!) 143/90 - - 83 20 98 % - -  04/29/16 1230 135/89 - - 86 19 97 % - -  04/29/16 1225 (!) 129/91 - - 84 16 98 % - -  04/29/16 1220 139/87 - - 85 12 97 % - -  04/29/16 1215 (!) 133/91 - - 85 - 99 % - -  04/29/16 0948 126/87 98.2 F (36.8 C) Oral 86 18 97 % 6' 4.5" (1.943 m) 117 kg (258 lb)    @flow {1959:LAST@   Intake/Output from previous day:   09/15 0701 - 09/16 0700 In: 2480 [P.O.:480; I.V.:2000] Out: 550  [Urine:300]   Intake/Output this shift:   No intake/output data recorded.   Intake/Output      09/15 0701 - 09/16 0700 09/16 0701 - 09/17 0700   P.O. 480    I.V. (mL/kg) 2000 (17.1)    Total Intake(mL/kg) 2480 (21.2)    Urine (mL/kg/hr) 300    Blood 250    Total Output 550     Net +1930          Urine Occurrence 1 x       LABORATORY DATA:  Recent Labs  04/29/16 0959  04/30/16 0249  WBC 7.8 10.8*  HGB 13.3 11.0*  HCT 38.3* 33.3*  PLT 296 266    Recent Labs  04/29/16 0959  NA 137  K 3.3*  CL 104  CO2 19*  BUN 11  CREATININE 0.84  GLUCOSE 119*  CALCIUM 9.3   Lab Results  Component Value Date   INR 1.07 04/29/2016    Examination:  General appearance: alert, cooperative and no distress Extremities: extremities normal, atraumatic, no cyanosis or edema  Wound Exam: clean, dry, intact   Drainage:  None: wound tissue dry  Motor Exam: Opposition, Pinch and Wrist Dorsiflexion Intact  Sensory Exam: Radial and Ulnar normal   Assessment:    1 Day Post-Op  Procedure(s) (LRB): HARDWARE REMOVAL (Right) EXCISION HETEROTOPIC BONE RIGHT HUMERUS (Right)  ADDITIONAL DIAGNOSIS:  Active Problems:   Painful orthopaedic hardware   S/P hardware removal  Acute Blood Loss Anemia   Plan: Occupational Therapy as ordered Weight Bearing as Tolerated (WBAT)  DVT Prophylaxis:  None  DISCHARGE PLAN: Home  DISCHARGE NEEDS: HHPT   Patient is doing great, will D/C today         Guy SandiferColby Alan Lai Hendriks 04/30/2016, 9:24 AM

## 2016-04-30 NOTE — Op Note (Signed)
NAMLeone Brand:  Bernard, Justin             ACCOUNT NO.:  [id number]  MEDICAL RECORD NO.:  [id number]  LOCATION:  5N03C                        FACILITY:  [facility]  PHYSICIAN:  Doralee AlbinoMichael H. Carola FrostHandy, M.D. DATE OF BIRTH:  04-07-1961  DATE OF PROCEDURE:  04/29/2016 DATE OF DISCHARGE:                              OPERATIVE REPORT   PREOPERATIVE DIAGNOSES: 1. Malunion of right humerus. 2. Symptomatic hardware, deep implant, right humerus.  POSTOPERATIVE DIAGNOSES: 1. Malunion of right humerus. 2. Symptomatic hardware, deep implant, right humerus.  PROCEDURES: 1. Repair of right humerus malunion with osteotomy. 2. Removal of deep implant, right proximal humerus.  SURGEON:  Doralee AlbinoMichael H. Carola FrostHandy, M.D.  ASSISTANT:  Isaias CowmanJenny Burgess, RNFA.  ANESTHESIA:  General.  COMPLICATIONS:  None.  ESTIMATED BLOOD LOSS:  200 mL.  SPECIMENS:  2 anaerobic, aerobic, deep tissue sent from the periimplant region, disposition to Micro.  DISPOSITION:  To PACU.  CONDITION:  Stable.  BRIEF SUMMARY AND INDICATIONS FOR PROCEDURE:  Justin Bernard is a 55- year-old male status post ORIF of a right proximal humerus fracture that went on to unite.  The patient had persistent neurologic symptoms in his arm and a large fragment of the medial cortex that continued to grow from the time of initial injury until several months postoperatively when this malunited fragment had expanded considerably all the way over into the axillary sheath.  Despite numerous attempts at diagnosis including EMG and NCV studies, the patient wished for excision of this fragment.  I saw the patient in consultation and did obtain sed rate and CRP, which was 8 and 13.8, with the latter being very concerning for infection.  Because of this, I did discuss with him the potential for occult infection and the need to hold the antibiotics preoperatively to obtain cultures and then to remove the hardware in addition.  The patient's tenderness was also very  disproportional to the heterotopic bone, and I felt that this was a very strong contributing factor.  We discussed heart attack, stroke, infection, nerve injury, vessel injury, need for further surgery, fracture, failure to alleviate symptoms, and the very real and pronounced risk of vein, nerve, and vessel injury, particularly given the extension in the sheath.  After all of these factors were discussed, the patient strongly wished to proceed.  BRIEF SUMMARY OF PROCEDURE:  The patient was taken to the operating room, where general anesthesia was induced.  His right upper extremity was prepped and draped in usual sterile fashion.  The vancomycin was held, but was given after obtaining the cultures.  The old surgical incision was re-made and extended distally by 3-4 cm.  I was able to identify the cephalic vein, which was very large and mobilized that laterally with the deltoid.  I did repair a branch that was right off the cephalic with 5-0 Prolene with good effect and no subsequent problems.  The deltopectoral interval was identified and developed. This approach was made while holding the patient's shoulder abducted and externally rotated to relax the musculatures and facilitate development of this interval.  There was rather substantial bony overgrowth around the proximal humerus, and this continued up to the head of the implant and over it.  The screws  were largely covered in bone and a small osteotome and curette had to be used to expose the head.  The screws were withdrawn without difficulty distally.  The screws were withdrawn from the plate proximally.  However, at that point, it was very difficult to remove the device, and an osteotome had to be used to remove the distal lip underneath the plate portion and then over the most proximal lip and posteriorly down the bend of the [name].  Because of its geometry, again considerable time and effort was expanded in  mobilizing this.  I sent both the bone and fibrinous tissue from around this implant proximally and distally for anaerobic and aerobic cultures.  As soon as these cultures were obtained, I then asked the anesthesiologist to administer the vancomycin, which was done. Ultimately, I was able to remove the plate.  I did not identify any purulence at any time.  This area was irrigated thoroughly and then attention turned distally.  I traced the pect to its distal edge and brought in C-arm, confirmed the appropriate distal edge of the malunited segment of humerus and the heterotopic bone that had further developed medially from this fragment.  Going underneath the fragment, I was able to expose and elevate it without difficulty on the anterior side, where as the posterior side was more challenging in a stepwise fashion with the help of my assistant, who controlled and rotated the arm as well as provided retraction and protected the cephalic vein and the medial neurovascular structures.  We were able to make our way in a stepwise fashion.  The fragment was so large that I was unable to completely remove it without further proximally extension and elevation of the pectoralis until this was performed.  I continued it all the way up, say that only about 30% of the attachment remained.  This, however, greatly improved my visualization and enabled safe removal.  The fragment extended all the way into the edge of the axillary sheath anteriorly. It was clearly able to windshield wiper into the sheath because of neurologic stimulation with the Bovie directly on the bone and the potential for site bleeding with the elevator into the vein.  I had to use a #15 blade under direct visualization, which was again very technically challenging, but nonetheless, successful proceeding in a methodical stepwise fashion.  Once this fragment was completely withdrawn, I was able to identify further fragments posteriorly  and removed these as well also under direct visualization.  The area was irrigated thoroughly.  I did spray some fibrin spray in this area, and the osteotome was passed directly along the cortex of the proximal humerus under direct visualization to reduce the chance of neurovascular injury.  It was irrigated thoroughly and then closed in standard layered fashion after first repairing the pec insertion using #2 FiberWires placed into a bony anchor.  The bone excised was then used to graft the hole created by extraction of the device in the proximal humerus, and this gave me excellent fit and fill.  The total quantity of bone removed at least in terms of square area was well over 25 cm.  Skin was reapproximated with 3-0 horizontal mattress sutures because of the previous incision and the deep with 0 Vicryl, then 2-0 Vicryl prior to applying that nylon.  Isaias Cowman, RNFA, again assisted me during the procedure and it was absolutely necessary for the safe and effective completion.  PROGNOSIS:  The patient will be in a sling  for comfort.  He will have restrictions against passive external rotation beyond neutral and active internal rotation against resistance.  I will plan to see him back in the office in 10-14 days, and he will come in overnight for pain control and observation with aggressive ice and elevation.  We will follow up his cultures as well, but at this time, I do not suspect infection rather acute inflammation.     Doralee Albino. Carola Frost, M.D.     [facility]/MED[facility]  D:  04/29/2016  T:  04/30/2016  Job:  [id number]

## 2016-04-30 NOTE — Evaluation (Signed)
Occupational Therapy Evaluation/Discharge Patient Details Name: Justin Bernard MRN: [id number] DOB: Dec 18, 1960 Today's Date: 04/30/2016    History of Present Illness 55 y.o. male s/p removal of hardware and repair of R humerus malunion with osteotomy. PMH significant for AICD, asthma, CHF, DM type II, back surgery, and ORIF humerus fx (2016).   Clinical Impression   PTA, pt was independent with ADLs and mobility. Pt currently requires min assist for UB bathing and dressing and supervision for basic transfers. Educated pt on NWB status of RUE, sling wear protocol, compensatory strategies for UB ADLs, RUE HEP, and fall prevention strategies. Pt demonstrated understanding of all education, but required cues to avoid reaching for items with RUE and to keep it supported in the sling. All education has been completed and pt has no further questions. Pt with no further acute OT needs. OT signing off.     Follow Up Recommendations  No OT follow up;Supervision - Intermittent    Equipment Recommendations  None recommended by OT    Recommendations for Other Services       Precautions / Restrictions Precautions Precautions: Shoulder Shoulder Interventions: Shoulder sling/immobilizer;Off for dressing/bathing/exercises Precaution Booklet Issued: Yes (comment) Precaution Comments: Gentle FF, EXT, ABD, and IR allowed; NO shoulder ER allowed; unrestricted elbow/wrist/hand movement Required Braces or Orthoses: Sling Restrictions Weight Bearing Restrictions: Yes RUE Weight Bearing: Non weight bearing      Mobility Bed Mobility               General bed mobility comments: Pt up in chair on OT arrival  Transfers Overall transfer level: Needs assistance Equipment used: None Transfers: Sit to/from Stand Sit to Stand: Supervision         General transfer comment: Supervision for safety. Cues for avoid moving R shoulder and attempting to reach for objects.    Balance Overall balance  assessment: Needs assistance Sitting-balance support: No upper extremity supported;Feet supported Sitting balance-Leahy Scale: Good     Standing balance support: No upper extremity supported;During functional activity Standing balance-Leahy Scale: Good                              ADL Overall ADL's : Needs assistance/impaired         Upper Body Bathing: Minimal assitance;Sitting   Lower Body Bathing: Supervison/ safety;Sit to/from stand   Upper Body Dressing : Minimal assistance;Sitting   Lower Body Dressing: Supervision/safety;Sit to/from stand   Toilet Transfer: Retail bankerupervision/safety;Regular Toilet;Ambulation   Toileting- Clothing Manipulation and Hygiene: Supervision/safety;Sit to/from stand       Functional mobility during ADLs: Supervision/safety General ADL Comments: Educated on shoulder precautions, sling wear protocol, positioning for rest/sleep, compensatory strategies for ADLs and RUE HEP.     Vision Vision Assessment?: No apparent visual deficits   Perception     Praxis      Pertinent Vitals/Pain Pain Assessment: No/denies pain     Hand Dominance Right   Extremity/Trunk Assessment Upper Extremity Assessment Upper Extremity Assessment: RUE deficits/detail RUE Deficits / Details: decreased ROM and strength as expected post op RUE Coordination: decreased gross motor   Lower Extremity Assessment Lower Extremity Assessment: Overall WFL for tasks assessed   Cervical / Trunk Assessment Cervical / Trunk Assessment: Normal   Communication Communication Communication: No difficulties   Cognition Arousal/Alertness: Awake/alert Behavior During Therapy: WFL for tasks assessed/performed Overall Cognitive Status: Within Functional Limits for tasks assessed  General Comments       Exercises Exercises: Shoulder     Shoulder Instructions Shoulder Instructions Donning/doffing shirt without moving shoulder: Minimal  assistance Method for sponge bathing under operated UE: Minimal assistance Donning/doffing sling/immobilizer: Independent Correct positioning of sling/immobilizer: Independent ROM for elbow, wrist and digits of operated UE: Independent Sling wearing schedule (on at all times/off for ADL's): Independent Proper positioning of operated UE when showering: Independent Positioning of UE while sleeping: Independent    Home Living Family/patient expects to be discharged to:: Private residence Living Arrangements: Children Available Help at Discharge: Family;Available 24 hours/day Type of Home: House Home Access: Stairs to enter [employer] of Steps: 4 Entrance Stairs-Rails: None Home Layout: One level     Bathroom Shower/Tub: Producer, television/film/video: Standard     Home Equipment: Cane - single point;Wheelchair - manual;Shower seat;Hand held shower head;Adaptive equipment Adaptive Equipment: Long-handled sponge        Prior Functioning/Environment Level of Independence: Independent                 OT Problem List: Decreased strength;Decreased range of motion;Impaired balance (sitting and/or standing);Decreased safety awareness;Pain;Impaired UE functional use   OT Treatment/Interventions:      OT Goals(Current goals can be found in the care plan section) Acute Rehab OT Goals Patient Stated Goal: to go home OT Goal Formulation: With patient Time For Goal Achievement: 05/14/16 Potential to Achieve Goals: Good  OT Frequency:     Barriers to D/C:            Co-evaluation              End of Session Equipment Utilized During Treatment: Other (comment) (sling) Nurse Communication: Mobility status  Activity Tolerance: Patient tolerated treatment well Patient left: in chair;with call bell/phone within reach   Time: 5188-4166 OT Time Calculation (min): 19 min Charges:  OT General Charges $OT Visit: 1 Procedure OT Evaluation $OT Eval Low  Complexity: 1 Procedure G-Codes:    Nils Pyle, OTR/L Pager: [phone] 04/30/2016, 10:40 AM

## 2016-04-30 NOTE — Discharge Summary (Signed)
SPORTS MEDICINE & JOINT REPLACEMENT   Lara Mulch, MD   Carlyon Shadow, PA-C [address], [place], [place]  [address]                             [phone]  PATIENT ID: Justin Bernard        MRN:  [id number]          DOB/AGE: Jan 27, 1961 / 55 y.o.    DISCHARGE SUMMARY  ADMISSION DATE:    04/29/2016 DISCHARGE DATE:   04/30/2016   ADMISSION DIAGNOSIS: SYMPTOMATIC HARDWARE RIGHT HUMEROUS, HETEROTOPIC BONE RIGHT HUMEROUS    DISCHARGE DIAGNOSIS:  SYMPTOMATIC HARDWARE RIGHT HUMEROUS, HETEROTOPIC BONE RIGHT HUMEROUS    ADDITIONAL DIAGNOSIS: Active Problems:   Painful orthopaedic hardware   S/P hardware removal  Past Medical History:  Diagnosis Date  . AICD (automatic cardioverter/defibrillator) present   . Arthritis    L hip   . Asthma    pt. uses albuterol inhaler in the spring for enviromental allergies   . CHF (congestive heart failure) ([place])   . Chronic systolic heart failure (HCC)    NICM, previous heart transplant candidate with EF 10%  . Diabetes mellitus without complication ([place])   . Family history of adverse reaction to anesthesia    sister- headache & N&V  . History of hiatal hernia   . Hypertension   . Kidney stones 1997   passed spontaneously   . Neuromuscular disorder ([place])    essential tremor  . Pneumonia 2013   hosp. - IV antibiotics   . Presence of permanent cardiac pacemaker 2012   [employer]  . Shortness of breath dyspnea     PROCEDURE: Procedure(s): HARDWARE REMOVAL EXCISION HETEROTOPIC BONE RIGHT HUMERUS on 04/29/2016  CONSULTS:    HISTORY:  See H&P in chart  HOSPITAL COURSE:  Justin Bernard is a 55 y.o. admitted on 04/29/2016 and found to have a diagnosis of [place], HETEROTOPIC BONE RIGHT HUMEROUS.  After appropriate laboratory studies were obtained  they were taken to the operating room on 04/29/2016 and underwent Procedure(s): [place].   They were  given perioperative antibiotics:  Anti-infectives    Start     Dose/Rate Route Frequency Ordered Stop   04/30/16 0300  vancomycin (VANCOCIN) IVPB 1000 mg/200 mL premix     1,000 mg 200 mL/hr over 60 Minutes Intravenous  Once 04/29/16 2048 04/30/16 0543   04/29/16 2030  ceFAZolin (ANCEF) IVPB 1 g/50 mL premix  Status:  Discontinued     1 g 100 mL/hr over 30 Minutes Intravenous Every 6 hours 04/29/16 2019 04/29/16 2046   04/29/16 1045  vancomycin (VANCOCIN) 1,500 mg in sodium chloride 0.9 % 500 mL IVPB     1,500 mg 250 mL/hr over 120 Minutes Intravenous  Once 04/29/16 1044 04/29/16 1620   04/29/16 1000  ceFAZolin (ANCEF) IVPB 2g/100 mL premix  Status:  Discontinued     2 g 200 mL/hr over 30 Minutes Intravenous On call to O.R. 04/29/16 7341 04/29/16 2005    .  Patient given tranexamic acid IV or topical and exparel intra-operatively.  Tolerated the procedure well.    POD# 1: Vital signs were stable.  Patient denied Chest pain, shortness of breath, or calf pain.  Patient was started on Lovenox 30 mg subcutaneously twice daily at 8am.  Consults to PT, OT, and care management were made.  The patient was weight bearing  as tolerated.  CPM was placed on the operative leg 0-90 degrees for 6-8 hours a day. When out of the CPM, patient was placed in the foam block to achieve full extension. Incentive spirometry was taught.  Dressing was changed.       POD #2, Continued  PT for ambulation and exercise program.  IV saline locked.  O2 discontinued.    The remainder of the hospital course was dedicated to ambulation and strengthening.   The patient was discharged on 1 Day Post-Op in  Good condition.  Blood products given:none  DIAGNOSTIC STUDIES: Recent vital signs: Patient Vitals for the past 24 hrs:  BP Temp Temp src Pulse Resp SpO2  04/30/16 0514 136/81 98 F (36.7 C) Oral 84 - 94 %  04/30/16 0018 131/76 100 F (37.8 C) Oral 93 - 95 %  04/29/16 2010 123/88 99.3 F (37.4 C) Oral (!) 110 -  95 %  04/29/16 1950 129/89 98.2 F (36.8 C) - (!) 103 (!) 24 95 %  04/29/16 1945 - - - 100 (!) 21 -  04/29/16 1930 (!) 132/92 - - (!) 102 (!) 23 98 %  04/29/16 1915 (!) 132/92 98.3 F (36.8 C) - 100 20 98 %  04/29/16 1900 (!) 123/92 - - 99 (!) 22 -  04/29/16 1845 126/86 - - 73 (!) 21 -  04/29/16 1830 (!) 121/95 - - 96 (!) 24 97 %  04/29/16 1815 124/78 - - 97 (!) 24 98 %  04/29/16 1805 132/87 - - 94 20 99 %  04/29/16 1800 - - - 96 20 99 %  04/29/16 1745 128/83 - - 95 (!) 22 99 %  04/29/16 1732 122/86 - - 92 17 98 %  04/29/16 1721 127/83 98.2 F (36.8 C) - 92 17 97 %  04/29/16 1245 123/80 - - 86 19 96 %  04/29/16 1241 136/81 - - 86 (!) 23 97 %  04/29/16 1240 - - - 85 17 98 %  04/29/16 1235 (!) 143/90 - - 83 20 98 %  04/29/16 1230 135/89 - - 86 19 97 %  04/29/16 1225 (!) 129/91 - - 84 16 98 %  04/29/16 1220 139/87 - - 85 12 97 %  04/29/16 1215 (!) 133/91 - - 85 - 99 %       Recent laboratory studies:  Recent Labs  04/29/16 0959 04/30/16 0249  WBC 7.8 10.8*  HGB 13.3 11.0*  HCT 38.3* 33.3*  PLT 296 266    Recent Labs  04/29/16 0959  NA 137  K 3.3*  CL 104  CO2 19*  BUN 11  CREATININE 0.84  GLUCOSE 119*  CALCIUM 9.3   Lab Results  Component Value Date   INR 1.07 04/29/2016     Recent Radiographic Studies :  Dg Shoulder Right Port  Result Date: 04/29/2016 CLINICAL DATA:  Postop heterotopic ossification right shoulder EXAM: PORTABLE RIGHT SHOULDER COMPARISON:  Intraoperative film same day FINDINGS: Single portable view of the right shoulder submitted. The patient is status post hardware removal right shoulder. Hypertrophic calcification adjacent to proximal humerus again noted. Mild degenerative changes glenohumeral joint. There is anatomic alignment. IMPRESSION: The patient is status post hardware removal right shoulder. Hypertrophic calcification adjacent to proximal humerus again noted. Mild degenerative changes glenohumeral joint. There is anatomic alignment.  Electronically Signed   By: Lahoma Crocker M.D.   On: 04/29/2016 18:05   Dg Humerus Right  Result Date: 04/29/2016 CLINICAL DATA:  Hardware excision and removal of  bony growth at proximal RIGHT humerus EXAM: RIGHT HUMERUS - 2+ VIEW; DG C-ARM 61-120 MIN COMPARISON:  CT RIGHT shoulder 03/25/2016 FLUOROSCOPY TIME:  0 minutes 39.9 seconds Images obtained:  5 FINDINGS: Initial images demonstrate a plate and multiple screws at the proximal RIGHT humerus post ORIF. Plate and screws were removed. Bones demineralized. Bony excrescence arising from the proximal shaft of the RIGHT humerus posterior medially on prior CT was excised. No new fracture seen. Shoulder joint alignment appears normal. IMPRESSION: Post removal of orthopedic hardware proximal RIGHT humerus. Post excision of bony excrescence at proximal RIGHT humeral diaphysis. Electronically Signed   By: Ulyses Southward M.D.   On: 04/29/2016 16:47   Dg C-arm 61-120 Min  Result Date: 04/29/2016 CLINICAL DATA:  Hardware excision and removal of bony growth at proximal RIGHT humerus EXAM: RIGHT HUMERUS - 2+ VIEW; DG C-ARM 61-120 MIN COMPARISON:  CT RIGHT shoulder 03/25/2016 FLUOROSCOPY TIME:  0 minutes 39.9 seconds Images obtained:  5 FINDINGS: Initial images demonstrate a plate and multiple screws at the proximal RIGHT humerus post ORIF. Plate and screws were removed. Bones demineralized. Bony excrescence arising from the proximal shaft of the RIGHT humerus posterior medially on prior CT was excised. No new fracture seen. Shoulder joint alignment appears normal. IMPRESSION: Post removal of orthopedic hardware proximal RIGHT humerus. Post excision of bony excrescence at proximal RIGHT humeral diaphysis. Electronically Signed   By: Ulyses Southward M.D.   On: 04/29/2016 16:47    DISCHARGE INSTRUCTIONS: Discharge Instructions    Call MD / Call 911    Complete by:  As directed    If you experience chest pain or shortness of breath, CALL 911 and be transported to the hospital  emergency room.  If you develope a fever above 101 F, pus (white drainage) or increased drainage or redness at the wound, or calf pain, call your surgeon's office.   Call MD / Call 911    Complete by:  As directed    If you experience chest pain or shortness of breath, CALL 911 and be transported to the hospital emergency room.  If you develope a fever above 101 F, pus (white drainage) or increased drainage or redness at the wound, or calf pain, call your surgeon's office.   Constipation Prevention    Complete by:  As directed    Drink plenty of fluids.  Prune juice may be helpful.  You may use a stool softener, such as Colace (over the counter) 100 mg twice a day.  Use MiraLax (over the counter) for constipation as needed.   Constipation Prevention    Complete by:  As directed    Drink plenty of fluids.  Prune juice may be helpful.  You may use a stool softener, such as Colace (over the counter) 100 mg twice a day.  Use MiraLax (over the counter) for constipation as needed.   Diet - low sodium heart healthy    Complete by:  As directed    Diet - low sodium heart healthy    Complete by:  As directed    Discharge instructions    Complete by:  As directed    Orthopaedic Trauma Service Discharge Instructions   General Discharge Instructions  WEIGHT BEARING STATUS: as tolerate right upper extremity   RANGE OF MOTION/ACTIVITY: no external rotation of R shoulder, passive and active internal rotation ok. Shoulder flexion and extension ok. Gentle abduction ok   Wound Care:      Daily wound care starting on  05/01/2016. See below  Discharge Wound Care Instructions  Do NOT apply any ointments, solutions or lotions to pin sites or surgical wounds.  These prevent needed drainage and even though solutions like hydrogen peroxide kill bacteria, they also damage cells lining the pin sites that help fight infection.  Applying lotions or ointments can keep the wounds moist and can cause them to breakdown  and open up as well. This can increase the risk for infection. When in doubt call the office.  Surgical incisions should be dressed daily.  If any drainage is noted, use one layer of adaptic, then gauze, Kerlix, and an ace wrap.  Once the incision is completely dry and without drainage, it may be left open to air out.  Showering may begin 36-48 hours later.  Cleaning gently with soap and water.  Traumatic wounds should be dressed daily as well.    One layer of adaptic, gauze, Kerlix, then ace wrap.  The adaptic can be discontinued once the draining has ceased    If you have a wet to dry dressing: wet the gauze with saline the squeeze as much saline out so the gauze is moist (not soaking wet), place moistened gauze over wound, then place a dry gauze over the moist one, followed by Kerlix wrap, then ace wrap.  PAIN MEDICATION USE AND EXPECTATIONS  You have likely been given narcotic medications to help control your pain.  After a traumatic event that results in an fracture (broken bone) with or without surgery, it is ok to use narcotic pain medications to help control one's pain.  We understand that everyone responds to pain differently and each individual patient will be evaluated on a regular basis for the continued need for narcotic medications. Ideally, narcotic medication use should last no more than 6-8 weeks (coinciding with fracture healing).   As a patient it is your responsibility as well to monitor narcotic medication use and report the amount and frequency you use these medications when you come to your office visit.   We would also advise that if you are using narcotic medications, you should take a dose prior to therapy to maximize you participation.  IF YOU ARE ON NARCOTIC MEDICATIONS IT IS NOT PERMISSIBLE TO OPERATE A MOTOR VEHICLE (MOTORCYCLE/CAR/TRUCK/MOPED) OR HEAVY MACHINERY DO NOT MIX NARCOTICS WITH OTHER CNS (CENTRAL NERVOUS SYSTEM) DEPRESSANTS SUCH AS ALCOHOL  Diet: as you  were eating previously.  Can use over the counter stool softeners and bowel preparations, such as Miralax, to help with bowel movements.  Narcotics can be constipating.  Be sure to drink plenty of fluids    STOP SMOKING OR USING NICOTINE PRODUCTS!!!!  As discussed nicotine severely impairs your body's ability to heal surgical and traumatic wounds but also impairs bone healing.  Wounds and bone heal by forming microscopic blood vessels (angiogenesis) and nicotine is a vasoconstrictor (essentially, shrinks blood vessels).  Therefore, if vasoconstriction occurs to these microscopic blood vessels they essentially disappear and are unable to deliver necessary nutrients to the healing tissue.  This is one modifiable factor that you can do to dramatically increase your chances of healing your injury.    (This means no smoking, no nicotine gum, patches, etc)  DO NOT USE NONSTEROIDAL ANTI-INFLAMMATORY DRUGS (NSAID'S)  Using products such as Advil (ibuprofen), Aleve (naproxen), Motrin (ibuprofen) for additional pain control during fracture healing can delay and/or prevent the healing response.  If you would like to take over the counter (OTC) medication, Tylenol (acetaminophen) is ok.  However, some narcotic medications that are given for pain control contain acetaminophen as well. Therefore, you should not exceed more than 4000 mg of tylenol in a day if you do not have liver disease.  Also note that there are may OTC medicines, such as cold medicines and allergy medicines that my contain tylenol as well.  If you have any questions about medications and/or interactions please ask your doctor/PA or your pharmacist.      ICE AND ELEVATE INJURED/OPERATIVE EXTREMITY  Using ice and elevating the injured extremity above your heart can help with swelling and pain control.  Icing in a pulsatile fashion, such as 20 minutes on and 20 minutes off, can be followed.    Do not place ice directly on skin. Make sure there is a  barrier between to skin and the ice pack.    Using frozen items such as frozen peas works well as the conform nicely to the are that needs to be iced.  USE AN ACE WRAP OR TED HOSE FOR SWELLING CONTROL  In addition to icing and elevation, Ace wraps or TED hose are used to help limit and resolve swelling.  It is recommended to use Ace wraps or TED hose until you are informed to stop.    When using Ace Wraps start the wrapping distally (farthest away from the body) and wrap proximally (closer to the body)   Example: If you had surgery on your leg or thing and you do not have a splint on, start the ace wrap at the toes and work your way up to the thigh        If you had surgery on your upper extremity and do not have a splint on, start the ace wrap at your fingers and work your way up to the upper arm  IF YOU ARE IN A SPLINT OR CAST DO NOT [place]   If your splint gets wet for any reason please contact the office immediately. You may shower in your splint or cast as long as you keep it dry.  This can be done by wrapping in a cast cover or garbage back (or similar)  Do Not stick any thing down your splint or cast such as pencils, money, or hangers to try and scratch yourself with.  If you feel itchy take benadryl as prescribed on the bottle for itching  IF YOU ARE IN A CAM BOOT (BLACK BOOT)  You may remove boot periodically. Perform daily dressing changes as noted below.  Wash the liner of the boot regularly and wear a sock when wearing the boot. It is recommended that you sleep in the boot until told otherwise  CALL THE OFFICE WITH ANY QUESTIONS OR CONCERNS: [phone]   Discharge instructions    Complete by:  As directed    Keep wound clean/dry  Change dressing daily  Take pain medication as prescribed  Follow up with Dr Marcelino Scot   Increase activity slowly as tolerated    Complete by:  As directed    Increase activity slowly as tolerated    Complete by:  As directed        DISCHARGE MEDICATIONS:     Medication List    TAKE these medications   carvedilol 6.25 MG tablet Commonly known as:  COREG Take 1 tablet (6.25 mg total) by mouth 2 (two) times daily with a meal.   furosemide 40 MG tablet Commonly known as:  LASIX Take 20-40 mg by mouth  See admin instructions. 40 mg in the morning.   Take 20 mg AT 4 O'CLOCK   glipiZIDE 5 MG 24 hr tablet Commonly known as:  GLUCOTROL XL Take 5 mg by mouth daily with breakfast.   glucose monitoring kit monitoring kit 1 each by Does not apply route 4 (four) times daily - after meals and at bedtime. 1 month Diabetic Testing Supplies for QAC-QHS accuchecks.   HYDROcodone-acetaminophen 7.5-325 MG tablet Commonly known as:  NORCO Take 1-2 tablets by mouth every 6 (six) hours as needed for moderate pain or severe pain.   ketorolac 10 MG tablet Commonly known as:  TORADOL Take 1 tablet (10 mg total) by mouth every 6 (six) hours as needed for moderate pain.   methocarbamol 500 MG tablet Commonly known as:  ROBAXIN Take 500 mg by mouth every 6 (six) hours as needed.   potassium chloride SA 20 MEQ tablet Commonly known as:  KLOR-CON M20 Take 2 tabs in the AM and 1 tab in the PM What changed:  how much to take  how to take this  when to take this  additional instructions   spironolactone 25 MG tablet Commonly known as:  ALDACTONE Take 1 tablet (25 mg total) by mouth daily.   traMADol 50 MG tablet Commonly known as:  ULTRAM Take 50 mg by mouth as needed (at bedtime and before physical therapy).       FOLLOW UP VISIT:   Follow-up Information    HANDY,MICHAEL H, MD In 2 weeks.   Specialty:  Orthopedic Surgery Why:  For wound re-check, For suture removal Contact information: [address] [place] [address] [phone]           DISPOSITION: HOME VS. SNF  CONDITION:  Good   Donia Ast 04/30/2016, 9:48 AM

## 2016-05-02 ENCOUNTER — Encounter (HOSPITAL_COMMUNITY): Payer: Self-pay | Admitting: Orthopedic Surgery

## 2016-05-02 NOTE — Anesthesia Postprocedure Evaluation (Signed)
Anesthesia Post Note  Patient: Kasir Heinl  Procedure(s) Performed: Procedure(s) (LRB): HARDWARE REMOVAL (Right) EXCISION HETEROTOPIC BONE RIGHT HUMERUS (Right)  Patient location during evaluation: PACU Anesthesia Type: General and Regional Level of consciousness: awake and alert Pain management: pain level controlled Vital Signs Assessment: post-procedure vital signs reviewed and stable Respiratory status: spontaneous breathing, nonlabored ventilation, respiratory function stable and patient connected to nasal cannula oxygen Cardiovascular status: blood pressure returned to baseline and stable Postop Assessment: no signs of nausea or vomiting Anesthetic complications: no    Last Vitals:  Vitals:   04/30/16 0018 04/30/16 0514  BP: 131/76 136/81  Pulse: 93 84  Resp:    Temp: 37.8 C 36.7 C    Last Pain:  Vitals:   04/30/16 1042  TempSrc:   PainSc: 3                  Kennieth Rad

## 2016-05-03 ENCOUNTER — Ambulatory Visit (INDEPENDENT_AMBULATORY_CARE_PROVIDER_SITE_OTHER): Payer: Managed Care, Other (non HMO) | Admitting: *Deleted

## 2016-05-03 ENCOUNTER — Telehealth: Payer: Self-pay | Admitting: Cardiology

## 2016-05-03 DIAGNOSIS — Z95810 Presence of automatic (implantable) cardiac defibrillator: Secondary | ICD-10-CM | POA: Diagnosis not present

## 2016-05-03 NOTE — Telephone Encounter (Signed)
LMOVM reminding pt to send remote transmission.   

## 2016-05-03 NOTE — Telephone Encounter (Signed)
Follow Up:; ° ° °Returning your call. °

## 2016-05-03 NOTE — Telephone Encounter (Signed)
LMOVM for pt to return call 

## 2016-05-04 ENCOUNTER — Encounter: Payer: Self-pay | Admitting: Cardiology

## 2016-05-04 LAB — AEROBIC/ANAEROBIC CULTURE (SURGICAL/DEEP WOUND): CULTURE: NO GROWTH

## 2016-05-04 LAB — AEROBIC/ANAEROBIC CULTURE W GRAM STAIN (SURGICAL/DEEP WOUND)

## 2016-05-04 NOTE — Progress Notes (Signed)
Remote ICD transmission.   

## 2016-05-04 NOTE — Telephone Encounter (Signed)
Spoke w/ pt and informed him that we did receive his remote transmission.  

## 2016-05-17 ENCOUNTER — Other Ambulatory Visit (HOSPITAL_COMMUNITY): Payer: Self-pay | Admitting: Adult Health

## 2016-05-31 LAB — CUP PACEART REMOTE DEVICE CHECK
Battery Remaining Longevity: 90 mo
Brady Statistic RV Percent Paced: 0 %
HIGH POWER IMPEDANCE MEASURED VALUE: 50 Ohm
Implantable Lead Implant Date: 20120222
Implantable Lead Serial Number: 100951
Lead Channel Pacing Threshold Amplitude: 1.2 V
Lead Channel Setting Pacing Amplitude: 2.5 V
Lead Channel Setting Pacing Pulse Width: 0.5 ms
MDC IDC LEAD LOCATION: 753860
MDC IDC LEAD MODEL: 296
MDC IDC MSMT BATTERY REMAINING PERCENTAGE: 92 %
MDC IDC MSMT LEADCHNL RV IMPEDANCE VALUE: 384 Ohm
MDC IDC MSMT LEADCHNL RV PACING THRESHOLD PULSEWIDTH: 0.5 ms
MDC IDC PG SERIAL: 100238
MDC IDC SESS DTM: 20170924183900
MDC IDC SET LEADCHNL RV SENSING SENSITIVITY: 0.6 mV

## 2016-07-21 ENCOUNTER — Other Ambulatory Visit (HOSPITAL_COMMUNITY): Payer: Self-pay | Admitting: Cardiology

## 2016-08-13 ENCOUNTER — Other Ambulatory Visit (HOSPITAL_COMMUNITY): Payer: Self-pay | Admitting: Cardiology

## 2016-08-13 ENCOUNTER — Emergency Department (HOSPITAL_COMMUNITY): Payer: Managed Care, Other (non HMO)

## 2016-08-13 ENCOUNTER — Inpatient Hospital Stay (HOSPITAL_COMMUNITY)
Admission: EM | Admit: 2016-08-13 | Discharge: 2016-08-21 | DRG: 897 | Disposition: A | Discharge status: Home / Self Care | Payer: Managed Care, Other (non HMO) | Attending: Internal Medicine | Admitting: Internal Medicine

## 2016-08-13 ENCOUNTER — Encounter (HOSPITAL_COMMUNITY): Payer: Self-pay | Admitting: Certified Nurse Midwife

## 2016-08-13 DIAGNOSIS — E871 Hypo-osmolality and hyponatremia: Secondary | ICD-10-CM | POA: Diagnosis present

## 2016-08-13 DIAGNOSIS — Z8781 Personal history of (healed) traumatic fracture: Secondary | ICD-10-CM

## 2016-08-13 DIAGNOSIS — Z7984 Long term (current) use of oral hypoglycemic drugs: Secondary | ICD-10-CM | POA: Diagnosis not present

## 2016-08-13 DIAGNOSIS — D7589 Other specified diseases of blood and blood-forming organs: Secondary | ICD-10-CM | POA: Diagnosis present

## 2016-08-13 DIAGNOSIS — I110 Hypertensive heart disease with heart failure: Secondary | ICD-10-CM | POA: Diagnosis present

## 2016-08-13 DIAGNOSIS — E512 Wernicke's encephalopathy: Secondary | ICD-10-CM | POA: Diagnosis present

## 2016-08-13 DIAGNOSIS — R6510 Systemic inflammatory response syndrome (SIRS) of non-infectious origin without acute organ dysfunction: Secondary | ICD-10-CM | POA: Diagnosis present

## 2016-08-13 DIAGNOSIS — F10231 Alcohol dependence with withdrawal delirium: Secondary | ICD-10-CM | POA: Diagnosis not present

## 2016-08-13 DIAGNOSIS — E878 Other disorders of electrolyte and fluid balance, not elsewhere classified: Secondary | ICD-10-CM | POA: Diagnosis present

## 2016-08-13 DIAGNOSIS — F10129 Alcohol abuse with intoxication, unspecified: Secondary | ICD-10-CM | POA: Diagnosis not present

## 2016-08-13 DIAGNOSIS — Z791 Long term (current) use of non-steroidal anti-inflammatories (NSAID): Secondary | ICD-10-CM | POA: Diagnosis not present

## 2016-08-13 DIAGNOSIS — R442 Other hallucinations: Secondary | ICD-10-CM | POA: Diagnosis present

## 2016-08-13 DIAGNOSIS — N200 Calculus of kidney: Secondary | ICD-10-CM | POA: Diagnosis present

## 2016-08-13 DIAGNOSIS — I959 Hypotension, unspecified: Secondary | ICD-10-CM | POA: Diagnosis present

## 2016-08-13 DIAGNOSIS — Z880 Allergy status to penicillin: Secondary | ICD-10-CM | POA: Diagnosis not present

## 2016-08-13 DIAGNOSIS — Z95810 Presence of automatic (implantable) cardiac defibrillator: Secondary | ICD-10-CM

## 2016-08-13 DIAGNOSIS — Z720 Tobacco use: Secondary | ICD-10-CM

## 2016-08-13 DIAGNOSIS — R112 Nausea with vomiting, unspecified: Secondary | ICD-10-CM | POA: Diagnosis present

## 2016-08-13 DIAGNOSIS — R000 Tachycardia, unspecified: Secondary | ICD-10-CM | POA: Diagnosis present

## 2016-08-13 DIAGNOSIS — R740 Nonspecific elevation of levels of transaminase and lactic acid dehydrogenase [LDH]: Secondary | ICD-10-CM | POA: Diagnosis present

## 2016-08-13 DIAGNOSIS — I5022 Chronic systolic (congestive) heart failure: Secondary | ICD-10-CM | POA: Diagnosis present

## 2016-08-13 DIAGNOSIS — Z563 Stressful work schedule: Secondary | ICD-10-CM

## 2016-08-13 DIAGNOSIS — Z636 Dependent relative needing care at home: Secondary | ICD-10-CM

## 2016-08-13 DIAGNOSIS — D72829 Elevated white blood cell count, unspecified: Secondary | ICD-10-CM | POA: Diagnosis present

## 2016-08-13 DIAGNOSIS — F10931 Alcohol use, unspecified with withdrawal delirium: Secondary | ICD-10-CM | POA: Diagnosis present

## 2016-08-13 DIAGNOSIS — R7402 Elevation of levels of lactic acid dehydrogenase (LDH): Secondary | ICD-10-CM | POA: Diagnosis present

## 2016-08-13 DIAGNOSIS — Z8249 Family history of ischemic heart disease and other diseases of the circulatory system: Secondary | ICD-10-CM

## 2016-08-13 DIAGNOSIS — E876 Hypokalemia: Secondary | ICD-10-CM | POA: Diagnosis present

## 2016-08-13 DIAGNOSIS — G250 Essential tremor: Secondary | ICD-10-CM | POA: Diagnosis present

## 2016-08-13 DIAGNOSIS — Z91048 Other nonmedicinal substance allergy status: Secondary | ICD-10-CM | POA: Diagnosis not present

## 2016-08-13 DIAGNOSIS — Z981 Arthrodesis status: Secondary | ICD-10-CM

## 2016-08-13 DIAGNOSIS — Z79891 Long term (current) use of opiate analgesic: Secondary | ICD-10-CM

## 2016-08-13 DIAGNOSIS — R270 Ataxia, unspecified: Secondary | ICD-10-CM

## 2016-08-13 DIAGNOSIS — Z635 Disruption of family by separation and divorce: Secondary | ICD-10-CM

## 2016-08-13 DIAGNOSIS — Y908 Blood alcohol level of 240 mg/100 ml or more: Secondary | ICD-10-CM | POA: Diagnosis present

## 2016-08-13 DIAGNOSIS — E119 Type 2 diabetes mellitus without complications: Secondary | ICD-10-CM

## 2016-08-13 DIAGNOSIS — I509 Heart failure, unspecified: Secondary | ICD-10-CM | POA: Diagnosis not present

## 2016-08-13 DIAGNOSIS — M1612 Unilateral primary osteoarthritis, left hip: Secondary | ICD-10-CM | POA: Diagnosis present

## 2016-08-13 DIAGNOSIS — Z87442 Personal history of urinary calculi: Secondary | ICD-10-CM

## 2016-08-13 LAB — COMPREHENSIVE METABOLIC PANEL
ALK PHOS: 122 U/L (ref 38–126)
ALT: 66 U/L — ABNORMAL HIGH (ref 17–63)
ANION GAP: 12 (ref 5–15)
AST: 109 U/L — ABNORMAL HIGH (ref 15–41)
Albumin: 3.2 g/dL — ABNORMAL LOW (ref 3.5–5.0)
BUN: 11 mg/dL (ref 6–20)
CALCIUM: 10 mg/dL (ref 8.9–10.3)
CO2: 25 mmol/L (ref 22–32)
Chloride: 97 mmol/L — ABNORMAL LOW (ref 101–111)
Creatinine, Ser: 0.88 mg/dL (ref 0.61–1.24)
GFR calc non Af Amer: >60 mL/min (ref >60)
Glucose, Bld: 113 mg/dL — ABNORMAL HIGH (ref 65–99)
Potassium: 4 mmol/L (ref 3.5–5.1)
SODIUM: 134 mmol/L — AB (ref 135–145)
Total Bilirubin: 1.2 mg/dL (ref 0.3–1.2)
Total Protein: 6.6 g/dL (ref 6.5–8.1)

## 2016-08-13 LAB — I-STAT CHEM 8, ED
BUN: 12 mg/dL (ref 6–20)
CREATININE: 1.1 mg/dL (ref 0.61–1.24)
Calcium, Ion: 1.17 mmol/L (ref 1.15–1.40)
Chloride: 95 mmol/L — ABNORMAL LOW (ref 101–111)
Glucose, Bld: 114 mg/dL — ABNORMAL HIGH (ref 65–99)
HEMATOCRIT: 39 % (ref 39.0–52.0)
HEMOGLOBIN: 13.3 g/dL (ref 13.0–17.0)
POTASSIUM: 3.9 mmol/L (ref 3.5–5.1)
Sodium: 134 mmol/L — ABNORMAL LOW (ref 135–145)
TCO2: 24 mmol/L (ref 0–100)

## 2016-08-13 LAB — CBC
HCT: 37.2 % — ABNORMAL LOW (ref 39.0–52.0)
HEMOGLOBIN: 13.2 g/dL (ref 13.0–17.0)
MCH: 37.2 pg — AB (ref 26.0–34.0)
MCHC: 35.5 g/dL (ref 30.0–36.0)
MCV: 104.8 fL — ABNORMAL HIGH (ref 78.0–100.0)
PLATELETS: 234 10*3/uL (ref 150–400)
RBC: 3.55 MIL/uL — ABNORMAL LOW (ref 4.22–5.81)
RDW: 14.4 % (ref 11.5–15.5)
WBC: 12.1 10*3/uL — ABNORMAL HIGH (ref 4.0–10.5)

## 2016-08-13 LAB — I-STAT CG4 LACTIC ACID, ED
LACTIC ACID, VENOUS: 3.64 mmol/L — AB (ref 0.5–1.9)
LACTIC ACID, VENOUS: 4.15 mmol/L — AB (ref 0.5–1.9)

## 2016-08-13 LAB — RAPID URINE DRUG SCREEN, HOSP PERFORMED
Amphetamines: NOT DETECTED
Barbiturates: NOT DETECTED
Benzodiazepines: NOT DETECTED
Cocaine: NOT DETECTED
Opiates: NOT DETECTED
Tetrahydrocannabinol: NOT DETECTED

## 2016-08-13 LAB — LIPASE, BLOOD: LIPASE: 41 U/L (ref 11–51)

## 2016-08-13 LAB — URINALYSIS, ROUTINE W REFLEX MICROSCOPIC
BILIRUBIN URINE: NEGATIVE
Glucose, UA: NEGATIVE mg/dL
Hgb urine dipstick: NEGATIVE
KETONES UR: NEGATIVE mg/dL
Leukocytes, UA: NEGATIVE
NITRITE: NEGATIVE
PROTEIN: NEGATIVE mg/dL
Specific Gravity, Urine: 1.004 — ABNORMAL LOW (ref 1.005–1.030)
pH: 7 (ref 5.0–8.0)

## 2016-08-13 LAB — I-STAT TROPONIN, ED: TROPONIN I, POC: 0.02 ng/mL (ref 0.00–0.08)

## 2016-08-13 LAB — CBG MONITORING, ED: Glucose-Capillary: 106 mg/dL — ABNORMAL HIGH (ref 65–99)

## 2016-08-13 LAB — SAMPLE TO BLOOD BANK

## 2016-08-13 LAB — ETHANOL: ALCOHOL ETHYL (B): 193 mg/dL — AB (ref <5)

## 2016-08-13 LAB — LACTIC ACID, PLASMA
LACTIC ACID, VENOUS: 3.2 mmol/L — AB (ref 0.5–1.9)
Lactic Acid, Venous: 3.6 mmol/L (ref 0.5–1.9)

## 2016-08-13 MED ORDER — HYDROCORTISONE 1 % EX CREA
1.0000 "application " | TOPICAL_CREAM | Freq: Two times a day (BID) | CUTANEOUS | Status: DC | PRN
  Administered 2016-08-14 – 2016-08-20 (×3): 1 via TOPICAL
  Filled 2016-08-13 (×5): qty 28

## 2016-08-13 MED ORDER — INSULIN ASPART 100 UNIT/ML ~~LOC~~ SOLN
0.0000 [IU] | Freq: Three times a day (TID) | SUBCUTANEOUS | Status: DC
  Administered 2016-08-15 – 2016-08-17 (×5): 1 [IU] via SUBCUTANEOUS
  Administered 2016-08-17: 2 [IU] via SUBCUTANEOUS
  Administered 2016-08-18 – 2016-08-19 (×2): 1 [IU] via SUBCUTANEOUS

## 2016-08-13 MED ORDER — HYDROCODONE-ACETAMINOPHEN 5-325 MG PO TABS: 1.0000 | ORAL_TABLET | ORAL | Status: DC | PRN

## 2016-08-13 MED ORDER — SODIUM CHLORIDE 0.9 % IV SOLN
INTRAVENOUS | Status: AC
  Administered 2016-08-14: 08:00:00 via INTRAVENOUS

## 2016-08-13 MED ORDER — SODIUM CHLORIDE 0.9 % IV BOLUS (SEPSIS)
1000.0000 mL | Freq: Once | INTRAVENOUS | Status: AC
  Administered 2016-08-13: 1000 mL via INTRAVENOUS

## 2016-08-13 MED ORDER — ENOXAPARIN SODIUM 40 MG/0.4ML ~~LOC~~ SOLN
40.0000 mg | SUBCUTANEOUS | Status: DC
  Administered 2016-08-14 – 2016-08-21 (×8): 40 mg via SUBCUTANEOUS
  Filled 2016-08-13 (×8): qty 0.4

## 2016-08-13 MED ORDER — IOPAMIDOL (ISOVUE-370) INJECTION 76%
INTRAVENOUS | Status: AC
  Administered 2016-08-13: 100 mL
  Filled 2016-08-13: qty 100

## 2016-08-13 MED ORDER — LORAZEPAM 2 MG/ML IJ SOLN
2.0000 mg | INTRAMUSCULAR | Status: DC | PRN
  Administered 2016-08-14 (×2): 2 mg via INTRAVENOUS
  Administered 2016-08-15 (×2): 3 mg via INTRAVENOUS
  Administered 2016-08-15 (×2): 2 mg via INTRAVENOUS
  Administered 2016-08-16 (×2): 3 mg via INTRAVENOUS
  Administered 2016-08-16 – 2016-08-17 (×4): 2 mg via INTRAVENOUS
  Filled 2016-08-13: qty 2
  Filled 2016-08-13 (×6): qty 1
  Filled 2016-08-13 (×3): qty 2
  Filled 2016-08-13 (×4): qty 1

## 2016-08-13 MED ORDER — ONDANSETRON HCL 4 MG/2ML IJ SOLN: 4.0000 mg | Freq: Four times a day (QID) | INTRAMUSCULAR | Status: DC | PRN

## 2016-08-13 MED ORDER — ONDANSETRON HCL 4 MG PO TABS
4.0000 mg | ORAL_TABLET | Freq: Four times a day (QID) | ORAL | Status: DC | PRN
  Administered 2016-08-17 – 2016-08-19 (×2): 4 mg via ORAL
  Filled 2016-08-13 (×2): qty 1

## 2016-08-13 MED ORDER — ACETAMINOPHEN 650 MG RE SUPP: 650.0000 mg | Freq: Four times a day (QID) | RECTAL | Status: DC | PRN

## 2016-08-13 MED ORDER — SODIUM CHLORIDE 0.9 % IV BOLUS (SEPSIS)
500.0000 mL | Freq: Once | INTRAVENOUS | Status: AC
  Administered 2016-08-13: 500 mL via INTRAVENOUS

## 2016-08-13 MED ORDER — SODIUM CHLORIDE 0.9 % IV SOLN
2000.0000 mg | Freq: Once | INTRAVENOUS | Status: AC
  Administered 2016-08-13: 2000 mg via INTRAVENOUS
  Filled 2016-08-13: qty 2000

## 2016-08-13 MED ORDER — DEXTROSE 5 % IV SOLN: 2.0000 g | Freq: Once | INTRAVENOUS | Status: DC

## 2016-08-13 MED ORDER — ACETAMINOPHEN 325 MG PO TABS: 650.0000 mg | ORAL_TABLET | Freq: Four times a day (QID) | ORAL | Status: DC | PRN

## 2016-08-13 MED ORDER — POLYETHYLENE GLYCOL 3350 17 G PO PACK: 17.0000 g | PACK | Freq: Every day | ORAL | Status: DC | PRN

## 2016-08-13 MED ORDER — VANCOMYCIN HCL IN DEXTROSE 1-5 GM/200ML-% IV SOLN
1000.0000 mg | Freq: Two times a day (BID) | INTRAVENOUS | Status: DC
Start: 2016-08-14 — End: 2016-08-14
  Administered 2016-08-14: 1000 mg via INTRAVENOUS
  Filled 2016-08-13 (×3): qty 200

## 2016-08-13 MED ORDER — DEXTROSE 5 % IV SOLN
2.0000 g | Freq: Two times a day (BID) | INTRAVENOUS | Status: DC
  Administered 2016-08-13: 2 g via INTRAVENOUS
  Filled 2016-08-13: qty 2

## 2016-08-13 MED ORDER — LORAZEPAM 2 MG/ML IJ SOLN
2.0000 mg | Freq: Once | INTRAMUSCULAR | Status: AC
  Administered 2016-08-13: 2 mg via INTRAVENOUS
  Filled 2016-08-13: qty 1

## 2016-08-13 MED ORDER — ADULT MULTIVITAMIN W/MINERALS CH
1.0000 | ORAL_TABLET | Freq: Every day | ORAL | Status: DC
  Administered 2016-08-14 – 2016-08-21 (×8): 1 via ORAL
  Filled 2016-08-13 (×8): qty 1

## 2016-08-13 MED ORDER — INSULIN ASPART 100 UNIT/ML ~~LOC~~ SOLN: 0.0000 [IU] | Freq: Every day | SUBCUTANEOUS | Status: DC

## 2016-08-13 MED ORDER — BISACODYL 5 MG PO TBEC
5.0000 mg | DELAYED_RELEASE_TABLET | Freq: Every day | ORAL | Status: DC | PRN
  Filled 2016-08-13: qty 1

## 2016-08-13 MED ORDER — LORAZEPAM 2 MG/ML IJ SOLN: 1.0000 mg | Freq: Once | INTRAMUSCULAR | Status: DC

## 2016-08-13 MED ORDER — THIAMINE HCL 100 MG/ML IJ SOLN
Freq: Once | INTRAVENOUS | Status: AC
  Administered 2016-08-13: 21:00:00 via INTRAVENOUS
  Filled 2016-08-13: qty 1000

## 2016-08-13 MED ORDER — FOLIC ACID 5 MG/ML IJ SOLN
1.0000 mg | Freq: Every day | INTRAMUSCULAR | Status: DC
  Administered 2016-08-14: 1 mg via INTRAVENOUS
  Filled 2016-08-13: qty 0.2

## 2016-08-13 MED ORDER — METHOCARBAMOL 500 MG PO TABS
500.0000 mg | ORAL_TABLET | Freq: Four times a day (QID) | ORAL | Status: DC | PRN
  Administered 2016-08-14 – 2016-08-20 (×7): 500 mg via ORAL
  Filled 2016-08-13 (×7): qty 1

## 2016-08-13 MED ORDER — PROMETHAZINE HCL 25 MG/ML IJ SOLN
12.5000 mg | Freq: Once | INTRAMUSCULAR | Status: AC
  Administered 2016-08-13: 12.5 mg via INTRAVENOUS
  Filled 2016-08-13: qty 1

## 2016-08-13 MED ORDER — LORAZEPAM 2 MG/ML IJ SOLN
1.0000 mg | Freq: Once | INTRAMUSCULAR | Status: AC
  Administered 2016-08-13: 1 mg via INTRAVENOUS
  Filled 2016-08-13: qty 1

## 2016-08-13 MED ORDER — THIAMINE HCL 100 MG/ML IJ SOLN
100.0000 mg | Freq: Every day | INTRAMUSCULAR | Status: DC
  Administered 2016-08-14: 100 mg via INTRAVENOUS
  Filled 2016-08-13: qty 2

## 2016-08-13 MED ORDER — SODIUM CHLORIDE 0.9% FLUSH
3.0000 mL | Freq: Two times a day (BID) | INTRAVENOUS | Status: DC
  Administered 2016-08-14 – 2016-08-20 (×12): 3 mL via INTRAVENOUS

## 2016-08-13 NOTE — ED Notes (Signed)
Justin Bernard) notified of pt's CBG result. Pt's CBG result was 106. CBG obtained by Andyn Sales (NT).

## 2016-08-13 NOTE — ED Notes (Signed)
Pt transported to xray 

## 2016-08-13 NOTE — ED Notes (Signed)
Care handoff to Anselmo Rod, RNs

## 2016-08-13 NOTE — ED Triage Notes (Signed)
Pt states that he has weakness, tremors, and nausea for over a year but is getting progressively worse. Pt states he is constipated but is unable to eat by mouth due to tremors. Pt has significant cardiac hx.

## 2016-08-13 NOTE — Progress Notes (Signed)
Pharmacy Antibiotic Note  Justin Bernard is a 55 y.o. male admitted on 08/13/2016 with sepsis.  Pharmacy has been consulted for vancomycin and cefepime dosing.  Patient received vancomycin 2g IV load once in the ED.  Plan: Vancomycin 1g IV every 12 hours.  Goal trough 15-20 mcg/mL.  Cefepime 2g IV q12h  Monitor culture data, renal function and clinical course VT at SS prn  Height: 6\' 4"  (193 cm) Weight: 250 lb (113.4 kg) IBW/kg (Calculated) : 86.8  Temp (24hrs), Avg:98.9 F (37.2 C), Min:98.9 F (37.2 C), Max:98.9 F (37.2 C)   Recent Labs Lab 08/13/16 1351 08/13/16 1436 08/13/16 1603 08/13/16 1705  WBC 12.1*  --   --   --   CREATININE 0.88 1.10  --   --   LATICACIDVEN  --  3.64* 3.2* 4.15*    Estimated Creatinine Clearance: 105.8 mL/min (by C-G formula based on SCr of 1.1 mg/dL).    Allergies  Allergen Reactions  . Chlorhexidine Itching  . Penicillins Other (See Comments)     childhood reaction, swelling & had to be given an antidote, but reports that he had antibiotic at a later time & it "had some penicillin in it & there was not any allergy reaction      Arlean Hopping. Newman Pies, PharmD, BCPS Clinical Pharmacist Pager [phone] 08/13/2016 6:02 PM

## 2016-08-13 NOTE — ED Provider Notes (Addendum)
[facility] DEPT Provider Note   CSN: [id number] Arrival date & time: 08/13/16  1332     History   Chief Complaint Chief Complaint  Patient presents with  . Nausea  . Fatigue  . Tremors  . Weakness    HPI Zev Blue is a 55 y.o. male.  The history is provided by the patient (ex spouse).  Emesis   This is a recurrent problem. The current episode started less than 1 hour ago. The problem occurs 2 to 4 times per day. The problem has not changed since onset.The emesis has an appearance of stomach contents. There has been no fever. Associated symptoms include abdominal pain, chills and sweats. Pertinent negatives include no cough, no diarrhea, no headaches, no myalgias and no URI.      Past Medical History:  Diagnosis Date  . AICD (automatic cardioverter/defibrillator) present   . Arthritis    L hip   . Asthma    pt. uses albuterol inhaler in the spring for enviromental allergies   . CHF (congestive heart failure) ([place])   . Chronic systolic heart failure (HCC)    NICM, previous heart transplant candidate with EF 10%  . Diabetes mellitus without complication ([place])   . Family history of adverse reaction to anesthesia    sister- headache & N&V  . History of hiatal hernia   . Hypertension   . Kidney stones 1997   passed spontaneously   . Neuromuscular disorder ([place])    essential tremor  . Pneumonia 2013   hosp. - IV antibiotics   . Presence of permanent cardiac pacemaker 2012   [employer]  . Shortness of breath dyspnea     Patient Active Problem List   Diagnosis Date Noted  . Painful orthopaedic hardware ([place]) 04/29/2016  . S/P hardware removal 04/29/2016  . ICD (implantable cardioverter-defibrillator) in place 08/04/2015  . Dyspnea   . Shoulder fracture 07/20/2015  . Hypokalemia 07/17/2015  . Orthostatic hypotension 07/17/2015  . Chronic systolic CHF (congestive heart failure), NYHA class 2 ([place]) 07/17/2015  . Dehydration 07/17/2015  .  Hyponatremia 07/17/2015  . Fracture closed, humerus 07/17/2015  . Fall 07/17/2015    Past Surgical History:  Procedure Laterality Date  . BACK SURGERY  07/2000   fusion , [place]   . BONE EXCISION Right 04/29/2016   Procedure: EXCISION HETEROTOPIC BONE RIGHT HUMERUS;  Surgeon: Altamese Pine Ridge, MD;  Location: [facility];  Service: Orthopedics;  Laterality: Right;  . CARDIAC CATHETERIZATION  08/2012  . FRACTURE SURGERY Left    hip , r shoulder   . HARDWARE REMOVAL Right 04/29/2016   Procedure: HARDWARE REMOVAL;  Surgeon: Altamese Warba, MD;  Location: [facility];  Service: Orthopedics;  Laterality: Right;  . HERNIA REPAIR Right    inguinal   . HIP SURGERY Left    hardware present, for slipped joint   . ICD    . ORIF HUMERUS FRACTURE Right 07/20/2015   Procedure: OPEN REDUCTION INTERNAL FIXATION (ORIF) PROXIMAL HUMERUS FRACTURE;  Surgeon: Netta Cedars, MD;  Location: [facility];  Service: Orthopedics;  Laterality: Right;  . TONSILLECTOMY    . ulnar surgery Right        Home Medications    Prior to Admission medications   Medication Sig Start Date End Date Taking? Authorizing Provider  carvedilol (COREG) 6.25 MG tablet TAKE 1 TABLET BY MOUTH TWICE A DAY WITH A MEAL 07/21/16   Larey Dresser, MD  furosemide (LASIX) 40 MG tablet Take 20-40 mg by mouth  See admin instructions. 40 mg in the morning.   Take 20 mg AT 4 O'CLOCK    Historical Provider, MD  glipiZIDE (GLUCOTROL XL) 5 MG 24 hr tablet Take 5 mg by mouth daily with breakfast.  04/24/16   Historical Provider, MD  glucose monitoring kit (FREESTYLE) monitoring kit 1 each by Does not apply route 4 (four) times daily - after meals and at bedtime. 1 month Diabetic Testing Supplies for QAC-QHS accuchecks. 07/23/15   Shanker Kristeen Mans, MD  HYDROcodone-acetaminophen (NORCO) 7.5-325 MG tablet Take 1-2 tablets by mouth every 6 (six) hours as needed for moderate pain or severe pain. 04/29/16   Ainsley Spinner, PA-C  ketorolac (TORADOL) 10 MG tablet Take 1 tablet  (10 mg total) by mouth every 6 (six) hours as needed for moderate pain. 04/29/16   Ainsley Spinner, PA-C  methocarbamol (ROBAXIN) 500 MG tablet Take 500 mg by mouth every 6 (six) hours as needed.     Historical Provider, MD  potassium chloride SA (KLOR-CON M20) 20 MEQ tablet Take 2 tabs in the AM and 1 tab in the PM Patient taking differently: Take 20 mEq by mouth 2 (two) times daily.  04/25/16   Amy D Clegg, NP  potassium chloride SA (KLOR-CON M20) 20 MEQ tablet Take 1 tablet (20 mEq total) by mouth 2 (two) times daily. 05/17/16   Amy D Ninfa Meeker, NP  spironolactone (ALDACTONE) 25 MG tablet Take 1 tablet (25 mg total) by mouth daily. 08/14/15   Larey Dresser, MD  traMADol (ULTRAM) 50 MG tablet Take 50 mg by mouth as needed (at bedtime and before physical therapy).    Historical Provider, MD    Family History Family History  Problem Relation Age of Onset  . Heart failure Mother   . Hypertension Mother   . CAD Father   . Hypertension Other   . CAD Other     Social History Social History  Substance Use Topics  . Smoking status: Never Smoker  . Smokeless tobacco: Current User    Types: Chew  . Alcohol use Yes     Comment: once q 2 weeks, occasional beer, wine, mixed drink     Allergies   Chlorhexidine and Penicillins   Review of Systems Review of Systems  Constitutional: Positive for chills.  Respiratory: Negative for cough.   Gastrointestinal: Positive for abdominal pain and vomiting. Negative for diarrhea.  Musculoskeletal: Negative for myalgias.  Neurological: Positive for tremors. Negative for headaches.  All other systems reviewed and are negative.    Physical Exam Updated Vital Signs BP 91/68   Pulse 101   Temp 98.9 F (37.2 C) (Oral)   Resp 23   Ht '6\' 4"'$  (1.93 m)   Wt 250 lb (113.4 kg)   SpO2 96%   BMI 30.43 kg/m   Physical Exam  Constitutional: He is oriented to person, place, and time. He appears well-nourished.  Patient tremulous all over.  HENT:  Head:  Normocephalic and atraumatic.  Eyes: Conjunctivae are normal.  Neck: Normal range of motion. Neck supple.  Cardiovascular:  Tachycardia.  Pulmonary/Chest: Effort normal and breath sounds normal. No respiratory distress. He has no wheezes.  Abdominal: Soft. Bowel sounds are normal. He exhibits no distension. There is no tenderness.  Neurological: He is oriented to person, place, and time. No cranial nerve deficit. Coordination normal.  Skin: Skin is warm and dry. He is not diaphoretic.  Excoriations scattered.  Psychiatric:  Avoids question of alcohol. Poor historian.  ED Treatments / Results  Labs (all labs ordered are listed, but only abnormal results are displayed) Labs Reviewed  CBC - Abnormal; Notable for the following:       Result Value   WBC 12.1 (*)    RBC 3.55 (*)    HCT 37.2 (*)    MCV 104.8 (*)    MCH 37.2 (*)    All other components within normal limits  COMPREHENSIVE METABOLIC PANEL - Abnormal; Notable for the following:    Sodium 134 (*)    Chloride 97 (*)    Glucose, Bld 113 (*)    Albumin 3.2 (*)    AST 109 (*)    ALT 66 (*)    All other components within normal limits  I-STAT CHEM 8, ED - Abnormal; Notable for the following:    Sodium 134 (*)    Chloride 95 (*)    Glucose, Bld 114 (*)    All other components within normal limits  I-STAT CG4 LACTIC ACID, ED - Abnormal; Notable for the following:    Lactic Acid, Venous 3.64 (*)    All other components within normal limits  CULTURE, BLOOD (ROUTINE X 2)  CULTURE, BLOOD (ROUTINE X 2)  URINE CULTURE  URINALYSIS, ROUTINE W REFLEX MICROSCOPIC  LACTIC ACID, PLASMA  ETHANOL  RAPID URINE DRUG SCREEN, HOSP PERFORMED  SAMPLE TO BLOOD BANK    EKG  EKG Interpretation None       Radiology No results found.  Procedures Procedures (including critical care time)  Medications Ordered in ED Medications  sodium chloride 0.9 % bolus 1,000 mL (not administered)  promethazine (PHENERGAN) injection 12.5  mg (not administered)  LORazepam (ATIVAN) injection 1 mg (not administered)  sodium chloride 0.9 % bolus 500 mL (500 mLs Intravenous New Bag/Given 08/13/16 1444)     Initial Impression / Assessment and Plan / ED Course  I have reviewed the triage vital signs and the nursing notes.  Pertinent labs & imaging results that were available during my care of the patient were reviewed by me and considered in my medical decision making (see chart for details).  Clinical Course    She is a 55 year old male brought in by his ex-wife. Patient's ex-wife reports that she went to visit him today to talk about her children and he appeared very well so they can come to hospital. Patient reports she's not been feeling well for the last several days. He went to get a Phenergan shot by his primary care physician. Patient is tremulous. He appears to be going through alcohol withdrawal. When questioned about it he is unsure when he last took alcohol. He says he only drinks maybe once or twice a week. Has a ex-wife says that he drinks much more frequently than that.  Patient initially hypotensive here with a lactic of 3.67. Patient has no fever or infectious symptoms. Denies any urinary issues, rectal bleeding, diarrhea, cough, congestion. Just endorses nausea/ intermittent abdominal pain for the last week. Concern the patient may be going through alcohol withdrawal which is causing his vital sign abnormalities. Patient has CHF and because I'm not thoroughly convinced that this is sepsis, I would like to be gentle with the fluids.   5:13 PM Patient having increased lactic. So maybe it is due to sepsis? Will give braod spectrum abx. Vitals signs reacted well to fluids and ativan. Abdominal exam unimpressive, could be that mesenteric ischemia is causing the elevated lactics?  Will get CTA to better evaluate.  Final Clinical Impressions(s) / ED Diagnoses   Final diagnoses:  None    New Prescriptions New  Prescriptions   No medications on file     Kingsley Herandez Julio Alm, MD 08/13/16 [place], MD 08/13/16 1800

## 2016-08-13 NOTE — H&P (Signed)
History and Physical    Markees Carns CXK:[id number] DOB: Jun 20, 1961 DOA: 08/13/2016  PCP: No PCP Per Patient   Patient coming from: Home  Chief Complaint: Tremors, nausea/vomiting, malaise   HPI: Justin Bernard is a 55 y.o. male with medical history significant for hypertension, essential tremor, nephrolithiasis, and chronic systolic CHF who presents to the emergency department with tremors, nausea, vomiting, and malaise. Patient reports that he suffers from essential tremor, but this has been much worse over the past couple days, over which time he is also developed nausea with dry heaving. The patient's ex-wife visited him today to discuss something about their children and noted him to look ill, dry heaving, diaphoretic, and shaking. She brought him into the emergency department for further evaluation of this. The patient initially denied any significant alcohol use, but later endorsed very mild consumption. He gave permission to discuss care with his ex-wife, who reports a strong suspicion for chronic daily heavy drinking for the past couple years at least. Patient denies any recent fevers or chills, denies rhinorrhea or sore throat, denies chest pain or palpitations, and denies dyspnea or cough.  ED Course: Upon arrival to the ED, patient is found to be afebrile, saturating well on room air, tachycardic to 110s, and hypotensive to 66/50. EKG reveals a sinus or possibly ectopic atrial tachycardia with rate 101. Chest x-ray is negative for acute cardiopulmonary disease. Chemistry panels notable for mild hyponatremia and hypochloremia, and mild elevations in serum transaminases. Ethanol level is elevated 293, UDS is negative, and urinalysis is unremarkable. CBC is notable for mild leukocytosis to 12,100 and macrocytosis with MCV of 104.8, but no anemia. Lactic acid is elevated to 3.64 initially, then down to 3.2, before rising again up to 4.5. Blood and urine cultures were obtained in the ED and  patient was given 2 l of normal saline. He was treated empirically with vancomycin and cefepime and given multiple doses of IV Ativan for withdrawal symptoms. Patient was experiencing visual hallucinations in the emergency department consistent with withdrawal. Blood pressure responded very well to IV fluids, but patient remains mildly tachycardic. He will be admitted to the stepdown unit for ongoing evaluation and management of suspected complicated alcohol withdrawal.  Review of Systems:  All other systems reviewed and apart from HPI, are negative.  Past Medical History:  Diagnosis Date  . AICD (automatic cardioverter/defibrillator) present   . Arthritis    L hip   . Asthma    pt. uses albuterol inhaler in the spring for enviromental allergies   . CHF (congestive heart failure) ([place])   . Chronic systolic heart failure (HCC)    NICM, previous heart transplant candidate with EF 10%  . Diabetes mellitus without complication ([place])   . Family history of adverse reaction to anesthesia    sister- headache & N&V  . History of hiatal hernia   . Hypertension   . Kidney stones 1997   passed spontaneously   . Neuromuscular disorder ([place])    essential tremor  . Pneumonia 2013   hosp. - IV antibiotics   . Presence of permanent cardiac pacemaker 2012   [employer]  . Shortness of breath dyspnea     Past Surgical History:  Procedure Laterality Date  . BACK SURGERY  07/2000   fusion , [place]   . BONE EXCISION Right 04/29/2016   Procedure: EXCISION HETEROTOPIC BONE RIGHT HUMERUS;  Surgeon: Altamese Warroad, MD;  Location: [facility];  Service: Orthopedics;  Laterality: Right;  . CARDIAC  CATHETERIZATION  08/2012  . FRACTURE SURGERY Left    hip , r shoulder   . HARDWARE REMOVAL Right 04/29/2016   Procedure: HARDWARE REMOVAL;  Surgeon: Altamese Providence, MD;  Location: [facility];  Service: Orthopedics;  Laterality: Right;  . HERNIA REPAIR Right    inguinal   . HIP SURGERY Left    hardware  present, for slipped joint   . ICD    . ORIF HUMERUS FRACTURE Right 07/20/2015   Procedure: OPEN REDUCTION INTERNAL FIXATION (ORIF) PROXIMAL HUMERUS FRACTURE;  Surgeon: Netta Cedars, MD;  Location: [facility];  Service: Orthopedics;  Laterality: Right;  . TONSILLECTOMY    . ulnar surgery Right      reports that he has never smoked. His smokeless tobacco use includes Chew. He reports that he drinks alcohol. He reports that he does not use drugs.  Allergies  Allergen Reactions  . Chlorhexidine Itching  . Penicillins Other (See Comments)     childhood reaction, swelling & had to be given an antidote, but reports that he had antibiotic at a later time & it "had some penicillin in it & there was not any allergy reaction     Family History  Problem Relation Age of Onset  . Heart failure Mother   . Hypertension Mother   . CAD Father   . Hypertension Other   . CAD Other      Prior to Admission medications   Medication Sig Start Date End Date Taking? Authorizing Provider  carvedilol (COREG) 6.25 MG tablet TAKE 1 TABLET BY MOUTH TWICE A DAY WITH A MEAL Patient taking differently: TAKE 1 TABLET BY MOUTH ONCE DAILY 07/21/16  Yes Larey Dresser, MD  furosemide (LASIX) 40 MG tablet Take 20 mg by mouth 2 (two) times daily.    Yes Historical Provider, MD  glipiZIDE (GLUCOTROL XL) 5 MG 24 hr tablet Take 5 mg by mouth daily with breakfast.  04/24/16  Yes Historical Provider, MD  hydrocortisone cream 1 % Apply 1 application topically 2 (two) times daily as needed for itching.   Yes Historical Provider, MD  methocarbamol (ROBAXIN) 500 MG tablet Take 500 mg by mouth every 6 (six) hours as needed for muscle spasms.    Yes Historical Provider, MD  potassium chloride SA (KLOR-CON M20) 20 MEQ tablet Take 1 tablet (20 mEq total) by mouth 2 (two) times daily. 05/17/16  Yes Amy D Clegg, NP  spironolactone (ALDACTONE) 25 MG tablet Take 1 tablet (25 mg total) by mouth daily. 08/14/15  Yes Larey Dresser, MD  glucose  monitoring kit (FREESTYLE) monitoring kit 1 each by Does not apply route 4 (four) times daily - after meals and at bedtime. 1 month Diabetic Testing Supplies for QAC-QHS accuchecks. 07/23/15   Shanker Kristeen Mans, MD  HYDROcodone-acetaminophen (NORCO) 7.5-325 MG tablet Take 1-2 tablets by mouth every 6 (six) hours as needed for moderate pain or severe pain. 04/29/16   Ainsley Spinner, PA-C    Physical Exam: Vitals:   08/13/16 1852 08/13/16 1900 08/13/16 1915 08/13/16 1930  BP: 134/84 107/76 107/77 109/79  Pulse: 115     Resp:   23 20  Temp:      TempSrc:      SpO2:      Weight:      Height:          Constitutional: No respiratory distress, tremulous, appears uncomfortable Eyes: PERTLA, lids and conjunctivae normal ENMT: Mucous membranes are dry. Posterior pharynx clear of any exudate or lesions.  Neck: normal, supple, no masses, no thyromegaly Respiratory: clear to auscultation bilaterally, no wheezing, no crackles. Normal respiratory effort.   Cardiovascular: Rate ~110 and regular. No extremity edema. No significant JVD. Abdomen: No distension, no tenderness, no masses palpated. Bowel sounds normal. No CVA tenderness.  Musculoskeletal: no clubbing / cyanosis. No joint deformity upper and lower extremities. Normal muscle tone.  Skin: no significant rashes, lesions, ulcers. Warm, dry, well-perfused. Neurologic: CN 2-12 grossly intact. Sensation intact, DTR normal. Strength 5/5 in all 4 limbs. Course tremor involving b/l UEs.  Psychiatric: Alert and oriented x 3. Anxious, seeing objects and people in room that are not seen by others.     Labs on Admission: I have personally reviewed following labs and imaging studies  CBC:  Recent Labs Lab 08/13/16 1351 08/13/16 1436  WBC 12.1*  --   HGB 13.2 13.3  HCT 37.2* 39.0  MCV 104.8*  --   PLT 234  --    Basic Metabolic Panel:  Recent Labs Lab 08/13/16 1351 08/13/16 1436  NA 134* 134*  K 4.0 3.9  CL 97* 95*  CO2 25  --   GLUCOSE  113* 114*  BUN 11 12  CREATININE 0.88 1.10  CALCIUM 10.0  --    GFR: Estimated Creatinine Clearance: 105.8 mL/min (by C-G formula based on SCr of 1.1 mg/dL). Liver Function Tests:  Recent Labs Lab 08/13/16 1351  AST 109*  ALT 66*  ALKPHOS 122  BILITOT 1.2  PROT 6.6  ALBUMIN 3.2*    Recent Labs Lab 08/13/16 1603  LIPASE 41   No results for input(s): AMMONIA in the last 168 hours. Coagulation Profile: No results for input(s): INR, PROTIME in the last 168 hours. Cardiac Enzymes: No results for input(s): CKTOTAL, CKMB, CKMBINDEX, TROPONINI in the last 168 hours. BNP (last 3 results) No results for input(s): PROBNP in the last 8760 hours. HbA1C: No results for input(s): HGBA1C in the last 72 hours. CBG: No results for input(s): GLUCAP in the last 168 hours. Lipid Profile: No results for input(s): CHOL, HDL, LDLCALC, TRIG, CHOLHDL, LDLDIRECT in the last 72 hours. Thyroid Function Tests: No results for input(s): TSH, T4TOTAL, FREET4, T3FREE, THYROIDAB in the last 72 hours. Anemia Panel: No results for input(s): VITAMINB12, FOLATE, FERRITIN, TIBC, IRON, RETICCTPCT in the last 72 hours. Urine analysis:    Component Value Date/Time   COLORURINE STRAW (A) 08/13/2016 1555   APPEARANCEUR CLEAR 08/13/2016 1555   LABSPEC 1.004 (L) 08/13/2016 1555   PHURINE 7.0 08/13/2016 1555   GLUCOSEU NEGATIVE 08/13/2016 1555   HGBUR NEGATIVE 08/13/2016 [place] 08/13/2016 [place] 08/13/2016 1555   PROTEINUR NEGATIVE 08/13/2016 1555   NITRITE NEGATIVE 08/13/2016 1555   LEUKOCYTESUR NEGATIVE 08/13/2016 1555   Sepsis Labs: _0 (procalcitonin:4,lacticidven:4) )No results found for this or any previous visit (from the past 240 hour(s)).   Radiological Exams on Admission: Dg Chest 2 View  Result Date: 08/13/2016 CLINICAL DATA:  Hypotension, lightheadedness x1 day. Pt denies SOB and CP. Hx of HTN, DM, CHF, asthma, s/p AICD, PNA. Nonsmoker. EXAM:  CHEST  2 VIEW COMPARISON:  12/08/2015 FINDINGS: Left-sided transvenous pacemaker lead to the right ventricle. Heart size is normal. The lungs are free of focal consolidations and pleural effusions. No pulmonary edema. Remote rib fractures. IMPRESSION: No evidence for acute  abnormality. Electronically Signed   By: Nolon Nations M.D.   On: 08/13/2016 16:52   Ct Angio Abd/pel W And/or Wo Contrast  Result Date: 08/13/2016 CLINICAL  DATA:  Abdominal pain. EXAM: CTA ABDOMEN AND PELVIS wITHOUT AND WITH CONTRAST TECHNIQUE: Multidetector CT imaging of the abdomen and pelvis was performed using the standard protocol during bolus administration of intravenous contrast. Multiplanar reconstructed images and MIPs were obtained and reviewed to evaluate the vascular anatomy. CONTRAST:  100 mL of Isovue 370 intravenously. COMPARISON:  CT scan of December 04, 2005. FINDINGS: VASCULAR Aorta: Normal caliber aorta without aneurysm, dissection, vasculitis or significant stenosis. Celiac: Patent without evidence of aneurysm, dissection, vasculitis or significant stenosis. SMA: Patent without evidence of aneurysm, dissection, vasculitis or significant stenosis. Renals: Both renal arteries are patent without evidence of aneurysm, dissection, vasculitis, fibromuscular dysplasia or significant stenosis. IMA: Patent without evidence of aneurysm, dissection, vasculitis or significant stenosis. Inflow: Patent without evidence of aneurysm, dissection, vasculitis or significant stenosis. Proximal Outflow: Bilateral common femoral and visualized portions of the superficial and profunda femoral arteries are patent without evidence of aneurysm, dissection, vasculitis or significant stenosis. Veins: No obvious venous abnormality within the limitations of this arterial phase study. Review of the MIP images confirms the above findings. NON-VASCULAR Lower chest: No acute abnormality. Hepatobiliary: Fatty infiltration of the liver is noted. No  gallstones are noted. Pancreas: Unremarkable. No pancreatic ductal dilatation or surrounding inflammatory changes. Spleen: Normal in size without focal abnormality. Adrenals/Urinary Tract: Adrenal glands appear normal. Bilateral nephrolithiasis is noted. Largest calculus measures 27 x 17 mm in left renal pelvis and upper infundibulum. This results in obstruction of the left upper pole collecting system. No ureteral calculus is noted. Stomach/Bowel: Stomach is within normal limits. Appendix appears normal. No evidence of bowel wall thickening, distention, or inflammatory changes. Lymphatic: No significant adenopathy is noted. Reproductive: Prostatic calcifications are noted. Other: No abdominal wall hernia or abnormality. No abdominopelvic ascites. Musculoskeletal: No acute or significant osseous findings. IMPRESSION: VASCULAR No evidence of significant mesenteric or renal artery stenosis. No evidence of abdominal aortic aneurysm or dissection. NON-VASCULAR Fatty infiltration the liver. Bilateral nephrolithiasis is noted. 27 x 17 mm calculus is noted in left renal pelvis and upper pole infundibulum, resulting in severe dilatation of left upper pole collecting system. Electronically Signed   By: Marijo Conception, M.D.   On: 08/13/2016 19:04    EKG: Independently reviewed. Sinus or ectopic atrial tachycardia (rate 101)  Assessment/Plan  1. Alcohol abuse with withdrawal, complicated - Pt initially denies any EtOH use, later endorses 1-2 drinks 3x/wk, but EtOH level is 193, family suspects heavy daily drinking, and he is actively hallucinating  - He will be admitted to stepdown unit for management of complicated withdrawal given hallucinations despite elevated EtOH level  - Monitor with CIWA and prn Ativan; provide supportive care with IVF, vitamins & minerals, anti-emetics - Social work consultation requested    2. Hypotension, elevated lactate  - Initial BP 66/53, responded well to IVF and has remained  stable since  - Lactic acid has been elevated and SIRS criteria are present, but there is no fever and no apparent source of infection  - Suspect this is related to withdrawal with vomiting and poor oral intake, exacerbated by continued use of antihypertensives and diuretics  - Blood and urine cultures are incubating and pt has received one dose each of vancomycin and cefepime in the ED; plan to hold further abx for now while following cultures and clinical course    3. Nausea, vomiting - Pt reports severe nausea, is dry-heaving in ED  - CTA abdomen notable for fatty infiltration of liver and nephrolithiasis, but no bowel  obstruction, arterial stenosis, dissection, or apparent source of infection - Abdominal exam benign; suspect this is related to EtOH withdrawal and is being addressed as above with Ativan and anti-emetics   4. Chronic systolic CHF, ?resolved   - Pt reportedly had non-ischemic cardiomyopathy with EF 10% while living in [place], had ICD placed, and was considered for heart transplant  - He has since been evaluated by cardiology in [place] with near-normalization in systolic function  - TTE (07/18/15) with EF 50-55%, mild LVH, no diastolic dysfunction, and no significant valvular disease  - Pt is dry on admission and is being hydrated  - Taking Lasix, Aldactone, and Coreg at home; these are held on admission in setting of hypotension and he may not need both of the diuretics going forward - Follow daily wts and I/Os   5. Type II DM  - A1c was 5.0% in September 2017, was 8.2% December 2016 - Managed with glipizide only at home; this will be held  - Check CBG with meals and qHS  - Start a low-intensity SSI only and adjust prn    6. Nephrolithiasis  - Pt with hx of nephrolithiasis, has passed stones spontaneously  - CTA from ED notable for bilateral nephrolithiasis, including a 27 x 17 mm stone in left renal pelvis and upper pole resulting in severe dilation of left upper  pole  - There is no abd or CVA tenderness, no urinary sxs, UA not c/w infection, and renal function remains normal and at baseline   - Will likely need non-emergent urology evaluation   DVT prophylaxis: sq Lovenox Code Status: Full  Family Communication: Ex-wife updated at bedside at patient's request Disposition Plan: Admit to stepdown Consults called: None Admission status: Inpatient    Vianne Bulls, MD [facility] Pager [phone]  If 7PM-7AM, please contact night-coverage [email or web address] Password [email or web address]  08/13/2016, 8:01 PM

## 2016-08-13 NOTE — ED Notes (Signed)
Pharmacy contacted about vancomycin

## 2016-08-13 NOTE — ED Notes (Signed)
Blood drown in triage, waiting for orders.

## 2016-08-14 DIAGNOSIS — E876 Hypokalemia: Secondary | ICD-10-CM

## 2016-08-14 DIAGNOSIS — E871 Hypo-osmolality and hyponatremia: Secondary | ICD-10-CM

## 2016-08-14 LAB — CBC WITH DIFFERENTIAL/PLATELET
BASOS ABS: 0 10*3/uL (ref 0.0–0.1)
BASOS PCT: 0 %
Eosinophils Absolute: 0.1 10*3/uL (ref 0.0–0.7)
Eosinophils Relative: 1 %
HEMATOCRIT: 31.9 % — AB (ref 39.0–52.0)
HEMOGLOBIN: 11.2 g/dL — AB (ref 13.0–17.0)
LYMPHS PCT: 6 %
Lymphs Abs: 0.5 10*3/uL — ABNORMAL LOW (ref 0.7–4.0)
MCH: 37.1 pg — ABNORMAL HIGH (ref 26.0–34.0)
MCHC: 35.1 g/dL (ref 30.0–36.0)
MCV: 105.6 fL — AB (ref 78.0–100.0)
MONO ABS: 0.7 10*3/uL (ref 0.1–1.0)
Monocytes Relative: 8 %
NEUTROS ABS: 7.1 10*3/uL (ref 1.7–7.7)
NEUTROS PCT: 85 %
Platelets: 171 10*3/uL (ref 150–400)
RBC: 3.02 MIL/uL — AB (ref 4.22–5.81)
RDW: 15 % (ref 11.5–15.5)
WBC: 8.4 10*3/uL (ref 4.0–10.5)

## 2016-08-14 LAB — URINE CULTURE

## 2016-08-14 LAB — COMPREHENSIVE METABOLIC PANEL
ALBUMIN: 2.6 g/dL — AB (ref 3.5–5.0)
ALK PHOS: 100 U/L (ref 38–126)
ALT: 51 U/L (ref 17–63)
ANION GAP: 12 (ref 5–15)
AST: 100 U/L — ABNORMAL HIGH (ref 15–41)
BILIRUBIN TOTAL: 1.5 mg/dL — AB (ref 0.3–1.2)
BUN: 12 mg/dL (ref 6–20)
CALCIUM: 8 mg/dL — AB (ref 8.9–10.3)
CO2: 20 mmol/L — ABNORMAL LOW (ref 22–32)
Chloride: 101 mmol/L (ref 101–111)
Creatinine, Ser: 0.91 mg/dL (ref 0.61–1.24)
Glucose, Bld: 77 mg/dL (ref 65–99)
POTASSIUM: 3.2 mmol/L — AB (ref 3.5–5.1)
Sodium: 133 mmol/L — ABNORMAL LOW (ref 135–145)
TOTAL PROTEIN: 5.5 g/dL — AB (ref 6.5–8.1)

## 2016-08-14 LAB — MRSA PCR SCREENING: MRSA BY PCR: NEGATIVE

## 2016-08-14 LAB — LACTIC ACID, PLASMA: Lactic Acid, Venous: 2.5 mmol/L (ref 0.5–1.9)

## 2016-08-14 LAB — GLUCOSE, CAPILLARY
Glucose-Capillary: 75 mg/dL (ref 65–99)
Glucose-Capillary: 86 mg/dL (ref 65–99)
Glucose-Capillary: 105 mg/dL — ABNORMAL HIGH (ref 65–99)
Glucose-Capillary: 91 mg/dL (ref 65–99)

## 2016-08-14 LAB — MAGNESIUM: Magnesium: 1.2 mg/dL — ABNORMAL LOW (ref 1.7–2.4)

## 2016-08-14 MED ORDER — MAGNESIUM SULFATE 2 GM/50ML IV SOLN
2.0000 g | Freq: Once | INTRAVENOUS | Status: AC
  Administered 2016-08-14: 2 g via INTRAVENOUS
  Filled 2016-08-14: qty 50

## 2016-08-14 MED ORDER — PNEUMOCOCCAL VAC POLYVALENT 25 MCG/0.5ML IJ INJ: 0.5000 mL | INJECTION | INTRAMUSCULAR | Status: DC | PRN

## 2016-08-14 MED ORDER — FOLIC ACID 1 MG PO TABS
1.0000 mg | ORAL_TABLET | Freq: Every day | ORAL | Status: DC
  Administered 2016-08-15 – 2016-08-21 (×7): 1 mg via ORAL
  Filled 2016-08-14 (×7): qty 1

## 2016-08-14 MED ORDER — SODIUM CHLORIDE 0.9 % IV BOLUS (SEPSIS)
250.0000 mL | Freq: Once | INTRAVENOUS | Status: AC
  Administered 2016-08-14: 250 mL via INTRAVENOUS

## 2016-08-14 MED ORDER — INFLUENZA VAC SPLIT QUAD 0.5 ML IM SUSY: 0.5000 mL | PREFILLED_SYRINGE | INTRAMUSCULAR | Status: DC | PRN

## 2016-08-14 MED ORDER — POTASSIUM CHLORIDE CRYS ER 20 MEQ PO TBCR
40.0000 meq | EXTENDED_RELEASE_TABLET | Freq: Two times a day (BID) | ORAL | Status: DC
  Administered 2016-08-14 – 2016-08-16 (×5): 40 meq via ORAL
  Filled 2016-08-14 (×5): qty 2

## 2016-08-14 MED ORDER — VITAMIN B-1 100 MG PO TABS
100.0000 mg | ORAL_TABLET | Freq: Every day | ORAL | Status: DC
  Administered 2016-08-15 – 2016-08-18 (×4): 100 mg via ORAL
  Filled 2016-08-14 (×4): qty 1

## 2016-08-14 NOTE — Progress Notes (Signed)
PROGRESS NOTE    Justin Bernard  WUJ:[id number] DOB: August 29, 1960 DOA: 08/13/2016 PCP: No PCP Per Patient    Brief Narrative:  Justin Bernard is a 55 y.o. male with medical history significant for hypertension, essential tremor, nephrolithiasis, and chronic systolic CHF who presents to the emergency department with tremors, nausea, vomiting, and malaise. Patient reports that he suffers from essential tremor, but this has been much worse over the past couple days, over which time he is also developed nausea with dry heaving. The patient's ex-wife visited him today to discuss something about their children and noted him to look ill, dry heaving, diaphoretic, and shaking. She brought him into the emergency department for further evaluation of this. The patient initially denied any significant alcohol use, but later endorsed very mild consumption. He gave permission to discuss care with his ex-wife, who reports a strong suspicion for chronic daily heavy drinking for the past couple years at least. Patient denies any recent fevers or chills, denies rhinorrhea or sore throat, denies chest pain or palpitations, and denies dyspnea or cough.  ED Course: Upon arrival to the ED, patient is found to be afebrile, saturating well on room air, tachycardic to 110s, and hypotensive to 66/50. EKG reveals a sinus or possibly ectopic atrial tachycardia with rate 101. Chest x-ray is negative for acute cardiopulmonary disease. Chemistry panels notable for mild hyponatremia and hypochloremia, and mild elevations in serum transaminases. Ethanol level is elevated 293, UDS is negative, and urinalysis is unremarkable. CBC is notable for mild leukocytosis to 12,100 and macrocytosis with MCV of 104.8, but no anemia. Lactic acid is elevated to 3.64 initially, then down to 3.2, before rising again up to 4.5. Blood and urine cultures were obtained in the ED and patient was given 2 l of normal saline. He was treated empirically with  vancomycin and cefepime and given multiple doses of IV Ativan for withdrawal symptoms. Patient was experiencing visual hallucinations in the emergency department consistent with withdrawal. Blood pressure responded very well to IV fluids, but patient remains mildly tachycardic. He will be admitted to the stepdown unit for ongoing evaluation and management of suspected complicated alcohol withdrawal.   Assessment & Plan:   Principal Problem:   Alcohol withdrawal delirium (HCC) Active Problems:   Hypotension   Chronic systolic CHF (congestive heart failure), NYHA class 2 (HCC)   Hyponatremia   Elevated serum lactate dehydrogenase   Nausea & vomiting   SIRS (systemic inflammatory response syndrome) (HCC)   Diabetes mellitus type II, non insulin dependent (HCC)   Alcohol abuse with intoxication with complication (HCC)   Alcohol withdrawal delirium, acute, hyperactive (HCC)   Alcohol abuse with withdrawal, complicated - Pt initially denies any EtOH use, later endorses 1-2 drinks 3x/wk, but EtOH level is 193, family suspects heavy daily drinking, and he is actively hallucinating  - He will be admitted to stepdown unit for management of complicated withdrawal given hallucinations despite elevated EtOH level  - Monitor with CIWA and prn Ativan; provide supportive care with IVF, vitamins & minerals, anti-emetics - Social work consultation requested   - has received IV ativan sporadically per CIWA  Hypotension, elevated lactate  - Initial BP 66/53, responded well to IVF and has remained stable since  - Lactic acid has been elevated and SIRS criteria are present, but there is no fever and no apparent source of infection  - Suspect this is related to withdrawal with vomiting and poor oral intake, exacerbated by continued use of antihypertensives and diuretics  -  Blood and urine cultures are incubating and pt has received one dose each of vancomycin and cefepime in the ED; plan to hold further abx  for now while following cultures and clinical course   - tachycardia and slight tachypnea could be explained by alcohol withdrawal  - no cultures results yet  Nausea, vomiting - Pt reports severe nausea, is dry-heaving in ED  - CTA abdomen notable for fatty infiltration of liver and nephrolithiasis, but no bowel obstruction, arterial stenosis, dissection, or apparent source of infection - Abdominal exam benign; suspect this is related to EtOH withdrawal and is being addressed as above with Ativan and anti-emetics - patient reports dry heaving this am   Hypokalemia and Hypomagnesemia - replaced today  Chronic systolic CHF, ?resolved   - Pt reportedly had non-ischemic cardiomyopathy with EF 10% while living in [place], had ICD placed, and was considered for heart transplant  - He has since been evaluated by cardiology in [place] with near-normalization in systolic function  - TTE (07/18/15) with EF 50-55%, mild LVH, no diastolic dysfunction, and no significant valvular disease  - Pt is dry on admission and is being hydrated  - Taking Lasix, Aldactone, and Coreg at home; these are held on admission in setting of hypotension and he may not need both of the diuretics going forward - blood pressure low 110s systolically - Follow daily wts and I/Os   Type II DM  - A1c was 5.0% in September 2017, was 8.2% December 2016 - Managed with glipizide only at home; this will be held  - Check CBG with meals and qHS  - Start a low-intensity SSI only and adjust prn    Nephrolithiasis  - Pt with hx of nephrolithiasis, has passed stones spontaneously  - CTA from ED notable for bilateral nephrolithiasis, including a 27 x 17 mm stone in left renal pelvis and upper pole resulting in severe dilation of left upper pole  - There is no abd or CVA tenderness, no urinary sxs, UA not c/w infection, and renal function remains normal and at baseline   -  will need non-emergent urology evaluation   DVT  prophylaxis: sq Lovenox Code Status: Full  Family Communication: Ex-wife and daughter updated Disposition Plan: Admit to stepdown, will need further close monitoring in step down unit Admission status: Inpatient     Consultants:   None  Procedures:   None  Antimicrobials:   Cefepime 12/30  Vancomycin 12/30    Subjective: Patient seen while ex- wife is bedside and daughter is on FaceTime.  Patient fluctuating between sleeping and talking and being awake.  He states he did not drink much prior to admission.  He voices that he would only drink every couple of days.  Says he wants help for his alcoholism.  Voices he was seen previously by a neurologist for evaluation of his tremor.    Objective: Vitals:   08/14/16 0600 08/14/16 0700 08/14/16 0827 08/14/16 1136  BP: 118/75  118/75 112/83  Pulse: 99 99 99 100  Resp: (!) 23 19 (!) 24 (!) 22  Temp:   98.6 F (37 C) 98.4 F (36.9 C)  TempSrc:   Oral Oral  SpO2: 97% 97% 97% 97%  Weight:      Height: 6\' 4"  (1.93 m)       Intake/Output Summary (Last 24 hours) at 08/14/16 1304 Last data filed at 08/14/16 0655  Gross per 24 hour  Intake  4940 ml  Output              625 ml  Net             4315 ml   Filed Weights   08/13/16 1353  Weight: 113.4 kg (250 lb)    Examination:  General exam: Appears calm and comfortable  Respiratory system: Clear to auscultation. Respiratory effort normal. Cardiovascular system: S1 & S2 heard, RRR. No JVD, murmurs, rubs, gallops or clicks. No pedal edema. Gastrointestinal system: Abdomen is nondistended, soft and nontender. No organomegaly or masses felt. Normal bowel sounds heard. Central nervous system: Alert and oriented. Significant bilateral tremor, no focal neurologic deficiets Extremities: Symmetric 5 x 5 power. Skin: Numerous small lesions on arms, upper back, lower back, buttocks, thighs and legs noted.  Patient states they are itchy.  No lesions noted between fingers or  toes.  Ecchymosis on inner left knee noted Psychiatry: questionable insight, appropriate affect     Data Reviewed: I have personally reviewed following labs and imaging studies  CBC:  Recent Labs Lab 08/13/16 1351 08/13/16 1436 08/14/16 0218  WBC 12.1*  --  8.4  NEUTROABS  --   --  7.1  HGB 13.2 13.3 11.2*  HCT 37.2* 39.0 31.9*  MCV 104.8*  --  105.6*  PLT 234  --  171   Basic Metabolic Panel:  Recent Labs Lab 08/13/16 1351 08/13/16 1436 08/14/16 0218  NA 134* 134* 133*  K 4.0 3.9 3.2*  CL 97* 95* 101  CO2 25  --  20*  GLUCOSE 113* 114* 77  BUN 11 12 12   CREATININE 0.88 1.10 0.91  CALCIUM 10.0  --  8.0*  MG  --   --  1.2*   GFR: Estimated Creatinine Clearance: 127.8 mL/min (by C-G formula based on SCr of 0.91 mg/dL). Liver Function Tests:  Recent Labs Lab 08/13/16 1351 08/14/16 0218  AST 109* 100*  ALT 66* 51  ALKPHOS 122 100  BILITOT 1.2 1.5*  PROT 6.6 5.5*  ALBUMIN 3.2* 2.6*    Recent Labs Lab 08/13/16 1603  LIPASE 41   No results for input(s): AMMONIA in the last 168 hours. Coagulation Profile: No results for input(s): INR, PROTIME in the last 168 hours. Cardiac Enzymes: No results for input(s): CKTOTAL, CKMB, CKMBINDEX, TROPONINI in the last 168 hours. BNP (last 3 results) No results for input(s): PROBNP in the last 8760 hours. HbA1C: No results for input(s): HGBA1C in the last 72 hours. CBG:  Recent Labs Lab 08/13/16 2200 08/14/16 0830 08/14/16 1142  GLUCAP 106* 75 86   Lipid Profile: No results for input(s): CHOL, HDL, LDLCALC, TRIG, CHOLHDL, LDLDIRECT in the last 72 hours. Thyroid Function Tests: No results for input(s): TSH, T4TOTAL, FREET4, T3FREE, THYROIDAB in the last 72 hours. Anemia Panel: No results for input(s): VITAMINB12, FOLATE, FERRITIN, TIBC, IRON, RETICCTPCT in the last 72 hours. Sepsis Labs:  Recent Labs Lab 08/13/16 1603 08/13/16 1705 08/13/16 2115 08/13/16 2342  LATICACIDVEN 3.2* 4.15* 3.6* 2.5*     Recent Results (from the past 240 hour(s))  MRSA PCR Screening     Status: None   Collection Time: 08/14/16  8:01 AM  Result Value Ref Range Status   MRSA by PCR NEGATIVE NEGATIVE Final    Comment:        The GeneXpert MRSA Assay (FDA approved for NASAL specimens only), is one component of a comprehensive MRSA colonization surveillance program. It is not intended to diagnose MRSA infection nor to  guide or monitor treatment for MRSA infections.          Radiology Studies: Dg Chest 2 View  Result Date: 08/13/2016 CLINICAL DATA:  Hypotension, lightheadedness x1 day. Pt denies SOB and CP. Hx of HTN, DM, CHF, asthma, s/p AICD, PNA. Nonsmoker. EXAM: CHEST  2 VIEW COMPARISON:  12/08/2015 FINDINGS: Left-sided transvenous pacemaker lead to the right ventricle. Heart size is normal. The lungs are free of focal consolidations and pleural effusions. No pulmonary edema. Remote rib fractures. IMPRESSION: No evidence for acute  abnormality. Electronically Signed   By: Norva Pavlov M.D.   On: 08/13/2016 16:52   Ct Angio Abd/pel W And/or Wo Contrast  Result Date: 08/13/2016 CLINICAL DATA:  Abdominal pain. EXAM: CTA ABDOMEN AND PELVIS wITHOUT AND WITH CONTRAST TECHNIQUE: Multidetector CT imaging of the abdomen and pelvis was performed using the standard protocol during bolus administration of intravenous contrast. Multiplanar reconstructed images and MIPs were obtained and reviewed to evaluate the vascular anatomy. CONTRAST:  100 mL of Isovue 370 intravenously. COMPARISON:  CT scan of December 04, 2005. FINDINGS: VASCULAR Aorta: Normal caliber aorta without aneurysm, dissection, vasculitis or significant stenosis. Celiac: Patent without evidence of aneurysm, dissection, vasculitis or significant stenosis. SMA: Patent without evidence of aneurysm, dissection, vasculitis or significant stenosis. Renals: Both renal arteries are patent without evidence of aneurysm, dissection, vasculitis,  fibromuscular dysplasia or significant stenosis. IMA: Patent without evidence of aneurysm, dissection, vasculitis or significant stenosis. Inflow: Patent without evidence of aneurysm, dissection, vasculitis or significant stenosis. Proximal Outflow: Bilateral common femoral and visualized portions of the superficial and profunda femoral arteries are patent without evidence of aneurysm, dissection, vasculitis or significant stenosis. Veins: No obvious venous abnormality within the limitations of this arterial phase study. Review of the MIP images confirms the above findings. NON-VASCULAR Lower chest: No acute abnormality. Hepatobiliary: Fatty infiltration of the liver is noted. No gallstones are noted. Pancreas: Unremarkable. No pancreatic ductal dilatation or surrounding inflammatory changes. Spleen: Normal in size without focal abnormality. Adrenals/Urinary Tract: Adrenal glands appear normal. Bilateral nephrolithiasis is noted. Largest calculus measures 27 x 17 mm in left renal pelvis and upper infundibulum. This results in obstruction of the left upper pole collecting system. No ureteral calculus is noted. Stomach/Bowel: Stomach is within normal limits. Appendix appears normal. No evidence of bowel wall thickening, distention, or inflammatory changes. Lymphatic: No significant adenopathy is noted. Reproductive: Prostatic calcifications are noted. Other: No abdominal wall hernia or abnormality. No abdominopelvic ascites. Musculoskeletal: No acute or significant osseous findings. IMPRESSION: VASCULAR No evidence of significant mesenteric or renal artery stenosis. No evidence of abdominal aortic aneurysm or dissection. NON-VASCULAR Fatty infiltration the liver. Bilateral nephrolithiasis is noted. 27 x 17 mm calculus is noted in left renal pelvis and upper pole infundibulum, resulting in severe dilatation of left upper pole collecting system. Electronically Signed   By: Lupita Raider, M.D.   On: 08/13/2016 19:04         Scheduled Meds: . enoxaparin (LOVENOX) injection  40 mg Subcutaneous Q24H  . folic acid  1 mg Intravenous Daily  . insulin aspart  0-5 Units Subcutaneous QHS  . insulin aspart  0-9 Units Subcutaneous TID WC  . magnesium sulfate 1 - 4 g bolus IVPB  2 g Intravenous Once  . multivitamin with minerals  1 tablet Oral Daily  . potassium chloride  40 mEq Oral BID  . sodium chloride flush  3 mL Intravenous Q12H  . thiamine  100 mg Intravenous Daily   Continuous Infusions:  LOS: 1 day    Time spent: 35 minutes    Katrinka Blazing, MD [facility] Pager [phone]  If 7PM-7AM, please contact night-coverage [email or web address] Password [email or web address] 08/14/2016, 1:04 PM

## 2016-08-15 LAB — COMPREHENSIVE METABOLIC PANEL
ALT: 48 U/L (ref 17–63)
ANION GAP: 13 (ref 5–15)
AST: 103 U/L — ABNORMAL HIGH (ref 15–41)
Albumin: 2.7 g/dL — ABNORMAL LOW (ref 3.5–5.0)
Alkaline Phosphatase: 103 U/L (ref 38–126)
BUN: 9 mg/dL (ref 6–20)
CALCIUM: 7.5 mg/dL — AB (ref 8.9–10.3)
CO2: 19 mmol/L — AB (ref 22–32)
Chloride: 100 mmol/L — ABNORMAL LOW (ref 101–111)
Creatinine, Ser: 0.78 mg/dL (ref 0.61–1.24)
Glucose, Bld: 194 mg/dL — ABNORMAL HIGH (ref 65–99)
Potassium: 3.5 mmol/L (ref 3.5–5.1)
SODIUM: 132 mmol/L — AB (ref 135–145)
TOTAL PROTEIN: 5.4 g/dL — AB (ref 6.5–8.1)
Total Bilirubin: 1.2 mg/dL (ref 0.3–1.2)

## 2016-08-15 LAB — CBC WITH DIFFERENTIAL/PLATELET
Basophils Absolute: 0 10*3/uL (ref 0.0–0.1)
Basophils Relative: 1 %
EOS ABS: 0.6 10*3/uL (ref 0.0–0.7)
EOS PCT: 11 %
HCT: 31.4 % — ABNORMAL LOW (ref 39.0–52.0)
Hemoglobin: 11.1 g/dL — ABNORMAL LOW (ref 13.0–17.0)
LYMPHS ABS: 0.7 10*3/uL (ref 0.7–4.0)
LYMPHS PCT: 13 %
MCH: 37.6 pg — AB (ref 26.0–34.0)
MCHC: 35.4 g/dL (ref 30.0–36.0)
MCV: 106.4 fL — AB (ref 78.0–100.0)
MONOS PCT: 11 %
Monocytes Absolute: 0.6 10*3/uL (ref 0.1–1.0)
Neutro Abs: 3.7 10*3/uL (ref 1.7–7.7)
Neutrophils Relative %: 64 %
PLATELETS: 177 10*3/uL (ref 150–400)
RBC: 2.95 MIL/uL — ABNORMAL LOW (ref 4.22–5.81)
RDW: 14.9 % (ref 11.5–15.5)
WBC: 5.7 10*3/uL (ref 4.0–10.5)

## 2016-08-15 LAB — GLUCOSE, CAPILLARY
GLUCOSE-CAPILLARY: 103 mg/dL — AB (ref 65–99)
GLUCOSE-CAPILLARY: 102 mg/dL — AB (ref 65–99)
Glucose-Capillary: 112 mg/dL — ABNORMAL HIGH (ref 65–99)
Glucose-Capillary: 137 mg/dL — ABNORMAL HIGH (ref 65–99)

## 2016-08-15 LAB — BASIC METABOLIC PANEL
Anion gap: 7 (ref 5–15)
BUN: 8 mg/dL (ref 6–20)
CALCIUM: 7.8 mg/dL — AB (ref 8.9–10.3)
CO2: 23 mmol/L (ref 22–32)
CREATININE: 0.76 mg/dL (ref 0.61–1.24)
Chloride: 101 mmol/L (ref 101–111)
GFR calc Af Amer: >60 mL/min (ref >60)
GFR calc non Af Amer: >60 mL/min (ref >60)
Glucose, Bld: 165 mg/dL — ABNORMAL HIGH (ref 65–99)
Potassium: 3.4 mmol/L — ABNORMAL LOW (ref 3.5–5.1)
SODIUM: 131 mmol/L — AB (ref 135–145)

## 2016-08-15 LAB — MAGNESIUM: MAGNESIUM: 1.4 mg/dL — AB (ref 1.7–2.4)

## 2016-08-15 MED ORDER — DIPHENHYDRAMINE HCL 25 MG PO CAPS
25.0000 mg | ORAL_CAPSULE | ORAL | Status: DC | PRN
  Administered 2016-08-17 – 2016-08-20 (×11): 25 mg via ORAL
  Filled 2016-08-15 (×11): qty 1

## 2016-08-15 MED ORDER — CARVEDILOL 6.25 MG PO TABS
6.2500 mg | ORAL_TABLET | Freq: Every day | ORAL | Status: DC
  Administered 2016-08-15 – 2016-08-17 (×3): 6.25 mg via ORAL
  Filled 2016-08-15 (×4): qty 1

## 2016-08-15 MED ORDER — DIPHENHYDRAMINE HCL 25 MG PO CAPS
50.0000 mg | ORAL_CAPSULE | Freq: Once | ORAL | Status: AC
  Administered 2016-08-15: 50 mg via ORAL
  Filled 2016-08-15: qty 2

## 2016-08-15 MED ORDER — FUROSEMIDE 20 MG PO TABS
20.0000 mg | ORAL_TABLET | Freq: Every day | ORAL | Status: DC
  Administered 2016-08-15 – 2016-08-21 (×7): 20 mg via ORAL
  Filled 2016-08-15 (×7): qty 1

## 2016-08-15 MED ORDER — MAGNESIUM SULFATE 4 GM/100ML IV SOLN
4.0000 g | Freq: Once | INTRAVENOUS | Status: AC
  Administered 2016-08-15: 4 g via INTRAVENOUS
  Filled 2016-08-15: qty 100

## 2016-08-15 NOTE — Progress Notes (Addendum)
Pt with bright red blood with clots from penis. Noticed bleeding during while voiding, pt was still able to empty bladder. Denied any pain. States "last time this happened was when I had a kidney stone." Dr. Hinda Kehr paged.   Ordered to monitor for now.

## 2016-08-15 NOTE — Progress Notes (Signed)
IV to lt [facility] noted to be lying in bed beside pt. Unknown how IV was removed

## 2016-08-15 NOTE — Progress Notes (Signed)
PROGRESS NOTE    Justin Bernard  ZOX:[id number] DOB: 1960-10-22 DOA: 08/13/2016 PCP: No PCP Per Patient    Brief Narrative:  Justin Bernard is a 56 y.o. male with medical history significant for hypertension, essential tremor, nephrolithiasis, and chronic systolic CHF who presents to the emergency department with tremors, nausea, vomiting, and malaise. Patient reports that he suffers from essential tremor, but this has been much worse over the past couple days, over which time he is also developed nausea with dry heaving. The patient's ex-wife visited him today to discuss something about their children and noted him to look ill, dry heaving, diaphoretic, and shaking. She brought him into the emergency department for further evaluation of this. The patient initially denied any significant alcohol use, but later endorsed very mild consumption. He gave permission to discuss care with his ex-wife, who reports a strong suspicion for chronic daily heavy drinking for the past couple years at least. Patient denies any recent fevers or chills, denies rhinorrhea or sore throat, denies chest pain or palpitations, and denies dyspnea or cough.  ED Course: Upon arrival to the ED, patient is found to be afebrile, saturating well on room air, tachycardic to 110s, and hypotensive to 66/50. EKG reveals a sinus or possibly ectopic atrial tachycardia with rate 101. Chest x-ray is negative for acute cardiopulmonary disease. Chemistry panels notable for mild hyponatremia and hypochloremia, and mild elevations in serum transaminases. Ethanol level is elevated 293, UDS is negative, and urinalysis is unremarkable. CBC is notable for mild leukocytosis to 12,100 and macrocytosis with MCV of 104.8, but no anemia. Lactic acid is elevated to 3.64 initially, then down to 3.2, before rising again up to 4.5. Blood and urine cultures were obtained in the ED and patient was given 2 l of normal saline. He was treated empirically with  vancomycin and cefepime and given multiple doses of IV Ativan for withdrawal symptoms. Patient was experiencing visual hallucinations in the emergency department consistent with withdrawal. Blood pressure responded very well to IV fluids, but patient remains mildly tachycardic. He will be admitted to the stepdown unit for ongoing evaluation and management of suspected complicated alcohol withdrawal.   Assessment & Plan:   Principal Problem:   Alcohol withdrawal delirium (HCC) Active Problems:   Hypotension   Chronic systolic CHF (congestive heart failure), NYHA class 2 (HCC)   Hyponatremia   Elevated serum lactate dehydrogenase   Nausea & vomiting   SIRS (systemic inflammatory response syndrome) (HCC)   Diabetes mellitus type II, non insulin dependent (HCC)   Alcohol abuse with intoxication with complication (HCC)   Alcohol withdrawal delirium, acute, hyperactive (HCC)   Alcohol abuse with withdrawal, complicated - Pt initially denies any EtOH use, later endorses 1-2 drinks 3x/wk, but EtOH level is 193, family suspects heavy daily drinking - withdrawals better controlled today - continue with CIWA (last two scores of 3 and 8) - provide supportive care with IVF, vitamins & minerals, anti-emetics - Social work consultation requested   - has received IV ativan twice today  Hypotension, elevated lactate  - Initial BP 66/53, responded well to IVF and has remained stable since  - Lactic acid has been elevated and SIRS criteria are present, but there is no fever and no apparent source of infection  - Suspect this is related to withdrawal with vomiting and poor oral intake, exacerbated by continued use of antihypertensives and diuretics  - blood pressure WNL today - Blood cultures showing no growth <24 hours - Urine culture shows  insufficient growth  - pt received one dose each of vancomycin and cefepime in the ED; plan to hold further abx for now while following cultures and clinical  course   - tachycardia could be explained by alcohol withdrawal as well as patient being off his coreg  Nausea, vomiting - Pt controlled today - CTA abdomen notable for fatty infiltration of liver and nephrolithiasis, but no bowel obstruction, arterial stenosis, dissection, or apparent source of infection - Abdominal exam benign; suspect this is related to EtOH withdrawal and is being addressed as above with Ativan and anti-emetics   Hypokalemia and Hypomagnesemia - replaced again today as low again today (K of 3.4 and Mg of 1.4)  Chronic systolic CHF, ?resolved   - Pt reportedly had non-ischemic cardiomyopathy with EF 10% while living in [place], had ICD placed, and was considered for heart transplant  - He has since been evaluated by cardiology in [place] with near-normalization in systolic function  - TTE (07/18/15) with EF 50-55%, mild LVH, no diastolic dysfunction, and no significant valvular disease  - Taking Lasix, Aldactone, and Coreg at home - blood pressure low 110s systolically - restarting Coreg today and lasix can consider restarting aldactone pending patient's blood pressure - Follow daily wts - I/Os   Type II DM  - A1c was 5.0% in September 2017, was 8.2% December 2016 - Managed with glipizide only at home; this will be held  - Check CBG with meals and qHS  - Start a low-intensity SSI only and adjust prn    Nephrolithiasis  - Pt with hx of nephrolithiasis, has passed stones spontaneously  - CTA from ED notable for bilateral nephrolithiasis, including a 27 x 17 mm stone in left renal pelvis and upper pole resulting in severe dilation of left upper pole  - There is no abd or CVA tenderness, no urinary sxs, UA not c/w infection, and renal function remains normal and at baseline   -  will need non-emergent urology evaluation   DVT prophylaxis: sq Lovenox Code Status: Full  Family Communication: Ex-wife, sister, nephew and daughter updated per patient's  request Disposition Plan: Admit to stepdown Admission status: Inpatient     Consultants:   None  Procedures:   None  Antimicrobials:   Cefepime 12/30  Vancomycin 12/30    Subjective: Patient seen this am after breakfast.  States he feels better today and voices his tremor is 60% better than it has been.  He is awake, alert and oriented.  He denies any nausea, vomiting, diarrhea.  Denies headache, chest pain, chest pressure, dysuria, hematuria.  No other acute overnight events noted.   Objective: Vitals:   08/15/16 0400 08/15/16 0531 08/15/16 0700 08/15/16 0800  BP: 127/84 107/66 108/82   Pulse:  96 98 (!) 101  Resp: 20   (!) 22  Temp:  98.3 F (36.8 C) 98.6 F (37 C)   TempSrc:  Oral Oral   SpO2:  100%  96%  Weight:  115.9 kg (255 lb 8 oz)    Height:        Intake/Output Summary (Last 24 hours) at 08/15/16 0956 Last data filed at 08/15/16 0600  Gross per 24 hour  Intake             1563 ml  Output                0 ml  Net             1563 ml  Filed Weights   08/13/16 1353 08/15/16 0531  Weight: 113.4 kg (250 lb) 115.9 kg (255 lb 8 oz)    Examination:  General exam: Appears calm and comfortable  Respiratory system: Clear to auscultation. Respiratory effort normal. Cardiovascular system: S1 & S2 heard, RRR. No JVD, murmurs, rubs, gallops or clicks. No pedal edema. Gastrointestinal system: Abdomen is nondistended, soft and nontender. No organomegaly or masses felt. Normal bowel sounds heard. Central nervous system: Alert and oriented. Significant bilateral tremor, no focal neurologic deficiets Extremities: Symmetric 5 x 5 power. Skin: Numerous small lesions on arms, upper back, lower back, buttocks, thighs and legs noted.  Patient states they are itchy.  No lesions noted between fingers or toes.  Ecchymosis on inner left knee noted Psychiatry: more appropriate insight, appropriate affect     Data Reviewed: I have personally reviewed following labs and  imaging studies  CBC:  Recent Labs Lab 08/13/16 1351 08/13/16 1436 08/14/16 0218  WBC 12.1*  --  8.4  NEUTROABS  --   --  7.1  HGB 13.2 13.3 11.2*  HCT 37.2* 39.0 31.9*  MCV 104.8*  --  105.6*  PLT 234  --  171   Basic Metabolic Panel:  Recent Labs Lab 08/13/16 1351 08/13/16 1436 08/14/16 0218 08/15/16 0851  NA 134* 134* 133* 131*  K 4.0 3.9 3.2* 3.4*  CL 97* 95* 101 101  CO2 25  --  20* 23  GLUCOSE 113* 114* 77 165*  BUN 11 12 12 8   CREATININE 0.88 1.10 0.91 0.76  CALCIUM 10.0  --  8.0* 7.8*  MG  --   --  1.2* 1.4*   GFR: Estimated Creatinine Clearance: 146.9 mL/min (by C-G formula based on SCr of 0.76 mg/dL). Liver Function Tests:  Recent Labs Lab 08/13/16 1351 08/14/16 0218  AST 109* 100*  ALT 66* 51  ALKPHOS 122 100  BILITOT 1.2 1.5*  PROT 6.6 5.5*  ALBUMIN 3.2* 2.6*    Recent Labs Lab 08/13/16 1603  LIPASE 41   No results for input(s): AMMONIA in the last 168 hours. Coagulation Profile: No results for input(s): INR, PROTIME in the last 168 hours. Cardiac Enzymes: No results for input(s): CKTOTAL, CKMB, CKMBINDEX, TROPONINI in the last 168 hours. BNP (last 3 results) No results for input(s): PROBNP in the last 8760 hours. HbA1C: No results for input(s): HGBA1C in the last 72 hours. CBG:  Recent Labs Lab 08/14/16 0830 08/14/16 1142 08/14/16 1637 08/14/16 2134 08/15/16 0759  GLUCAP 75 86 105* 91 103*   Lipid Profile: No results for input(s): CHOL, HDL, LDLCALC, TRIG, CHOLHDL, LDLDIRECT in the last 72 hours. Thyroid Function Tests: No results for input(s): TSH, T4TOTAL, FREET4, T3FREE, THYROIDAB in the last 72 hours. Anemia Panel: No results for input(s): VITAMINB12, FOLATE, FERRITIN, TIBC, IRON, RETICCTPCT in the last 72 hours. Sepsis Labs:  Recent Labs Lab 08/13/16 1603 08/13/16 1705 08/13/16 2115 08/13/16 2342  LATICACIDVEN 3.2* 4.15* 3.6* 2.5*    Recent Results (from the past 240 hour(s))  Urine culture     Status:  Abnormal   Collection Time: 08/13/16  3:08 PM  Result Value Ref Range Status   Specimen Description URINE, RANDOM  Final   Special Requests NONE  Final   Culture <10,000 COLONIES/mL INSIGNIFICANT GROWTH (A)  Final   Report Status 08/14/2016 FINAL  Final  Blood culture (routine x 2)     Status: None (Preliminary result)   Collection Time: 08/13/16  3:11 PM  Result Value Ref Range Status  Specimen Description BLOOD RIGHT HAND  Final   Special Requests BOTTLES DRAWN AEROBIC AND ANAEROBIC 5CC  Final   Culture NO GROWTH < 24 HOURS  Final   Report Status PENDING  Incomplete  Blood culture (routine x 2)     Status: None (Preliminary result)   Collection Time: 08/13/16  6:15 PM  Result Value Ref Range Status   Specimen Description BLOOD LEFT ANTECUBITAL  Final   Special Requests BOTTLES DRAWN AEROBIC AND ANAEROBIC 5CC  Final   Culture NO GROWTH < 24 HOURS  Final   Report Status PENDING  Incomplete  MRSA PCR Screening     Status: None   Collection Time: 08/14/16  8:01 AM  Result Value Ref Range Status   MRSA by PCR NEGATIVE NEGATIVE Final    Comment:        The GeneXpert MRSA Assay (FDA approved for NASAL specimens only), is one component of a comprehensive MRSA colonization surveillance program. It is not intended to diagnose MRSA infection nor to guide or monitor treatment for MRSA infections.          Radiology Studies: Dg Chest 2 View  Result Date: 08/13/2016 CLINICAL DATA:  Hypotension, lightheadedness x1 day. Pt denies SOB and CP. Hx of HTN, DM, CHF, asthma, s/p AICD, PNA. Nonsmoker. EXAM: CHEST  2 VIEW COMPARISON:  12/08/2015 FINDINGS: Left-sided transvenous pacemaker lead to the right ventricle. Heart size is normal. The lungs are free of focal consolidations and pleural effusions. No pulmonary edema. Remote rib fractures. IMPRESSION: No evidence for acute  abnormality. Electronically Signed   By: Norva Pavlov M.D.   On: 08/13/2016 16:52   Ct Angio Abd/pel W And/or  Wo Contrast  Result Date: 08/13/2016 CLINICAL DATA:  Abdominal pain. EXAM: CTA ABDOMEN AND PELVIS wITHOUT AND WITH CONTRAST TECHNIQUE: Multidetector CT imaging of the abdomen and pelvis was performed using the standard protocol during bolus administration of intravenous contrast. Multiplanar reconstructed images and MIPs were obtained and reviewed to evaluate the vascular anatomy. CONTRAST:  100 mL of Isovue 370 intravenously. COMPARISON:  CT scan of December 04, 2005. FINDINGS: VASCULAR Aorta: Normal caliber aorta without aneurysm, dissection, vasculitis or significant stenosis. Celiac: Patent without evidence of aneurysm, dissection, vasculitis or significant stenosis. SMA: Patent without evidence of aneurysm, dissection, vasculitis or significant stenosis. Renals: Both renal arteries are patent without evidence of aneurysm, dissection, vasculitis, fibromuscular dysplasia or significant stenosis. IMA: Patent without evidence of aneurysm, dissection, vasculitis or significant stenosis. Inflow: Patent without evidence of aneurysm, dissection, vasculitis or significant stenosis. Proximal Outflow: Bilateral common femoral and visualized portions of the superficial and profunda femoral arteries are patent without evidence of aneurysm, dissection, vasculitis or significant stenosis. Veins: No obvious venous abnormality within the limitations of this arterial phase study. Review of the MIP images confirms the above findings. NON-VASCULAR Lower chest: No acute abnormality. Hepatobiliary: Fatty infiltration of the liver is noted. No gallstones are noted. Pancreas: Unremarkable. No pancreatic ductal dilatation or surrounding inflammatory changes. Spleen: Normal in size without focal abnormality. Adrenals/Urinary Tract: Adrenal glands appear normal. Bilateral nephrolithiasis is noted. Largest calculus measures 27 x 17 mm in left renal pelvis and upper infundibulum. This results in obstruction of the left upper pole  collecting system. No ureteral calculus is noted. Stomach/Bowel: Stomach is within normal limits. Appendix appears normal. No evidence of bowel wall thickening, distention, or inflammatory changes. Lymphatic: No significant adenopathy is noted. Reproductive: Prostatic calcifications are noted. Other: No abdominal wall hernia or abnormality. No abdominopelvic ascites. Musculoskeletal: No  acute or significant osseous findings. IMPRESSION: VASCULAR No evidence of significant mesenteric or renal artery stenosis. No evidence of abdominal aortic aneurysm or dissection. NON-VASCULAR Fatty infiltration the liver. Bilateral nephrolithiasis is noted. 27 x 17 mm calculus is noted in left renal pelvis and upper pole infundibulum, resulting in severe dilatation of left upper pole collecting system. Electronically Signed   By: Lupita Raider, M.D.   On: 08/13/2016 19:04        Scheduled Meds: . enoxaparin (LOVENOX) injection  40 mg Subcutaneous Q24H  . folic acid  1 mg Oral Daily  . furosemide  20 mg Oral Daily  . insulin aspart  0-5 Units Subcutaneous QHS  . insulin aspart  0-9 Units Subcutaneous TID WC  . magnesium sulfate 1 - 4 g bolus IVPB  4 g Intravenous Once  . multivitamin with minerals  1 tablet Oral Daily  . potassium chloride  40 mEq Oral BID  . sodium chloride flush  3 mL Intravenous Q12H  . thiamine  100 mg Oral Daily   Continuous Infusions:   LOS: 2 days    Time spent: 30 minutes    Katrinka Blazing, MD [facility] Pager [phone]  If 7PM-7AM, please contact night-coverage [email or web address] Password [email or web address] 08/15/2016, 9:56 AM

## 2016-08-16 DIAGNOSIS — R6510 Systemic inflammatory response syndrome (SIRS) of non-infectious origin without acute organ dysfunction: Secondary | ICD-10-CM

## 2016-08-16 LAB — CBC WITH DIFFERENTIAL/PLATELET
BASOS ABS: 0.1 10*3/uL (ref 0.0–0.1)
Basophils Relative: 1 %
EOS ABS: 0.7 10*3/uL (ref 0.0–0.7)
Eosinophils Relative: 11 %
HCT: 33 % — ABNORMAL LOW (ref 39.0–52.0)
Hemoglobin: 11.2 g/dL — ABNORMAL LOW (ref 13.0–17.0)
LYMPHS ABS: 0.9 10*3/uL (ref 0.7–4.0)
LYMPHS PCT: 13 %
MCH: 36.5 pg — AB (ref 26.0–34.0)
MCHC: 33.9 g/dL (ref 30.0–36.0)
MCV: 107.5 fL — ABNORMAL HIGH (ref 78.0–100.0)
Monocytes Absolute: 0.8 10*3/uL (ref 0.1–1.0)
Monocytes Relative: 13 %
NEUTROS PCT: 62 %
Neutro Abs: 3.9 10*3/uL (ref 1.7–7.7)
Platelets: 192 10*3/uL (ref 150–400)
RBC: 3.07 MIL/uL — AB (ref 4.22–5.81)
RDW: 15 % (ref 11.5–15.5)
WBC: 6.3 10*3/uL (ref 4.0–10.5)

## 2016-08-16 LAB — GLUCOSE, CAPILLARY
Glucose-Capillary: 125 mg/dL — ABNORMAL HIGH (ref 65–99)
Glucose-Capillary: 127 mg/dL — ABNORMAL HIGH (ref 65–99)
Glucose-Capillary: 134 mg/dL — ABNORMAL HIGH (ref 65–99)
Glucose-Capillary: 116 mg/dL — ABNORMAL HIGH (ref 65–99)

## 2016-08-16 MED ORDER — SPIRONOLACTONE 25 MG PO TABS
25.0000 mg | ORAL_TABLET | Freq: Every day | ORAL | Status: DC
  Administered 2016-08-16 – 2016-08-17 (×2): 25 mg via ORAL
  Filled 2016-08-16 (×3): qty 1

## 2016-08-16 NOTE — Progress Notes (Signed)
Report called to 5W. Patient is transferring to room 14. Report given to [facility].

## 2016-08-16 NOTE — Progress Notes (Signed)
Daughter of patient called from [place] for update. She is stating that she is to be made medical power of attorney. She would also like for the doctor to call her or for her to be on the phone with the patient when the physician rounds so that she can act as the patients advocate. She also states her mother, the patients ex-wife is not to be given power of attorney.   Ex-wife has also called to ensure that we have the chaplain come up to do the medical power of attorney. Unsure at this time if the patient is lucid enough to make that decision. RN will follow up with medical team for further orders.

## 2016-08-16 NOTE — Evaluation (Signed)
Physical Therapy Evaluation Patient Details Name: Justin Bernard MRN: [id number] DOB: [birth year] Today's Date: 08/16/2016   History of Present Illness  Pt adm with ETOH withdrawal, hypokalemia, and elevated lactate. PMH - HTN, DM, essential tremor, chf, rt humeral fx with mulltiple surgeries, back surgery  Clinical Impression  Pt admitted with above diagnosis and presents to PT with functional limitations due to deficits listed below (See PT problem list). Pt needs skilled PT to maximize independence and safety to allow discharge to home with family and resume OPPT if family able to initially provide some incr support. Expect progress to be slow, steady.     Follow Up Recommendations Outpatient PT (resume if pt has adequate home support)    Equipment Recommendations  Rolling walker with 5" wheels    Recommendations for Other Services       Precautions / Restrictions Precautions Precautions: Fall Restrictions Weight Bearing Restrictions: No      Mobility  Bed Mobility               General bed mobility comments: Pt sitting EOB  Transfers Overall transfer level: Needs assistance Equipment used: Rolling walker (2 wheeled) Transfers: Sit to/from Stand Sit to Stand: Min assist         General transfer comment: Assist to bring hips up and for balance  Ambulation/Gait Ambulation/Gait assistance: Min assist Ambulation Distance (Feet): 60 Feet Assistive device: Rolling walker (2 wheeled) Gait Pattern/deviations: Step-through pattern;Decreased step length - right;Decreased step length - left;Shuffle;Trunk flexed Gait velocity: decr Gait velocity interpretation: Below normal speed for age/gender General Gait Details: Verbal cues to stand more erect and stay closer to walker. Attempted to amb 1 hand held but pt felt insecure and reaching for rail for extra support  Stairs            Wheelchair Mobility    Modified Rankin (Stroke Patients Only)        Balance Overall balance assessment: Needs assistance Sitting-balance support: No upper extremity supported;Feet supported Sitting balance-Leahy Scale: Good     Standing balance support: No upper extremity supported Standing balance-Leahy Scale: Fair                               Pertinent Vitals/Pain Pain Assessment: No/denies pain    Home Living Family/patient expects to be discharged to:: Private residence Living Arrangements: Children Available Help at Discharge: Family (Pt states someone there most of day) Type of Home: House Home Access: Stairs to enter Entrance Stairs-Rails: None Entrance Stairs-Number of Steps: 4 Home Layout: One level Home Equipment: Cane - single point;Wheelchair - manual;Shower seat;Hand held shower head;Adaptive equipment      Prior Function Level of Independence: Independent         Comments: Pt reports he goes to OPPT for some chronic gait instability     Hand Dominance   Dominant Hand: Right    Extremity/Trunk Assessment   Upper Extremity Assessment Upper Extremity Assessment: Generalized weakness;RUE deficits/detail RUE Deficits / Details: multiple surgeries on rt humerus    Lower Extremity Assessment Lower Extremity Assessment: Generalized weakness       Communication   Communication: No difficulties  Cognition Arousal/Alertness: Awake/alert Behavior During Therapy: Anxious Overall Cognitive Status: Within Functional Limits for tasks assessed                      General Comments      Exercises     Assessment/Plan  PT Assessment Patient needs continued PT services  PT Problem List Decreased strength;Decreased activity tolerance;Decreased balance;Decreased mobility;Decreased knowledge of use of DME          PT Treatment Interventions DME instruction;Gait training;Functional mobility training;Therapeutic activities;Therapeutic exercise;Balance training;Patient/family education    PT Goals  (Current goals can be found in the Care Plan section)  Acute Rehab PT Goals Patient Stated Goal: not stated PT Goal Formulation: With patient Time For Goal Achievement: 08/23/16 Potential to Achieve Goals: Good    Frequency Min 3X/week   Barriers to discharge        Co-evaluation               End of Session Equipment Utilized During Treatment: Gait belt Activity Tolerance: Patient limited by fatigue Patient left: in chair;with call bell/phone within reach;with chair alarm set Nurse Communication: Mobility status         Time: 2671-2458 PT Time Calculation (min) (ACUTE ONLY): 18 min   Charges:   PT Evaluation $PT Eval Moderate Complexity: 1 Procedure     PT G CodesAngelina Ok [place] 09-07-2016, 3:21 PM [employer] PT [phone]

## 2016-08-16 NOTE — Progress Notes (Signed)
PROGRESS NOTE    Justin Bernard  WUJ:[id number] DOB: August 22, 1960 DOA: 08/13/2016 PCP: No PCP Per Patient    Brief Narrative:  Justin Bernard is a 56 y.o. male with medical history significant for hypertension, essential tremor, nephrolithiasis, and chronic systolic CHF who presents to the emergency department with tremors, nausea, vomiting, and malaise. Patient reports that he suffers from essential tremor, but this has been much worse over the past couple days, over which time he is also developed nausea with dry heaving. The patient's ex-wife visited him today to discuss something about their children and noted him to look ill, dry heaving, diaphoretic, and shaking. She brought him into the emergency department for further evaluation of this. The patient initially denied any significant alcohol use, but later endorsed very mild consumption. He gave permission to discuss care with his ex-wife, who reports a strong suspicion for chronic daily heavy drinking for the past couple years at least. Patient denies any recent fevers or chills, denies rhinorrhea or sore throat, denies chest pain or palpitations, and denies dyspnea or cough.  ED Course: Upon arrival to the ED, patient is found to be afebrile, saturating well on room air, tachycardic to 110s, and hypotensive to 66/50. EKG reveals a sinus or possibly ectopic atrial tachycardia with rate 101. Chest x-ray is negative for acute cardiopulmonary disease. Chemistry panels notable for mild hyponatremia and hypochloremia, and mild elevations in serum transaminases. Ethanol level is elevated 293, UDS is negative, and urinalysis is unremarkable. CBC is notable for mild leukocytosis to 12,100 and macrocytosis with MCV of 104.8, but no anemia. Lactic acid is elevated to 3.64 initially, then down to 3.2, before rising again up to 4.5. Blood and urine cultures were obtained in the ED and patient was given 2 l of normal saline. He was treated empirically with  vancomycin and cefepime and given multiple doses of IV Ativan for withdrawal symptoms. Patient was experiencing visual hallucinations in the emergency department consistent with withdrawal. Blood pressure responded very well to IV fluids, but patient remains mildly tachycardic. He will be admitted to the stepdown unit for ongoing evaluation and management of suspected complicated alcohol withdrawal.   Assessment & Plan:   Principal Problem:   Alcohol withdrawal delirium (HCC) Active Problems:   Hypotension   Chronic systolic CHF (congestive heart failure), NYHA class 2 (HCC)   Hyponatremia   Elevated serum lactate dehydrogenase   Nausea & vomiting   SIRS (systemic inflammatory response syndrome) (HCC)   Diabetes mellitus type II, non insulin dependent (HCC)   Alcohol abuse with intoxication with complication (HCC)   Alcohol withdrawal delirium, acute, hyperactive (HCC)   Alcohol abuse with withdrawal, complicated - Pt initially denies any EtOH use, later endorses 1-2 drinks 3x/wk, but EtOH level is 193, family suspects heavy daily drinking - withdrawals better controlled today - continue with CIWA (last two scores of 9 and 2) - provide supportive care with IVF, vitamins & minerals, anti-emetics - Social work consultation requested   - has received IV ativan 4x in the past day  Hypotension, elevated lactate  - Initial BP 66/53, responded well to IVF and has remained stable since  - Lactic acid has been elevated and SIRS criteria are present, but there is no fever and no apparent source of infection  - Suspect this is related to withdrawal with vomiting and poor oral intake, exacerbated by continued use of antihypertensives and diuretics  - blood pressure WNL - Blood cultures showing no growth x2 days - Urine  culture shows insufficient growth  - pt received one dose each of vancomycin and cefepime in the ED; plan to hold further abx for now while following cultures and clinical  course   - tachycardia could be explained by alcohol withdrawal as well as patient being off his coreg  Nausea, vomiting - Pt controlled today - CTA abdomen notable for fatty infiltration of liver and nephrolithiasis, but no bowel obstruction, arterial stenosis, dissection, or apparent source of infection - Abdominal exam benign; suspect this is related to EtOH withdrawal and is being addressed as above with Ativan and anti-emetics  - patient tolerating PO with no nausea or vomiting  Hypokalemia and Hypomagnesemia - replaced again today as low again today (K of 3.4 and Mg of 1.4) - repeat this am is pending  Chronic systolic CHF, ?resolved   - Pt reportedly had non-ischemic cardiomyopathy with EF 10% while living in [place], had ICD placed, and was considered for heart transplant  - He has since been evaluated by cardiology in [place] with near-normalization in systolic function  - TTE (07/18/15) with EF 50-55%, mild LVH, no diastolic dysfunction, and no significant valvular disease  - Taking Lasix, Aldactone, and Coreg at home - blood pressure low 110s systolically - restarted Coreg, lasix and spironolactone - Follow daily wts - I/Os   Type II DM  - A1c was 5.0% in September 2017, was 8.2% December 2016 - Managed with glipizide only at home; this will be held  - Check CBG with meals and qHS  - Start a low-intensity SSI only and adjust prn    Nephrolithiasis  - Pt with hx of nephrolithiasis, has passed stones spontaneously  - CTA from ED notable for bilateral nephrolithiasis, including a 27 x 17 mm stone in left renal pelvis and upper pole resulting in severe dilation of left upper pole  - There is no abd or CVA tenderness, no urinary sxs, UA not c/w infection, and renal function remains normal and at baseline   -  Discussed with urologist on call who states that patient stone has likely been present for some time - will need outpatient follow up   DVT prophylaxis: sq  Lovenox Code Status: Full  Family Communication: no family present at bedside this am Disposition Plan: Admit to stepdown; can transfer to telemetry today Admission status: Inpatient     Consultants:   None  Procedures:   None  Antimicrobials:   Cefepime 12/30  Vancomycin 12/30    Subjective:  Patient sitting up and eating breakfast.  Says he feels better this morning.  Reports that he slept well and his itching has improved substantially as has his tremor.  Wants to learn more about outpatient alcohol programs.  Objective: Vitals:   08/15/16 2027 08/16/16 0032 08/16/16 0428 08/16/16 0700  BP: 100/74 110/85 113/86 116/79  Pulse: 93 98 89 90  Resp: (!) 25 20 18  (!) 26  Temp: 98.2 F (36.8 C) 98 F (36.7 C) 97.9 F (36.6 C) 97.8 F (36.6 C)  TempSrc: Oral Oral Oral Oral  SpO2: 99% 99% 100% 94%  Weight:   120.1 kg (264 lb 12.4 oz)   Height:        Intake/Output Summary (Last 24 hours) at 08/16/16 0810 Last data filed at 08/15/16 1800  Gross per 24 hour  Intake              580 ml  Output  0 ml  Net              580 ml   Filed Weights   08/13/16 1353 08/15/16 0531 08/16/16 0428  Weight: 113.4 kg (250 lb) 115.9 kg (255 lb 8 oz) 120.1 kg (264 lb 12.4 oz)    Examination:  General exam: Appears calm and comfortable  Respiratory system: Clear to auscultation. Respiratory effort normal. Cardiovascular system: S1 & S2 heard, RRR. No JVD, murmurs, rubs, gallops or clicks. No pedal edema. Gastrointestinal system: Abdomen is nondistended, soft and nontender. No organomegaly or masses felt. Normal bowel sounds heard. Central nervous system: Alert and oriented. Improved bilateral tremor, no focal neurologic deficiets Extremities: Symmetric 5 x 5 power. Skin: Numerous small lesions on arms, upper back, lower back, buttocks, thighs and legs noted.  Patient states they are itchy.  No lesions noted between fingers or toes.  Ecchymosis on inner left knee  noted Psychiatry: more appropriate insight, appropriate affect     Data Reviewed: I have personally reviewed following labs and imaging studies  CBC:  Recent Labs Lab 08/13/16 1351 08/13/16 1436 08/14/16 0218 08/15/16 1840 08/16/16 0520  WBC 12.1*  --  8.4 5.7 6.3  NEUTROABS  --   --  7.1 3.7 3.9  HGB 13.2 13.3 11.2* 11.1* 11.2*  HCT 37.2* 39.0 31.9* 31.4* 33.0*  MCV 104.8*  --  105.6* 106.4* 107.5*  PLT 234  --  171 177 192   Basic Metabolic Panel:  Recent Labs Lab 08/13/16 1351 08/13/16 1436 08/14/16 0218 08/15/16 0851 08/15/16 1840  NA 134* 134* 133* 131* 132*  K 4.0 3.9 3.2* 3.4* 3.5  CL 97* 95* 101 101 100*  CO2 25  --  20* 23 19*  GLUCOSE 113* 114* 77 165* 194*  BUN 11 12 12 8 9   CREATININE 0.88 1.10 0.91 0.76 0.78  CALCIUM 10.0  --  8.0* 7.8* 7.5*  MG  --   --  1.2* 1.4*  --    GFR: Estimated Creatinine Clearance: 149.5 mL/min (by C-G formula based on SCr of 0.78 mg/dL). Liver Function Tests:  Recent Labs Lab 08/13/16 1351 08/14/16 0218 08/15/16 1840  AST 109* 100* 103*  ALT 66* 51 48  ALKPHOS 122 100 103  BILITOT 1.2 1.5* 1.2  PROT 6.6 5.5* 5.4*  ALBUMIN 3.2* 2.6* 2.7*    Recent Labs Lab 08/13/16 1603  LIPASE 41   No results for input(s): AMMONIA in the last 168 hours. Coagulation Profile: No results for input(s): INR, PROTIME in the last 168 hours. Cardiac Enzymes: No results for input(s): CKTOTAL, CKMB, CKMBINDEX, TROPONINI in the last 168 hours. BNP (last 3 results) No results for input(s): PROBNP in the last 8760 hours. HbA1C: No results for input(s): HGBA1C in the last 72 hours. CBG:  Recent Labs Lab 08/14/16 2134 08/15/16 0759 08/15/16 1133 08/15/16 1603 08/15/16 2156  GLUCAP 91 103* 112* 137* 102*   Lipid Profile: No results for input(s): CHOL, HDL, LDLCALC, TRIG, CHOLHDL, LDLDIRECT in the last 72 hours. Thyroid Function Tests: No results for input(s): TSH, T4TOTAL, FREET4, T3FREE, THYROIDAB in the last 72  hours. Anemia Panel: No results for input(s): VITAMINB12, FOLATE, FERRITIN, TIBC, IRON, RETICCTPCT in the last 72 hours. Sepsis Labs:  Recent Labs Lab 08/13/16 1603 08/13/16 1705 08/13/16 2115 08/13/16 2342  LATICACIDVEN 3.2* 4.15* 3.6* 2.5*    Recent Results (from the past 240 hour(s))  Urine culture     Status: Abnormal   Collection Time: 08/13/16  3:08 PM  Result Value Ref Range Status   Specimen Description URINE, RANDOM  Final   Special Requests NONE  Final   Culture <10,000 COLONIES/mL INSIGNIFICANT GROWTH (A)  Final   Report Status 08/14/2016 FINAL  Final  Blood culture (routine x 2)     Status: None (Preliminary result)   Collection Time: 08/13/16  3:11 PM  Result Value Ref Range Status   Specimen Description BLOOD RIGHT HAND  Final   Special Requests BOTTLES DRAWN AEROBIC AND ANAEROBIC 5CC  Final   Culture NO GROWTH 2 DAYS  Final   Report Status PENDING  Incomplete  Blood culture (routine x 2)     Status: None (Preliminary result)   Collection Time: 08/13/16  6:15 PM  Result Value Ref Range Status   Specimen Description BLOOD LEFT ANTECUBITAL  Final   Special Requests BOTTLES DRAWN AEROBIC AND ANAEROBIC 5CC  Final   Culture NO GROWTH 2 DAYS  Final   Report Status PENDING  Incomplete  MRSA PCR Screening     Status: None   Collection Time: 08/14/16  8:01 AM  Result Value Ref Range Status   MRSA by PCR NEGATIVE NEGATIVE Final    Comment:        The GeneXpert MRSA Assay (FDA approved for NASAL specimens only), is one component of a comprehensive MRSA colonization surveillance program. It is not intended to diagnose MRSA infection nor to guide or monitor treatment for MRSA infections.          Radiology Studies: No results found.      Scheduled Meds: . carvedilol  6.25 mg Oral Daily  . enoxaparin (LOVENOX) injection  40 mg Subcutaneous Q24H  . folic acid  1 mg Oral Daily  . furosemide  20 mg Oral Daily  . insulin aspart  0-5 Units Subcutaneous  QHS  . insulin aspart  0-9 Units Subcutaneous TID WC  . multivitamin with minerals  1 tablet Oral Daily  . potassium chloride  40 mEq Oral BID  . sodium chloride flush  3 mL Intravenous Q12H  . thiamine  100 mg Oral Daily   Continuous Infusions:   LOS: 3 days    Time spent: 30 minutes    Katrinka Blazing, MD [facility] Pager [phone]  If 7PM-7AM, please contact night-coverage [email or web address] Password [email or web address] 08/16/2016, 8:10 AM

## 2016-08-16 NOTE — Progress Notes (Signed)
CSW received ETOH consult. Patient' elderly family member at bedside. CSW left resources on patient's shadow chart.  CSW signing off.   Osborne Casco Abdurrahman Petersheim LCSWA [phone]

## 2016-08-17 LAB — GLUCOSE, CAPILLARY
GLUCOSE-CAPILLARY: 120 mg/dL — AB (ref 65–99)
Glucose-Capillary: 101 mg/dL — ABNORMAL HIGH (ref 65–99)
Glucose-Capillary: 126 mg/dL — ABNORMAL HIGH (ref 65–99)

## 2016-08-17 LAB — CBC WITH DIFFERENTIAL/PLATELET
BASOS ABS: 0.1 10*3/uL (ref 0.0–0.1)
BASOS PCT: 1 %
EOS PCT: 12 %
Eosinophils Absolute: 0.8 10*3/uL — ABNORMAL HIGH (ref 0.0–0.7)
HEMATOCRIT: 35.1 % — AB (ref 39.0–52.0)
Hemoglobin: 11.8 g/dL — ABNORMAL LOW (ref 13.0–17.0)
LYMPHS PCT: 16 %
Lymphs Abs: 1 10*3/uL (ref 0.7–4.0)
MCH: 36.6 pg — ABNORMAL HIGH (ref 26.0–34.0)
MCHC: 33.6 g/dL (ref 30.0–36.0)
MCV: 109 fL — AB (ref 78.0–100.0)
Monocytes Absolute: 0.7 10*3/uL (ref 0.1–1.0)
Monocytes Relative: 11 %
NEUTROS ABS: 3.8 10*3/uL (ref 1.7–7.7)
Neutrophils Relative %: 60 %
PLATELETS: 229 10*3/uL (ref 150–400)
RBC: 3.22 MIL/uL — AB (ref 4.22–5.81)
RDW: 15.3 % (ref 11.5–15.5)
WBC: 6.4 10*3/uL (ref 4.0–10.5)

## 2016-08-17 LAB — COMPREHENSIVE METABOLIC PANEL
ALT: 51 U/L (ref 17–63)
AST: 115 U/L — ABNORMAL HIGH (ref 15–41)
Albumin: 2.7 g/dL — ABNORMAL LOW (ref 3.5–5.0)
Alkaline Phosphatase: 115 U/L (ref 38–126)
Anion gap: 7 (ref 5–15)
BUN: 6 mg/dL (ref 6–20)
CO2: 22 mmol/L (ref 22–32)
Calcium: 8 mg/dL — ABNORMAL LOW (ref 8.9–10.3)
Chloride: 106 mmol/L (ref 101–111)
Creatinine, Ser: 0.73 mg/dL (ref 0.61–1.24)
GFR calc Af Amer: >60 mL/min (ref >60)
GFR calc non Af Amer: >60 mL/min (ref >60)
Glucose, Bld: 106 mg/dL — ABNORMAL HIGH (ref 65–99)
Potassium: 3.9 mmol/L (ref 3.5–5.1)
Sodium: 135 mmol/L (ref 135–145)
Total Bilirubin: 1.3 mg/dL — ABNORMAL HIGH (ref 0.3–1.2)
Total Protein: 5.5 g/dL — ABNORMAL LOW (ref 6.5–8.1)

## 2016-08-17 MED ORDER — POTASSIUM CHLORIDE IN NACL 20-0.9 MEQ/L-% IV SOLN
INTRAVENOUS | Status: DC
  Administered 2016-08-17 – 2016-08-18 (×2): via INTRAVENOUS
  Filled 2016-08-17 (×2): qty 1000

## 2016-08-17 NOTE — Progress Notes (Signed)
Physical Therapy Treatment Patient Details Name: Justin Bernard MRN: [id number] DOB: August 22, 1960 Today's Date: 08/17/2016    History of Present Illness Pt adm with ETOH withdrawal, hypokalemia, and elevated lactate. PMH - HTN, DM, essential tremor, chf, rt humeral fx with mulltiple surgeries, back surgery    PT Comments    Pt performed increased gait and stair training in prep for d/c home.  Pt required cues for safety with RW.  Reports he has rollator at home.  Will try gait with rollator next session.    Follow Up Recommendations  Outpatient PT (resume if patient has adequate home support.  )     Equipment Recommendations  Rolling walker with 5" wheels    Recommendations for Other Services       Precautions / Restrictions Precautions Precautions: Fall Restrictions Weight Bearing Restrictions: No    Mobility  Bed Mobility Overal bed mobility: Independent             General bed mobility comments: Performed supine to sit without assistance.    Transfers Overall transfer level: Needs assistance Equipment used: Rolling walker (2 wheeled) Transfers: Sit to/from Stand Sit to Stand: Min guard         General transfer comment: for safety cues for hand placement to and from seated surface.    Ambulation/Gait Ambulation/Gait assistance: Min guard Ambulation Distance (Feet): 110 Feet (x2 trials performed seated rest break in between trials.  ) Assistive device: Rolling walker (2 wheeled) Gait Pattern/deviations: Step-through pattern;Decreased step length - right;Decreased step length - left;Shuffle;Trunk flexed Gait velocity: decr Gait velocity interpretation: Below normal speed for age/gender General Gait Details: Verbal cues to stand more erect and stay closer to walker.  Cues to increase stride length.  Fatigues quickly.     Stairs Stairs: Yes   Stair Management: Two rails;Forwards Number of Stairs: 2 General stair comments: Cues for sequencing and hand  placement to improve safety.    Wheelchair Mobility    Modified Rankin (Stroke Patients Only)       Balance Overall balance assessment: Needs assistance   Sitting balance-Leahy Scale: Good       Standing balance-Leahy Scale: Fair                      Cognition Arousal/Alertness: Awake/alert Behavior During Therapy: Anxious Overall Cognitive Status: Within Functional Limits for tasks assessed                      Exercises      General Comments        Pertinent Vitals/Pain Pain Assessment: No/denies pain    Home Living                      Prior Function            PT Goals (current goals can now be found in the care plan section) Acute Rehab PT Goals Patient Stated Goal: not stated Potential to Achieve Goals: Good Progress towards PT goals: Progressing toward goals    Frequency    Min 3X/week      PT Plan Current plan remains appropriate    Co-evaluation             End of Session Equipment Utilized During Treatment: Gait belt Activity Tolerance: Patient limited by fatigue Patient left: in chair;with call bell/phone within reach;with chair alarm set     Time: 4098-1191 PT Time Calculation (min) (ACUTE ONLY): 25 min  Charges:  $  Gait Training: 8-22 mins $Therapeutic Activity: 8-22 mins                    G Codes:      Justin Bernard 09/11/2016, 11:57 AM Joycelyn Rua, PTA pager [phone]

## 2016-08-17 NOTE — [place] Notes (Signed)
PROGRESS NOTE    Justin Bernard  ZOX:[id number] DOB: 09-Dec-1960 DOA: 08/13/2016 PCP: No PCP Per Patient    Brief Narrative:  Justin Bernard is a 56 y.o. male with medical history significant for hypertension, essential tremor, nephrolithiasis, and chronic systolic CHF who presents to the emergency department with tremors, nausea, vomiting, and malaise. Admitted for alcohol abuse with withdrawals   Assessment & Plan:   Principal Problem:   Alcohol withdrawal delirium (HCC) Active Problems:   Hypotension   Chronic systolic CHF (congestive heart failure), NYHA class 2 (HCC)   Hyponatremia   Elevated serum lactate dehydrogenase   Nausea & vomiting   SIRS (systemic inflammatory response syndrome) (HCC)   Diabetes mellitus type II, non insulin dependent (HCC)   Alcohol abuse with intoxication with complication (HCC)   Alcohol withdrawal delirium, acute, hyperactive (HCC)   Alcohol abuse with withdrawal, complicated - Pt initially denies any EtOH use, later endorses 1-2 drinks 3x/wk, but EtOH level is 193, family suspects heavy daily drinking - continue with CIWA (acquired 10 mg of Ativan over last 24 hours) - provide supportive care with IVF, vitamins & minerals, anti-emetics - Social work consultation requested    Hypotension, elevated lactate  - Initial BP 66/53, responded well to IVF and has remained stable since  - Appears to be improving, appears to be withdrawals, nausea and vomiting on presentation and decreased oral intake - January IV fluids  Nausea, vomiting - CTA abdomen notable for fatty infiltration of liver and nephrolithiasis, but no bowel obstruction, arterial stenosis, dissection, or apparent source of infection - Most likely due to alcohol withdrawal, resolved  Hypokalemia and Hypomagnesemia - 1) repeat as needed  Chronic systolic CHF   - Pt reportedly had non-ischemic cardiomyopathy with EF 10% while living in [place], had ICD placed, and was  considered for heart transplant  - He has since been evaluated by cardiology in [place] with near-normalization in systolic function  - TTE (07/18/15) with EF 50-55%, mild LVH, no diastolic dysfunction, and no significant valvular disease  - Taking Lasix, Aldactone, and Coreg at home - Back on Coreg, cont to hold  Aldactone and Lasix - blood pressure low 110s systolically  Type II DM  - A1c was 5.0% in September 2017, was 8.2% December 2016 - Managed with glipizide only at home; this will be held  - Check CBG with meals and qHS  - on  low-intensity SSI only and adjust prn    Nephrolithiasis  - Pt with hx of nephrolithiasis, has passed stones spontaneously  - CTA from ED notable for bilateral nephrolithiasis, including a 27 x 17 mm stone in left renal pelvis and upper pole resulting in severe dilation of left upper pole  - There is no abd or CVA tenderness, no urinary sxs, UA not c/w infection, and renal function remains normal and at baseline   -  will need non-emergent urology evaluation   DVT prophylaxis: sq Lovenox Code Status: Full  Family Communication:  none at bedside Disposition Plan:  home in 1-2 days Admission status: Inpatient     Consultants:   None  Procedures:   None  Antimicrobials:   Cefepime 12/30  Vancomycin 12/30    Subjective: he feels better today , tremors are improving, but reports this is chronic problem over the last year , denies any chest pain, shortness of breath  Objective: Vitals:   08/16/16 1744 08/16/16 1746 08/16/16 2221 08/17/16 0636  BP:  123/69 107/71 92/63  Pulse:  92 96  90  Resp:  (!) 24 20 18   Temp:  97.9 F (36.6 C) 98.3 F (36.8 C) 98.5 F (36.9 C)  TempSrc:  Oral Oral Oral  SpO2:  99% 100% 96%  Weight: 119.7 kg (263 lb 14.3 oz)     Height: 6\' 4"  (1.93 m)       Intake/Output Summary (Last 24 hours) at 08/17/16 1324 Last data filed at 08/16/16 2250  Gross per 24 hour  Intake                0 ml  Output               200 ml  Net             -200 ml   Filed Weights   08/15/16 0531 08/16/16 0428 08/16/16 1744  Weight: 115.9 kg (255 lb 8 oz) 120.1 kg (264 lb 12.4 oz) 119.7 kg (263 lb 14.3 oz)    Examination:  General exam: Appears calm and comfortable , More appropriate Respiratory system: Clear to auscultation. Respiratory effort normal. Cardiovascular system: S1 & S2 heard, RRR. No JVD, murmurs, rubs, gallops or clicks. No pedal edema. Gastrointestinal system: Abdomen is nondistended, soft and nontender. No organomegaly or masses felt. Normal bowel sounds heard. Central nervous system: Alert and oriented. Significant bilateral tremor, no focal neurologic deficiets Extremities: Symmetric 5 x 5 power. Skin: Numerous small lesions on arms, upper back, lower back, buttocks, thighs and legs noted.  Patient states they are itchy.  No lesions noted between fingers or toes.  Ecchymosis on inner left knee noted Psychiatry: more appropriate insight, appropriate affect     Data Reviewed: I have personally reviewed following labs and imaging studies  CBC:  Recent Labs Lab 08/13/16 1351 08/13/16 1436 08/14/16 0218 08/15/16 1840 08/16/16 0520 08/17/16 0800  WBC 12.1*  --  8.4 5.7 6.3 6.4  NEUTROABS  --   --  7.1 3.7 3.9 3.8  HGB 13.2 13.3 11.2* 11.1* 11.2* 11.8*  HCT 37.2* 39.0 31.9* 31.4* 33.0* 35.1*  MCV 104.8*  --  105.6* 106.4* 107.5* 109.0*  PLT 234  --  171 177 192 229   Basic Metabolic Panel:  Recent Labs Lab 08/13/16 1351 08/13/16 1436 08/14/16 0218 08/15/16 0851 08/15/16 1840 08/17/16 0800  NA 134* 134* 133* 131* 132* 135  K 4.0 3.9 3.2* 3.4* 3.5 3.9  CL 97* 95* 101 101 100* 106  CO2 25  --  20* 23 19* 22  GLUCOSE 113* 114* 77 165* 194* 106*  BUN 11 12 12 8 9 6   CREATININE 0.88 1.10 0.91 0.76 0.78 0.73  CALCIUM 10.0  --  8.0* 7.8* 7.5* 8.0*  MG  --   --  1.2* 1.4*  --   --    GFR: Estimated Creatinine Clearance: 149.3 mL/min (by C-G formula based on SCr of 0.73  mg/dL). Liver Function Tests:  Recent Labs Lab 08/13/16 1351 08/14/16 0218 08/15/16 1840 08/17/16 0800  AST 109* 100* 103* 115*  ALT 66* 51 48 51  ALKPHOS 122 100 103 115  BILITOT 1.2 1.5* 1.2 1.3*  PROT 6.6 5.5* 5.4* 5.5*  ALBUMIN 3.2* 2.6* 2.7* 2.7*    Recent Labs Lab 08/13/16 1603  LIPASE 41   No results for input(s): AMMONIA in the last 168 hours. Coagulation Profile: No results for input(s): INR, PROTIME in the last 168 hours. Cardiac Enzymes: No results for input(s): CKTOTAL, CKMB, CKMBINDEX, TROPONINI in the last 168 hours. BNP (last 3 results) No  results for input(s): PROBNP in the last 8760 hours. HbA1C: No results for input(s): HGBA1C in the last 72 hours. CBG:  Recent Labs Lab 08/16/16 1138 08/16/16 1555 08/16/16 2217 08/17/16 0755 08/17/16 1158  GLUCAP 127* 134* 116* 101* 126*   Lipid Profile: No results for input(s): CHOL, HDL, LDLCALC, TRIG, CHOLHDL, LDLDIRECT in the last 72 hours. Thyroid Function Tests: No results for input(s): TSH, T4TOTAL, FREET4, T3FREE, THYROIDAB in the last 72 hours. Anemia Panel: No results for input(s): VITAMINB12, FOLATE, FERRITIN, TIBC, IRON, RETICCTPCT in the last 72 hours. Sepsis Labs:  Recent Labs Lab 08/13/16 1603 08/13/16 1705 08/13/16 2115 08/13/16 2342  LATICACIDVEN 3.2* 4.15* 3.6* 2.5*    Recent Results (from the past 240 hour(s))  Urine culture     Status: Abnormal   Collection Time: 08/13/16  3:08 PM  Result Value Ref Range Status   Specimen Description URINE, RANDOM  Final   Special Requests NONE  Final   Culture <10,000 COLONIES/mL INSIGNIFICANT GROWTH (A)  Final   Report Status 08/14/2016 FINAL  Final  Blood culture (routine x 2)     Status: None (Preliminary result)   Collection Time: 08/13/16  3:11 PM  Result Value Ref Range Status   Specimen Description BLOOD RIGHT HAND  Final   Special Requests BOTTLES DRAWN AEROBIC AND ANAEROBIC 5CC  Final   Culture NO GROWTH 3 DAYS  Final   Report  Status PENDING  Incomplete  Blood culture (routine x 2)     Status: None (Preliminary result)   Collection Time: 08/13/16  6:15 PM  Result Value Ref Range Status   Specimen Description BLOOD LEFT ANTECUBITAL  Final   Special Requests BOTTLES DRAWN AEROBIC AND ANAEROBIC 5CC  Final   Culture NO GROWTH 3 DAYS  Final   Report Status PENDING  Incomplete  MRSA PCR Screening     Status: None   Collection Time: 08/14/16  8:01 AM  Result Value Ref Range Status   MRSA by PCR NEGATIVE NEGATIVE Final    Comment:        The GeneXpert MRSA Assay (FDA approved for NASAL specimens only), is one component of a comprehensive MRSA colonization surveillance program. It is not intended to diagnose MRSA infection nor to guide or monitor treatment for MRSA infections.          Radiology Studies: No results found.      Scheduled Meds: . carvedilol  6.25 mg Oral Daily  . enoxaparin (LOVENOX) injection  40 mg Subcutaneous Q24H  . folic acid  1 mg Oral Daily  . furosemide  20 mg Oral Daily  . insulin aspart  0-5 Units Subcutaneous QHS  . insulin aspart  0-9 Units Subcutaneous TID WC  . multivitamin with minerals  1 tablet Oral Daily  . sodium chloride flush  3 mL Intravenous Q12H  . spironolactone  25 mg Oral Daily  . thiamine  100 mg Oral Daily   Continuous Infusions:   LOS: 4 days      Huey Bienenstock, MD [facility] Pager [phone]  If 7PM-7AM, please contact night-coverage [email or web address] Password [email or web address] 08/17/2016, 1:24 PM

## 2016-08-18 ENCOUNTER — Inpatient Hospital Stay (HOSPITAL_COMMUNITY): Payer: Managed Care, Other (non HMO)

## 2016-08-18 DIAGNOSIS — I509 Heart failure, unspecified: Secondary | ICD-10-CM

## 2016-08-18 LAB — GLUCOSE, CAPILLARY
GLUCOSE-CAPILLARY: 100 mg/dL — AB (ref 65–99)
GLUCOSE-CAPILLARY: 127 mg/dL — AB (ref 65–99)
Glucose-Capillary: 112 mg/dL — ABNORMAL HIGH (ref 65–99)
Glucose-Capillary: 108 mg/dL — ABNORMAL HIGH (ref 65–99)
Glucose-Capillary: 117 mg/dL — ABNORMAL HIGH (ref 65–99)

## 2016-08-18 LAB — CULTURE, BLOOD (ROUTINE X 2)
CULTURE: NO GROWTH
Culture: NO GROWTH

## 2016-08-18 LAB — VITAMIN B12: Vitamin B-12: 369 pg/mL (ref 180–914)

## 2016-08-18 LAB — BASIC METABOLIC PANEL
Anion gap: 7 (ref 5–15)
BUN: 6 mg/dL (ref 6–20)
CHLORIDE: 107 mmol/L (ref 101–111)
CO2: 23 mmol/L (ref 22–32)
Calcium: 8.3 mg/dL — ABNORMAL LOW (ref 8.9–10.3)
Creatinine, Ser: 1.03 mg/dL (ref 0.61–1.24)
GFR calc Af Amer: >60 mL/min (ref >60)
GLUCOSE: 121 mg/dL — AB (ref 65–99)
POTASSIUM: 3.5 mmol/L (ref 3.5–5.1)
Sodium: 137 mmol/L (ref 135–145)

## 2016-08-18 LAB — MAGNESIUM: Magnesium: 1.7 mg/dL (ref 1.7–2.4)

## 2016-08-18 LAB — FOLATE: FOLATE: 10.6 ng/mL (ref >5.9)

## 2016-08-18 LAB — PHOSPHORUS: Phosphorus: 2.5 mg/dL (ref 2.5–4.6)

## 2016-08-18 MED ORDER — ZOLPIDEM TARTRATE 5 MG PO TABS
5.0000 mg | ORAL_TABLET | Freq: Every evening | ORAL | Status: DC | PRN
  Administered 2016-08-18 – 2016-08-20 (×3): 5 mg via ORAL
  Filled 2016-08-18 (×3): qty 1

## 2016-08-18 MED ORDER — THIAMINE HCL 100 MG/ML IJ SOLN
500.0000 mg | Freq: Three times a day (TID) | INTRAVENOUS | Status: DC
  Administered 2016-08-18 – 2016-08-20 (×8): 500 mg via INTRAVENOUS
  Filled 2016-08-18 (×12): qty 5

## 2016-08-18 MED ORDER — CARVEDILOL 3.125 MG PO TABS
3.1250 mg | ORAL_TABLET | Freq: Every day | ORAL | Status: DC
  Administered 2016-08-19 – 2016-08-21 (×3): 3.125 mg via ORAL
  Filled 2016-08-18 (×3): qty 1

## 2016-08-18 MED ORDER — SPIRONOLACTONE 25 MG PO TABS
12.5000 mg | ORAL_TABLET | Freq: Every day | ORAL | Status: DC
  Administered 2016-08-19 – 2016-08-21 (×3): 12.5 mg via ORAL
  Filled 2016-08-18 (×3): qty 1

## 2016-08-18 NOTE — [place] Notes (Signed)
Patient's BP 96/68 this morning. Dr. Randol Kern advised writer to hold lasix, spironolactone and coreg medications and recheck blood pressure. If systolic BP 100 or greater provider states to give lasix as patient is edematous. Repeat BP 100/71. Will give lasix and continue to monitor. Justin Bernard

## 2016-08-18 NOTE — [place] Notes (Addendum)
PROGRESS NOTE    Justin Bernard  WUJ:[id number] DOB: 07-03-1961 DOA: 08/13/2016 PCP: No PCP Per Patient    Brief Narrative:  Justin Bernard is a 56 y.o. male with medical history significant for hypertension, essential tremor, nephrolithiasis, and chronic systolic CHF who presents to the emergency department with tremors, nausea, vomiting, and malaise. Admitted for alcohol abuse with withdrawals   Assessment & Plan:   Principal Problem:   Alcohol withdrawal delirium (HCC) Active Problems:   Hypotension   Chronic systolic CHF (congestive heart failure), NYHA class 2 (HCC)   Hyponatremia   Elevated serum lactate dehydrogenase   Nausea & vomiting   SIRS (systemic inflammatory response syndrome) (HCC)   Diabetes mellitus type II, non insulin dependent (HCC)   Alcohol abuse with intoxication with complication (HCC)   Alcohol withdrawal delirium, acute, hyperactive (HCC)   Alcohol abuse with withdrawal, complicated - Pt initially denies any EtOH use, later endorses 1-2 drinks 3x/wk, but EtOH level is 193, family suspects heavy daily drinking - continue with CIWA , no Ativan requirement of the last 24 hours - provide supportive care with IVF, vitamins & minerals, anti-emetics - Social work consultation requested   - Patient with ataxia and unsteady gait, CT head with no acute findings, seen by neurology, currently on thiamine 400 mg IV 3 times a day, patient with significant macrocytosis, will check B-12 and folic acid.  Hypotension, elevated lactate  - Initial BP 66/53, responded well to IVF and has remained stable since  - Appears to be improving, appears to be withdrawals, nausea and vomiting on presentation and decreased oral intake - Blood pressure more stable currently, DC IV fluid as he started to develop lower extremity edema  Nausea, vomiting - CTA abdomen notable for fatty infiltration of liver and nephrolithiasis, but no bowel obstruction, arterial stenosis,  dissection, or apparent source of infection - Most likely due to alcohol withdrawal, resolved  Hypokalemia and Hypomagnesemia - Repeat as needed, recheck phosphorus level  Chronic systolic CHF   - Pt reportedly had non-ischemic cardiomyopathy with EF 10% while living in [place], had ICD placed, and was considered for heart transplant  - He has since been evaluated by cardiology in [place] with near-normalization in systolic function  - TTE (07/18/15) with EF 50-55%, mild LVH, no diastolic dysfunction, and no significant valvular disease  - Taking Lasix, Aldactone, and Coreg at home - Improving Blood pressure, will resume Aldactone and Coreg at half dose given soft blood pressure, continue with Lasix - Recheck 2-D echo given persistence of blood pressure  Type II DM  - A1c was 5.0% in September 2017, was 8.2% December 2016 - Managed with glipizide only at home; this will be held  - Check CBG with meals and qHS  - on  low-intensity SSI only and adjust prn    Nephrolithiasis  - Pt with hx of nephrolithiasis, has passed stones spontaneously  - CTA from ED notable for bilateral nephrolithiasis, including a 27 x 17 mm stone in left renal pelvis and upper pole resulting in severe dilation of left upper pole  - There is no abd or CVA tenderness, no urinary sxs, UA not c/w infection, and renal function remains normal and at baseline   -  will need non-emergent urology evaluation   DVT prophylaxis: sq Lovenox Code Status: Full  Family Communication:  none at bedside Disposition Plan:  home in 1-2 days Admission status: Inpatient     Consultants:   None  Procedures:   None  Antimicrobials:   Cefepime 12/30  Vancomycin 12/30    Subjective: he feels better today , tremors are improving, but reports this is chronic problem over the last year , denies any chest pain, shortness of breath  Objective: Vitals:   08/18/16 0540 08/18/16 0804 08/18/16 1100 08/18/16 1355    BP: 107/72 96/61 100/71 109/73  Pulse: 81 85  73  Resp: 18 18  20   Temp: 97.7 F (36.5 C) 99.5 F (37.5 C)  98.6 F (37 C)  TempSrc: Oral Oral  Oral  SpO2: 99% 96%  99%  Weight: 119.7 kg (264 lb)     Height:        Intake/Output Summary (Last 24 hours) at 08/18/16 1404 Last data filed at 08/18/16 0606  Gross per 24 hour  Intake              925 ml  Output              200 ml  Net              725 ml   Filed Weights   08/16/16 0428 08/16/16 1744 08/18/16 0540  Weight: 120.1 kg (264 lb 12.4 oz) 119.7 kg (263 lb 14.3 oz) 119.7 kg (264 lb)    Examination:  General exam: Appears calm and comfortable , More appropriate Respiratory system: Clear to auscultation. Respiratory effort normal. Cardiovascular system: S1 & S2 heard, RRR. No JVD, murmurs, rubs, gallops or clicks. No pedal edema. Gastrointestinal system: Abdomen is nondistended, soft and nontender. No organomegaly or masses felt. Normal bowel sounds heard. Central nervous system: Alert and oriented. Significant bilateral tremor, no focal neurologic deficiets Extremities: Symmetric 5 x 5 power. Skin: Numerous small lesions on arms, upper back, lower back, buttocks, thighs and legs noted.  Patient states they are itchy.  No lesions noted between fingers or toes.  Ecchymosis on inner left knee noted Psychiatry: more appropriate insight, appropriate affect     Data Reviewed: I have personally reviewed following labs and imaging studies  CBC:  Recent Labs Lab 08/13/16 1351 08/13/16 1436 08/14/16 0218 08/15/16 1840 08/16/16 0520 08/17/16 0800  WBC 12.1*  --  8.4 5.7 6.3 6.4  NEUTROABS  --   --  7.1 3.7 3.9 3.8  HGB 13.2 13.3 11.2* 11.1* 11.2* 11.8*  HCT 37.2* 39.0 31.9* 31.4* 33.0* 35.1*  MCV 104.8*  --  105.6* 106.4* 107.5* 109.0*  PLT 234  --  171 177 192 229   Basic Metabolic Panel:  Recent Labs Lab 08/13/16 1351 08/13/16 1436 08/14/16 0218 08/15/16 0851 08/15/16 1840 08/17/16 0800  NA 134* 134* 133*  131* 132* 135  K 4.0 3.9 3.2* 3.4* 3.5 3.9  CL 97* 95* 101 101 100* 106  CO2 25  --  20* 23 19* 22  GLUCOSE 113* 114* 77 165* 194* 106*  BUN 11 12 12 8 9 6   CREATININE 0.88 1.10 0.91 0.76 0.78 0.73  CALCIUM 10.0  --  8.0* 7.8* 7.5* 8.0*  MG  --   --  1.2* 1.4*  --   --    GFR: Estimated Creatinine Clearance: 149.3 mL/min (by C-G formula based on SCr of 0.73 mg/dL). Liver Function Tests:  Recent Labs Lab 08/13/16 1351 08/14/16 0218 08/15/16 1840 08/17/16 0800  AST 109* 100* 103* 115*  ALT 66* 51 48 51  ALKPHOS 122 100 103 115  BILITOT 1.2 1.5* 1.2 1.3*  PROT 6.6 5.5* 5.4* 5.5*  ALBUMIN 3.2* 2.6* 2.7* 2.7*  Recent Labs Lab 08/13/16 1603  LIPASE 41   No results for input(s): AMMONIA in the last 168 hours. Coagulation Profile: No results for input(s): INR, PROTIME in the last 168 hours. Cardiac Enzymes: No results for input(s): CKTOTAL, CKMB, CKMBINDEX, TROPONINI in the last 168 hours. BNP (last 3 results) No results for input(s): PROBNP in the last 8760 hours. HbA1C: No results for input(s): HGBA1C in the last 72 hours. CBG:  Recent Labs Lab 08/17/16 1158 08/17/16 1719 08/17/16 2149 08/18/16 0737 08/18/16 1214  GLUCAP 126* 112* 120* 100* 108*   Lipid Profile: No results for input(s): CHOL, HDL, LDLCALC, TRIG, CHOLHDL, LDLDIRECT in the last 72 hours. Thyroid Function Tests: No results for input(s): TSH, T4TOTAL, FREET4, T3FREE, THYROIDAB in the last 72 hours. Anemia Panel: No results for input(s): VITAMINB12, FOLATE, FERRITIN, TIBC, IRON, RETICCTPCT in the last 72 hours. Sepsis Labs:  Recent Labs Lab 08/13/16 1603 08/13/16 1705 08/13/16 2115 08/13/16 2342  LATICACIDVEN 3.2* 4.15* 3.6* 2.5*    Recent Results (from the past 240 hour(s))  Urine culture     Status: Abnormal   Collection Time: 08/13/16  3:08 PM  Result Value Ref Range Status   Specimen Description URINE, RANDOM  Final   Special Requests NONE  Final   Culture <10,000 COLONIES/mL  INSIGNIFICANT GROWTH (A)  Final   Report Status 08/14/2016 FINAL  Final  Blood culture (routine x 2)     Status: None (Preliminary result)   Collection Time: 08/13/16  3:11 PM  Result Value Ref Range Status   Specimen Description BLOOD RIGHT HAND  Final   Special Requests BOTTLES DRAWN AEROBIC AND ANAEROBIC 5CC  Final   Culture NO GROWTH 4 DAYS  Final   Report Status PENDING  Incomplete  Blood culture (routine x 2)     Status: None (Preliminary result)   Collection Time: 08/13/16  6:15 PM  Result Value Ref Range Status   Specimen Description BLOOD LEFT ANTECUBITAL  Final   Special Requests BOTTLES DRAWN AEROBIC AND ANAEROBIC 5CC  Final   Culture NO GROWTH 4 DAYS  Final   Report Status PENDING  Incomplete  MRSA PCR Screening     Status: None   Collection Time: 08/14/16  8:01 AM  Result Value Ref Range Status   MRSA by PCR NEGATIVE NEGATIVE Final    Comment:        The GeneXpert MRSA Assay (FDA approved for NASAL specimens only), is one component of a comprehensive MRSA colonization surveillance program. It is not intended to diagnose MRSA infection nor to guide or monitor treatment for MRSA infections.          Radiology Studies: Ct Head Wo Contrast  Result Date: 08/18/2016 CLINICAL DATA:  Tremors for 2 months EXAM: CT HEAD WITHOUT CONTRAST TECHNIQUE: Contiguous axial images were obtained from the base of the skull through the vertex without intravenous contrast. COMPARISON:  None. FINDINGS: Brain: There is slight diffuse atrophy for age. Prominence of the cisterna magna is an anatomic variant. There is no intracranial mass, hemorrhage, extra-axial fluid collection, or midline shift. Gray-white compartments appear normal. No acute infarct evident. Vascular: There is no hyperdense vessel. There is no appreciable vascular calcification. Skull: The bony calvarium appears intact. There is calcification in the scalp in the right parietal region. Sinuses/Orbits: There is mild mucosal  thickening in several anterior ethmoid air cells. Visualized paranasal sinuses elsewhere are clear. Orbits appear symmetric bilaterally. Other: Visualized mastoid air cells are clear. IMPRESSION: Mild atrophy for age. No  intracranial mass, hemorrhage, or extra-axial fluid collection. Gray-white compartments appear normal. Mucosal thickening noted in several anterior ethmoid air cells. Calcification in the right parietal scalp region may represent residua of old trauma. No bony abnormality in this area. Electronically Signed   By: Bretta Bang III M.D.   On: 08/18/2016 09:52        Scheduled Meds: . [START ON 08/19/2016] carvedilol  3.125 mg Oral Daily  . enoxaparin (LOVENOX) injection  40 mg Subcutaneous Q24H  . folic acid  1 mg Oral Daily  . furosemide  20 mg Oral Daily  . insulin aspart  0-5 Units Subcutaneous QHS  . insulin aspart  0-9 Units Subcutaneous TID WC  . multivitamin with minerals  1 tablet Oral Daily  . sodium chloride flush  3 mL Intravenous Q12H  . [START ON 08/19/2016] spironolactone  12.5 mg Oral Daily  . thiamine injection  500 mg Intravenous TID   Continuous Infusions: . 0.9 % NaCl with KCl 20 mEq / L 50 mL/hr at 08/18/16 1130     LOS: 5 days      Huey Bienenstock, MD [facility] Pager [phone]  If 7PM-7AM, please contact night-coverage [email or web address] Password [email or web address] 08/18/2016, 2:04 PM

## 2016-08-18 NOTE — [place] Notes (Signed)
CSW met with pt and spoke with dtr over the phone with patient permission.  Pt admits to drinking 1/5 a week for the past year but is motivated to stop- pt reports that he hasn't had rehab before and this is a new problem for him over the past year.  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  No further needs at this time- CSW signing off  Jorge Ny, [place] Social Worker [phone]

## 2016-08-18 NOTE — Care Management Note (Signed)
Case Management Note  Patient Details  Name: [name] MRN: [id number] Date of Birth: 03-23-1961  Subjective/Objective:                 Spoke with patient at the bedside for DC planning. 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 Patient states he lives at home with 55 yr old son [name], and 22 yr old nephew who goes to [place]. During conversation was able to provide details of past surgical history, providers, and follow up. Patient stated he either drives himself or son drives to appointments. Patient requested AA and counselor information, Eileen Stanford CSW contacted to provide resources. Sister [name] [phone] to provide transportation at DC per pt.  Cardiologist Dr Shirlee Latch (per pt) PCP Jarrett Soho PA, Deboraha Sprang (pt reports last visit within a yr.) Dr Ladona Ridgel for ICD.    Action/Plan:  DC to home with OP PT (currently active)   Expected Discharge Date:                  Expected Discharge Plan:  Home/Self Care  In-House Referral:  Clinical Social Work  Discharge planning Services  CM Consult  Post Acute Care Choice:    Choice offered to:     DME Arranged:    DME Agency:     HH Arranged:    HH Agency:     Status of Service:  Completed, signed off  If discussed at [employer], dates discussed:    Additional Comments:  Lawerance Sabal, RN 08/18/2016, 3:23 PM

## 2016-08-18 NOTE — Progress Notes (Signed)
Physical Therapy Treatment Patient Details Name: Justin Bernard MRN: [id number] DOB: April 18, 1961 Today's Date: 09/07/16    History of Present Illness Pt adm with ETOH withdrawal, hypokalemia, and elevated lactate. PMH - HTN, DM, essential tremor, chf, rt humeral fx with mulltiple surgeries, back surgery    PT Comments    Pt performed increased mobility and demonstrated improved safety with rollator use.  Pt tolerated tx well and should be safe to d/c home from a mobility stand point.    Follow Up Recommendations  Outpatient PT;No PT follow up (resume if patient has adequate home support.  )     Equipment Recommendations  Rolling walker with 5" wheels    Recommendations for Other Services       Precautions / Restrictions Precautions Precautions: Fall Restrictions Weight Bearing Restrictions: No    Mobility  Bed Mobility               General bed mobility comments: Pt in chair on arrival.    Transfers Overall transfer level: Needs assistance Equipment used: 4-wheeled walker Transfers: Sit to/from Stand Sit to Stand: Modified independent (Device/Increase time)         General transfer comment: Pt able to recall and demonstrate locking brakes.    Ambulation/Gait Ambulation/Gait assistance: Supervision Ambulation Distance (Feet): 150 Feet Assistive device: 4-wheeled walker Gait Pattern/deviations: Step-through pattern;Trunk flexed Gait velocity: decr Gait velocity interpretation: Below normal speed for age/gender General Gait Details: Good technique with rollator cues for postural awareness.     Stairs            Wheelchair Mobility    Modified Rankin (Stroke Patients Only)       Balance Overall balance assessment: Needs assistance Sitting-balance support: No upper extremity supported Sitting balance-Leahy Scale: Good     Standing balance support: No upper extremity supported Standing balance-Leahy Scale: Fair                       Cognition Arousal/Alertness: Awake/alert Behavior During Therapy: Anxious Overall Cognitive Status: Within Functional Limits for tasks assessed                 General Comments: Required education that he cannot ambulate in halls in his underwear.      Exercises      General Comments        Pertinent Vitals/Pain Pain Assessment: No/denies pain    Home Living                      Prior Function            PT Goals (current goals can now be found in the care plan section) Acute Rehab PT Goals Patient Stated Goal: not stated Potential to Achieve Goals: Good Progress towards PT goals: Progressing toward goals    Frequency    Min 3X/week      PT Plan Current plan remains appropriate    Co-evaluation             End of Session Equipment Utilized During Treatment: Gait belt Activity Tolerance: Patient limited by fatigue Patient left: in chair;with call bell/phone within reach;with chair alarm set     Time: 7829-5621 PT Time Calculation (min) (ACUTE ONLY): 15 min  Charges:  $Gait Training: 8-22 mins                    G Codes:      Florestine Avers 09/07/16, 5:06 PM Konica Stankowski  Aundria Rud, [place] pager [phone]

## 2016-08-18 NOTE — Consult Note (Signed)
NEURO HOSPITALIST CONSULT NOTE   Requestig physician: Dr. Landis Gandy  Reason for Consult: Ataxia and gait imbalance.   History obtained from:  Patient    HPI:                                                                                                                                          Justin Bernard is an 56 y.o. male medical history significant forhypertension, essential tremor, nephrolithiasis, and chronic systolic CHF who presents to the emergency department with tremors, nausea, vomiting, and malaise. Admitted for alcohol abuse with withdrawals.  During my consultation patient history was not consistent and not detailed..  Per chart, "Patient reports that he suffers from essential tremor, but this has been much worse over the past couple days, over which time he is also developed nausea with dry heaving. The patient's ex-wife visited him today to discuss something about their children and noted him to look ill, dry heaving, diaphoretic, and shaking. She brought him into the emergency department for further evaluation of this. The patient initially denied any significant alcohol use, but later endorsed very mild consumption. He gave permission to discuss care with his ex-wife, who reports a strong suspicion for chronic daily heavy drinking for the past couple years at least. " When asking patient how much she drinks patient states that he has 2 shots at night and then wakes up in the middle of the night and has another shot to go back to sleep. He denies drinking heavily or having any excessive amount of alcohol. Patient denies stopping drinking abruptly recently.  On further interview with Neurology attending he endorses drinking 6 oz of vodka per day for the last 2 months. He states he does this to help him relax given significant life stressors including caring for a nephew, significant stress at work, an elderly mother who he cares for and recent divorce.   While  in the emergency room it was notable that patient was hyponatremic and hypochloremic with elevated serum transaminases. His alcohol level was elevated at 293, UDS was negative.   Currently patient is in his room and has very obvious tremors which are fine and notable in his hands and fingers, he is staring at his hands and stating that he sees different colors moving around on his hands and states that he feels he is having a rash that is moving from his hands to his arms and back to his hands. At times he is inappropriate and laughing when nothing is being said. He is very anxious appearing.  Patient doesn't endorse that he feels that he will fall if he goes to stand up by himself, when asked if he tends to go to the left or the right he feels he might be going to  the left more than the right but he just generally feels as though he is off balance.  Past Medical History:  Diagnosis Date  . AICD (automatic cardioverter/defibrillator) present   . Arthritis    L hip   . Asthma    pt. uses albuterol inhaler in the spring for enviromental allergies   . CHF (congestive heart failure) ([place])   . Chronic systolic heart failure (HCC)    NICM, previous heart transplant candidate with EF 10%  . Diabetes mellitus without complication ([place])   . Family history of adverse reaction to anesthesia    sister- headache & N&V  . History of hiatal hernia   . Hypertension   . Kidney stones 1997   passed spontaneously   . Neuromuscular disorder ([place])    essential tremor  . Pneumonia 2013   hosp. - IV antibiotics   . Presence of permanent cardiac pacemaker 2012   [employer]  . Shortness of breath dyspnea     Past Surgical History:  Procedure Laterality Date  . BACK SURGERY  07/2000   fusion , [place]   . BONE EXCISION Right 04/29/2016   Procedure: EXCISION HETEROTOPIC BONE RIGHT HUMERUS;  Surgeon: Altamese Worden, MD;  Location: [facility];  Service: Orthopedics;  Laterality: Right;  . CARDIAC  CATHETERIZATION  08/2012  . FRACTURE SURGERY Left    hip , r shoulder   . HARDWARE REMOVAL Right 04/29/2016   Procedure: HARDWARE REMOVAL;  Surgeon: Altamese Georgiana, MD;  Location: [facility];  Service: Orthopedics;  Laterality: Right;  . HERNIA REPAIR Right    inguinal   . HIP SURGERY Left    hardware present, for slipped joint   . ICD    . ORIF HUMERUS FRACTURE Right 07/20/2015   Procedure: OPEN REDUCTION INTERNAL FIXATION (ORIF) PROXIMAL HUMERUS FRACTURE;  Surgeon: Netta Cedars, MD;  Location: [facility];  Service: Orthopedics;  Laterality: Right;  . TONSILLECTOMY    . ulnar surgery Right     Family History  Problem Relation Age of Onset  . Heart failure Mother   . Hypertension Mother   . CAD Father   . Hypertension Other   . CAD Other      Social History:  reports that he has never smoked. His smokeless tobacco use includes Chew. He reports that he drinks alcohol. He reports that he does not use drugs.  Allergies  Allergen Reactions  . Chlorhexidine Itching  . Penicillins Other (See Comments)     childhood reaction, swelling & had to be given an antidote, but reports that he had antibiotic at a later time & it "had some penicillin in it & there was not any allergy reaction     MEDICATIONS:                                                                                                                     Prior to Admission:  Prescriptions Prior to Admission  Medication Sig Dispense Refill Last Dose  .  carvedilol (COREG) 6.25 MG tablet TAKE 1 TABLET BY MOUTH TWICE A DAY WITH A MEAL (Patient taking differently: TAKE 1 TABLET BY MOUTH ONCE DAILY) 60 tablet 1 08/12/2016 at 1000  . furosemide (LASIX) 40 MG tablet Take 20 mg by mouth 2 (two) times daily.    08/12/2016 at Unknown time  . glipiZIDE (GLUCOTROL XL) 5 MG 24 hr tablet Take 5 mg by mouth daily with breakfast.    08/12/2016 at Unknown time  . hydrocortisone cream 1 % Apply 1 application topically 2 (two) times daily as needed for  itching.   Past Month at Unknown time  . methocarbamol (ROBAXIN) 500 MG tablet Take 500 mg by mouth every 6 (six) hours as needed for muscle spasms.    08/12/2016 at Unknown time  . potassium chloride SA (KLOR-CON M20) 20 MEQ tablet Take 1 tablet (20 mEq total) by mouth 2 (two) times daily. 60 tablet 6 08/12/2016 at Unknown time  . spironolactone (ALDACTONE) 25 MG tablet Take 1 tablet (25 mg total) by mouth daily. 90 tablet 3 08/12/2016 at Unknown time  . glucose monitoring kit (FREESTYLE) monitoring kit 1 each by Does not apply route 4 (four) times daily - after meals and at bedtime. 1 month Diabetic Testing Supplies for QAC-QHS accuchecks. 1 each 1   . HYDROcodone-acetaminophen (NORCO) 7.5-325 MG tablet Take 1-2 tablets by mouth every 6 (six) hours as needed for moderate pain or severe pain. 70 tablet 0    Scheduled: . carvedilol  6.25 mg Oral Daily  . enoxaparin (LOVENOX) injection  40 mg Subcutaneous Q24H  . folic acid  1 mg Oral Daily  . furosemide  20 mg Oral Daily  . insulin aspart  0-5 Units Subcutaneous QHS  . insulin aspart  0-9 Units Subcutaneous TID WC  . multivitamin with minerals  1 tablet Oral Daily  . sodium chloride flush  3 mL Intravenous Q12H  . spironolactone  25 mg Oral Daily  . thiamine injection  500 mg Intravenous TID     ROS:                                                                                                                                       History obtained from the patient  General ROS: negative for - chills, fatigue, fever, night sweats, weight gain or weight loss Psychological ROS: negative for - behavioral disorder, hallucinations, memory difficulties, mood swings or suicidal ideation Ophthalmic ROS: negative for - blurry vision, double vision, eye pain or loss of vision ENT ROS: negative for - epistaxis, nasal discharge, oral lesions, sore throat, tinnitus or vertigo Allergy and Immunology ROS: negative for - hives or itchy/watery  eyes Hematological and Lymphatic ROS: negative for - bleeding problems, bruising or swollen lymph nodes Endocrine ROS: negative for - galactorrhea, hair pattern changes, polydipsia/polyuria or temperature intolerance Respiratory ROS: negative for - cough, hemoptysis, shortness of breath or wheezing  Cardiovascular ROS: negative for - chest pain, dyspnea on exertion, edema or irregular heartbeat Gastrointestinal ROS: negative for - abdominal pain, diarrhea, hematemesis, nausea/vomiting or stool incontinence Genito-Urinary ROS: negative for - dysuria, hematuria, incontinence or urinary frequency/urgency Musculoskeletal ROS: negative for - joint swelling or muscular weakness Neurological ROS: as noted in HPI Dermatological ROS: negative for rash and skin lesion changes   Blood pressure 96/61, pulse 85, temperature 99.5 F (37.5 C), temperature source Oral, resp. rate 18, height 6' 4" (1.93 m), weight 119.7 kg (264 lb), SpO2 96 %.   Neurologic Examination:                                                                                                      HEENT-  Normocephalic, no lesions, without obvious abnormality.  Normal external eye and conjunctiva.  Normal TM's bilaterally.  Normal auditory canals and external ears. Normal external nose, mucus membranes and septum.  Normal pharynx. Cardiovascular- S1, S2 normal, pulses palpable throughout   Lungs- chest clear, no wheezing, rales, normal symmetric air entry, Heart exam - S1, S2 normal, no murmur, no gallop, rate regular Abdomen- normal findings: bowel sounds normal Extremities- no edema Lymph-no adenopathy palpable Musculoskeletal-no joint tenderness, deformity or swelling Skin-warm and dry, no hyperpigmentation, vitiligo, or suspicious lesions  Neurological Examination Mental Status: Currently patient is alert and oriented. He is able to follow commands. He isn't anxious demeanor. His speech is fluent without any aphasia. Cranial  Nerves: II:  Visual fields grossly normal, III,IV, VI: ptosis not present, extra-ocular motions intact bilaterally, pupils equal, round, reactive to light and accommodation--I do not know any significant nystagmus. But when his following my finger it is obvious that he has both peri-ocular and tremors at the corner of his mouth. V,VII: smile symmetric, facial light touch sensation normal bilaterally VIII: hearing normal bilaterally IX,X: uvula rises symmetrically XI: bilateral shoulder shrug XII: midline tongue extension Motor: Right : Upper extremity   5/5    Left:     Upper extremity   5/5  Lower extremity   5/5     Lower extremity   5/5 Tone and bulk:normal tone throughout; no atrophy noted Sensory: Pinprick and light touch intact throughout, bilaterally--he does have decreased sensation to tuning fork and decreased sensation to cold temperature up to his knees. Proprioception appears to be intact however I believe at times he is guessing Deep Tendon Reflexes: 1+ and symmetric throughout Plantars: Right: downgoing   Left: downgoing Cerebellar: normal finger-to-nose--with postural tremor, normal heel-to-shin test-- and some dysmetria Gait: Patient had a difficult time standing up without assistance however once up he did have a wide-based gait along with short shuffling steps. He did not lean to one side or the other and he did not complain of any dizziness or vertigo.    Lab Results: Basic Metabolic Panel:  Recent Labs Lab 08/13/16 1351 08/13/16 1436 08/14/16 0218 08/15/16 0851 08/15/16 1840 08/17/16 0800  NA 134* 134* 133* 131* 132* 135  K 4.0 3.9 3.2* 3.4* 3.5 3.9  CL 97* 95* 101 101 100* 106  CO2  25  --  20* 23 19* 22  GLUCOSE 113* 114* 77 165* 194* 106*  BUN _0 CREATININE 0.88 1.10 0.91 0.76 0.78 0.73  CALCIUM 10.0  --  8.0* 7.8* 7.5* 8.0*  MG  --   --  1.2* 1.4*  --   --     Liver Function Tests:  Recent Labs Lab 08/13/16 1351 08/14/16 0218  08/15/16 1840 08/17/16 0800  AST 109* 100* 103* 115*  ALT 66* 51 48 51  ALKPHOS 122 100 103 115  BILITOT 1.2 1.5* 1.2 1.3*  PROT 6.6 5.5* 5.4* 5.5*  ALBUMIN 3.2* 2.6* 2.7* 2.7*    Recent Labs Lab 08/13/16 1603  LIPASE 41   No results for input(s): AMMONIA in the last 168 hours.  CBC:  Recent Labs Lab 08/13/16 1351 08/13/16 1436 08/14/16 0218 08/15/16 1840 08/16/16 0520 08/17/16 0800  WBC 12.1*  --  8.4 5.7 6.3 6.4  NEUTROABS  --   --  7.1 3.7 3.9 3.8  HGB 13.2 13.3 11.2* 11.1* 11.2* 11.8*  HCT 37.2* 39.0 31.9* 31.4* 33.0* 35.1*  MCV 104.8*  --  105.6* 106.4* 107.5* 109.0*  PLT 234  --  171 177 192 229    Cardiac Enzymes: No results for input(s): CKTOTAL, CKMB, CKMBINDEX, TROPONINI in the last 168 hours.  Lipid Panel: No results for input(s): CHOL, TRIG, HDL, CHOLHDL, VLDL, LDLCALC in the last 168 hours.  CBG:  Recent Labs Lab 08/17/16 0755 08/17/16 1158 08/17/16 1719 08/17/16 2149 08/18/16 0737  GLUCAP 101* 126* 112* 120* 100*    Microbiology: Results for orders placed or performed during the hospital encounter of 08/13/16  Urine culture     Status: Abnormal   Collection Time: 08/13/16  3:08 PM  Result Value Ref Range Status   Specimen Description URINE, RANDOM  Final   Special Requests NONE  Final   Culture <10,000 COLONIES/mL INSIGNIFICANT GROWTH (A)  Final   Report Status 08/14/2016 FINAL  Final  Blood culture (routine x 2)     Status: None (Preliminary result)   Collection Time: 08/13/16  3:11 PM  Result Value Ref Range Status   Specimen Description BLOOD RIGHT HAND  Final   Special Requests BOTTLES DRAWN AEROBIC AND ANAEROBIC 5CC  Final   Culture NO GROWTH 4 DAYS  Final   Report Status PENDING  Incomplete  Blood culture (routine x 2)     Status: None (Preliminary result)   Collection Time: 08/13/16  6:15 PM  Result Value Ref Range Status   Specimen Description BLOOD LEFT ANTECUBITAL  Final   Special Requests BOTTLES DRAWN AEROBIC AND  ANAEROBIC 5CC  Final   Culture NO GROWTH 4 DAYS  Final   Report Status PENDING  Incomplete  MRSA PCR Screening     Status: None   Collection Time: 08/14/16  8:01 AM  Result Value Ref Range Status   MRSA by PCR NEGATIVE NEGATIVE Final    Comment:        The GeneXpert MRSA Assay (FDA approved for NASAL specimens only), is one component of a comprehensive MRSA colonization surveillance program. It is not intended to diagnose MRSA infection nor to guide or monitor treatment for MRSA infections.     Coagulation Studies: No results for input(s): LABPROT, INR in the last 72 hours.  Imaging: Ct Head Wo Contrast  Result Date: 08/18/2016 CLINICAL DATA:  Tremors for 2 months EXAM: CT HEAD WITHOUT CONTRAST TECHNIQUE: Contiguous axial images were obtained from  the base of the skull through the vertex without intravenous contrast. COMPARISON:  None. FINDINGS: Brain: There is slight diffuse atrophy for age. Prominence of the cisterna magna is an anatomic variant. There is no intracranial mass, hemorrhage, extra-axial fluid collection, or midline shift. Gray-white compartments appear normal. No acute infarct evident. Vascular: There is no hyperdense vessel. There is no appreciable vascular calcification. Skull: The bony calvarium appears intact. There is calcification in the scalp in the right parietal region. Sinuses/Orbits: There is mild mucosal thickening in several anterior ethmoid air cells. Visualized paranasal sinuses elsewhere are clear. Orbits appear symmetric bilaterally. Other: Visualized mastoid air cells are clear. IMPRESSION: Mild atrophy for age. No intracranial mass, hemorrhage, or extra-axial fluid collection. Gray-white compartments appear normal. Mucosal thickening noted in several anterior ethmoid air cells. Calcification in the right parietal scalp region may represent residua of old trauma. No bony abnormality in this area. Electronically Signed   By: Lowella Grip III M.D.   On:  08/18/2016 09:52   Impression: 1. AMS. DDx includes Wernicke encephalopathy and EtOH withdrawal. No lateralizing findings to suggest stroke.  2. CT head with no acute abnormality. Atrophy noted.  3. Vitamin B12 is borderline low by Neurological standards.  4. 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 Most likely secondary to heavy chronic alcohol use given his medical history.    Recommendations: 1. Continue high dose IV thiamine for at least 3 days. At discharge, will need to be maintained indefinitely on oral thiamine 100 mg po qd.  2. Unable to perform MRI due to pacemaker.  3. Start vitamin B12 supplementation 1 mg po qd.  4. Psychology evaluation regarding life stressors contributing to his heavy recent EtOH use.  5. EtOH cessation counseling. 6. Continue CIWA protocol.  7. Would hold off on discharge until tremors and visual hallucinations subside.  8. Outpatient Neurology follow up.   The Neurology attending has seen and examined the patient. History and physical examination documented by Etta Quill PA-C, [facility], [phone]  Electronically signed: Dr. Kerney Elbe 08/18/2016, 11:32 AM

## 2016-08-19 LAB — BASIC METABOLIC PANEL
ANION GAP: 7 (ref 5–15)
BUN: 8 mg/dL (ref 6–20)
CHLORIDE: 106 mmol/L (ref 101–111)
CO2: 22 mmol/L (ref 22–32)
Calcium: 8.7 mg/dL — ABNORMAL LOW (ref 8.9–10.3)
Creatinine, Ser: 0.81 mg/dL (ref 0.61–1.24)
GFR calc Af Amer: >60 mL/min (ref >60)
GLUCOSE: 104 mg/dL — AB (ref 65–99)
POTASSIUM: 3.4 mmol/L — AB (ref 3.5–5.1)
Sodium: 135 mmol/L (ref 135–145)

## 2016-08-19 LAB — GLUCOSE, CAPILLARY
GLUCOSE-CAPILLARY: 99 mg/dL (ref 65–99)
GLUCOSE-CAPILLARY: 112 mg/dL — AB (ref 65–99)
GLUCOSE-CAPILLARY: 123 mg/dL — AB (ref 65–99)
Glucose-Capillary: 129 mg/dL — ABNORMAL HIGH (ref 65–99)

## 2016-08-19 LAB — ECHOCARDIOGRAM COMPLETE
HEIGHTINCHES: 76 in
WEIGHTICAEL: 4224 [oz_av]

## 2016-08-19 LAB — CBC
HCT: 34.8 % — ABNORMAL LOW (ref 39.0–52.0)
HEMOGLOBIN: 11.9 g/dL — AB (ref 13.0–17.0)
MCH: 37.4 pg — ABNORMAL HIGH (ref 26.0–34.0)
MCHC: 34.2 g/dL (ref 30.0–36.0)
MCV: 109.4 fL — AB (ref 78.0–100.0)
PLATELETS: 299 10*3/uL (ref 150–400)
RBC: 3.18 MIL/uL — AB (ref 4.22–5.81)
RDW: 15.2 % (ref 11.5–15.5)
WBC: 7.8 10*3/uL (ref 4.0–10.5)

## 2016-08-19 MED ORDER — POTASSIUM CHLORIDE CRYS ER 20 MEQ PO TBCR
40.0000 meq | EXTENDED_RELEASE_TABLET | Freq: Once | ORAL | Status: AC
  Administered 2016-08-19: 40 meq via ORAL
  Filled 2016-08-19: qty 2

## 2016-08-19 MED ORDER — CYANOCOBALAMIN 1000 MCG/ML IJ SOLN
1000.0000 ug | Freq: Every day | INTRAMUSCULAR | Status: AC
  Administered 2016-08-19 – 2016-08-21 (×3): 1000 ug via INTRAMUSCULAR
  Filled 2016-08-19 (×3): qty 1

## 2016-08-19 NOTE — Progress Notes (Signed)
PROGRESS NOTE    Justin Bernard  XBM:[id number] DOB: 13-Feb-1961 DOA: 08/13/2016 PCP: No PCP Per Patient    Brief Narrative:  Justin Bernard is a 56 y.o. male with medical history significant for hypertension, essential tremor, nephrolithiasis, and chronic systolic CHF who presents to the emergency department with tremors, nausea, vomiting, and malaise. Admitted for alcohol abuse with withdrawals   Assessment & Plan:   Principal Problem:   Alcohol withdrawal delirium (HCC) Active Problems:   Hypotension   Chronic systolic CHF (congestive heart failure), NYHA class 2 (HCC)   Hyponatremia   Elevated serum lactate dehydrogenase   Nausea & vomiting   SIRS (systemic inflammatory response syndrome) (HCC)   Diabetes mellitus type II, non insulin dependent (HCC)   Alcohol abuse with intoxication with complication (HCC)   Alcohol withdrawal delirium, acute, hyperactive (HCC)   Alcohol abuse with withdrawal, complicated - Pt initially denies any EtOH use, later endorses 1-2 drinks 3x/wk, but EtOH level is 193, family suspects heavy daily drinking - continue with CIWA , no Ativan requirement of the last 24 hours - provide supportive care with IVF, vitamins & minerals, anti-emetics - Social work consultation requested   - Patient with ataxia and unsteady gait, CT head with no acute findings, seen by neurology, suspicion for Wernicke's  encephalopathy currently on thiamine 500 mg IV 3 times a day, to finish total of 3 days , as well with low B-12 level, started on IM supplement patient with significant macrocytosis,.  Hypotension, elevated lactate  - Initial BP 66/53, responded well to IVF and has remained stable since  - Appears to be improving, appears to be withdrawals, nausea and vomiting on presentation and decreased oral intake - Blood pressure more stable currently, DC IV fluid as he started to develop lower extremity edema  Nausea, vomiting - CTA abdomen notable for fatty  infiltration of liver and nephrolithiasis, but no bowel obstruction, arterial stenosis, dissection, or apparent source of infection - Most likely due to alcohol withdrawal, resolved  Hypokalemia and Hypomagnesemia - Repeat as needed, recheck phosphorus level  Chronic systolic CHF   - Pt reportedly had non-ischemic cardiomyopathy with EF 10% while living in [place], had ICD placed, and was considered for heart transplant  - He has since been evaluated by cardiology in [place] with near-normalization in systolic function  - TTE (07/18/15) with EF 50-55%, mild LVH, no diastolic dysfunction, and no significant valvular disease  - Taking Lasix, Aldactone, and Coreg at home - Improving Blood pressure, will resume Aldactone and Coreg at half dose given soft blood pressure, continue with Lasix - Recheck 2-D echo given persistence of blood pressure  Type II DM  - A1c was 5.0% in September 2017, was 8.2% December 2016 - Managed with glipizide only at home; this will be held  - Check CBG with meals and qHS  - on  low-intensity SSI only and adjust prn    Nephrolithiasis  - Pt with hx of nephrolithiasis, has passed stones spontaneously  - CTA from ED notable for bilateral nephrolithiasis, including a 27 x 17 mm stone in left renal pelvis and upper pole resulting in severe dilation of left upper pole  - There is no abd or CVA tenderness, no urinary sxs, UA not c/w infection, and renal function remains normal and at baseline   -  will need non-emergent urology evaluation   DVT prophylaxis: sq Lovenox Code Status: Full  Family Communication:  discussed with daughter via phone 1/4 Disposition Plan:  home in  2 days Admission status: Inpatient     Consultants:   None  Procedures:   None  Antimicrobials:   Cefepime 12/30  Vancomycin 12/30    Subjective: he feels better today , tremors are improving, but reports this is chronic problem over the last year , denies any chest  pain, shortness of breath  Objective: Vitals:   08/18/16 1355 08/18/16 2118 08/19/16 0500 08/19/16 1410  BP: 109/73 (!) 149/87 117/70 (!) 99/51  Pulse: 73 88 99 88  Resp: 20 18 20 20   Temp: 98.6 F (37 C) 98.9 F (37.2 C) 98.2 F (36.8 C) 98.6 F (37 C)  TempSrc: Oral Oral Oral Oral  SpO2: 99% 97% 100%   Weight:   119.6 kg (263 lb 10.7 oz)   Height:        Intake/Output Summary (Last 24 hours) at 08/19/16 1554 Last data filed at 08/19/16 1411  Gross per 24 hour  Intake             1870 ml  Output              250 ml  Net             1620 ml   Filed Weights   08/16/16 1744 08/18/16 0540 08/19/16 0500  Weight: 119.7 kg (263 lb 14.3 oz) 119.7 kg (264 lb) 119.6 kg (263 lb 10.7 oz)    Examination:  General exam: Appears calm and comfortable , More appropriate Respiratory system: Clear to auscultation. Respiratory effort normal. Cardiovascular system: S1 & S2 heard, RRR. No JVD, murmurs, rubs, gallops or clicks.+1  edema. Gastrointestinal system: Abdomen is nondistended, soft and nontender. No organomegaly or masses felt. Normal bowel sounds heard. Central nervous system: Alert and oriented. Significant bilateral tremor, no focal neurologic deficiets Extremities: Symmetric 5 x 5 power. Skin: Numerous small lesions on arms, upper back, lower back, buttocks, thighs and legs noted.  Patient states they are itchy.  No lesions noted between fingers or toes.  Ecchymosis on inner left knee noted Psychiatry: more appropriate insight, appropriate affect     Data Reviewed: I have personally reviewed following labs and imaging studies  CBC:  Recent Labs Lab 08/14/16 0218 08/15/16 1840 08/16/16 0520 08/17/16 0800 08/19/16 0649  WBC 8.4 5.7 6.3 6.4 7.8  NEUTROABS 7.1 3.7 3.9 3.8  --   HGB 11.2* 11.1* 11.2* 11.8* 11.9*  HCT 31.9* 31.4* 33.0* 35.1* 34.8*  MCV 105.6* 106.4* 107.5* 109.0* 109.4*  PLT 171 177 192 229 299   Basic Metabolic Panel:  Recent Labs Lab 08/14/16 0218  08/15/16 0851 08/15/16 1840 08/17/16 0800 08/18/16 1456 08/19/16 0649  NA 133* 131* 132* 135 137 135  K 3.2* 3.4* 3.5 3.9 3.5 3.4*  CL 101 101 100* 106 107 106  CO2 20* 23 19* 22 23 22   GLUCOSE 77 165* 194* 106* 121* 104*  BUN 12 8 9 6 6 8   CREATININE 0.91 0.76 0.78 0.73 1.03 0.81  CALCIUM 8.0* 7.8* 7.5* 8.0* 8.3* 8.7*  MG 1.2* 1.4*  --   --  1.7  --   PHOS  --   --   --   --  2.5  --    GFR: Estimated Creatinine Clearance: 147.3 mL/min (by C-G formula based on SCr of 0.81 mg/dL). Liver Function Tests:  Recent Labs Lab 08/13/16 1351 08/14/16 0218 08/15/16 1840 08/17/16 0800  AST 109* 100* 103* 115*  ALT 66* 51 48 51  ALKPHOS 122 100 103 115  BILITOT  1.2 1.5* 1.2 1.3*  PROT 6.6 5.5* 5.4* 5.5*  ALBUMIN 3.2* 2.6* 2.7* 2.7*    Recent Labs Lab 08/13/16 1603  LIPASE 41   No results for input(s): AMMONIA in the last 168 hours. Coagulation Profile: No results for input(s): INR, PROTIME in the last 168 hours. Cardiac Enzymes: No results for input(s): CKTOTAL, CKMB, CKMBINDEX, TROPONINI in the last 168 hours. BNP (last 3 results) No results for input(s): PROBNP in the last 8760 hours. HbA1C: No results for input(s): HGBA1C in the last 72 hours. CBG:  Recent Labs Lab 08/18/16 1214 08/18/16 1705 08/18/16 2116 08/19/16 0811 08/19/16 1149  GLUCAP 108* 127* 117* 99 129*   Lipid Profile: No results for input(s): CHOL, HDL, LDLCALC, TRIG, CHOLHDL, LDLDIRECT in the last 72 hours. Thyroid Function Tests: No results for input(s): TSH, T4TOTAL, FREET4, T3FREE, THYROIDAB in the last 72 hours. Anemia Panel:  Recent Labs  08/18/16 1456  VITAMINB12 369  FOLATE 10.6   Sepsis Labs:  Recent Labs Lab 08/13/16 1603 08/13/16 1705 08/13/16 2115 08/13/16 2342  LATICACIDVEN 3.2* 4.15* 3.6* 2.5*    Recent Results (from the past 240 hour(s))  Urine culture     Status: Abnormal   Collection Time: 08/13/16  3:08 PM  Result Value Ref Range Status   Specimen Description  URINE, RANDOM  Final   Special Requests NONE  Final   Culture <10,000 COLONIES/mL INSIGNIFICANT GROWTH (A)  Final   Report Status 08/14/2016 FINAL  Final  Blood culture (routine x 2)     Status: None   Collection Time: 08/13/16  3:11 PM  Result Value Ref Range Status   Specimen Description BLOOD RIGHT HAND  Final   Special Requests BOTTLES DRAWN AEROBIC AND ANAEROBIC 5CC  Final   Culture NO GROWTH 5 DAYS  Final   Report Status 08/18/2016 FINAL  Final  Blood culture (routine x 2)     Status: None   Collection Time: 08/13/16  6:15 PM  Result Value Ref Range Status   Specimen Description BLOOD LEFT ANTECUBITAL  Final   Special Requests BOTTLES DRAWN AEROBIC AND ANAEROBIC 5CC  Final   Culture NO GROWTH 5 DAYS  Final   Report Status 08/18/2016 FINAL  Final  MRSA PCR Screening     Status: None   Collection Time: 08/14/16  8:01 AM  Result Value Ref Range Status   MRSA by PCR NEGATIVE NEGATIVE Final    Comment:        The GeneXpert MRSA Assay (FDA approved for NASAL specimens only), is one component of a comprehensive MRSA colonization surveillance program. It is not intended to diagnose MRSA infection nor to guide or monitor treatment for MRSA infections.          Radiology Studies: Ct Head Wo Contrast  Result Date: 08/18/2016 CLINICAL DATA:  Tremors for 2 months EXAM: CT HEAD WITHOUT CONTRAST TECHNIQUE: Contiguous axial images were obtained from the base of the skull through the vertex without intravenous contrast. COMPARISON:  None. FINDINGS: Brain: There is slight diffuse atrophy for age. Prominence of the cisterna magna is an anatomic variant. There is no intracranial mass, hemorrhage, extra-axial fluid collection, or midline shift. Gray-white compartments appear normal. No acute infarct evident. Vascular: There is no hyperdense vessel. There is no appreciable vascular calcification. Skull: The bony calvarium appears intact. There is calcification in the scalp in the right  parietal region. Sinuses/Orbits: There is mild mucosal thickening in several anterior ethmoid air cells. Visualized paranasal sinuses elsewhere are clear. Orbits  appear symmetric bilaterally. Other: Visualized mastoid air cells are clear. IMPRESSION: Mild atrophy for age. No intracranial mass, hemorrhage, or extra-axial fluid collection. Gray-white compartments appear normal. Mucosal thickening noted in several anterior ethmoid air cells. Calcification in the right parietal scalp region may represent residua of old trauma. No bony abnormality in this area. Electronically Signed   By: Bretta Bang III M.D.   On: 08/18/2016 09:52        Scheduled Meds: . carvedilol  3.125 mg Oral Daily  . cyanocobalamin  1,000 mcg Intramuscular Daily  . enoxaparin (LOVENOX) injection  40 mg Subcutaneous Q24H  . folic acid  1 mg Oral Daily  . furosemide  20 mg Oral Daily  . insulin aspart  0-5 Units Subcutaneous QHS  . insulin aspart  0-9 Units Subcutaneous TID WC  . multivitamin with minerals  1 tablet Oral Daily  . sodium chloride flush  3 mL Intravenous Q12H  . spironolactone  12.5 mg Oral Daily  . thiamine injection  500 mg Intravenous TID   Continuous Infusions:    LOS: 6 days      Huey Bienenstock, MD [facility] Pager [phone]  If 7PM-7AM, please contact night-coverage [email or web address] Password [facility] 08/19/2016, 3:54 PM

## 2016-08-20 LAB — GLUCOSE, CAPILLARY
GLUCOSE-CAPILLARY: 110 mg/dL — AB (ref 65–99)
GLUCOSE-CAPILLARY: 128 mg/dL — AB (ref 65–99)
Glucose-Capillary: 105 mg/dL — ABNORMAL HIGH (ref 65–99)
Glucose-Capillary: 106 mg/dL — ABNORMAL HIGH (ref 65–99)

## 2016-08-20 LAB — CREATININE, SERUM
Creatinine, Ser: 0.69 mg/dL (ref 0.61–1.24)
GFR calc Af Amer: >60 mL/min (ref >60)
GFR calc non Af Amer: >60 mL/min (ref >60)

## 2016-08-20 MED ORDER — FUROSEMIDE 10 MG/ML IJ SOLN
20.0000 mg | Freq: Once | INTRAMUSCULAR | Status: AC
  Administered 2016-08-20: 20 mg via INTRAVENOUS
  Filled 2016-08-20: qty 2

## 2016-08-20 MED ORDER — THIAMINE HCL 100 MG/ML IJ SOLN
500.0000 mg | Freq: Three times a day (TID) | INTRAVENOUS | Status: AC
  Administered 2016-08-21: 500 mg via INTRAVENOUS
  Filled 2016-08-20: qty 5

## 2016-08-20 MED ORDER — POTASSIUM CHLORIDE CRYS ER 20 MEQ PO TBCR
20.0000 meq | EXTENDED_RELEASE_TABLET | Freq: Once | ORAL | Status: AC
  Administered 2016-08-20: 20 meq via ORAL
  Filled 2016-08-20: qty 1

## 2016-08-20 MED ORDER — GABAPENTIN 600 MG PO TABS
300.0000 mg | ORAL_TABLET | Freq: Three times a day (TID) | ORAL | Status: DC
  Administered 2016-08-20 – 2016-08-21 (×3): 300 mg via ORAL
  Filled 2016-08-20 (×3): qty 1

## 2016-08-20 NOTE — Progress Notes (Signed)
Patient reports that he is much better, almost at baseline and continuing to improve. I think that the exact etiology of his worsening gait is unclear, but he does not have a normal gait at baseline. He has essential tremor, which is been a problem for years.  1) gabapentin 300 mg 3 times a day for essential tremor 2) continue thiamine, 100 mg a day after 3 days of high-dose 3) continue working with physical therapy  4) no further recommendations at this time, neurology will sign off, please call with any further questions or concerns.  Ritta Slot, MD [facility] [phone]  If 7pm- 7am, please page neurology on call as listed in AMION.

## 2016-08-20 NOTE — Progress Notes (Signed)
PROGRESS NOTE    Justin Bernard  HDQ:[id number] DOB: 1960/10/05 DOA: 08/13/2016 PCP: No PCP Per Patient    Brief Narrative:  Justin Bernard is a 56 y.o. male with medical history significant for hypertension, essential tremor, nephrolithiasis, and chronic systolic CHF who presents to the emergency department with tremors, nausea, vomiting, and malaise. Admitted for alcohol abuse with withdrawals   Assessment & Plan:   Principal Problem:   Alcohol withdrawal delirium (HCC) Active Problems:   Hypotension   Chronic systolic CHF (congestive heart failure), NYHA class 2 (HCC)   Hyponatremia   Elevated serum lactate dehydrogenase   Nausea & vomiting   SIRS (systemic inflammatory response syndrome) (HCC)   Diabetes mellitus type II, non insulin dependent (HCC)   Alcohol abuse with intoxication with complication (HCC)   Alcohol withdrawal delirium, acute, hyperactive (HCC)   Alcohol abuse with withdrawal, complicated - Pt initially denies any EtOH use, later endorses 1-2 drinks 3x/wk, but EtOH level is 193, family suspects heavy daily drinking - continue with CIWA , no Ativan requirement of the last 24 hours - provide supportive care with IVF, vitamins & minerals, anti-emetics - Social work consultation requested   - Patient with ataxia and unsteady gait, CT head with no acute findings, seen by neurology, suspicion for Wernicke's  encephalopathy currently on thiamine 500 mg IV 3 times a day, to finish total of 3 days , as well with low B-12 level, started on IM supplement patient with significant macrocytosis,.  Hypotension, elevated lactate  - Initial BP 66/53, responded well to IVF and has remained stable since  - Appears to be improving, appears to be withdrawals, nausea and vomiting on presentation and decreased oral intake - Blood pressure more stable currently, DC IV fluid as he started to develop lower extremity edema  Nausea, vomiting - CTA abdomen notable for fatty  infiltration of liver and nephrolithiasis, but no bowel obstruction, arterial stenosis, dissection, or apparent source of infection - Most likely due to alcohol withdrawal, resolved  Hypokalemia and Hypomagnesemia - Repeat as needed  Chronic systolic CHF   - Pt reportedly had non-ischemic cardiomyopathy with EF 10% while living in [place], had ICD placed, and was considered for heart transplant  - He has since been evaluated by cardiology in [place] with near-normalization in systolic function  - TTE (07/18/15) with EF 50-55%, mild LVH, no diastolic dysfunction, and no significant valvular disease  - Taking Lasix, Aldactone, and Coreg at home - Repeat 2-D echo this admission with EF 40-45%, which is dropped from his most recent echo, and able to start on ACEI/ARB given very soft blood pressure, unable to up titrate his beta blockers, will continue with current regimen of Coreg, Lasix and Aldactone  Type II DM  - A1c was 5.0% in September 2017, was 8.2% December 2016 - Managed with glipizide only at home; this will be held  - Check CBG with meals and qHS  - on  low-intensity SSI only and adjust prn    Nephrolithiasis  - Pt with hx of nephrolithiasis, has passed stones spontaneously  - CTA from ED notable for bilateral nephrolithiasis, including a 27 x 17 mm stone in left renal pelvis and upper pole resulting in severe dilation of left upper pole  - There is no abd or CVA tenderness, no urinary sxs, UA not c/w infection, and renal function remains normal and at baseline   -  will need non-emergent urology evaluation   DVT prophylaxis: sq Lovenox Code Status: Full  Family Communication:  discussed with daughter via phone 1/4 Disposition Plan:  home tomorrow after he finished IV thiamine Admission status: Inpatient     Consultants:   None  Procedures:   None  Antimicrobials:   Cefepime 12/30  Vancomycin 12/30    Subjective: he feels better today , tremors are  improving, but reports this is chronic problem over the last year , denies any chest pain, shortness of breath  Objective: Vitals:   08/19/16 1410 08/19/16 2026 08/20/16 0017 08/20/16 0414  BP: (!) 99/51 106/63 105/68 105/63  Pulse: 88 83 84 83  Resp: 20 18 18 18   Temp: 98.6 F (37 C) 98.1 F (36.7 C) 98 F (36.7 C) 98.1 F (36.7 C)  TempSrc: Oral Oral Oral Oral  SpO2:  100% 99% 98%  Weight:    119.7 kg (264 lb)  Height:        Intake/Output Summary (Last 24 hours) at 08/20/16 1236 Last data filed at 08/20/16 0900  Gross per 24 hour  Intake             1770 ml  Output                0 ml  Net             1770 ml   Filed Weights   08/18/16 0540 08/19/16 0500 08/20/16 0414  Weight: 119.7 kg (264 lb) 119.6 kg (263 lb 10.7 oz) 119.7 kg (264 lb)    Examination:  General exam: Appears calm and comfortable , More appropriate Respiratory system: Clear to auscultation. Respiratory effort normal. Cardiovascular system: S1 & S2 heard, RRR. No JVD, murmurs, rubs, gallops or clicks.+1  edema. Gastrointestinal system: Abdomen is nondistended, soft and nontender. No organomegaly or masses felt. Normal bowel sounds heard. Central nervous system: Alert and oriented. Significant bilateral tremor, no focal neurologic deficiets Extremities: Symmetric 5 x 5 power. Skin: Numerous small lesions on arms, upper back, lower back, buttocks, thighs and legs noted.  Patient states they are itchy.  No lesions noted between fingers or toes.  Ecchymosis on inner left knee noted Psychiatry: more appropriate insight, appropriate affect     Data Reviewed: I have personally reviewed following labs and imaging studies  CBC:  Recent Labs Lab 08/14/16 0218 08/15/16 1840 08/16/16 0520 08/17/16 0800 08/19/16 0649  WBC 8.4 5.7 6.3 6.4 7.8  NEUTROABS 7.1 3.7 3.9 3.8  --   HGB 11.2* 11.1* 11.2* 11.8* 11.9*  HCT 31.9* 31.4* 33.0* 35.1* 34.8*  MCV 105.6* 106.4* 107.5* 109.0* 109.4*  PLT 171 177 192 229  299   Basic Metabolic Panel:  Recent Labs Lab 08/14/16 0218 08/15/16 0851 08/15/16 1840 08/17/16 0800 08/18/16 1456 08/19/16 0649 08/20/16 0737  NA 133* 131* 132* 135 137 135  --   K 3.2* 3.4* 3.5 3.9 3.5 3.4*  --   CL 101 101 100* 106 107 106  --   CO2 20* 23 19* 22 23 22   --   GLUCOSE 77 165* 194* 106* 121* 104*  --   BUN 12 8 9 6 6 8   --   CREATININE 0.91 0.76 0.78 0.73 1.03 0.81 0.69  CALCIUM 8.0* 7.8* 7.5* 8.0* 8.3* 8.7*  --   MG 1.2* 1.4*  --   --  1.7  --   --   PHOS  --   --   --   --  2.5  --   --    GFR: Estimated Creatinine Clearance: 149.3 mL/min (  by C-G formula based on SCr of 0.69 mg/dL). Liver Function Tests:  Recent Labs Lab 08/13/16 1351 08/14/16 0218 08/15/16 1840 08/17/16 0800  AST 109* 100* 103* 115*  ALT 66* 51 48 51  ALKPHOS 122 100 103 115  BILITOT 1.2 1.5* 1.2 1.3*  PROT 6.6 5.5* 5.4* 5.5*  ALBUMIN 3.2* 2.6* 2.7* 2.7*    Recent Labs Lab 08/13/16 1603  LIPASE 41   No results for input(s): AMMONIA in the last 168 hours. Coagulation Profile: No results for input(s): INR, PROTIME in the last 168 hours. Cardiac Enzymes: No results for input(s): CKTOTAL, CKMB, CKMBINDEX, TROPONINI in the last 168 hours. BNP (last 3 results) No results for input(s): PROBNP in the last 8760 hours. HbA1C: No results for input(s): HGBA1C in the last 72 hours. CBG:  Recent Labs Lab 08/19/16 1149 08/19/16 1620 08/19/16 2121 08/20/16 0846 08/20/16 1157  GLUCAP 129* 112* 123* 105* 110*   Lipid Profile: No results for input(s): CHOL, HDL, LDLCALC, TRIG, CHOLHDL, LDLDIRECT in the last 72 hours. Thyroid Function Tests: No results for input(s): TSH, T4TOTAL, FREET4, T3FREE, THYROIDAB in the last 72 hours. Anemia Panel:  Recent Labs  08/18/16 1456  VITAMINB12 369  FOLATE 10.6   Sepsis Labs:  Recent Labs Lab 08/13/16 1603 08/13/16 1705 08/13/16 2115 08/13/16 2342  LATICACIDVEN 3.2* 4.15* 3.6* 2.5*    Recent Results (from the past 240  hour(s))  Urine culture     Status: Abnormal   Collection Time: 08/13/16  3:08 PM  Result Value Ref Range Status   Specimen Description URINE, RANDOM  Final   Special Requests NONE  Final   Culture <10,000 COLONIES/mL INSIGNIFICANT GROWTH (A)  Final   Report Status 08/14/2016 FINAL  Final  Blood culture (routine x 2)     Status: None   Collection Time: 08/13/16  3:11 PM  Result Value Ref Range Status   Specimen Description BLOOD RIGHT HAND  Final   Special Requests BOTTLES DRAWN AEROBIC AND ANAEROBIC 5CC  Final   Culture NO GROWTH 5 DAYS  Final   Report Status 08/18/2016 FINAL  Final  Blood culture (routine x 2)     Status: None   Collection Time: 08/13/16  6:15 PM  Result Value Ref Range Status   Specimen Description BLOOD LEFT ANTECUBITAL  Final   Special Requests BOTTLES DRAWN AEROBIC AND ANAEROBIC 5CC  Final   Culture NO GROWTH 5 DAYS  Final   Report Status 08/18/2016 FINAL  Final  MRSA PCR Screening     Status: None   Collection Time: 08/14/16  8:01 AM  Result Value Ref Range Status   MRSA by PCR NEGATIVE NEGATIVE Final    Comment:        The GeneXpert MRSA Assay (FDA approved for NASAL specimens only), is one component of a comprehensive MRSA colonization surveillance program. It is not intended to diagnose MRSA infection nor to guide or monitor treatment for MRSA infections.          Radiology Studies: No results found.      Scheduled Meds: . carvedilol  3.125 mg Oral Daily  . cyanocobalamin  1,000 mcg Intramuscular Daily  . enoxaparin (LOVENOX) injection  40 mg Subcutaneous Q24H  . folic acid  1 mg Oral Daily  . furosemide  20 mg Intravenous Once  . furosemide  20 mg Oral Daily  . insulin aspart  0-5 Units Subcutaneous QHS  . insulin aspart  0-9 Units Subcutaneous TID WC  . multivitamin with minerals  1 tablet Oral Daily  . potassium chloride  20 mEq Oral Once  . sodium chloride flush  3 mL Intravenous Q12H  . spironolactone  12.5 mg Oral Daily    . thiamine injection  500 mg Intravenous TID   Continuous Infusions:    LOS: 7 days      Julieta Rogalski, MD [facility] Pager [phone]  If 7PM-7AM, please contact night-coverage [email or web address] Password [facility] 08/20/2016, 12:36 PM

## 2016-08-21 LAB — BASIC METABOLIC PANEL
ANION GAP: 8 (ref 5–15)
BUN: 8 mg/dL (ref 6–20)
CO2: 22 mmol/L (ref 22–32)
Calcium: 8.8 mg/dL — ABNORMAL LOW (ref 8.9–10.3)
Chloride: 110 mmol/L (ref 101–111)
Creatinine, Ser: 0.75 mg/dL (ref 0.61–1.24)
GFR calc Af Amer: >60 mL/min (ref >60)
GLUCOSE: 97 mg/dL (ref 65–99)
Potassium: 3.1 mmol/L — ABNORMAL LOW (ref 3.5–5.1)
Sodium: 140 mmol/L (ref 135–145)

## 2016-08-21 LAB — GLUCOSE, CAPILLARY: Glucose-Capillary: 102 mg/dL — ABNORMAL HIGH (ref 65–99)

## 2016-08-21 MED ORDER — FOLIC ACID 1 MG PO TABS: 1.0000 mg | ORAL_TABLET | Freq: Every day | ORAL | 0 refills | Status: DC

## 2016-08-21 MED ORDER — THIAMINE HCL 100 MG PO TABS: 100.0000 mg | ORAL_TABLET | Freq: Every day | ORAL | 1 refills | Status: DC

## 2016-08-21 MED ORDER — VITAMIN B-1 100 MG PO TABS
100.0000 mg | ORAL_TABLET | Freq: Every day | ORAL | Status: DC
  Administered 2016-08-21: 100 mg via ORAL
  Filled 2016-08-21: qty 1

## 2016-08-21 MED ORDER — ADULT MULTIVITAMIN W/MINERALS CH: 1.0000 | ORAL_TABLET | Freq: Every day | ORAL | 0 refills | Status: AC

## 2016-08-21 MED ORDER — B-12 1000 MCG PO CAPS: 1.0000 | ORAL_CAPSULE | Freq: Every day | ORAL | 0 refills | Status: AC

## 2016-08-21 MED ORDER — GABAPENTIN 600 MG PO TABS: 300.0000 mg | ORAL_TABLET | Freq: Three times a day (TID) | ORAL | 0 refills | Status: DC

## 2016-08-21 NOTE — Progress Notes (Signed)
NURSING PROGRESS NOTE  Justin Bernard [id number] Discharge Data: 08/21/2016 11:34 AM Attending Provider: Albertine Patricia, MD PCP:No PCP Per Patient     Justin Bernard to be D/C'd Home per MD order.  Discussed with the patient the After Visit Summary and all questions fully answered. All IV's discontinued with no bleeding noted. All belongings returned to patient for patient to take home.   Last Vital Signs:  Blood pressure 114/76, pulse 87, temperature 98 F (36.7 C), temperature source Oral, resp. rate 18, height _0  (1.93 m), weight 120.7 kg (266 lb 1.6 oz), SpO2 97 %.  Discharge Medication List Allergies as of 08/21/2016      Reactions   Chlorhexidine Itching   Penicillins Other (See Comments)    childhood reaction, swelling & had to be given an antidote, but reports that he had antibiotic at a later time & it "had some penicillin in it & there was not any allergy reaction       Medication List    TAKE these medications   B-12 1000 MCG Caps Take 1 capsule by mouth daily.   carvedilol 6.25 MG tablet Commonly known as:  COREG TAKE 1 TABLET BY MOUTH TWICE A DAY WITH A MEAL What changed:  See the new instructions.   folic acid 1 MG tablet Commonly known as:  FOLVITE Take 1 tablet (1 mg total) by mouth daily. Start taking on:  08/22/2016   furosemide 40 MG tablet Commonly known as:  LASIX Take 0.5 tablets (20 mg total) by mouth 2 (two) times daily.   gabapentin 600 MG tablet Commonly known as:  NEURONTIN Take 0.5 tablets (300 mg total) by mouth 3 (three) times daily.   glipiZIDE 5 MG 24 hr tablet Commonly known as:  GLUCOTROL XL Take 5 mg by mouth daily with breakfast.   glucose monitoring kit monitoring kit 1 each by Does not apply route 4 (four) times daily - after meals and at bedtime. 1 month Diabetic Testing Supplies for QAC-QHS accuchecks.   HYDROcodone-acetaminophen 7.5-325 MG tablet Commonly known as:  NORCO Take 1-2 tablets by mouth every 6 (six) hours as  needed for moderate pain or severe pain.   hydrocortisone cream 1 % Apply 1 application topically 2 (two) times daily as needed for itching.   methocarbamol 500 MG tablet Commonly known as:  ROBAXIN Take 500 mg by mouth every 6 (six) hours as needed for muscle spasms.   multivitamin with minerals Tabs tablet Take 1 tablet by mouth daily. Start taking on:  08/22/2016   potassium chloride SA 20 MEQ tablet Commonly known as:  KLOR-CON M20 Take 1 tablet (20 mEq total) by mouth 2 (two) times daily.   spironolactone 25 MG tablet Commonly known as:  ALDACTONE Take 1 tablet (25 mg total) by mouth daily.   thiamine 100 MG tablet Take 1 tablet (100 mg total) by mouth daily. Start taking on:  08/22/2016            Durable Medical Equipment        Start     Ordered   08/21/16 1121  For home use only DME Walker rolling  Once    Question:  Patient needs a walker to treat with the following condition  Answer:  Weakness   08/21/16 [name], RN

## 2016-08-21 NOTE — Discharge Instructions (Signed)
Follow with Primary MD  in 7 days   Get CBC, CMP, checked  by Primary MD next visit.    Activity: As tolerated with Full fall precautions use walker/cane & assistance as needed   Disposition Home    Diet: Heart Healthy, low salt , , with feeding assistance and aspiration precautions.  For Heart failure patients - Check your Weight same time everyday, if you gain over 2 pounds, or you develop in leg swelling, experience more shortness of breath or chest pain, call your Primary MD immediately. Follow Cardiac Low Salt Diet and 1.5 lit/day fluid restriction.   On your next visit with your primary care physician please Get Medicines reviewed and adjusted.   Please request your Prim.MD to go over all Hospital Tests and Procedure/Radiological results at the follow up, please get all Hospital records sent to your Prim MD by signing hospital release before you go home.   If you experience worsening of your admission symptoms, develop shortness of breath, life threatening emergency, suicidal or homicidal thoughts you must seek medical attention immediately by calling 911 or calling your MD immediately  if symptoms less severe.  You Must read complete instructions/literature along with all the possible adverse reactions/side effects for all the Medicines you take and that have been prescribed to you. Take any new Medicines after you have completely understood and accpet all the possible adverse reactions/side effects.   Do not drive, operating heavy machinery, perform activities at heights, swimming or participation in water activities or provide baby sitting services if your were admitted for syncope or siezures until you have seen by Primary MD or a Neurologist and advised to do so again.  Do not drive when taking Pain medications.    Do not take more than prescribed Pain, Sleep and Anxiety Medications  Special Instructions: If you have smoked or chewed Tobacco  in the last 2 yrs please stop  smoking, stop any regular Alcohol  and or any Recreational drug use.  Wear Seat belts while driving.   Please note  You were cared for by a hospitalist during your hospital stay. If you have any questions about your discharge medications or the care you received while you were in the hospital after you are discharged, you can call the unit and asked to speak with the hospitalist on call if the hospitalist that took care of you is not available. Once you are discharged, your primary care physician will handle any further medical issues. Please note that NO REFILLS for any discharge medications will be authorized once you are discharged, as it is imperative that you return to your primary care physician (or establish a relationship with a primary care physician if you do not have one) for your aftercare needs so that they can reassess your need for medications and monitor your lab values.

## 2016-08-21 NOTE — Discharge Summary (Signed)
Justin Bernard, is a 56 y.o. male  DOB 08-07-1961  MRN [id number].  Admission date:  08/13/2016  Admitting Physician  Vianne Bulls, MD  Discharge Date:  08/21/2016   Primary MD  No PCP Per Patient  Recommendations for primary care physician for things to follow:  - Please check CBC, BMP during next visit - Please reassess B-12 level and the need to continue B-12 supplement - Continue to counsel about alcohol abstinence - Follow with cardiology as an outpatient regarding EF dropped from 55 to 45 %. - Patient will need referral to urology as an outpatient  Admission Diagnosis  Alcohol withdrawal delirium ([place]) [F10.231]   Discharge Diagnosis  Alcohol withdrawal delirium ([place]) [F10.231]    Principal Problem:   Alcohol withdrawal delirium ([place]) Active Problems:   Hypotension   Chronic systolic CHF (congestive heart failure), NYHA class 2 (HCC)   Hyponatremia   Elevated serum lactate dehydrogenase   Nausea & vomiting   SIRS (systemic inflammatory response syndrome) (HCC)   Diabetes mellitus type II, non insulin dependent ([place])   Alcohol abuse with intoxication with complication ([place])   Alcohol withdrawal delirium, acute, hyperactive ([place])      Past Medical History:  Diagnosis Date  . AICD (automatic cardioverter/defibrillator) present   . Arthritis    L hip   . Asthma    pt. uses albuterol inhaler in the spring for enviromental allergies   . CHF (congestive heart failure) ([place])   . Chronic systolic heart failure (HCC)    NICM, previous heart transplant candidate with EF 10%  . Diabetes mellitus without complication ([place])   . Family history of adverse reaction to anesthesia    sister- headache & N&V  . History of hiatal hernia   . Hypertension   . Kidney stones 1997   passed spontaneously   . Neuromuscular disorder ([place])    essential tremor  . Pneumonia 2013   hosp. - IV antibiotics    . Presence of permanent cardiac pacemaker 2012   [employer]  . Shortness of breath dyspnea     Past Surgical History:  Procedure Laterality Date  . BACK SURGERY  07/2000   fusion , [place]   . BONE EXCISION Right 04/29/2016   Procedure: EXCISION HETEROTOPIC BONE RIGHT HUMERUS;  Surgeon: Altamese Latta, MD;  Location: [facility];  Service: Orthopedics;  Laterality: Right;  . CARDIAC CATHETERIZATION  08/2012  . FRACTURE SURGERY Left    hip , r shoulder   . HARDWARE REMOVAL Right 04/29/2016   Procedure: HARDWARE REMOVAL;  Surgeon: Altamese [place], MD;  Location: [facility];  Service: Orthopedics;  Laterality: Right;  . HERNIA REPAIR Right    inguinal   . HIP SURGERY Left    hardware present, for slipped joint   . ICD    . ORIF HUMERUS FRACTURE Right 07/20/2015   Procedure: OPEN REDUCTION INTERNAL FIXATION (ORIF) PROXIMAL HUMERUS FRACTURE;  Surgeon: Netta Cedars, MD;  Location: [facility];  Service: Orthopedics;  Laterality: Right;  . TONSILLECTOMY    .  ulnar surgery Right        History of present illness and  Hospital Course:     Kindly see H&P for history of present illness and admission details, please review complete Labs, Consult reports and Test reports for all details in brief  HPI  from the history and physical done on the day of admission 08/13/2017  HPI: Justin Bernard is a 56 y.o. male with medical history significant for hypertension, essential tremor, nephrolithiasis, and chronic systolic CHF who presents to the emergency department with tremors, nausea, vomiting, and malaise. Patient reports that he suffers from essential tremor, but this has been much worse over the past couple days, over which time he is also developed nausea with dry heaving. The patient's ex-wife visited him today to discuss something about their children and noted him to look ill, dry heaving, diaphoretic, and shaking. She brought him into the emergency department for further evaluation of this. The  patient initially denied any significant alcohol use, but later endorsed very mild consumption. He gave permission to discuss care with his ex-wife, who reports a strong suspicion for chronic daily heavy drinking for the past couple years at least. Patient denies any recent fevers or chills, denies rhinorrhea or sore throat, denies chest pain or palpitations, and denies dyspnea or cough.  ED Course: Upon arrival to the ED, patient is found to be afebrile, saturating well on room air, tachycardic to 110s, and hypotensive to 66/50. EKG reveals a sinus or possibly ectopic atrial tachycardia with rate 101. Chest x-ray is negative for acute cardiopulmonary disease. Chemistry panels notable for mild hyponatremia and hypochloremia, and mild elevations in serum transaminases. Ethanol level is elevated 293, UDS is negative, and urinalysis is unremarkable. CBC is notable for mild leukocytosis to 12,100 and macrocytosis with MCV of 104.8, but no anemia. Lactic acid is elevated to 3.64 initially, then down to 3.2, before rising again up to 4.5. Blood and urine cultures were obtained in the ED and patient was given 2 l of normal saline. He was treated empirically with vancomycin and cefepime and given multiple doses of IV Ativan for withdrawal symptoms. Patient was experiencing visual hallucinations in the emergency department consistent with withdrawal. Blood pressure responded very well to IV fluids, but patient remains mildly tachycardic. He will be admitted to the stepdown unit for ongoing evaluation and management of suspected complicated alcohol withdrawal.   Hospital Course  Justin Chapmanis a 56 y.o.malewith medical history significant forhypertension, essential tremor, nephrolithiasis, and chronic systolic CHF who presents to the emergency department with tremors, nausea, vomiting, and malaise. Admitted for alcohol abuse with withdrawals   Alcohol abuse with withdrawal, complicated - Pt initially  denies any EtOH use, later endorses 1-2 drinks 3x/wk, but EtOH level is 193, family suspects heavy daily drinking - Treated with you a protocol during hospital stay, no Ativan requirements or evidence of withdrawals over last 72 hours. - Social work consultation requested , given resources for the patient in the community for alcohol abuse, history of he tells me schedule an appointment this coming Tuesday at 8:30 AM. - Patient with ataxia and unsteady gait, CT head with no acute findings, seen by neurology, suspicion for Wernicke's  encephalopathy treated with thiamine 500 mg IV 3 times a day, to continue with thiamine 100 mg oral daily indefinitely, , as well with low B-12 level, started on IM supplement patient with significant macrocytosis, during hospital stay, will continue oral supplements on discharge, needs to be reassessed after appropriately repleted  and may need to lower the dose.  Hypotension, elevated lactate  - Initial BP 66/53, responded well to IVF and has remained stable since , appears secondary to be withdrawals, nausea and vomiting on presentation and decreased oral intake   Nausea, vomiting - CTA abdomen notable for fatty infiltration of liver and nephrolithiasis, but no bowel obstruction, arterial stenosis, dissection, or apparent source of infection - Most likely due to alcohol withdrawal, resolved  Hypokalemia and Hypomagnesemia - repleted, to continue supplements on discharge  Chronic systolic CHF  - Pt reportedly had non-ischemic cardiomyopathy with EF 10% while living in [place], had ICD placed, and was considered for heart transplant  - He has since been evaluated by cardiology in [place] with near-normalization in systolic function  - TTE (07/18/15) with EF 50-55%, mild LVH, no diastolic dysfunction, and no significant valvular disease  - Taking Lasix, Aldactone, and Coreg at home - Repeat 2-D echo this admission with EF 40-45%, which is dropped from his  most recent echo, and able to start on ACEI/ARB given very soft blood pressure, unable to up titrate his beta blockers, will continue with current regimen of Coreg, Lasix and Aldactone  Type II DM  - A1c was 5.0% in September 2017, was 8.2% December 2016 - resumed Glipizide on discharge  Nephrolithiasis  - Pt with hx of nephrolithiasis, has passed stones spontaneously  - CTA from ED notable for bilateral nephrolithiasis, including a 27 x 17 mm stone in left renal pelvis and upper pole resulting in severe dilation of left upper pole  - There is no abd or CVA tenderness, no urinary sxs, UA not c/w infection, and renal function remains normal and at baseline  -  will need non-emergent urology evaluation  Essential tremors - Seen by neurology, started on gabapentin  Discharge Condition:  stable   Follow UP  Follow-up Information    Loralie Champagne, MD Follow up in 2 week(s).   Specialty:  Cardiology Contact information: [address] [place] [address] [phone]             Discharge Instructions  and  Discharge Medications     Discharge Instructions    Discharge instructions    Complete by:  As directed    Follow with Primary MD  in 7 days   Get CBC, CMP, checked  by Primary MD next visit.    Activity: As tolerated with Full fall precautions use walker/cane & assistance as needed   Disposition Home    Diet: Heart Healthy, low salt , , with feeding assistance and aspiration precautions.  For Heart failure patients - Check your Weight same time everyday, if you gain over 2 pounds, or you develop in leg swelling, experience more shortness of breath or chest pain, call your Primary MD immediately. Follow Cardiac Low Salt Diet and 1.5 lit/day fluid restriction.   On your next visit with your primary care physician please Get Medicines reviewed and adjusted.   Please request your Prim.MD to go over all Hospital Tests and  Procedure/Radiological results at the follow up, please get all Hospital records sent to your Prim MD by signing hospital release before you go home.   If you experience worsening of your admission symptoms, develop shortness of breath, life threatening emergency, suicidal or homicidal thoughts you must seek medical attention immediately by calling 911 or calling your MD immediately  if symptoms less severe.  You Must read complete instructions/literature along with all the possible adverse reactions/side  effects for all the Medicines you take and that have been prescribed to you. Take any new Medicines after you have completely understood and accpet all the possible adverse reactions/side effects.   Do not drive, operating heavy machinery, perform activities at heights, swimming or participation in water activities or provide baby sitting services if your were admitted for syncope or siezures until you have seen by Primary MD or a Neurologist and advised to do so again.  Do not drive when taking Pain medications.    Do not take more than prescribed Pain, Sleep and Anxiety Medications  Special Instructions: If you have smoked or chewed Tobacco  in the last 2 yrs please stop smoking, stop any regular Alcohol  and or any Recreational drug use.  Wear Seat belts while driving.   Please note  You were cared for by a hospitalist during your hospital stay. If you have any questions about your discharge medications or the care you received while you were in the hospital after you are discharged, you can call the unit and asked to speak with the hospitalist on call if the hospitalist that took care of you is not available. Once you are discharged, your primary care physician will handle any further medical issues. Please note that NO REFILLS for any discharge medications will be authorized once you are discharged, as it is imperative that you return to your primary care physician (or establish a  relationship with a primary care physician if you do not have one) for your aftercare needs so that they can reassess your need for medications and monitor your lab values.   Increase activity slowly    Complete by:  As directed      Allergies as of 08/21/2016      Reactions   Chlorhexidine Itching   Penicillins Other (See Comments)    childhood reaction, swelling & had to be given an antidote, but reports that he had antibiotic at a later time & it "had some penicillin in it & there was not any allergy reaction       Medication List    TAKE these medications   B-12 1000 MCG Caps Take 1 capsule by mouth daily.   carvedilol 6.25 MG tablet Commonly known as:  COREG TAKE 1 TABLET BY MOUTH TWICE A DAY WITH A MEAL What changed:  See the new instructions.   folic acid 1 MG tablet Commonly known as:  FOLVITE Take 1 tablet (1 mg total) by mouth daily. Start taking on:  08/22/2016   furosemide 40 MG tablet Commonly known as:  LASIX Take 0.5 tablets (20 mg total) by mouth 2 (two) times daily.   gabapentin 600 MG tablet Commonly known as:  NEURONTIN Take 0.5 tablets (300 mg total) by mouth 3 (three) times daily.   glipiZIDE 5 MG 24 hr tablet Commonly known as:  GLUCOTROL XL Take 5 mg by mouth daily with breakfast.   glucose monitoring kit monitoring kit 1 each by Does not apply route 4 (four) times daily - after meals and at bedtime. 1 month Diabetic Testing Supplies for QAC-QHS accuchecks.   HYDROcodone-acetaminophen 7.5-325 MG tablet Commonly known as:  NORCO Take 1-2 tablets by mouth every 6 (six) hours as needed for moderate pain or severe pain.   hydrocortisone cream 1 % Apply 1 application topically 2 (two) times daily as needed for itching.   methocarbamol 500 MG tablet Commonly known as:  ROBAXIN Take 500 mg by mouth every 6 (six) hours as  needed for muscle spasms.   multivitamin with minerals Tabs tablet Take 1 tablet by mouth daily. Start taking on:  08/22/2016     potassium chloride SA 20 MEQ tablet Commonly known as:  KLOR-CON M20 Take 1 tablet (20 mEq total) by mouth 2 (two) times daily.   spironolactone 25 MG tablet Commonly known as:  ALDACTONE Take 1 tablet (25 mg total) by mouth daily.   thiamine 100 MG tablet Take 1 tablet (100 mg total) by mouth daily. Start taking on:  08/22/2016            Durable Medical Equipment        Start     Ordered   08/21/16 1121  For home use only DME Walker rolling  Once    Question:  Patient needs a walker to treat with the following condition  Answer:  Weakness   08/21/16 1121        Diet and Activity recommendation: See Discharge Instructions above   Consults obtained -  Neurolgoy   Major procedures and Radiology Reports - PLEASE review detailed and final reports for all details, in brief -     Dg Chest 2 View  Result Date: 08/13/2016 CLINICAL DATA:  Hypotension, lightheadedness x1 day. Pt denies SOB and CP. Hx of HTN, DM, CHF, asthma, s/p AICD, PNA. Nonsmoker. EXAM: CHEST  2 VIEW COMPARISON:  12/08/2015 FINDINGS: Left-sided transvenous pacemaker lead to the right ventricle. Heart size is normal. The lungs are free of focal consolidations and pleural effusions. No pulmonary edema. Remote rib fractures. IMPRESSION: No evidence for acute  abnormality. Electronically Signed   By: Nolon Nations M.D.   On: 08/13/2016 16:52   Ct Head Wo Contrast  Result Date: 08/18/2016 CLINICAL DATA:  Tremors for 2 months EXAM: CT HEAD WITHOUT CONTRAST TECHNIQUE: Contiguous axial images were obtained from the base of the skull through the vertex without intravenous contrast. COMPARISON:  None. FINDINGS: Brain: There is slight diffuse atrophy for age. Prominence of the cisterna magna is an anatomic variant. There is no intracranial mass, hemorrhage, extra-axial fluid collection, or midline shift. Gray-white compartments appear normal. No acute infarct evident. Vascular: There is no hyperdense vessel. There is  no appreciable vascular calcification. Skull: The bony calvarium appears intact. There is calcification in the scalp in the right parietal region. Sinuses/Orbits: There is mild mucosal thickening in several anterior ethmoid air cells. Visualized paranasal sinuses elsewhere are clear. Orbits appear symmetric bilaterally. Other: Visualized mastoid air cells are clear. IMPRESSION: Mild atrophy for age. No intracranial mass, hemorrhage, or extra-axial fluid collection. Gray-white compartments appear normal. Mucosal thickening noted in several anterior ethmoid air cells. Calcification in the right parietal scalp region may represent residua of old trauma. No bony abnormality in this area. Electronically Signed   By: Lowella Grip III M.D.   On: 08/18/2016 09:52   Ct Angio Abd/pel W And/or Wo Contrast  Result Date: 08/13/2016 CLINICAL DATA:  Abdominal pain. EXAM: CTA ABDOMEN AND PELVIS wITHOUT AND WITH CONTRAST TECHNIQUE: Multidetector CT imaging of the abdomen and pelvis was performed using the standard protocol during bolus administration of intravenous contrast. Multiplanar reconstructed images and MIPs were obtained and reviewed to evaluate the vascular anatomy. CONTRAST:  100 mL of Isovue 370 intravenously. COMPARISON:  CT scan of December 04, 2005. FINDINGS: VASCULAR Aorta: Normal caliber aorta without aneurysm, dissection, vasculitis or significant stenosis. Celiac: Patent without evidence of aneurysm, dissection, vasculitis or significant stenosis. SMA: Patent without evidence of aneurysm, dissection, vasculitis or significant  stenosis. Renals: Both renal arteries are patent without evidence of aneurysm, dissection, vasculitis, fibromuscular dysplasia or significant stenosis. IMA: Patent without evidence of aneurysm, dissection, vasculitis or significant stenosis. Inflow: Patent without evidence of aneurysm, dissection, vasculitis or significant stenosis. Proximal Outflow: Bilateral common femoral and  visualized portions of the superficial and profunda femoral arteries are patent without evidence of aneurysm, dissection, vasculitis or significant stenosis. Veins: No obvious venous abnormality within the limitations of this arterial phase study. Review of the MIP images confirms the above findings. NON-VASCULAR Lower chest: No acute abnormality. Hepatobiliary: Fatty infiltration of the liver is noted. No gallstones are noted. Pancreas: Unremarkable. No pancreatic ductal dilatation or surrounding inflammatory changes. Spleen: Normal in size without focal abnormality. Adrenals/Urinary Tract: Adrenal glands appear normal. Bilateral nephrolithiasis is noted. Largest calculus measures 27 x 17 mm in left renal pelvis and upper infundibulum. This results in obstruction of the left upper pole collecting system. No ureteral calculus is noted. Stomach/Bowel: Stomach is within normal limits. Appendix appears normal. No evidence of bowel wall thickening, distention, or inflammatory changes. Lymphatic: No significant adenopathy is noted. Reproductive: Prostatic calcifications are noted. Other: No abdominal wall hernia or abnormality. No abdominopelvic ascites. Musculoskeletal: No acute or significant osseous findings. IMPRESSION: VASCULAR No evidence of significant mesenteric or renal artery stenosis. No evidence of abdominal aortic aneurysm or dissection. NON-VASCULAR Fatty infiltration the liver. Bilateral nephrolithiasis is noted. 27 x 17 mm calculus is noted in left renal pelvis and upper pole infundibulum, resulting in severe dilatation of left upper pole collecting system. Electronically Signed   By: Marijo Conception, M.D.   On: 08/13/2016 19:04    Micro Results    Recent Results (from the past 240 hour(s))  Urine culture     Status: Abnormal   Collection Time: 08/13/16  3:08 PM  Result Value Ref Range Status   Specimen Description URINE, RANDOM  Final   Special Requests NONE  Final   Culture <10,000  COLONIES/mL INSIGNIFICANT GROWTH (A)  Final   Report Status 08/14/2016 FINAL  Final  Blood culture (routine x 2)     Status: None   Collection Time: 08/13/16  3:11 PM  Result Value Ref Range Status   Specimen Description BLOOD RIGHT HAND  Final   Special Requests BOTTLES DRAWN AEROBIC AND ANAEROBIC 5CC  Final   Culture NO GROWTH 5 DAYS  Final   Report Status 08/18/2016 FINAL  Final  Blood culture (routine x 2)     Status: None   Collection Time: 08/13/16  6:15 PM  Result Value Ref Range Status   Specimen Description BLOOD LEFT ANTECUBITAL  Final   Special Requests BOTTLES DRAWN AEROBIC AND ANAEROBIC 5CC  Final   Culture NO GROWTH 5 DAYS  Final   Report Status 08/18/2016 FINAL  Final  MRSA PCR Screening     Status: None   Collection Time: 08/14/16  8:01 AM  Result Value Ref Range Status   MRSA by PCR NEGATIVE NEGATIVE Final    Comment:        The GeneXpert MRSA Assay (FDA approved for NASAL specimens only), is one component of a comprehensive MRSA colonization surveillance program. It is not intended to diagnose MRSA infection nor to guide or monitor treatment for MRSA infections.        Today   Subjective:   Justin Bernard today has no headache,no chest abdominal pain,no new weakness tingling or numbness, feels much better wants to go home today.   Objective:   Blood  pressure 114/76, pulse 87, temperature 98 F (36.7 C), temperature source Oral, resp. rate 18, height _0  (1.93 m), weight 120.7 kg (266 lb 1.6 oz), SpO2 97 %.   Intake/Output Summary (Last 24 hours) at 08/21/16 1124 Last data filed at 08/20/16 1702  Gross per 24 hour  Intake                0 ml  Output              700 ml  Net             -700 ml    Exam Awake Alert, Oriented x 3, No new F.N deficits, Normal affect [place].AT,PERRAL Supple Neck,No JVD, No cervical lymphadenopathy appriciated.  Symmetrical Chest wall movement, Good air movement bilaterally, CTAB RRR,No Gallops,Rubs or new  Murmurs, No Parasternal Heave +ve B.Sounds, Abd Soft, Non tender, No rebound -guarding or rigidity. No Cyanosis, Clubbing , +1edema,   Data Review   CBC w Diff: Lab Results  Component Value Date   WBC 7.8 08/19/2016   HGB 11.9 (L) 08/19/2016   HCT 34.8 (L) 08/19/2016   PLT 299 08/19/2016   LYMPHOPCT 16 08/17/2016   MONOPCT 11 08/17/2016   EOSPCT 12 08/17/2016   BASOPCT 1 08/17/2016    CMP: Lab Results  Component Value Date   NA 140 08/21/2016   K 3.1 (L) 08/21/2016   CL 110 08/21/2016   CO2 22 08/21/2016   BUN 8 08/21/2016   CREATININE 0.75 08/21/2016   PROT 5.5 (L) 08/17/2016   ALBUMIN 2.7 (L) 08/17/2016   BILITOT 1.3 (H) 08/17/2016   ALKPHOS 115 08/17/2016   AST 115 (H) 08/17/2016   ALT 51 08/17/2016  .   Total Time in preparing paper work, data evaluation and todays exam - 35 minutes  Aniayah Alaniz M.D on 08/21/2016 at 11:24 AM  [facility]   Office  [phone]

## 2016-08-23 ENCOUNTER — Encounter (HOSPITAL_COMMUNITY): Payer: Self-pay | Admitting: Psychology

## 2016-08-23 ENCOUNTER — Ambulatory Visit (HOSPITAL_COMMUNITY): Payer: Managed Care, Other (non HMO) | Admitting: Psychology

## 2016-08-23 NOTE — Progress Notes (Signed)
Justin Bernard is a 56 y.o. male patient. CD-IOP: The patient appeared today for a CCA and orientation to the CD-IOP. After speaking with him about his alcohol use and his life, it does not appear that he has any sort of alcohol problem that would warrant treatment at this time. He was recently hospitalized and was detoxed, but it appears that he actually experienced the 'perfect storm' of events that culminated in his deterioration and subsequent hospital stay. The patient's CHF  Medications in conjunction with lasix and lack of sleep coupled with stress, little food and a few drinks brought him down. In reviewing the criteria, the patient did not meet, but one of those, which is that he shouldn't be drinking at all due to the fact that he has diabetes and CHF. He agreed to phone me in one week to report on his condition and how he is feeling and to verify whether he has had any alcohol. He is struggling with a tremendous amount of stress from his job and responsibilities at home with his children and his self-care has not been the best. The patient appeared very open about his life and circumstances and I suspect that this hospitalization was due to the stress and circumstances of his employment, other health conditions and medications and perhaps one incident of alcohol abuse. We will wait to hear from him next week.         Charmian Muff, LCAS

## 2016-08-24 ENCOUNTER — Other Ambulatory Visit (HOSPITAL_COMMUNITY): Payer: Managed Care, Other (non HMO)

## 2016-08-25 ENCOUNTER — Other Ambulatory Visit (HOSPITAL_COMMUNITY): Payer: Managed Care, Other (non HMO)

## 2016-08-29 ENCOUNTER — Other Ambulatory Visit (HOSPITAL_COMMUNITY): Payer: Managed Care, Other (non HMO)

## 2016-08-31 ENCOUNTER — Other Ambulatory Visit (HOSPITAL_COMMUNITY): Payer: Managed Care, Other (non HMO)

## 2016-09-01 ENCOUNTER — Other Ambulatory Visit (HOSPITAL_COMMUNITY): Payer: Managed Care, Other (non HMO)

## 2016-09-05 ENCOUNTER — Other Ambulatory Visit (HOSPITAL_COMMUNITY): Payer: Managed Care, Other (non HMO)

## 2016-09-07 ENCOUNTER — Other Ambulatory Visit (HOSPITAL_COMMUNITY): Payer: Managed Care, Other (non HMO)

## 2016-09-07 ENCOUNTER — Telehealth (HOSPITAL_COMMUNITY): Payer: Self-pay

## 2016-09-07 NOTE — Telephone Encounter (Signed)
Patient called to report orthostatic s/s. Dizziness with standing and walking, checked BP at home sitting and standing and reports 20-30 point drop in SBP.  States this has been a month long issue without resolve. Also reports BLEE and feels lasix is not working well. Denies SOB, chest pain. Has already taken all afternoon medications including coreg and lasix. Patient scheduled to be seen in clinic tomorrow afternoon. Advised to call CHF clinic if s/s worsen, or to seek emergency medical treatment if afterhours. Aware and agreeable to plan as stated above.  Ave Filter, RN

## 2016-09-08 ENCOUNTER — Other Ambulatory Visit (HOSPITAL_COMMUNITY): Payer: Self-pay | Admitting: Cardiology

## 2016-09-08 ENCOUNTER — Ambulatory Visit (HOSPITAL_COMMUNITY)
Admission: RE | Admit: 2016-09-08 | Discharge: 2016-09-08 | Disposition: A | Discharge status: Home / Self Care | Payer: Managed Care, Other (non HMO) | Source: Ambulatory Visit | Attending: Cardiology | Admitting: Cardiology

## 2016-09-08 ENCOUNTER — Other Ambulatory Visit (HOSPITAL_COMMUNITY): Payer: Managed Care, Other (non HMO)

## 2016-09-08 ENCOUNTER — Other Ambulatory Visit (HOSPITAL_COMMUNITY): Payer: Self-pay | Admitting: *Deleted

## 2016-09-08 VITALS — BP 96/54 | HR 77 | Wt 267.8 lb

## 2016-09-08 DIAGNOSIS — I951 Orthostatic hypotension: Secondary | ICD-10-CM | POA: Diagnosis not present

## 2016-09-08 DIAGNOSIS — Z79899 Other long term (current) drug therapy: Secondary | ICD-10-CM | POA: Diagnosis not present

## 2016-09-08 DIAGNOSIS — I872 Venous insufficiency (chronic) (peripheral): Secondary | ICD-10-CM

## 2016-09-08 DIAGNOSIS — I429 Cardiomyopathy, unspecified: Secondary | ICD-10-CM | POA: Diagnosis present

## 2016-09-08 DIAGNOSIS — F1010 Alcohol abuse, uncomplicated: Secondary | ICD-10-CM | POA: Diagnosis not present

## 2016-09-08 DIAGNOSIS — I5032 Chronic diastolic (congestive) heart failure: Secondary | ICD-10-CM | POA: Diagnosis present

## 2016-09-08 DIAGNOSIS — F10931 Alcohol use, unspecified with withdrawal delirium: Secondary | ICD-10-CM

## 2016-09-08 DIAGNOSIS — F10231 Alcohol dependence with withdrawal delirium: Secondary | ICD-10-CM | POA: Diagnosis not present

## 2016-09-08 DIAGNOSIS — I959 Hypotension, unspecified: Secondary | ICD-10-CM | POA: Diagnosis not present

## 2016-09-08 DIAGNOSIS — E119 Type 2 diabetes mellitus without complications: Secondary | ICD-10-CM | POA: Diagnosis not present

## 2016-09-08 DIAGNOSIS — I878 Other specified disorders of veins: Secondary | ICD-10-CM | POA: Diagnosis not present

## 2016-09-08 DIAGNOSIS — W19XXXD Unspecified fall, subsequent encounter: Secondary | ICD-10-CM | POA: Insufficient documentation

## 2016-09-08 DIAGNOSIS — I5022 Chronic systolic (congestive) heart failure: Secondary | ICD-10-CM

## 2016-09-08 DIAGNOSIS — S42391D Other fracture of shaft of right humerus, subsequent encounter for fracture with routine healing: Secondary | ICD-10-CM | POA: Insufficient documentation

## 2016-09-08 LAB — BRAIN NATRIURETIC PEPTIDE: B Natriuretic Peptide: 236.4 pg/mL — ABNORMAL HIGH (ref 0.0–100.0)

## 2016-09-08 MED ORDER — CARVEDILOL 3.125 MG PO TABS: 3.1250 mg | ORAL_TABLET | Freq: Two times a day (BID) | ORAL | 3 refills | Status: DC

## 2016-09-08 MED ORDER — SPIRONOLACTONE 25 MG PO TABS: 12.5000 mg | ORAL_TABLET | Freq: Every day | ORAL | 3 refills | Status: DC

## 2016-09-08 MED ORDER — FUROSEMIDE 40 MG PO TABS: 40.0000 mg | ORAL_TABLET | Freq: Every day | ORAL | 6 refills | Status: DC

## 2016-09-08 MED ORDER — CARVEDILOL 6.25 MG PO TABS: ORAL_TABLET | ORAL | 1 refills | Status: DC

## 2016-09-08 NOTE — Patient Instructions (Signed)
Routine lab work today. Will notify you of abnormal results, otherwise no news is good news!  HOLD Lasix and Spironolactone for two days.  Resume Lasix at LOWER dose on Sunday at 40 mg tablet once daily.  Resume Spironolactone at LOWER dose on Sunday at 12.5 mg (1/2 tablet) once daily.  DECREASE Carvedilol (Coreg) to 3.125 mg twice daily. Can half the current 6.25 mg tablets that you have (Take 1/2 tablet twice daily). New Rx for 3.125 mg tablets has been sent to your pharmacy (Take 1 tablet twice daily).  Wear compression stockings. Remove for bathing.  Follow up 2 weeks with Otilio Saber PA-C.  Do the following things EVERYDAY: 1) Weigh yourself in the morning before breakfast. Write it down and keep it in a log. 2) Take your medicines as prescribed 3) Eat low salt foods-Limit salt (sodium) to 2000 mg per day.  4) Stay as active as you can everyday 5) Limit all fluids for the day to less than 2 liters

## 2016-09-08 NOTE — Progress Notes (Signed)
Advanced Heart Failure Clinic Note   Patient ID: Justin Bernard, male   DOB: 08/28/1960, 55 y.o.   MRN: [id number]  PCP: Courtney Wharton, PA-C with [facility]: Dr. McLean  56 yo with history of nonischemic cardiomyopathy and recent fall with humeral fracture.    CHF was first noted in 07/2010 per patient when he developed dyspnea. He was seen at [facility]. Of note, mother had CHF diagnosis around 50 and grandfather had CHF diagnosis around 50. Pt's disease thought to be NICM with clean cath as below. 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  Initially, LVAD and transplant were considered, but EF improved greatly on medical management as echoes below demonstrate. He is s/p [employer] ICD implant 08/2010  He began to feel lightheaded with standing just prior to Thanksgiving. BPs were low at home over the same time period so he did cut his Coreg in half to 12.5 mg BID.  Despite this, he remained lightheaded with standing and fell, possibly with orthostatic syncope, fracturing his right humerus.  This was surgically repaired in 12/16.  Post-op, he developed volume overload and had to be diuresed. He was sent home on Coreg 6.25 mg bid, spironolactone 25 mg daily, and Lasix 40 mg daily. Echo that admission showed EF 50-55% .   He returns today for post hospital follow up. States BP hasn't gotten any better since he got out of the hospital earlier this month. States he can feel his BP drop when he stands up.   + Orthostatic with standing. No near syncope, but severely lightheaded. Over past few days has been hallucinating with Audio/Visual component. Still working at [employer] and hates it.  Says it is driving him crazy.   CMET 09/08/16 K 3.3, Creatinine 1.78, BUN 28.  (Baseline is 0.6 - 0.8)  Labs (12/16): K 3.5, creatinine 0.82, HCT 0.82 Labs (3/17): K 3.7, creatinine 0.67  SH:  He lives with his 2 nephews and son in [place].  He teaches at a university.   FH: Mother with CHF around 50, grandfather with CHF around 50.   ROS: All systems reviewed and negative except as per HPI.   PMH:  1. Chronic systolic CHF:  Nonischemic cardiomyopathy, initially followed at [facility]. [employer] ICD.  - Cardiolite 08/13/10 [place]: areas or reversibility in anterior wall and lesser extent inferior wall. Dilated LV with EF 15% and severe hypokinesis. - LHC 08/31/10 [place]: Right dominant. Left main free of disease, branches into LAD and cicrumflex. Large caliber vessels with no significant stenosis. No intimal irregularities in RCA. Elevated LVEDP at 40 - ECHO 08/12/2010 [place]: EF <24% - ECHO 09/27/2010 [place], [place]: EF 15% with moderate RVE with marked dysfunction, moderate MR/TR, trace AR, mild PR, moderate LV dilation, moderate RV dilation, Severe LAE, mild RAE.  - ECHO 01/08/2012 [place]: EF <25%, Grade 1 DD, MR/TR significantly improved. - ECHO 09/21/2011 [place]: EF 45-55%, Grade 1 DD, RV mildly dilated and reduced, Trace MR/PR/TR, no AR. - ECHO 03/21/2012 [place]: EF 45%, Grade 1 DD, Trace MR,PR,TR.  - ECHO 12/16 EF 50-55%, mild LVH.  - Hypotensive with low dose lisinopril. 2. Type II diabetes 3. Right humeral fracture (12/16).  4. Orthostatic hypotension.   Current Outpatient Prescriptions  Medication Sig Dispense Refill  . carvedilol (COREG) 6.25 MG tablet TAKE 1 TABLET BY MOUTH TWICE A DAY WITH A MEAL 60 tablet 1  .   Cyanocobalamin (B-12) 1000 MCG CAPS Take 1 capsule by mouth daily. 30 capsule 0  . folic acid (FOLVITE) 1 MG tablet Take 1 tablet (1 mg total) by mouth daily. 30 tablet 0  . furosemide (LASIX) 40 MG tablet Take 80-120 mg by mouth. 2-3 tabs daily as needed for decreased UOP/BLEE    . gabapentin (NEURONTIN) 600 MG tablet Take 0.5 tablets (300 mg total) by mouth 3 (three) times daily. 45 tablet 0  . glipiZIDE  (GLUCOTROL XL) 5 MG 24 hr tablet Take 5 mg by mouth daily with breakfast.     . glucose monitoring kit (FREESTYLE) monitoring kit 1 each by Does not apply route 4 (four) times daily - after meals and at bedtime. 1 month Diabetic Testing Supplies for QAC-QHS accuchecks. 1 each 1  . hydrocortisone cream 1 % Apply 1 application topically 2 (two) times daily as needed for itching.    . Multiple Vitamin (MULTIVITAMIN WITH MINERALS) TABS tablet Take 1 tablet by mouth daily. 30 tablet 0  . potassium chloride SA (KLOR-CON M20) 20 MEQ tablet Take 1 tablet (20 mEq total) by mouth 2 (two) times daily. 60 tablet 6  . spironolactone (ALDACTONE) 25 MG tablet Take 1 tablet (25 mg total) by mouth daily. 90 tablet 3  . thiamine 100 MG tablet Take 1 tablet (100 mg total) by mouth daily. 90 tablet 1  . CVS VITAMIN B12 1000 MCG tablet Take 1,000 mcg by mouth daily.  0  . traMADol (ULTRAM) 50 MG tablet      No current facility-administered medications for this encounter.    BP (!) 96/54 (BP Location: Left Arm, Patient Position: Sitting, Cuff Size: Large)   Pulse 77   Wt 267 lb 12.8 oz (121.5 kg)   SpO2 94%   BMI 32.60 kg/m    Orthostatics 72/48 standing  Wt Readings from Last 3 Encounters:  09/08/16 267 lb 12.8 oz (121.5 kg)  08/21/16 266 lb 1.6 oz (120.7 kg)  04/29/16 258 lb (117 kg)     General: NAD. Seated in WC.  Neck: JVP 6-7 cm. No thyromegaly or thyroid nodule. Ambulated in the clinic with a cane.  Lungs: CTAB, normal effort CV: Nondisplaced PMI.  Heart regular S1/S2, no S3/S4, no murmur. No carotid bruit.  Normal pedal pulses.  Abdomen: Soft, NT, ND, no HSM. No bruits or masses. +BS  Skin: Intact without lesions or rashes.  Neurologic: Alert and oriented x 3.  Psych: Normal affect. Extremities: No clubbing or cyanosis. 2+ peripheral edema 1/2 way to knee.  HEENT: Normal.   Assessment/Plan: 1. Chronic diastolic CHF: Low EF in past with nonischemic cardiomyopathy.  EF in December 2016 50-55%.   NYHA class II symptoms.  - Volume status OK centrally, but with 2+ peripheral edema.    - Hold lasix x 2 days with orthostasis. Resume Sunday at 40 mg daily.  - Decrease coreg to 3.25 mg BID - Hold spiro. Resume Sunday at 12.5 mg daily.    - Check BNP today. CMET drawn earlier at PCP.  2. Orthostatic hypotension:  Meds as above.  - Pt may require midodrine in order to diuresis. Have asked him to call us back early next week to see how is doing.  3. ETOH abuse - Will check serum ETOH. Family suspects pt may still be using even though he states he has not had any since PTA. - Pt sees Psychologist once weekly.  4. Chronic venous stasis - Suspect some degree of his peripheral edema  is secondary to chronic venous stasis - Have recommended use of compressions stockings for edema and for orthostasis.  Follow up in 2 weeks. Sooner with symptoms. Knows to report to ED if acutely worsens with worsening dizziness, SOB, or syncope/near-syncope.   Andrew , PA-C  09/08/2016  Total time spent >25 minutes. Over half that spent discussing the above.   

## 2016-09-09 ENCOUNTER — Encounter (HOSPITAL_COMMUNITY): Payer: Self-pay | Admitting: Emergency Medicine

## 2016-09-09 ENCOUNTER — Emergency Department (HOSPITAL_COMMUNITY): Payer: Managed Care, Other (non HMO)

## 2016-09-09 ENCOUNTER — Observation Stay (HOSPITAL_COMMUNITY)
Admission: EM | Admit: 2016-09-09 | Discharge: 2016-09-10 | Disposition: A | Discharge status: Home / Self Care | Payer: Managed Care, Other (non HMO) | Attending: Internal Medicine | Admitting: Internal Medicine

## 2016-09-09 DIAGNOSIS — I951 Orthostatic hypotension: Principal | ICD-10-CM | POA: Diagnosis present

## 2016-09-09 DIAGNOSIS — N200 Calculus of kidney: Secondary | ICD-10-CM | POA: Diagnosis not present

## 2016-09-09 DIAGNOSIS — F17290 Nicotine dependence, other tobacco product, uncomplicated: Secondary | ICD-10-CM | POA: Diagnosis not present

## 2016-09-09 DIAGNOSIS — R339 Retention of urine, unspecified: Secondary | ICD-10-CM | POA: Diagnosis not present

## 2016-09-09 DIAGNOSIS — Z79899 Other long term (current) drug therapy: Secondary | ICD-10-CM | POA: Diagnosis not present

## 2016-09-09 DIAGNOSIS — R609 Edema, unspecified: Secondary | ICD-10-CM

## 2016-09-09 DIAGNOSIS — E876 Hypokalemia: Secondary | ICD-10-CM | POA: Diagnosis present

## 2016-09-09 DIAGNOSIS — I5022 Chronic systolic (congestive) heart failure: Secondary | ICD-10-CM | POA: Diagnosis present

## 2016-09-09 DIAGNOSIS — N179 Acute kidney failure, unspecified: Secondary | ICD-10-CM | POA: Insufficient documentation

## 2016-09-09 DIAGNOSIS — G8929 Other chronic pain: Secondary | ICD-10-CM | POA: Diagnosis not present

## 2016-09-09 DIAGNOSIS — I110 Hypertensive heart disease with heart failure: Secondary | ICD-10-CM | POA: Insufficient documentation

## 2016-09-09 DIAGNOSIS — Z95810 Presence of automatic (implantable) cardiac defibrillator: Secondary | ICD-10-CM | POA: Diagnosis not present

## 2016-09-09 DIAGNOSIS — M7989 Other specified soft tissue disorders: Secondary | ICD-10-CM | POA: Diagnosis present

## 2016-09-09 DIAGNOSIS — Z7984 Long term (current) use of oral hypoglycemic drugs: Secondary | ICD-10-CM | POA: Diagnosis not present

## 2016-09-09 DIAGNOSIS — E1140 Type 2 diabetes mellitus with diabetic neuropathy, unspecified: Secondary | ICD-10-CM | POA: Diagnosis not present

## 2016-09-09 LAB — CBC
HCT: 35.8 % — ABNORMAL LOW (ref 39.0–52.0)
HEMOGLOBIN: 12.1 g/dL — AB (ref 13.0–17.0)
MCH: 34.5 pg — ABNORMAL HIGH (ref 26.0–34.0)
MCHC: 33.8 g/dL (ref 30.0–36.0)
MCV: 102 fL — ABNORMAL HIGH (ref 78.0–100.0)
PLATELETS: 413 10*3/uL — AB (ref 150–400)
RBC: 3.51 MIL/uL — AB (ref 4.22–5.81)
RDW: 14.7 % (ref 11.5–15.5)
WBC: 11.6 10*3/uL — AB (ref 4.0–10.5)

## 2016-09-09 LAB — BASIC METABOLIC PANEL
Anion gap: 10 (ref 5–15)
BUN: 21 mg/dL — ABNORMAL HIGH (ref 6–20)
CHLORIDE: 105 mmol/L (ref 101–111)
CO2: 21 mmol/L — ABNORMAL LOW (ref 22–32)
Calcium: 8.9 mg/dL (ref 8.9–10.3)
Creatinine, Ser: 1.32 mg/dL — ABNORMAL HIGH (ref 0.61–1.24)
GFR, EST NON AFRICAN AMERICAN: 60 mL/min — AB (ref >60)
Glucose, Bld: 150 mg/dL — ABNORMAL HIGH (ref 65–99)
POTASSIUM: 2.9 mmol/L — AB (ref 3.5–5.1)
SODIUM: 136 mmol/L (ref 135–145)

## 2016-09-09 LAB — HEPATIC FUNCTION PANEL
ALBUMIN: 3 g/dL — AB (ref 3.5–5.0)
ALT: 28 U/L (ref 17–63)
AST: 47 U/L — AB (ref 15–41)
Alkaline Phosphatase: 64 U/L (ref 38–126)
BILIRUBIN DIRECT: 0.2 mg/dL (ref 0.1–0.5)
Indirect Bilirubin: 0.4 mg/dL (ref 0.3–0.9)
Total Bilirubin: 0.6 mg/dL (ref 0.3–1.2)
Total Protein: 6.2 g/dL — ABNORMAL LOW (ref 6.5–8.1)

## 2016-09-09 LAB — URINALYSIS, ROUTINE W REFLEX MICROSCOPIC
BILIRUBIN URINE: NEGATIVE
Glucose, UA: NEGATIVE mg/dL
KETONES UR: NEGATIVE mg/dL
Nitrite: NEGATIVE
PROTEIN: NEGATIVE mg/dL
Specific Gravity, Urine: 1.009 (ref 1.005–1.030)
pH: 6 (ref 5.0–8.0)

## 2016-09-09 LAB — ETHANOL

## 2016-09-09 LAB — I-STAT CG4 LACTIC ACID, ED: Lactic Acid, Venous: 1.37 mmol/L (ref 0.5–1.9)

## 2016-09-09 MED ORDER — SODIUM CHLORIDE 0.9 % IV BOLUS (SEPSIS)
500.0000 mL | Freq: Once | INTRAVENOUS | Status: AC
  Administered 2016-09-09: 500 mL via INTRAVENOUS

## 2016-09-09 MED ORDER — POTASSIUM CHLORIDE CRYS ER 20 MEQ PO TBCR
40.0000 meq | EXTENDED_RELEASE_TABLET | Freq: Once | ORAL | Status: AC
  Administered 2016-09-09: 40 meq via ORAL
  Filled 2016-09-09: qty 2

## 2016-09-09 MED ORDER — CIPROFLOXACIN HCL 500 MG PO TABS
500.0000 mg | ORAL_TABLET | Freq: Two times a day (BID) | ORAL | Status: DC
Start: 2016-09-09 — End: 2016-09-10
  Administered 2016-09-10 (×2): 500 mg via ORAL
  Filled 2016-09-09 (×2): qty 1

## 2016-09-09 NOTE — ED Provider Notes (Signed)
[facility] DEPT Provider Note   CSN: [id number] Arrival date & time: 09/09/16  1600     History   Chief Complaint Chief Complaint  Patient presents with  . Abnormal Lab    HPI Justin Bernard is a 56 y.o. male.  HPI Justin Bernard is a 56 y.o. male with history of CHF, heart failure, diabetes, hypertension, alcohol abuse, presents to emergency department complaining of bilateral extremity swelling, inability to urinate, left flank pain. Patient states he was in the hospital for "abnormal electrolytes" and according to the chart alcohol withdrawal 3 weeks ago. While in hospital, patient had CT abdomen and pelvis done was diagnosed with a large inferior renal stone and left hydronephrosis. Since discharge patient reports intermittent left flank pains that he states "drop him to the floor." He states pain got worse in the last 4 days. He also reports difficulty urinating in the last 4 days. He was seen for medication checks by his cardiologist yesterday and will have blood work drawn which resulted today showed elevated creatinine level and he was sent here for further evaluation. Patient states that his leg swelled overall in the hospital, because he was given fluids. He reports that the leg swelling did go down with Lasix after his admission, but worsened again in the last 4 days. Yesterday when he was seen in cardiology office he mentioned that he has been feeling dizzy, that time his blood pressure was 90 systolic and his Lasix and his blood pressure medications were cut down. She states she has not taken it for pain at this time. He denies any other complaints. No chest pain or SOB  Past Medical History:  Diagnosis Date  . AICD (automatic cardioverter/defibrillator) present   . Arthritis    L hip   . Asthma    pt. uses albuterol inhaler in the spring for enviromental allergies   . CHF (congestive heart failure) ([place])   . Chronic systolic heart failure (HCC)    NICM, previous  heart transplant candidate with EF 10%  . Diabetes mellitus without complication ([place])   . Family history of adverse reaction to anesthesia    sister- headache & N&V  . History of hiatal hernia   . Hypertension   . Kidney stones 1997   passed spontaneously   . Neuromuscular disorder ([place])    essential tremor  . Pneumonia 2013   hosp. - IV antibiotics   . Presence of permanent cardiac pacemaker 2012   [employer]  . Shortness of breath dyspnea     Patient Active Problem List   Diagnosis Date Noted  . Chronic venous stasis dermatitis 09/08/2016  . Alcohol withdrawal delirium ([place]) 08/13/2016  . Elevated serum lactate dehydrogenase 08/13/2016  . Nausea & vomiting 08/13/2016  . SIRS (systemic inflammatory response syndrome) ([place]) 08/13/2016  . Diabetes mellitus type II, non insulin dependent ([place]) 08/13/2016  . Alcohol abuse with intoxication with complication ([place]) 40/98/1191  . Alcohol withdrawal delirium, acute, hyperactive ([place]) 08/13/2016  . Painful orthopaedic hardware ([place]) 04/29/2016  . S/P hardware removal 04/29/2016  . ICD (implantable cardioverter-defibrillator) in place 08/04/2015  . Dyspnea   . Shoulder fracture 07/20/2015  . Hypokalemia 07/17/2015  . Hypotension 07/17/2015  . Chronic systolic CHF (congestive heart failure), NYHA class 2 ([place]) 07/17/2015  . Dehydration 07/17/2015  . Hyponatremia 07/17/2015  . Fracture closed, humerus 07/17/2015  . Fall 07/17/2015    Past Surgical History:  Procedure Laterality Date  . BACK SURGERY  07/2000   fusion ,  [place]   . BONE EXCISION Right 04/29/2016   Procedure: EXCISION HETEROTOPIC BONE RIGHT HUMERUS;  Surgeon: Myrene Galas, MD;  Location: [facility];  Service: Orthopedics;  Laterality: Right;  . CARDIAC CATHETERIZATION  08/2012  . FRACTURE SURGERY Left    hip , r shoulder   . HARDWARE REMOVAL Right 04/29/2016   Procedure: HARDWARE REMOVAL;  Surgeon: Myrene Galas, MD;  Location: [facility] OR;  Service:  Orthopedics;  Laterality: Right;  . HERNIA REPAIR Right    inguinal   . HIP SURGERY Left    hardware present, for slipped joint   . ICD    . ORIF HUMERUS FRACTURE Right 07/20/2015   Procedure: OPEN REDUCTION INTERNAL FIXATION (ORIF) PROXIMAL HUMERUS FRACTURE;  Surgeon: Beverely Low, MD;  Location: [facility] OR;  Service: Orthopedics;  Laterality: Right;  . TONSILLECTOMY    . ulnar surgery Right        Home Medications    Prior to Admission medications   Medication Sig Start Date End Date Taking? Authorizing Provider  carvedilol (COREG) 3.125 MG tablet Take 1 tablet (3.125 mg total) by mouth 2 (two) times daily with a meal. 09/08/16   Graciella Freer, PA-C  CVS VITAMIN B12 1000 MCG tablet Take 1,000 mcg by mouth daily. 08/21/16   Historical Provider, MD  Cyanocobalamin (B-12) 1000 MCG CAPS Take 1 capsule by mouth daily. 08/21/16 09/20/16  Leana Roe Elgergawy, MD  folic acid (FOLVITE) 1 MG tablet Take 1 tablet (1 mg total) by mouth daily. 08/22/16   Leana Roe Elgergawy, MD  furosemide (LASIX) 40 MG tablet Take 1 tablet (40 mg total) by mouth daily. 09/11/16   Graciella Freer, PA-C  gabapentin (NEURONTIN) 600 MG tablet Take 0.5 tablets (300 mg total) by mouth 3 (three) times daily. 08/21/16   Leana Roe Elgergawy, MD  glipiZIDE (GLUCOTROL XL) 5 MG 24 hr tablet Take 5 mg by mouth daily with breakfast.  04/24/16   Historical Provider, MD  glucose monitoring kit (FREESTYLE) monitoring kit 1 each by Does not apply route 4 (four) times daily - after meals and at bedtime. 1 month Diabetic Testing Supplies for QAC-QHS accuchecks. 07/23/15   Shanker Levora Dredge, MD  hydrocortisone cream 1 % Apply 1 application topically 2 (two) times daily as needed for itching.    Historical Provider, MD  Multiple Vitamin (MULTIVITAMIN WITH MINERALS) TABS tablet Take 1 tablet by mouth daily. 08/22/16   Leana Roe Elgergawy, MD  potassium chloride SA (KLOR-CON M20) 20 MEQ tablet Take 1 tablet (20 mEq total) by mouth 2 (two) times  daily. 05/17/16   Amy D Filbert Schilder, NP  spironolactone (ALDACTONE) 25 MG tablet Take 0.5 tablets (12.5 mg total) by mouth daily. 09/11/16   Graciella Freer, PA-C  thiamine 100 MG tablet Take 1 tablet (100 mg total) by mouth daily. 08/22/16   Starleen Arms, MD  traMADol Janean Sark) 50 MG tablet  06/04/16   Historical Provider, MD    Family History Family History  Problem Relation Age of Onset  . Heart failure Mother   . Hypertension Mother   . CAD Father   . Hypertension Other   . CAD Other     Social History Social History  Substance Use Topics  . Smoking status: Never Smoker  . Smokeless tobacco: Current User    Types: Chew  . Alcohol use Yes     Comment: once q 2 weeks, occasional beer, wine, mixed drink     Allergies   Chlorhexidine and  Penicillins   Review of Systems Review of Systems  Constitutional: Negative for chills and fever.  Respiratory: Negative for cough, chest tightness and shortness of breath.   Cardiovascular: Positive for leg swelling. Negative for chest pain and palpitations.  Gastrointestinal: Negative for abdominal distention, abdominal pain, diarrhea, nausea and vomiting.  Genitourinary: Positive for difficulty urinating and flank pain. Negative for dysuria, frequency, hematuria and urgency.  Musculoskeletal: Negative for arthralgias, myalgias, neck pain and neck stiffness.  Skin: Negative for rash.  Allergic/Immunologic: Negative for immunocompromised state.  Neurological: Negative for dizziness, weakness, light-headedness, numbness and headaches.  All other systems reviewed and are negative.    Physical Exam Updated Vital Signs BP 108/75 (BP Location: Right Arm)   Pulse 80   Temp 98.2 F (36.8 C) (Oral)   Resp 19   Ht '6\' 4"'$  (1.93 m)   Wt 121.1 kg   SpO2 98%   BMI 32.50 kg/m   Physical Exam  Constitutional: He appears well-developed and well-nourished. No distress.  HENT:  Head: Normocephalic and atraumatic.  Eyes: Conjunctivae are  normal.  Neck: Neck supple.  Cardiovascular: Normal rate, regular rhythm and normal heart sounds.   Pulmonary/Chest: Effort normal. No respiratory distress. He has no wheezes. He has no rales.  Abdominal: Soft. Bowel sounds are normal. He exhibits no distension. There is tenderness. There is no rebound.  Mild left CVA tenderness. Tender to palpation in left upper lower quadrant  Musculoskeletal: He exhibits edema.  3+ edema to bilateral lower extremities, pitting  Neurological: He is alert.  Skin: Skin is warm and dry.  Nursing note and vitals reviewed.    ED Treatments / Results  Labs (all labs ordered are listed, but only abnormal results are displayed) Labs Reviewed  URINALYSIS, ROUTINE W REFLEX MICROSCOPIC - Abnormal; Notable for the following:       Result Value   Hgb urine dipstick SMALL (*)    Leukocytes, UA MODERATE (*)    Bacteria, UA RARE (*)    Squamous Epithelial / LPF 0-5 (*)    All other components within normal limits  CBC - Abnormal; Notable for the following:    WBC 11.6 (*)    RBC 3.51 (*)    Hemoglobin 12.1 (*)    HCT 35.8 (*)    MCV 102.0 (*)    MCH 34.5 (*)    Platelets 413 (*)    All other components within normal limits  BASIC METABOLIC PANEL - Abnormal; Notable for the following:    Potassium 2.9 (*)    CO2 21 (*)    Glucose, Bld 150 (*)    BUN 21 (*)    Creatinine, Ser 1.32 (*)    GFR calc non Af Amer 60 (*)    All other components within normal limits  HEPATIC FUNCTION PANEL - Abnormal; Notable for the following:    Total Protein 6.2 (*)    Albumin 3.0 (*)    AST 47 (*)    All other components within normal limits  URINE CULTURE  ETHANOL  I-STAT CG4 LACTIC ACID, ED  I-STAT CG4 LACTIC ACID, ED    EKG  EKG Interpretation None       Radiology Ct Renal Stone Study  Result Date: 09/09/2016 CLINICAL DATA:  Flank pain mostly on the left side. EXAM: CT ABDOMEN AND PELVIS WITHOUT CONTRAST TECHNIQUE: Multidetector CT imaging of the  abdomen and pelvis was performed following the standard protocol without IV contrast. COMPARISON:  08/13/2016 FINDINGS: Lower chest:  Unremarkable.  Hepatobiliary: No focal abnormality in the liver on this study without intravenous contrast. There is no evidence for gallstones, gallbladder wall thickening, or pericholecystic fluid. No intrahepatic or extrahepatic biliary dilation. Pancreas: No focal mass lesion. No dilatation of the main duct. No intraparenchymal cyst. No peripancreatic edema. Spleen: No splenomegaly. No focal mass lesion. Adrenals/Urinary Tract: No adrenal nodule or mass. Multiple nonobstructing right renal stones evident. Largest stone is in the upper pole measuring 7 mm. No right ureteral stones. No secondary changes in the right kidney or ureter. Multiple left renal stones are identified including a 1.4 x 1.8 x 2.7 cm stone in the region the renal pelvis, apparently obstructing the upper pole collecting system. Imaging features are similar to prior study. No left ureteral stones. No bladder stones. Stomach/Bowel: Stomach is nondistended. No gastric wall thickening. No evidence of outlet obstruction. Duodenum is normally positioned as is the ligament of Treitz. No small bowel wall thickening. No small bowel dilatation. The terminal ileum is normal. The appendix is normal. No gross colonic mass. No colonic wall thickening. No substantial diverticular change. Vascular/Lymphatic: There is abdominal aortic atherosclerosis without aneurysm. There is no gastrohepatic or hepatoduodenal ligament lymphadenopathy. No intraperitoneal or retroperitoneal lymphadenopathy. No pelvic sidewall lymphadenopathy. Reproductive: The prostate gland and seminal vesicles have normal imaging features. Other: No intraperitoneal free fluid. Musculoskeletal: Metallic pin identified left femoral head. Patient is status post lumbosacral fusion. Multiple old left rib fractures evident. IMPRESSION: 1. Stable exam. Bilateral  nephrolithiasis with a large dominant stone in the interpolar left kidney obstructing the upper pole collecting system. 2.  Abdominal Aortic Atherosclerois (ICD10-170.0) Electronically Signed   By: Kennith Center M.D.   On: 09/09/2016 20:31    Procedures Procedures (including critical care time)  Medications Ordered in ED Medications - No data to display   Initial Impression / Assessment and Plan / ED Course  I have reviewed the triage vital signs and the nursing notes.  Pertinent labs & imaging results that were available during my care of the patient were reviewed by me and considered in my medical decision making (see chart for details).     Patient emergency department to bilateral lower extremity swelling, left flank pain, known 2 x 1 cm intrarenal stone diagnosed over 3 weeks ago. Also difficulty urinating. Bladder scanner performed at bedside which showed 900 mL of urine in his bladder. We'll place Foley and send urinalysis. Will repeat CT scan. Labs show slightly elevated renal function compared to his baseline with creatinine 1.32. Patient does not want any pain medications at this time. Will monitor.   9:42 PM RN unable to put catheter in meeting resistance. Dr. McDiarmid called, will come see pt.   12:37 AM Dr. McDiarmid put in Foley, 1 L of urine out. Urine with no signs of infection. Urologist recommended starting patient on antibiotic outpatient, Flomax, follow-up with him in the office next week. Patient continues to have blood pressure and 90s systolic. Orthostatics obtained which show orthostatic changes with blood pressure dropping into the 60s systolic, rechecked twice. I gave the patient 500 mL of fluids, and will give 500 more. Patient denies any shortness of breath or chest pain. 40 mEq of potassium was given to help correct his hypokalemia. His lactic acid is normal, doubt sepsis. Will have him admitted for further treatment and fluid repletion.  Spoke with Dr. Montez Morita  with triad, will admit.    Final Clinical Impressions(s) / ED Diagnoses   Final diagnoses:  Urinary retention  Orthostatic hypotension  Peripheral edema  Hypokalemia    New Prescriptions New Prescriptions   No medications on file     Jeannett Senior, PA-C 09/10/16 [place], MD 09/22/16 [id number]

## 2016-09-09 NOTE — Consult Note (Signed)
Urology Consult  Referring physician: Micheline Waldo Reason for referral: Urine retention  Chief Complaint: Urine retention   History of Present Illness: 56 year old male urine retention unable to insert catheter; alcohol history and CHF history; has bilateral stones in kidneys and partially obstructing upper pole on left; no stones in ureters  Could not void today and scanned for 900 ml Saw his cardiologist yesterday Flow normally good; no UTI; no spray; catheter OK years ago with stones; has passed 6 stones  Modifying factors: There are no other modifying factors  Associated signs and symptoms: There are no other associated signs and symptoms Aggravating and relieving factors: There are no other aggravating or relieving factors Severity: Moderate Duration: Persistent  Past Medical History:  Diagnosis Date  . AICD (automatic cardioverter/defibrillator) present   . Arthritis    L hip   . Asthma    pt. uses albuterol inhaler in the spring for enviromental allergies   . CHF (congestive heart failure) ([place])   . Chronic systolic heart failure (HCC)    NICM, previous heart transplant candidate with EF 10%  . Diabetes mellitus without complication ([place])   . Family history of adverse reaction to anesthesia    sister- headache & N&V  . History of hiatal hernia   . Hypertension   . Kidney stones 1997   passed spontaneously   . Neuromuscular disorder ([place])    essential tremor  . Pneumonia 2013   hosp. - IV antibiotics   . Presence of permanent cardiac pacemaker 2012   [employer]  . Shortness of breath dyspnea    Past Surgical History:  Procedure Laterality Date  . BACK SURGERY  07/2000   fusion , [place]   . BONE EXCISION Right 04/29/2016   Procedure: EXCISION HETEROTOPIC BONE RIGHT HUMERUS;  Surgeon: Altamese Van Buren, MD;  Location: [facility];  Service: Orthopedics;  Laterality: Right;  . CARDIAC CATHETERIZATION  08/2012  . FRACTURE SURGERY Left    hip , r shoulder   .  HARDWARE REMOVAL Right 04/29/2016   Procedure: HARDWARE REMOVAL;  Surgeon: Altamese Silt, MD;  Location: [facility];  Service: Orthopedics;  Laterality: Right;  . HERNIA REPAIR Right    inguinal   . HIP SURGERY Left    hardware present, for slipped joint   . ICD    . ORIF HUMERUS FRACTURE Right 07/20/2015   Procedure: OPEN REDUCTION INTERNAL FIXATION (ORIF) PROXIMAL HUMERUS FRACTURE;  Surgeon: Netta Cedars, MD;  Location: [facility];  Service: Orthopedics;  Laterality: Right;  . TONSILLECTOMY    . ulnar surgery Right     Medications: I have reviewed the patient's current medications. Allergies:  Allergies  Allergen Reactions  . Chlorhexidine Itching  . Penicillins Other (See Comments)     childhood reaction, swelling & had to be given an antidote, but reports that he had antibiotic at a later time & it "had some penicillin in it & there was not any allergy reaction     Family History  Problem Relation Age of Onset  . Heart failure Mother   . Hypertension Mother   . CAD Father   . Hypertension Other   . CAD Other    Social History:  reports that he has never smoked. His smokeless tobacco use includes Chew. He reports that he drinks alcohol. He reports that he does not use drugs.  ROS: All systems are reviewed and negative except as noted. Rest negative   Physical Exam:  Vital signs in last  24 hours: Temp:  [98.2 F (36.8 C)] 98.2 F (36.8 C) (01/26 1605) Pulse Rate:  [80-95] 81 (01/26 1930) Resp:  [15-19] 19 (01/26 1744) BP: (93-114)/(45-75) 93/45 (01/26 1930) SpO2:  [97 %-100 %] 100 % (01/26 1930) Weight:  [121.1 kg (267 lb)] 121.1 kg (267 lb) (01/26 1606)  Cardiovascular: Skin warm; not flushed Respiratory: Breaths quiet; no shortness of breath Abdomen: No masses Neurological: Normal sensation to touch Musculoskeletal: Normal motor function arms and legs Lymphatics: No inguinal adenopathy Skin: No rashes Genitourinary:genitalia normal  Laboratory Data:  Results for orders  placed or performed during the hospital encounter of 09/09/16 (from the past 72 hour(s))  CBC     Status: Abnormal   Collection Time: 09/09/16  4:14 PM  Result Value Ref Range   WBC 11.6 (H) 4.0 - 10.5 K/uL   RBC 3.51 (L) 4.22 - 5.81 MIL/uL   Hemoglobin 12.1 (L) 13.0 - 17.0 g/dL   HCT 35.8 (L) 39.0 - 52.0 %   MCV 102.0 (H) 78.0 - 100.0 fL   MCH 34.5 (H) 26.0 - 34.0 pg   MCHC 33.8 30.0 - 36.0 g/dL   RDW 14.7 11.5 - 15.5 %   Platelets 413 (H) 150 - 400 K/uL  Basic metabolic panel     Status: Abnormal   Collection Time: 09/09/16  4:14 PM  Result Value Ref Range   Sodium 136 135 - 145 mmol/L   Potassium 2.9 (L) 3.5 - 5.1 mmol/L   Chloride 105 101 - 111 mmol/L   CO2 21 (L) 22 - 32 mmol/L   Glucose, Bld 150 (H) 65 - 99 mg/dL   BUN 21 (H) 6 - 20 mg/dL   Creatinine, Ser 1.32 (H) 0.61 - 1.24 mg/dL   Calcium 8.9 8.9 - 10.3 mg/dL   GFR calc non Af Amer 60 (L) >60 mL/min   GFR calc Af Amer >60 >60 mL/min    Comment: (NOTE) The eGFR has been calculated using the CKD EPI equation. This calculation has not been validated in all clinical situations. eGFR's persistently <60 mL/min signify possible Chronic Kidney Disease.    Anion gap 10 5 - 15  Ethanol     Status: None   Collection Time: 09/09/16  8:56 PM  Result Value Ref Range   Alcohol, Ethyl (B) <5 <5 mg/dL    Comment:        LOWEST DETECTABLE LIMIT FOR SERUM ALCOHOL IS 5 mg/dL FOR MEDICAL PURPOSES ONLY   Hepatic function panel     Status: Abnormal   Collection Time: 09/09/16  8:56 PM  Result Value Ref Range   Total Protein 6.2 (L) 6.5 - 8.1 g/dL   Albumin 3.0 (L) 3.5 - 5.0 g/dL   AST 47 (H) 15 - 41 U/L   ALT 28 17 - 63 U/L   Alkaline Phosphatase 64 38 - 126 U/L   Total Bilirubin 0.6 0.3 - 1.2 mg/dL   Bilirubin, Direct 0.2 0.1 - 0.5 mg/dL   Indirect Bilirubin 0.4 0.3 - 0.9 mg/dL  I-Stat CG4 Lactic Acid, ED     Status: None   Collection Time: 09/09/16  9:09 PM  Result Value Ref Range   Lactic Acid, Venous 1.37 0.5 - 1.9  mmol/L   No results found for this or any previous visit (from the past 240 hour(s)). Creatinine:  Recent Labs  09/09/16 1614  CREATININE 1.32*    Xrays: See report/chart See above  Impression/Assessment:  Retention ? cause  Plan:  14 Fr Coude  went in with some resistance; clear urine return Send home with emperic antibiotic and flomax and he will see me for trail of voding next week  Justin Bernard A 09/09/2016, 9:52 PM

## 2016-09-09 NOTE — ED Notes (Signed)
Patient transported to CT 

## 2016-09-09 NOTE — ED Triage Notes (Signed)
Pt reports PA from [facility] sent him over after his lab work showed renal failure. Pt reports diagnosed with kidney stone here 2 weeks ago. Pt a/ox4, resp e/u, nad.

## 2016-09-09 NOTE — ED Notes (Signed)
Bladder scan: 902 mL

## 2016-09-10 ENCOUNTER — Encounter (HOSPITAL_COMMUNITY): Payer: Self-pay | Admitting: *Deleted

## 2016-09-10 DIAGNOSIS — E876 Hypokalemia: Secondary | ICD-10-CM | POA: Diagnosis not present

## 2016-09-10 DIAGNOSIS — R339 Retention of urine, unspecified: Secondary | ICD-10-CM

## 2016-09-10 DIAGNOSIS — N200 Calculus of kidney: Secondary | ICD-10-CM

## 2016-09-10 DIAGNOSIS — I5022 Chronic systolic (congestive) heart failure: Secondary | ICD-10-CM

## 2016-09-10 DIAGNOSIS — I951 Orthostatic hypotension: Secondary | ICD-10-CM | POA: Diagnosis present

## 2016-09-10 LAB — CBC
HCT: 31.9 % — ABNORMAL LOW (ref 39.0–52.0)
Hemoglobin: 10.5 g/dL — ABNORMAL LOW (ref 13.0–17.0)
MCH: 33.8 pg (ref 26.0–34.0)
MCHC: 32.9 g/dL (ref 30.0–36.0)
MCV: 102.6 fL — ABNORMAL HIGH (ref 78.0–100.0)
PLATELETS: 356 10*3/uL (ref 150–400)
RBC: 3.11 MIL/uL — ABNORMAL LOW (ref 4.22–5.81)
RDW: 14.8 % (ref 11.5–15.5)
WBC: 8.2 10*3/uL (ref 4.0–10.5)

## 2016-09-10 LAB — BASIC METABOLIC PANEL
ANION GAP: 6 (ref 5–15)
BUN: 16 mg/dL (ref 6–20)
CALCIUM: 8.3 mg/dL — AB (ref 8.9–10.3)
CO2: 23 mmol/L (ref 22–32)
CREATININE: 1.01 mg/dL (ref 0.61–1.24)
Chloride: 111 mmol/L (ref 101–111)
GLUCOSE: 105 mg/dL — AB (ref 65–99)
Potassium: 2.8 mmol/L — ABNORMAL LOW (ref 3.5–5.1)
Sodium: 140 mmol/L (ref 135–145)

## 2016-09-10 LAB — POTASSIUM: Potassium: 3.5 mmol/L (ref 3.5–5.1)

## 2016-09-10 LAB — GLUCOSE, CAPILLARY
GLUCOSE-CAPILLARY: 105 mg/dL — AB (ref 65–99)
Glucose-Capillary: 99 mg/dL (ref 65–99)

## 2016-09-10 LAB — MAGNESIUM: Magnesium: 1.9 mg/dL (ref 1.7–2.4)

## 2016-09-10 MED ORDER — HYDROXYZINE HCL 25 MG PO TABS: 25.0000 mg | ORAL_TABLET | Freq: Three times a day (TID) | ORAL | 0 refills | Status: DC | PRN

## 2016-09-10 MED ORDER — ACETAMINOPHEN 650 MG RE SUPP: 650.0000 mg | Freq: Four times a day (QID) | RECTAL | Status: DC | PRN

## 2016-09-10 MED ORDER — ONDANSETRON HCL 4 MG PO TABS: 4.0000 mg | ORAL_TABLET | Freq: Four times a day (QID) | ORAL | Status: DC | PRN

## 2016-09-10 MED ORDER — TRAMADOL HCL 50 MG PO TABS: 50.0000 mg | ORAL_TABLET | Freq: Four times a day (QID) | ORAL | Status: DC | PRN

## 2016-09-10 MED ORDER — ACETAMINOPHEN 325 MG PO TABS
650.0000 mg | ORAL_TABLET | Freq: Four times a day (QID) | ORAL | Status: DC | PRN
Start: 2016-09-10 — End: 2016-09-10

## 2016-09-10 MED ORDER — ONDANSETRON HCL 4 MG/2ML IJ SOLN: 4.0000 mg | Freq: Four times a day (QID) | INTRAMUSCULAR | Status: DC | PRN

## 2016-09-10 MED ORDER — SODIUM CHLORIDE 0.9 % IV SOLN
INTRAVENOUS | Status: AC
  Administered 2016-09-10: 04:00:00 via INTRAVENOUS

## 2016-09-10 MED ORDER — CIPROFLOXACIN HCL 500 MG PO TABS: 500.0000 mg | ORAL_TABLET | Freq: Two times a day (BID) | ORAL | 0 refills | Status: DC

## 2016-09-10 MED ORDER — POTASSIUM CHLORIDE CRYS ER 20 MEQ PO TBCR
40.0000 meq | EXTENDED_RELEASE_TABLET | Freq: Once | ORAL | Status: AC
  Administered 2016-09-10: 40 meq via ORAL
  Filled 2016-09-10: qty 2

## 2016-09-10 MED ORDER — HYDROXYZINE HCL 25 MG PO TABS
25.0000 mg | ORAL_TABLET | Freq: Three times a day (TID) | ORAL | Status: DC | PRN
  Administered 2016-09-10: 25 mg via ORAL
  Filled 2016-09-10: qty 1

## 2016-09-10 MED ORDER — MAGNESIUM SULFATE IN D5W 1-5 GM/100ML-% IV SOLN
1.0000 g | Freq: Once | INTRAVENOUS | Status: AC
  Administered 2016-09-10: 1 g via INTRAVENOUS
  Filled 2016-09-10: qty 100

## 2016-09-10 MED ORDER — SODIUM CHLORIDE 0.9 % IV BOLUS (SEPSIS)
500.0000 mL | Freq: Once | INTRAVENOUS | Status: AC
  Administered 2016-09-10: 500 mL via INTRAVENOUS

## 2016-09-10 MED ORDER — GABAPENTIN 600 MG PO TABS
300.0000 mg | ORAL_TABLET | Freq: Three times a day (TID) | ORAL | Status: DC
  Administered 2016-09-10: 300 mg via ORAL
  Filled 2016-09-10: qty 1

## 2016-09-10 MED ORDER — FUROSEMIDE 40 MG PO TABS: 40.0000 mg | ORAL_TABLET | Freq: Every day | ORAL | 0 refills | Status: DC

## 2016-09-10 MED ORDER — INSULIN ASPART 100 UNIT/ML ~~LOC~~ SOLN: 0.0000 [IU] | Freq: Three times a day (TID) | SUBCUTANEOUS | Status: DC

## 2016-09-10 MED ORDER — POTASSIUM CHLORIDE CRYS ER 20 MEQ PO TBCR
40.0000 meq | EXTENDED_RELEASE_TABLET | Freq: Two times a day (BID) | ORAL | Status: DC
  Administered 2016-09-10: 40 meq via ORAL
  Filled 2016-09-10: qty 2

## 2016-09-10 MED ORDER — SODIUM CHLORIDE 0.9% FLUSH
3.0000 mL | Freq: Two times a day (BID) | INTRAVENOUS | Status: DC
  Administered 2016-09-10 (×2): 3 mL via INTRAVENOUS

## 2016-09-10 MED ORDER — SPIRONOLACTONE 25 MG PO TABS: 12.5000 mg | ORAL_TABLET | Freq: Every day | ORAL | 3 refills | Status: DC

## 2016-09-10 NOTE — Discharge Summary (Signed)
Pt got discharged to home, discharge instructions provided and patient showed understanding to it, IV taken out,Telemonitor DC,pt left unit in wheelchair with all of the belongings accompanied with a family member (Sister) in a stable condition

## 2016-09-10 NOTE — Discharge Summary (Signed)
Physician Discharge Summary  Justin Bernard POE:[id number] DOB: 01-Nov-1960 DOA: 09/09/2016  PCP: Marda Stalker, PA-C  Admit date: 09/09/2016 Discharge date: 09/10/2016  Time spent: 45 minutes  Recommendations for Outpatient Follow-up:  Patient will be discharged to home.  Patient will need to follow up with primary care provider within one week of discharge, repeat BMP.  Follow up with cardiology this week.  Hold diuretics until 09/11/16.  Follow up with Dr. Matilde Sprang, urologist, in one week. Patient should continue medications as prescribed.  Patient should follow a heart healthy/carb modified diet.   Discharge Diagnoses:  Orthostatic hypotension Hypokalemia Acute urinary retention with nephrolithiasis Acute kidney injury Chronic systolic heart failure Diabetes mellitus, type II with neuropathy Chronic pain Alcohol abuse  Discharge Condition: Stable  Diet recommendation:  heart healthy/carb modified diet  Filed Weights   09/09/16 1606 09/10/16 0143  Weight: 121.1 kg (267 lb) 114.8 kg (253 lb)    History of present illness:  On 09/10/2016 by Dr. Lily Kocher Rondell Bernard is a 56 y.o. gentleman with a history of Nonischemic cardiomyopathy, chronic systolic heart failure (last EF 40-45% by echo earlier this month), S/P PPM/AICD placement, HTN, DM, and kidney stones who presented to the ED for evaluation of urinary retention and left flank pain.  He has been unable to void.  No dysuria.  No nausea, vomiting, or fever.   No diarrhea.  No weakness.  No syncope.  He reports a history of orthostatic hypotension "for at least the past year".  He just saw his cardiologist yesterday, and he was advised to hold his lasix and spironolactone for two days, then resume at half dose.  His Coreg dose was also decreased.  Hospital Course:  Orthostatic hypotension -likely overdiuresed but autonomic dysfunction, adrenal insufficiency would also be on the differential. -Patient has had a  history of orthostatic hypotension -Recently saw Dr. Aundra Dubin and medication doses were reduced. Patient also checks his blood pressure and knows holding parameters. -Restart home medications at discharge -Follow up with Dr. Aundra Dubin -?possibly consider midodrine?  Hypokalemia -potassium 2.8, replaced-repeat 3.5 -Magnesium 1.9 -continue potassium supplementation  Acute urinary retention with nephrolithiasis -Seen by urology, Dr. Matilde Sprang in the ER, foley catheter placed -Patient will be discharged with foley catheter and will need to follow up with urology in one week for voiding trial -continue flomax and Cipro -UA: rare bacteria, 6-30WBC, mod leukocytes -No urine culture collected before antibiotics started- will attempt to add on to previous urine collected  Acute kidney injury -Likely secondary urinary retention vs diuresis/hypovolemia -Creatinine upon admission 1.32, baseline 0.7 -Given IVF -Creatinine currently 1.01 -repeat BMP in one week  Chronic systolic heart failure -Echocardiogram 08/18/2016 EF 40-45% -Currently appears euvolemic and compensated -Continue diuretics at discharge- resume on 1/28.  Recently dose reduced by cardiology as an outpatient -Follow up with Dr. Aundra Dubin. Per outpatient notes 09/08/2016: patient is to call the office and notify them of how he is doing, follow up in 2 weeks.   Diabetes mellitus, type II with neuropathy -Glipizide held, continue upon discharge -continue neurontin for neuropathy  Chronic pain -Continue neurontin and tramadol   Alcohol abuse -Currently stable, no withdrawal type symptoms -Alcohol level <5 on admission  Procedures: Foley catheter placed in ER by urology  Consultations: Urology  Discharge Exam: Vitals:   09/10/16 0757 09/10/16 1021  BP: 106/63 (!) 96/55  Pulse: 74 64  Resp:  20  Temp:  98 F (36.7 C)     General: Well developed, well nourished, NAD, appears  stated age  20: NCAT,mucous membranes  moist.  Cardiovascular: S1 S2 auscultated, no murmurs, RRR  Respiratory: Clear to auscultation bilaterally with equal chest rise  Abdomen: Soft, nontender, nondistended, + bowel sounds  Extremities: warm dry without cyanosis clubbing. LE edema.  Neuro: AAOx3, nonfocal  Psych: Normal affect and demeanor with intact judgement and insight  Discharge Instructions Discharge Instructions    Discharge instructions    Complete by:  As directed    Patient will be discharged to home.  Patient will need to follow up with primary care provider within one week of discharge, repeat BMP.  Follow up with cardiology this week.  Hold diuretics until 09/11/16.  Follow up with Dr. Matilde Sprang, urologist, in one week. Patient should continue medications as prescribed.  Patient should follow a heart healthy/carb modified diet.     Current Discharge Medication List    START taking these medications   Details  ciprofloxacin (CIPRO) 500 MG tablet Take 1 tablet (500 mg total) by mouth 2 (two) times daily. Qty: 14 tablet, Refills: 0    hydrOXYzine (ATARAX/VISTARIL) 25 MG tablet Take 1 tablet (25 mg total) by mouth 3 (three) times daily as needed for itching. Qty: 30 tablet, Refills: 0      CONTINUE these medications which have CHANGED   Details  furosemide (LASIX) 40 MG tablet Take 1 tablet (40 mg total) by mouth daily. Start on 09/11/16. Qty: 30 tablet, Refills: 0    spironolactone (ALDACTONE) 25 MG tablet Take 0.5 tablets (12.5 mg total) by mouth daily. Resume 1/28 Qty: 45 tablet, Refills: 3    traMADol (ULTRAM) 50 MG tablet Take 1 tablet (50 mg total) by mouth every 6 (six) hours as needed. Qty: 30 tablet      CONTINUE these medications which have NOT CHANGED   Details  carvedilol (COREG) 3.125 MG tablet Take 1 tablet (3.125 mg total) by mouth 2 (two) times daily with a meal. Qty: 180 tablet, Refills: 3    Cyanocobalamin (B-12) 1000 MCG CAPS Take 1 capsule by mouth daily. Qty: 30 capsule,  Refills: 0    folic acid (FOLVITE) 1 MG tablet Take 1 tablet (1 mg total) by mouth daily. Qty: 30 tablet, Refills: 0    gabapentin (NEURONTIN) 600 MG tablet Take 0.5 tablets (300 mg total) by mouth 3 (three) times daily. Qty: 45 tablet, Refills: 0    glipiZIDE (GLUCOTROL XL) 5 MG 24 hr tablet Take 5 mg by mouth daily with breakfast.     glucose monitoring kit (FREESTYLE) monitoring kit 1 each by Does not apply route 4 (four) times daily - after meals and at bedtime. 1 month Diabetic Testing Supplies for QAC-QHS accuchecks. Qty: 1 each, Refills: 1    hydrocortisone cream 1 % Apply 1 application topically 2 (two) times daily as needed for itching.    Multiple Vitamin (MULTIVITAMIN WITH MINERALS) TABS tablet Take 1 tablet by mouth daily. Qty: 30 tablet, Refills: 0    potassium chloride SA (KLOR-CON M20) 20 MEQ tablet Take 1 tablet (20 mEq total) by mouth 2 (two) times daily. Qty: 60 tablet, Refills: 6    thiamine 100 MG tablet Take 1 tablet (100 mg total) by mouth daily. Qty: 90 tablet, Refills: 1      STOP taking these medications     [employer] VITAMIN B12 1000 MCG tablet        Allergies  Allergen Reactions  . Chlorhexidine Itching  . Penicillins Other (See Comments)     childhood reaction, swelling &  had to be given an antidote, but reports that he had antibiotic at a later time & it "had some penicillin in it & there was not any allergy reaction    Follow-up Information    Loralie Champagne, MD. Call on 09/12/2016.   Specialty:  Cardiology Why:  To set up follow up appointment: CHF and hypotension.  Contact information: [address] [place] [address] [phone]        Reece Packer, MD. Call on 09/12/2016.   Specialty:  Urology Why:  Set up appointment for urinary retention.  Contact information: [place] [address] [phone]            The results of significant diagnostics from this hospitalization (including  imaging, microbiology, ancillary and laboratory) are listed below for reference.    Significant Diagnostic Studies: Dg Chest 2 View  Result Date: 08/13/2016 CLINICAL DATA:  Hypotension, lightheadedness x1 day. Pt denies SOB and CP. Hx of HTN, DM, CHF, asthma, s/p AICD, PNA. Nonsmoker. EXAM: CHEST  2 VIEW COMPARISON:  12/08/2015 FINDINGS: Left-sided transvenous pacemaker lead to the right ventricle. Heart size is normal. The lungs are free of focal consolidations and pleural effusions. No pulmonary edema. Remote rib fractures. IMPRESSION: No evidence for acute  abnormality. Electronically Signed   By: Nolon Nations M.D.   On: 08/13/2016 16:52   Ct Head Wo Contrast  Result Date: 08/18/2016 CLINICAL DATA:  Tremors for 2 months EXAM: CT HEAD WITHOUT CONTRAST TECHNIQUE: Contiguous axial images were obtained from the base of the skull through the vertex without intravenous contrast. COMPARISON:  None. FINDINGS: Brain: There is slight diffuse atrophy for age. Prominence of the cisterna magna is an anatomic variant. There is no intracranial mass, hemorrhage, extra-axial fluid collection, or midline shift. Gray-white compartments appear normal. No acute infarct evident. Vascular: There is no hyperdense vessel. There is no appreciable vascular calcification. Skull: The bony calvarium appears intact. There is calcification in the scalp in the right parietal region. Sinuses/Orbits: There is mild mucosal thickening in several anterior ethmoid air cells. Visualized paranasal sinuses elsewhere are clear. Orbits appear symmetric bilaterally. Other: Visualized mastoid air cells are clear. IMPRESSION: Mild atrophy for age. No intracranial mass, hemorrhage, or extra-axial fluid collection. Gray-white compartments appear normal. Mucosal thickening noted in several anterior ethmoid air cells. Calcification in the right parietal scalp region may represent residua of old trauma. No bony abnormality in this area. Electronically  Signed   By: Lowella Grip III M.D.   On: 08/18/2016 09:52   Ct Renal Stone Study  Result Date: 09/09/2016 CLINICAL DATA:  Flank pain mostly on the left side. EXAM: CT ABDOMEN AND PELVIS WITHOUT CONTRAST TECHNIQUE: Multidetector CT imaging of the abdomen and pelvis was performed following the standard protocol without IV contrast. COMPARISON:  08/13/2016 FINDINGS: Lower chest:  Unremarkable. Hepatobiliary: No focal abnormality in the liver on this study without intravenous contrast. There is no evidence for gallstones, gallbladder wall thickening, or pericholecystic fluid. No intrahepatic or extrahepatic biliary dilation. Pancreas: No focal mass lesion. No dilatation of the main duct. No intraparenchymal cyst. No peripancreatic edema. Spleen: No splenomegaly. No focal mass lesion. Adrenals/Urinary Tract: No adrenal nodule or mass. Multiple nonobstructing right renal stones evident. Largest stone is in the upper pole measuring 7 mm. No right ureteral stones. No secondary changes in the right kidney or ureter. Multiple left renal stones are identified including a 1.4 x 1.8 x 2.7 cm stone in the region the renal pelvis,  apparently obstructing the upper pole collecting system. Imaging features are similar to prior study. No left ureteral stones. No bladder stones. Stomach/Bowel: Stomach is nondistended. No gastric wall thickening. No evidence of outlet obstruction. Duodenum is normally positioned as is the ligament of Treitz. No small bowel wall thickening. No small bowel dilatation. The terminal ileum is normal. The appendix is normal. No gross colonic mass. No colonic wall thickening. No substantial diverticular change. Vascular/Lymphatic: There is abdominal aortic atherosclerosis without aneurysm. There is no gastrohepatic or hepatoduodenal ligament lymphadenopathy. No intraperitoneal or retroperitoneal lymphadenopathy. No pelvic sidewall lymphadenopathy. Reproductive: The prostate gland and seminal vesicles  have normal imaging features. Other: No intraperitoneal free fluid. Musculoskeletal: Metallic pin identified left femoral head. Patient is status post lumbosacral fusion. Multiple old left rib fractures evident. IMPRESSION: 1. Stable exam. Bilateral nephrolithiasis with a large dominant stone in the interpolar left kidney obstructing the upper pole collecting system. 2.  Abdominal Aortic Atherosclerois (ICD10-170.0) Electronically Signed   By: Kennith Center M.D.   On: 09/09/2016 20:31   Ct Angio Abd/pel W And/or Wo Contrast  Result Date: 08/13/2016 CLINICAL DATA:  Abdominal pain. EXAM: CTA ABDOMEN AND PELVIS wITHOUT AND WITH CONTRAST TECHNIQUE: Multidetector CT imaging of the abdomen and pelvis was performed using the standard protocol during bolus administration of intravenous contrast. Multiplanar reconstructed images and MIPs were obtained and reviewed to evaluate the vascular anatomy. CONTRAST:  100 mL of Isovue 370 intravenously. COMPARISON:  CT scan of December 04, 2005. FINDINGS: VASCULAR Aorta: Normal caliber aorta without aneurysm, dissection, vasculitis or significant stenosis. Celiac: Patent without evidence of aneurysm, dissection, vasculitis or significant stenosis. SMA: Patent without evidence of aneurysm, dissection, vasculitis or significant stenosis. Renals: Both renal arteries are patent without evidence of aneurysm, dissection, vasculitis, fibromuscular dysplasia or significant stenosis. IMA: Patent without evidence of aneurysm, dissection, vasculitis or significant stenosis. Inflow: Patent without evidence of aneurysm, dissection, vasculitis or significant stenosis. Proximal Outflow: Bilateral common femoral and visualized portions of the superficial and profunda femoral arteries are patent without evidence of aneurysm, dissection, vasculitis or significant stenosis. Veins: No obvious venous abnormality within the limitations of this arterial phase study. Review of the MIP images confirms the  above findings. NON-VASCULAR Lower chest: No acute abnormality. Hepatobiliary: Fatty infiltration of the liver is noted. No gallstones are noted. Pancreas: Unremarkable. No pancreatic ductal dilatation or surrounding inflammatory changes. Spleen: Normal in size without focal abnormality. Adrenals/Urinary Tract: Adrenal glands appear normal. Bilateral nephrolithiasis is noted. Largest calculus measures 27 x 17 mm in left renal pelvis and upper infundibulum. This results in obstruction of the left upper pole collecting system. No ureteral calculus is noted. Stomach/Bowel: Stomach is within normal limits. Appendix appears normal. No evidence of bowel wall thickening, distention, or inflammatory changes. Lymphatic: No significant adenopathy is noted. Reproductive: Prostatic calcifications are noted. Other: No abdominal wall hernia or abnormality. No abdominopelvic ascites. Musculoskeletal: No acute or significant osseous findings. IMPRESSION: VASCULAR No evidence of significant mesenteric or renal artery stenosis. No evidence of abdominal aortic aneurysm or dissection. NON-VASCULAR Fatty infiltration the liver. Bilateral nephrolithiasis is noted. 27 x 17 mm calculus is noted in left renal pelvis and upper pole infundibulum, resulting in severe dilatation of left upper pole collecting system. Electronically Signed   By: Lupita Raider, M.D.   On: 08/13/2016 19:04    Microbiology: No results found for this or any previous visit (from the past 240 hour(s)).   Labs: Basic Metabolic Panel:  Recent Labs Lab 09/09/16 1614 09/10/16 0426  09/10/16 1120  NA 136 140  --   K 2.9* 2.8* 3.5  CL 105 111  --   CO2 21* 23  --   GLUCOSE 150* 105*  --   BUN 21* 16  --   CREATININE 1.32* 1.01  --   CALCIUM 8.9 8.3*  --   MG  --   --  1.9   Liver Function Tests:  Recent Labs Lab 09/09/16 2056  AST 47*  ALT 28  ALKPHOS 64  BILITOT 0.6  PROT 6.2*  ALBUMIN 3.0*   No results for input(s): LIPASE, AMYLASE in  the last 168 hours. No results for input(s): AMMONIA in the last 168 hours. CBC:  Recent Labs Lab 09/09/16 1614 09/10/16 0426  WBC 11.6* 8.2  HGB 12.1* 10.5*  HCT 35.8* 31.9*  MCV 102.0* 102.6*  PLT 413* 356   Cardiac Enzymes: No results for input(s): CKTOTAL, CKMB, CKMBINDEX, TROPONINI in the last 168 hours. BNP: BNP (last 3 results)  Recent Labs  11/12/15 1033 09/08/16 1524  BNP 31.2 236.4*    ProBNP (last 3 results) No results for input(s): PROBNP in the last 8760 hours.  CBG:  Recent Labs Lab 09/10/16 0624 09/10/16 1219  GLUCAP 99 105*       Signed:  Leroy Pettway  [facility] 09/10/2016, 1:39 PM

## 2016-09-10 NOTE — Progress Notes (Addendum)
Pt's orthostatic vitals taken this morning, it was positive, MD aware, According to MD, it's a chronic condition, and pt is also aware about his medicine and blood pressure status. Pt is being discharged today, Atarax had given for complain of itching, will continue to monitor the patient.  Lurline Del, RN

## 2016-09-10 NOTE — H&P (Signed)
History and Physical    Justin Bernard PPJ:[id number] DOB: [birth year] DOA: 09/09/2016  PCP: Marda Stalker, PA-C  Cardiology: Aundra Dubin  Patient coming from: Home  Chief Complaint: Urinary retention, flank pain  HPI: Justin Bernard is a 56 y.o. gentleman with a history of Nonischemic cardiomyopathy, chronic systolic heart failure (last EF 40-45% by echo earlier this month), S/P PPM/AICD placement, HTN, DM, and kidney stones who presented to the ED for evaluation of urinary retention and left flank pain.  He has been unable to void.  No dysuria.  No nausea, vomiting, or fever.   No diarrhea.  No weakness.  No syncope.  He reports a history of orthostatic hypotension "for at least the past year".  He just saw his cardiologist yesterday, and he was advised to hold his lasix and spironolactone for two days, then resume at half dose.  His Coreg dose was also decreased.  ED Course: Urology was called to place foley catheter.  The patient had 1L of urine in his bladder.  Urine culture ordered.  Cipro and flomax recommended.  The patient was actually cleared to discharge home with outpatient follow-up (foley catheter to stay in place) from a urology standpoint, but he continued to have borderline blood pressures with positive orthostatics after a 500cc NS bolus.  Hospitalist asked to place in observation.  A second NS bolus was ordered at time of admission.  Review of Systems: As per HPI otherwise 10 point review of systems negative.    Past Medical History:  Diagnosis Date  . AICD (automatic cardioverter/defibrillator) present   . Arthritis    L hip   . Asthma    pt. uses albuterol inhaler in the spring for enviromental allergies   . CHF (congestive heart failure) ([place])   . Chronic systolic heart failure (HCC)    NICM, previous heart transplant candidate with EF 10%  . Diabetes mellitus without complication ([place])   . Family history of adverse reaction to anesthesia    sister- headache &  N&V  . History of hiatal hernia   . Hypertension   . Kidney stones 1997   passed spontaneously   . Neuromuscular disorder ([place])    essential tremor  . Pneumonia 2013   hosp. - IV antibiotics   . Presence of permanent cardiac pacemaker 2012   [employer]  . Shortness of breath dyspnea     Past Surgical History:  Procedure Laterality Date  . BACK SURGERY  07/2000   fusion , [place]   . BONE EXCISION Right 04/29/2016   Procedure: EXCISION HETEROTOPIC BONE RIGHT HUMERUS;  Surgeon: Altamese Bainbridge, MD;  Location: [facility];  Service: Orthopedics;  Laterality: Right;  . CARDIAC CATHETERIZATION  08/2012  . FRACTURE SURGERY Left    hip , r shoulder   . HARDWARE REMOVAL Right 04/29/2016   Procedure: HARDWARE REMOVAL;  Surgeon: Altamese Smicksburg, MD;  Location: [place];  Service: Orthopedics;  Laterality: Right;  . HERNIA REPAIR Right    inguinal   . HIP SURGERY Left    hardware present, for slipped joint   . ICD    . ORIF HUMERUS FRACTURE Right 07/20/2015   Procedure: OPEN REDUCTION INTERNAL FIXATION (ORIF) PROXIMAL HUMERUS FRACTURE;  Surgeon: Netta Cedars, MD;  Location: [place];  Service: Orthopedics;  Laterality: Right;  . TONSILLECTOMY    . ulnar surgery Right      reports that he has never smoked. His smokeless tobacco use includes Chew. He reports that he drinks alcohol. He  reports that he does not use drugs.  Allergies  Allergen Reactions  . Chlorhexidine Itching  . Penicillins Other (See Comments)     childhood reaction, swelling & had to be given an antidote, but reports that he had antibiotic at a later time & it "had some penicillin in it & there was not any allergy reaction     Family History  Problem Relation Age of Onset  . Heart failure Mother   . Hypertension Mother   . CAD Father   . Hypertension Other   . CAD Other      Prior to Admission medications   Medication Sig Start Date End Date Taking? Authorizing Provider  carvedilol (COREG) 3.125 MG tablet Take  1 tablet (3.125 mg total) by mouth 2 (two) times daily with a meal. 09/08/16  Yes [name] Friar, PA-C  [employer] VITAMIN B12 1000 MCG tablet Take 1,000 mcg by mouth daily. 08/21/16  Yes Historical Provider, MD  Cyanocobalamin (B-12) 1000 MCG CAPS Take 1 capsule by mouth daily. 08/21/16 09/20/16 Yes Silver Huguenin Elgergawy, MD  folic acid (FOLVITE) 1 MG tablet Take 1 tablet (1 mg total) by mouth daily. 08/22/16  Yes Albertine Patricia, MD  furosemide (LASIX) 40 MG tablet Take 1 tablet (40 mg total) by mouth daily. Patient taking differently: Take 20-40 mg by mouth daily. 40 mg in the morning, 20 mg in the evening. 09/11/16  Yes [name] Friar, PA-C  gabapentin (NEURONTIN) 600 MG tablet Take 0.5 tablets (300 mg total) by mouth 3 (three) times daily. 08/21/16  Yes Silver Huguenin Elgergawy, MD  glipiZIDE (GLUCOTROL XL) 5 MG 24 hr tablet Take 5 mg by mouth daily with breakfast.  04/24/16  Yes Historical Provider, MD  glucose monitoring kit (FREESTYLE) monitoring kit 1 each by Does not apply route 4 (four) times daily - after meals and at bedtime. 1 month Diabetic Testing Supplies for QAC-QHS accuchecks. 07/23/15  Yes Shanker Kristeen Mans, MD  hydrocortisone cream 1 % Apply 1 application topically 2 (two) times daily as needed for itching.   Yes Historical Provider, MD  Multiple Vitamin (MULTIVITAMIN WITH MINERALS) TABS tablet Take 1 tablet by mouth daily. 08/22/16  Yes Silver Huguenin Elgergawy, MD  potassium chloride SA (KLOR-CON M20) 20 MEQ tablet Take 1 tablet (20 mEq total) by mouth 2 (two) times daily. Patient taking differently: Take 20-40 mEq by mouth 2 (two) times daily. 2 tabs in the morning, and 1 tablet in the evening. 05/17/16  Yes Amy D Clegg, NP  spironolactone (ALDACTONE) 25 MG tablet Take 0.5 tablets (12.5 mg total) by mouth daily. Patient taking differently: Take 25 mg by mouth daily.  09/11/16  Yes [name] Friar, PA-C  thiamine 100 MG tablet Take 1 tablet (100 mg total) by mouth daily. 08/22/16  Yes Albertine Patricia, MD  traMADol (ULTRAM) 50 MG tablet  06/04/16  Yes Historical Provider, MD    Physical Exam: Vitals:   09/10/16 0030 09/10/16 0100 09/10/16 0113 09/10/16 0143  BP: (!) 84/52 92/57  97/72  Pulse: 78 72  72  Resp: '16 14  16  '$ Temp:   98.3 F (36.8 C) 98.3 F (36.8 C)  TempSrc:   Oral Oral  SpO2: 93% 99%  97%  Weight:    114.8 kg (253 lb)  Height:    '6\' 4"'$  (1.93 m)      Constitutional: NAD, calm, sleeping but arousable Vitals:   09/10/16 0030 09/10/16 0100 09/10/16 0113 09/10/16 0143  BP: Marland Kitchen)  84/52 92/57  97/72  Pulse: 78 72  72  Resp: '16 14  16  '$ Temp:   98.3 F (36.8 C) 98.3 F (36.8 C)  TempSrc:   Oral Oral  SpO2: 93% 99%  97%  Weight:    114.8 kg (253 lb)  Height:    '6\' 4"'$  (1.93 m)   Eyes: PERRL, lids and conjunctivae normal ENMT: Mucous membranes are moist. Posterior pharynx clear of any exudate or lesions. Normal dentition.  Neck: normal appearance, supple Respiratory: clear to auscultation bilaterally, no wheezing, no crackles. Normal respiratory effort. No accessory muscle use.  Cardiovascular: Normal rate, regular rhythm, no murmurs / rubs / gallops. 1-2+ pitting edema in bilateral lower extremities.  2+ pedal pulses. GI: abdomen is soft and compressible.  No distention.  No tenderness.  Bowel sounds are present. Musculoskeletal:  No joint deformity in upper and lower extremities. Good ROM, no contractures. Normal muscle tone.  Skin: no rashes, warm and dry Neurologic: No focal deficits. Psychiatric: Normal judgment and insight. Alert and oriented x 3. Normal mood.     Labs on Admission: I have personally reviewed following labs and imaging studies  CBC:  Recent Labs Lab 09/09/16 1614  WBC 11.6*  HGB 12.1*  HCT 35.8*  MCV 102.0*  PLT 188*   Basic Metabolic Panel:  Recent Labs Lab 09/09/16 1614  NA 136  K 2.9*  CL 105  CO2 21*  GLUCOSE 150*  BUN 21*  CREATININE 1.32*  CALCIUM 8.9   GFR: Estimated Creatinine Clearance: 88.7  mL/min (by C-G formula based on SCr of 1.32 mg/dL (H)). Liver Function Tests:  Recent Labs Lab 09/09/16 2056  AST 47*  ALT 28  ALKPHOS 64  BILITOT 0.6  PROT 6.2*  ALBUMIN 3.0*   Urine analysis:    Component Value Date/Time   COLORURINE YELLOW 09/09/2016 2233   APPEARANCEUR CLEAR 09/09/2016 2233   LABSPEC 1.009 09/09/2016 2233   PHURINE 6.0 09/09/2016 2233   GLUCOSEU NEGATIVE 09/09/2016 2233   HGBUR SMALL (A) 09/09/2016 2233   [place] NEGATIVE 09/09/2016 2233   KETONESUR NEGATIVE 09/09/2016 2233   PROTEINUR NEGATIVE 09/09/2016 2233   NITRITE NEGATIVE 09/09/2016 2233   LEUKOCYTESUR MODERATE (A) 09/09/2016 2233   Sepsis Labs:  Lactic acid level 1.37  Radiological Exams on Admission: Ct Renal Stone Study  Result Date: 09/09/2016 CLINICAL DATA:  Flank pain mostly on the left side. EXAM: CT ABDOMEN AND PELVIS WITHOUT CONTRAST TECHNIQUE: Multidetector CT imaging of the abdomen and pelvis was performed following the standard protocol without IV contrast. COMPARISON:  08/13/2016 FINDINGS: Lower chest:  Unremarkable. Hepatobiliary: No focal abnormality in the liver on this study without intravenous contrast. There is no evidence for gallstones, gallbladder wall thickening, or pericholecystic fluid. No intrahepatic or extrahepatic biliary dilation. Pancreas: No focal mass lesion. No dilatation of the main duct. No intraparenchymal cyst. No peripancreatic edema. Spleen: No splenomegaly. No focal mass lesion. Adrenals/Urinary Tract: No adrenal nodule or mass. Multiple nonobstructing right renal stones evident. Largest stone is in the upper pole measuring 7 mm. No right ureteral stones. No secondary changes in the right kidney or ureter. Multiple left renal stones are identified including a 1.4 x 1.8 x 2.7 cm stone in the region the renal pelvis, apparently obstructing the upper pole collecting system. Imaging features are similar to prior study. No left ureteral stones. No bladder stones.  Stomach/Bowel: Stomach is nondistended. No gastric wall thickening. No evidence of outlet obstruction. Duodenum is normally positioned as is the ligament  of Treitz. No small bowel wall thickening. No small bowel dilatation. The terminal ileum is normal. The appendix is normal. No gross colonic mass. No colonic wall thickening. No substantial diverticular change. Vascular/Lymphatic: There is abdominal aortic atherosclerosis without aneurysm. There is no gastrohepatic or hepatoduodenal ligament lymphadenopathy. No intraperitoneal or retroperitoneal lymphadenopathy. No pelvic sidewall lymphadenopathy. Reproductive: The prostate gland and seminal vesicles have normal imaging features. Other: No intraperitoneal free fluid. Musculoskeletal: Metallic pin identified left femoral head. Patient is status post lumbosacral fusion. Multiple old left rib fractures evident. IMPRESSION: 1. Stable exam. Bilateral nephrolithiasis with a large dominant stone in the interpolar left kidney obstructing the upper pole collecting system. 2.  Abdominal Aortic Atherosclerois (ICD10-170.0) Electronically Signed   By: Misty Stanley M.D.   On: 09/09/2016 20:31    EKG: Independently reviewed. NSR.  Prolong QT interval.  Assessment/Plan Principal Problem:   Orthostatic hypotension Active Problems:   Hypokalemia   Chronic systolic CHF (congestive heart failure), NYHA class 2 (HCC)   Urinary retention      Orthostatic hypotension, likely overdiuresed but autonomic dysfunction, adrenal insufficiency would also be on the differential. --HOLD diuretics --HOLD coreg --Give additional NS 100cc/hr over 5 hours for a total of 1.5L NS.  Repeat orthostatic vital signs in the AM.  Hypokalemia --Replacement ordered --Empiric magnesium  Acute urinary retention with nephrolithiasis --Seen by Urology in the ED; outpatient follow-up --Leave foley catheter in place at  discharge --Cipro BID --Flomax was not started yet due to the  hypotension  Chronic systolic heart failure, compensated  DM --Hold glipizide, SSI coverage  Chronic pain --Neurontin --Tramadol   DVT prophylaxis: Low risk, outpatient status Code Status: FULL Family Communication: Patient alone at time of admission. Disposition Plan: Expect he will go home when orthostasis resolves/improves. Consults called: NONE Admission status: Place in observation with telemetry monitoring.   TIME SPENT: 50 minutes   Eber Jones MD [facility] Pager [phone]  If 7PM-7AM, please contact night-coverage [email or web address] Password [email or web address]  09/10/2016, 3:31 AM

## 2016-09-10 NOTE — Discharge Instructions (Signed)
Acute Urinary Retention, Male Acute urinary retention is when you are unable to pee (urinate). Acute urinary retention is common in older men. Prostates can get bigger, which blocks the flow of pee. Follow these instructions at home:  Drink enough fluids to keep your pee clear or pale yellow.  If you are sent home with a tube that drains the bladder (catheter), there will be a drainage bag attached to it. There are two types of bags. One is big that you can wear at night without having to empty it. One is smaller and needs to be emptied more often. ? Keep the drainage bag empty. ? Keep the drainage bag lower than your catheter.  Only take medicine as told by your doctor. Contact a doctor if:  You have a low-grade fever.  You have spasms or you are leaking pee when you have spasms. Get help right away if:  You have chills or a fever.  Your catheter stops draining pee.  Your catheter falls out.  You have increased bleeding that does not stop after you have rested and increased the amount of fluids you had been drinking. This information is not intended to replace advice given to you by your health care provider. Make sure you discuss any questions you have with your health care provider. Document Released: 01/18/2008 Document Revised: 01/07/2016 Document Reviewed: 01/10/2013 Elsevier Interactive Patient Education  2017 Elsevier Inc.  

## 2016-09-11 LAB — URINE CULTURE: Culture: NO GROWTH

## 2016-09-12 ENCOUNTER — Other Ambulatory Visit (HOSPITAL_COMMUNITY): Payer: Managed Care, Other (non HMO)

## 2016-09-14 ENCOUNTER — Other Ambulatory Visit (HOSPITAL_COMMUNITY): Payer: Managed Care, Other (non HMO)

## 2016-09-15 ENCOUNTER — Other Ambulatory Visit (HOSPITAL_COMMUNITY): Payer: Managed Care, Other (non HMO)

## 2016-09-17 ENCOUNTER — Other Ambulatory Visit (HOSPITAL_COMMUNITY): Payer: Self-pay | Admitting: Cardiology

## 2016-09-19 ENCOUNTER — Other Ambulatory Visit (HOSPITAL_COMMUNITY): Payer: Managed Care, Other (non HMO)

## 2016-09-21 ENCOUNTER — Other Ambulatory Visit (HOSPITAL_COMMUNITY): Payer: Managed Care, Other (non HMO)

## 2016-09-22 ENCOUNTER — Encounter (HOSPITAL_COMMUNITY): Payer: Self-pay

## 2016-09-22 ENCOUNTER — Ambulatory Visit (HOSPITAL_COMMUNITY)
Admission: RE | Admit: 2016-09-22 | Discharge: 2016-09-22 | Disposition: A | Discharge status: Home / Self Care | Payer: Managed Care, Other (non HMO) | Source: Ambulatory Visit | Attending: Cardiology | Admitting: Cardiology

## 2016-09-22 ENCOUNTER — Other Ambulatory Visit (HOSPITAL_COMMUNITY): Payer: Managed Care, Other (non HMO)

## 2016-09-22 ENCOUNTER — Telehealth (HOSPITAL_COMMUNITY): Payer: Self-pay | Admitting: Cardiology

## 2016-09-22 VITALS — BP 114/76 | HR 73 | Wt 251.0 lb

## 2016-09-22 DIAGNOSIS — I5032 Chronic diastolic (congestive) heart failure: Secondary | ICD-10-CM | POA: Diagnosis present

## 2016-09-22 DIAGNOSIS — I429 Cardiomyopathy, unspecified: Secondary | ICD-10-CM | POA: Diagnosis present

## 2016-09-22 DIAGNOSIS — I5022 Chronic systolic (congestive) heart failure: Secondary | ICD-10-CM

## 2016-09-22 DIAGNOSIS — Z7984 Long term (current) use of oral hypoglycemic drugs: Secondary | ICD-10-CM | POA: Diagnosis not present

## 2016-09-22 DIAGNOSIS — I878 Other specified disorders of veins: Secondary | ICD-10-CM | POA: Diagnosis not present

## 2016-09-22 DIAGNOSIS — I872 Venous insufficiency (chronic) (peripheral): Secondary | ICD-10-CM

## 2016-09-22 DIAGNOSIS — F1010 Alcohol abuse, uncomplicated: Secondary | ICD-10-CM | POA: Insufficient documentation

## 2016-09-22 DIAGNOSIS — I951 Orthostatic hypotension: Secondary | ICD-10-CM | POA: Diagnosis not present

## 2016-09-22 DIAGNOSIS — I509 Heart failure, unspecified: Secondary | ICD-10-CM

## 2016-09-22 DIAGNOSIS — Z79899 Other long term (current) drug therapy: Secondary | ICD-10-CM | POA: Diagnosis not present

## 2016-09-22 LAB — BASIC METABOLIC PANEL
Anion gap: 12 (ref 5–15)
BUN: 16 mg/dL (ref 6–20)
CHLORIDE: 110 mmol/L (ref 101–111)
CO2: 20 mmol/L — AB (ref 22–32)
Calcium: 9.6 mg/dL (ref 8.9–10.3)
Creatinine, Ser: 1.29 mg/dL — ABNORMAL HIGH (ref 0.61–1.24)
GFR calc Af Amer: >60 mL/min (ref >60)
GFR calc non Af Amer: >60 mL/min (ref >60)
GLUCOSE: 103 mg/dL — AB (ref 65–99)
Potassium: 3.1 mmol/L — ABNORMAL LOW (ref 3.5–5.1)
Sodium: 142 mmol/L (ref 135–145)

## 2016-09-22 NOTE — Telephone Encounter (Signed)
Patient aware. Voiced understanding. Repeat labs 2/15 [id number] ------

## 2016-09-22 NOTE — Patient Instructions (Signed)
Routine lab work today. Will notify you of abnormal results, otherwise no news is good news!  Follow up 2 months with Dr. McLean.  Do the following things EVERYDAY: 1) Weigh yourself in the morning before breakfast. Write it down and keep it in a log. 2) Take your medicines as prescribed 3) Eat low salt foods-Limit salt (sodium) to 2000 mg per day.  4) Stay as active as you can everyday 5) Limit all fluids for the day to less than 2 liters  

## 2016-09-22 NOTE — Addendum Note (Signed)
Encounter addended by: Graciella Freer, PA-C on: 09/22/2016  3:55 PM<BR>    Actions taken: Sign clinical note

## 2016-09-22 NOTE — Telephone Encounter (Signed)
-----   Message from Graciella Freer, PA-C sent at 09/22/2016  3:56 PM EST ----- See below.

## 2016-09-22 NOTE — Progress Notes (Addendum)
Advanced Heart Failure Clinic Note   Patient ID: Justin Bernard, male   DOB: [birth year], 56 y.o.   MRN: [id number]  PCP: Marda Stalker, PA-C with [facility] Physician Cardiology: Dr. Aundra Dubin  56 yo with history of nonischemic cardiomyopathy and recent fall with humeral fracture.    CHF was first noted in 07/2010 per patient when he developed dyspnea. He was seen at [facility]. Of note, mother had CHF diagnosis around 58 and grandfather had CHF diagnosis around 71. Pt's disease thought to be NICM with clean cath as below. 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  Initially, LVAD and transplant were considered, but EF improved greatly on medical management as echoes below demonstrate. 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 BPs were low at home over the same time period so he did cut his Coreg in half to 12.5 mg BID.  Despite this, he remained lightheaded with standing and fell, possibly with orthostatic syncope, fracturing his right humerus.  This was surgically repaired in 12/16.  Post-op, he developed volume overload and had to be diuresed. He was sent home on Coreg 6.25 mg bid, spironolactone 25 mg daily, and Lasix 40 mg daily. Echo that admission showed EF 50-55% .   He presents today for regular follow up. Last visit was orthostatic. Meds held and adjusted. Pt was orthostatic again 09/10/16 and went to ED. Improved with fluid replenishment. Weights have been inaccurate, shows he is down 15 lbs. At home he has been stable ~ 155 lbs. Has been feeling a lot better. Less fatigued. Has had no further dizziness.  Appetite and sleep both improving. No further hallucinations. Meets with a psychologist every Thursday.  Breathing has been great since ED visit. Denies any DOE. No orthopnea, lightheadedness, CP, palpitations.   CMET 09/08/16  K 3.3, Creatinine 1.78, BUN 28.  (Baseline is 0.6 - 0.8)  Labs (12/16): K 3.5, creatinine 0.82, HCT 0.82 Labs (3/17): K 3.7, creatinine 0.67  SH: He lives with his 2 nephews and son in [place].  He teaches at a university.   FH: Mother with CHF around 67, grandfather with CHF around 91.   ROS: All systems reviewed and negative except as per HPI.   PMH:  1. Chronic systolic CHF:  Nonischemic cardiomyopathy, initially followed at [facility]. [place] 08/13/10 [facility], [place]: areas or reversibility in anterior wall and lesser extent inferior wall. Dilated LV with EF 15% and severe hypokinesis. - LHC 08/31/10 [facility], [place]: Right dominant. Left main free of disease, branches into LAD and cicrumflex. Large caliber vessels with no significant stenosis. No intimal irregularities in RCA. Elevated LVEDP at 40 - ECHO 08/12/2010 [facility], [place]: EF <24% - ECHO 09/27/2010 [place], [place]: EF 15% with moderate RVE with marked dysfunction, moderate MR/TR, trace AR, mild PR, moderate LV dilation, moderate RV dilation, Severe LAE, mild RAE.  - ECHO 01/08/2012 [place], [place]: EF <25%, Grade 1 DD, MR/TR significantly improved. - ECHO 09/21/2011 [place]: EF 45-55%, Grade 1 DD, RV mildly dilated and reduced, Trace MR/PR/TR, no AR. - ECHO 03/21/2012 [place]: EF 45%, Grade 1 DD, Trace MR,PR,TR.  - ECHO 12/16 EF 50-55%, mild LVH.  - Hypotensive with low dose lisinopril. 2. Type II diabetes 3. Right humeral fracture (12/16).  4. Orthostatic hypotension.   Current Outpatient Prescriptions  Medication Sig Dispense Refill  .  carvedilol (COREG) 3.125 MG tablet Take 1 tablet (3.125 mg total) by mouth 2 (two) times daily with a meal. 826 tablet 3  . folic acid (FOLVITE) 1 MG tablet Take 1 tablet (1 mg total) by mouth daily. 30 tablet 0  . furosemide (LASIX) 40 MG tablet Take 1 tablet (40 mg total) by mouth daily. Start on 09/11/16. 30 tablet 0  . gabapentin (NEURONTIN) 600 MG  tablet Take 0.5 tablets (300 mg total) by mouth 3 (three) times daily. 45 tablet 0  . glipiZIDE (GLUCOTROL XL) 5 MG 24 hr tablet Take 5 mg by mouth daily with breakfast.     . glucose monitoring kit (FREESTYLE) monitoring kit 1 each by Does not apply route 4 (four) times daily - after meals and at bedtime. 1 month Diabetic Testing Supplies for QAC-QHS accuchecks. 1 each 1  . hydrocortisone cream 1 % Apply 1 application topically 2 (two) times daily as needed for itching.    . hydrOXYzine (ATARAX/VISTARIL) 25 MG tablet Take 1 tablet (25 mg total) by mouth 3 (three) times daily as needed for itching. 30 tablet 0  . Multiple Vitamin (MULTIVITAMIN WITH MINERALS) TABS tablet Take 1 tablet by mouth daily. 30 tablet 0  . potassium chloride SA (K-DUR,KLOR-CON) 20 MEQ tablet Take by mouth as directed. 40 meq in the AM and 20 meq in the PM    . spironolactone (ALDACTONE) 25 MG tablet Take 0.5 tablets (12.5 mg total) by mouth daily. Resume 1/28 45 tablet 3  . thiamine 100 MG tablet Take 1 tablet (100 mg total) by mouth daily. 90 tablet 1  . traMADol (ULTRAM) 50 MG tablet Take 1 tablet (50 mg total) by mouth every 6 (six) hours as needed. 30 tablet    No current facility-administered medications for this encounter.    BP 114/76 (BP Location: Left Arm, Patient Position: Sitting, Cuff Size: Normal)   Pulse 73   Wt 251 lb (113.9 kg)   SpO2 98%   BMI 30.55 kg/m    Wt Readings from Last 3 Encounters:  09/22/16 251 lb (113.9 kg)  09/10/16 253 lb (114.8 kg)  09/08/16 267 lb 12.8 oz (121.5 kg)     General: Well appearing. NAD.   Neck: JVP 6-7 cm. No thyromegaly or thyroid nodule.  Lungs: Clear, normal effort CV: Nondisplaced PMI.  Heart regular S1/S2, no S3/S4, no murmur. No carotid bruit.  Normal pedal pulses.  Abdomen: Soft, NT, ND, no HSM. No bruits or masses. +BS Skin: Intact without lesions or rashes.  Neurologic: Alert and oriented x 3.  Psych: Normal affect. Extremities: No clubbing or cyanosis.  Trace to 1+ ankle edema.  HEENT: Normal.   Assessment/Plan: 1. Chronic diastolic CHF: Low EF in past with nonischemic cardiomyopathy.  - Echo 08/18/16 LVEF 40-45%. NYHA class II symptoms.  - Volume status stable on exam.     - Continue lasix 40 daily, extra as needed.  BMET today. - Continue coreg 3.25 mg BID - Continue spironolactone 12.5 mg daily.    - Reinforced fluid restriction to < 2 L daily, sodium restriction to less than 2000 mg daily, and the importance of daily weights.    2. Orthostatic hypotension:  - Resolved.  3. ETOH abuse - Has now been abstinent per patient. Encouraged to continue abstinence.  - Continue to see Psychologist once weekly.  4. Chronic venous stasis -  Stable. Recommend compression hose.   Stable. Labs today. Will not up-titrate meds with recent orthostasis and feeling great on  current meds. Will try to gently increase at next visit. Follow up 2 months with MD. Sooner with symptoms.   [name] Friar, PA-C  09/22/2016

## 2016-09-26 ENCOUNTER — Other Ambulatory Visit (HOSPITAL_COMMUNITY): Payer: Managed Care, Other (non HMO)

## 2016-09-26 ENCOUNTER — Other Ambulatory Visit: Payer: Self-pay | Admitting: Cardiology

## 2016-09-28 ENCOUNTER — Other Ambulatory Visit (HOSPITAL_COMMUNITY): Payer: Managed Care, Other (non HMO)

## 2016-09-29 ENCOUNTER — Other Ambulatory Visit (HOSPITAL_COMMUNITY): Payer: Managed Care, Other (non HMO)

## 2016-09-29 ENCOUNTER — Inpatient Hospital Stay (HOSPITAL_COMMUNITY): Admission: RE | Admit: 2016-09-29 | Payer: Self-pay | Source: Ambulatory Visit

## 2016-10-03 ENCOUNTER — Other Ambulatory Visit (HOSPITAL_COMMUNITY): Payer: Managed Care, Other (non HMO)

## 2016-10-05 ENCOUNTER — Other Ambulatory Visit (HOSPITAL_COMMUNITY): Payer: Managed Care, Other (non HMO)

## 2016-10-06 ENCOUNTER — Other Ambulatory Visit (HOSPITAL_COMMUNITY): Payer: Managed Care, Other (non HMO)

## 2016-10-07 ENCOUNTER — Encounter: Payer: Self-pay | Admitting: Cardiology

## 2016-10-10 ENCOUNTER — Other Ambulatory Visit (HOSPITAL_COMMUNITY): Payer: Managed Care, Other (non HMO)

## 2016-10-12 ENCOUNTER — Other Ambulatory Visit (HOSPITAL_COMMUNITY): Payer: Managed Care, Other (non HMO)

## 2016-10-13 ENCOUNTER — Other Ambulatory Visit (HOSPITAL_COMMUNITY): Payer: Managed Care, Other (non HMO)

## 2016-10-17 ENCOUNTER — Other Ambulatory Visit (HOSPITAL_COMMUNITY): Payer: Managed Care, Other (non HMO)

## 2016-10-19 ENCOUNTER — Other Ambulatory Visit (HOSPITAL_COMMUNITY): Payer: Managed Care, Other (non HMO)

## 2016-10-20 ENCOUNTER — Other Ambulatory Visit (HOSPITAL_COMMUNITY): Payer: Managed Care, Other (non HMO)

## 2016-10-24 ENCOUNTER — Other Ambulatory Visit (HOSPITAL_COMMUNITY): Payer: Managed Care, Other (non HMO)

## 2016-10-26 ENCOUNTER — Other Ambulatory Visit (HOSPITAL_COMMUNITY): Payer: Managed Care, Other (non HMO)

## 2016-10-27 ENCOUNTER — Other Ambulatory Visit (HOSPITAL_COMMUNITY): Payer: Managed Care, Other (non HMO)

## 2016-10-31 ENCOUNTER — Other Ambulatory Visit (HOSPITAL_COMMUNITY): Payer: Managed Care, Other (non HMO)

## 2016-11-02 ENCOUNTER — Other Ambulatory Visit (HOSPITAL_COMMUNITY): Payer: Managed Care, Other (non HMO)

## 2016-11-03 ENCOUNTER — Other Ambulatory Visit (HOSPITAL_COMMUNITY): Payer: Managed Care, Other (non HMO)

## 2016-11-07 ENCOUNTER — Other Ambulatory Visit (HOSPITAL_COMMUNITY): Payer: Managed Care, Other (non HMO)

## 2016-11-09 ENCOUNTER — Other Ambulatory Visit (HOSPITAL_COMMUNITY): Payer: Managed Care, Other (non HMO)

## 2016-11-10 ENCOUNTER — Other Ambulatory Visit (HOSPITAL_COMMUNITY): Payer: Managed Care, Other (non HMO)

## 2016-11-14 ENCOUNTER — Other Ambulatory Visit (HOSPITAL_COMMUNITY): Payer: Managed Care, Other (non HMO)

## 2016-11-16 ENCOUNTER — Other Ambulatory Visit (HOSPITAL_COMMUNITY): Payer: Managed Care, Other (non HMO)

## 2016-11-17 ENCOUNTER — Other Ambulatory Visit (HOSPITAL_COMMUNITY): Payer: Managed Care, Other (non HMO)

## 2016-11-21 ENCOUNTER — Encounter (HOSPITAL_COMMUNITY): Payer: Self-pay

## 2016-11-21 ENCOUNTER — Other Ambulatory Visit (HOSPITAL_COMMUNITY): Payer: Managed Care, Other (non HMO)

## 2016-11-23 ENCOUNTER — Other Ambulatory Visit (HOSPITAL_COMMUNITY): Payer: Managed Care, Other (non HMO)

## 2016-12-14 ENCOUNTER — Other Ambulatory Visit: Payer: Self-pay | Admitting: Urology

## 2016-12-14 DIAGNOSIS — N200 Calculus of kidney: Secondary | ICD-10-CM

## 2016-12-22 ENCOUNTER — Telehealth (HOSPITAL_COMMUNITY): Payer: Self-pay | Admitting: *Deleted

## 2016-12-22 NOTE — Telephone Encounter (Signed)
Received fax from [facility], pt needs clearance for left percutaneous nephrolithotomy due to left kidney stone.  Per Dr Shirlee Latch "ok for procedure" note faxed back to them at [phone]

## 2016-12-28 ENCOUNTER — Other Ambulatory Visit: Payer: Self-pay | Admitting: Urology

## 2017-01-11 ENCOUNTER — Inpatient Hospital Stay (HOSPITAL_COMMUNITY): Admission: RE | Admit: 2017-01-11 | Payer: Self-pay | Source: Ambulatory Visit

## 2017-01-12 ENCOUNTER — Ambulatory Visit (INDEPENDENT_AMBULATORY_CARE_PROVIDER_SITE_OTHER): Payer: 59 | Admitting: *Deleted

## 2017-01-12 DIAGNOSIS — Z95810 Presence of automatic (implantable) cardiac defibrillator: Secondary | ICD-10-CM

## 2017-01-12 DIAGNOSIS — I5022 Chronic systolic (congestive) heart failure: Secondary | ICD-10-CM | POA: Diagnosis not present

## 2017-01-16 ENCOUNTER — Encounter: Payer: Self-pay | Admitting: Internal Medicine

## 2017-01-16 NOTE — Progress Notes (Signed)
Remote ICD transmission.   

## 2017-01-19 LAB — CUP PACEART REMOTE DEVICE CHECK
Brady Statistic RV Percent Paced: 0 %
HighPow Impedance: 49 Ohm
Implantable Lead Implant Date: 20120222
Implantable Lead Location: 753860
Implantable Lead Serial Number: 100951
Implantable Pulse Generator Implant Date: 20120222
Lead Channel Impedance Value: 413 Ohm
Lead Channel Sensing Intrinsic Amplitude: 6 mV
Lead Channel Setting Pacing Amplitude: 2.5 V
Lead Channel Setting Pacing Pulse Width: 0.5 ms
Lead Channel Setting Sensing Sensitivity: 0.6 mV
MDC IDC PG SERIAL: 100238
MDC IDC SESS DTM: 20180607103507

## 2017-01-20 ENCOUNTER — Encounter: Payer: Self-pay | Admitting: Internal Medicine

## 2017-01-20 ENCOUNTER — Encounter: Payer: Self-pay | Admitting: Cardiology

## 2017-02-13 NOTE — Patient Instructions (Signed)
Justin Bernard  02/13/2017   Your procedure is scheduled on: Monday  02-20-17  Report to [facility] Main  Entrance  Report to Radiology at  730 AM   Call this number if you have problems the morning of surgery [phone]   Remember: ONLY 1 PERSON MAY GO WITH YOU TO Radiology TO GET  READY MORNING OF YOUR SURGERY.  Do not eat food or drink liquids :After Midnight.     Take these medicines the morning of surgery with A SIP OF WATER: albuterol inhaler if needed and bring inhaler with you, carvedilol (coreg)  DO NOT TAKE ANY DIABETIC MEDICATIONS DAY OF YOUR SURGERY                               You may not have any metal on your body including hair pins and              piercings  Do not wear jewelry, make-up, lotions, powders or perfumes, deodorant             Do not wear nail polish.  Do not shave  48 hours prior to surgery.              Men may shave face and neck.   Do not bring valuables to the hospital. [facility] IS NOT             RESPONSIBLE   FOR VALUABLES.  Contacts, dentures or bridgework may not be worn into surgery.  Leave suitcase in the car. After surgery it may be brought to your room.                  Please read over the following fact sheets you were given: _____________________________________________________________________             How to Manage Your Diabetes Before and After Surgery  Why is it important to control my blood sugar before and after surgery? . Improving blood sugar levels before and after surgery helps healing and can limit problems. . A way of improving blood sugar control is eating a healthy diet by: o  Eating less sugar and carbohydrates o  Increasing activity/exercise o  Talking with your doctor about reaching your blood sugar goals . High blood sugars (greater than 180 mg/dL) can raise your risk of infections and slow your recovery, so you will need to focus on controlling your diabetes during the weeks  before surgery. . Make sure that the doctor who takes care of your diabetes knows about your planned surgery including the date and location.  How do I manage my blood sugar before surgery? . Check your blood sugar at least 4 times a day, starting 2 days before surgery, to make sure that the level is not too high or low. o Check your blood sugar the morning of your surgery when you wake up and every 2 hours until you get to the Short Stay unit. . If your blood sugar is less than 70 mg/dL, you will need to treat for low blood sugar: o Do not take insulin. o Treat a low blood sugar (less than 70 mg/dL) with  cup of clear juice (cranberry or apple), 4 glucose tablets, OR glucose gel. o Recheck blood sugar in 15 minutes after treatment (to make sure it is greater than 70 mg/dL).  If your blood sugar is not greater than 70 mg/dL on recheck, call [phone] for further instructions. . Report your blood sugar to the short stay nurse when you get to Short Stay.  . If you are admitted to the hospital after surgery: o Your blood sugar will be checked by the staff and you will probably be given insulin after surgery (instead of oral diabetes medicines) to make sure you have good blood sugar levels. o The goal for blood sugar control after surgery is 80-180 mg/dL.   WHAT DO I DO ABOUT MY DIABETES MEDICATION?  Marland Kitchen Do not take oral diabetes medicines (pills) the morning of surgery.  . THE DAY  BEFORE SURGERY, take MORNING DOSES OF GLIPIZIDE ONLY        THE DAY OF YOUR SURGERY DO NOT TAKE YOUR GLIPIZIDE.    Patient Signature:  Date:   Nurse Signature:  Date:   Reviewed and Endorsed by [facility] Patient Education Committee, August 2015[facility] - Preparing for Surgery Before surgery, you can play an important role.  Because skin is not sterile, your skin needs to be as free of germs as possible.  You can reduce the number of germs on your skin by washing with CHG (chlorahexidine gluconate) soap  before surgery.  CHG is an antiseptic cleaner which kills germs and bonds with the skin to continue killing germs even after washing. Please DO NOT use if you have an allergy to CHG or antibacterial soaps.  If your skin becomes reddened/irritated stop using the CHG and inform your nurse when you arrive at Short Stay. Do not shave (including legs and underarms) for at least 48 hours prior to the first CHG shower.  You may shave your face/neck. Please follow these instructions carefully:  1.  Shower with CHG Soap the night before surgery and the  morning of Surgery.  2.  If you choose to wash your hair, wash your hair first as usual with your  normal  shampoo.  3.  After you shampoo, rinse your hair and body thoroughly to remove the  shampoo.                           4.  Use CHG as you would any other liquid soap.  You can apply chg directly  to the skin and wash                       Gently with a scrungie or clean washcloth.  5.  Apply the CHG Soap to your body ONLY FROM THE NECK DOWN.   Do not use on face/ open                           Wound or open sores. Avoid contact with eyes, ears mouth and genitals (private parts).                       Wash face,  Genitals (private parts) with your normal soap.             6.  Wash thoroughly, paying special attention to the area where your surgery  will be performed.  7.  Thoroughly rinse your body with warm water from the neck down.  8.  DO NOT shower/wash with your normal soap after using and rinsing off  the CHG Soap.  9.  Pat yourself dry with a clean towel.            10.  Wear clean pajamas.            11.  Place clean sheets on your bed the night of your first shower and do not  sleep with pets. Day of Surgery : Do not apply any lotions/deodorants the morning of surgery.  Please wear clean clothes to the hospital/surgery center.

## 2017-02-14 ENCOUNTER — Encounter (HOSPITAL_COMMUNITY): Payer: Self-pay

## 2017-02-14 ENCOUNTER — Encounter (HOSPITAL_COMMUNITY)
Admission: RE | Admit: 2017-02-14 | Discharge: 2017-02-14 | Disposition: A | Discharge status: Home / Self Care | Payer: 59 | Source: Ambulatory Visit | Attending: Urology | Admitting: Urology

## 2017-02-14 DIAGNOSIS — Z01818 Encounter for other preprocedural examination: Secondary | ICD-10-CM | POA: Insufficient documentation

## 2017-02-14 DIAGNOSIS — N200 Calculus of kidney: Secondary | ICD-10-CM | POA: Insufficient documentation

## 2017-02-14 HISTORY — DX: Unspecified cataract: H26.9

## 2017-02-14 LAB — BASIC METABOLIC PANEL
Anion gap: 5 (ref 5–15)
BUN: 19 mg/dL (ref 6–20)
CALCIUM: 8.9 mg/dL (ref 8.9–10.3)
CO2: 21 mmol/L — ABNORMAL LOW (ref 22–32)
CREATININE: 0.91 mg/dL (ref 0.61–1.24)
Chloride: 117 mmol/L — ABNORMAL HIGH (ref 101–111)
GFR calc Af Amer: >60 mL/min (ref >60)
GLUCOSE: 99 mg/dL (ref 65–99)
Potassium: 3.8 mmol/L (ref 3.5–5.1)
Sodium: 143 mmol/L (ref 135–145)

## 2017-02-14 LAB — GLUCOSE, CAPILLARY: GLUCOSE-CAPILLARY: 103 mg/dL — AB (ref 65–99)

## 2017-02-14 LAB — CBC
HCT: 35.2 % — ABNORMAL LOW (ref 39.0–52.0)
Hemoglobin: 11.5 g/dL — ABNORMAL LOW (ref 13.0–17.0)
MCH: 29.5 pg (ref 26.0–34.0)
MCHC: 32.7 g/dL (ref 30.0–36.0)
MCV: 90.3 fL (ref 78.0–100.0)
PLATELETS: 307 10*3/uL (ref 150–400)
RBC: 3.9 MIL/uL — ABNORMAL LOW (ref 4.22–5.81)
RDW: 14.5 % (ref 11.5–15.5)
WBC: 8.7 10*3/uL (ref 4.0–10.5)

## 2017-02-14 LAB — PROTIME-INR
INR: 1.05
PROTHROMBIN TIME: 13.7 s (ref 11.4–15.2)

## 2017-02-14 LAB — APTT: APTT: 36 s (ref 24–36)

## 2017-02-15 LAB — HEMOGLOBIN A1C
HEMOGLOBIN A1C: 4.9 % (ref 4.8–5.6)
MEAN PLASMA GLUCOSE: 94 mg/dL

## 2017-02-16 NOTE — Progress Notes (Signed)
Cardiology Office Note Date:  02/17/2017  Patient ID:  Justin Bernard, DOB [birth year], MRN [id number] PCP:  Marda Stalker, PA-C  Cardiologist:  Dr. Aundra Dubin Electrophysiologist: Dr. Lovena Le   Chief Complaint: routine EP visit  History of Present Illness: Justin Bernard is a 56 y.o. 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  Record mentions ETOH abuse and hallucinations seeing pshychologist weekly with stabalization.  He has had difficulties with orthostasis requiring titration of her medicines early in the year.  He comes in today be seen for Dr. Lovena Le, last seen by him Dec 2016, though seems he has been following with CHF team routinely and doing his remote checks.  He was seen earlier this AM by CHF team, he is pending what sounds like cystoscopy for renal stone removal, was cleared by Dr. Aundra Dubin.  He tells me they urology is aware he has an ICD, but will "triple check" .  He feels like he is doing well.  Weight has been stable, no symptoms of PND, orthopnea, no SOB, minimal DOE at times.  No CP, palpitations, no dizziness, near syncope or syncope.   Device informations: BSCi single chamber ICD, implanted 2012, Dr. Lovena Le   Past Medical History:  Diagnosis Date  . AICD (automatic cardioverter/defibrillator) present   . Arthritis    L hip   . Asthma    pt. uses albuterol inhaler in the spring for enviromental allergies   . Cataract    LEFT EYE  . CHF (congestive heart failure) ([place])   . Chronic systolic heart failure (HCC)    NICM, previous heart transplant candidate with EF 10%  . Diabetes mellitus without complication ([place])    TYPE 2  . Family history  of adverse reaction to anesthesia    sister- headache & N&V  . Foley catheter in place   . History of hiatal hernia   . Hypertension   . Kidney stones 1997   passed spontaneously   . Neuromuscular disorder ([place])    essential tremor  . Pneumonia 2013   hosp. - IV antibiotics   . Presence of permanent cardiac pacemaker 2012   [employer]  . Shortness of breath dyspnea    WITH EXERTION    Past Surgical History:  Procedure Laterality Date  . BACK SURGERY  07/2000   fusion , [facility]  . BONE EXCISION Right 04/29/2016   Procedure: EXCISION HETEROTOPIC BONE RIGHT HUMERUS;  Surgeon: Altamese Industry, MD;  Location: [facility];  Service: Orthopedics;  Laterality: Right;  . CARDIAC CATHETERIZATION  08/2012  . FRACTURE SURGERY Left    hip , r shoulder   . HARDWARE REMOVAL Right 04/29/2016   Procedure: HARDWARE REMOVAL;  Surgeon: Altamese Fall Branch, MD;  Location: [facility];  Service: Orthopedics;  Laterality: Right;  . HERNIA REPAIR Right    inguinal   . HIP SURGERY Left YRS AGO   hardware present, for slipped joint   . ICD  2012   [employer]  . ORIF HUMERUS FRACTURE Right 07/20/2015   Procedure: OPEN REDUCTION INTERNAL FIXATION (ORIF) PROXIMAL HUMERUS FRACTURE;  Surgeon: Netta Cedars, MD;  Location: [facility];  Service: Orthopedics;  Laterality: Right;  . TONSILLECTOMY  AS CHILD  . ulnar  surgery Right     Current Outpatient Prescriptions  Medication Sig Dispense Refill  . albuterol (PROVENTIL HFA;VENTOLIN HFA) 108 (90 Base) MCG/ACT inhaler Inhale 2 puffs into the lungs every 6 (six) hours as needed for wheezing or shortness of breath.    . carvedilol (COREG) 6.25 MG tablet Take 1 tablet (6.25 mg total) by mouth 2 (two) times daily with a meal. 180 tablet 3  . furosemide (LASIX) 40 MG tablet Take 1 tablet (40 mg total) by mouth daily. Start on 09/11/16. 30 tablet 0  . glipiZIDE (GLUCOTROL XL) 5 MG 24 hr tablet Take 5 mg by mouth daily with breakfast.     . glucose monitoring kit  (FREESTYLE) monitoring kit 1 each by Does not apply route 4 (four) times daily - after meals and at bedtime. 1 month Diabetic Testing Supplies for QAC-QHS accuchecks. 1 each 1  . hydrocortisone cream 1 % Apply 1 application topically 2 (two) times daily as needed for itching.    . hydrOXYzine (ATARAX/VISTARIL) 25 MG tablet Take 1 tablet (25 mg total) by mouth 3 (three) times daily as needed for itching. 30 tablet 0  . ibuprofen (ADVIL,MOTRIN) 100 MG/5ML suspension Take 400-600 mg by mouth every 4 (four) hours as needed for mild pain (depends on pain if 2-3 tablets).    . Multiple Vitamin (MULTIVITAMIN WITH MINERALS) TABS tablet Take 1 tablet by mouth daily. 30 tablet 0  . potassium chloride SA (K-DUR,KLOR-CON) 20 MEQ tablet Take 20 mEq by mouth daily.     Marland Kitchen spironolactone (ALDACTONE) 25 MG tablet Take 25 mg by mouth daily.    Marland Kitchen thiamine 100 MG tablet Take 1 tablet (100 mg total) by mouth daily. 90 tablet 1  . traMADol (ULTRAM) 50 MG tablet Take 1 tablet (50 mg total) by mouth every 6 (six) hours as needed. 30 tablet    No current facility-administered medications for this visit.     Allergies:   Penicillins   Social History:  The patient  reports that he has never smoked. He has never used smokeless tobacco. He reports that he does not drink alcohol or use drugs.   Family History:  The patient's family history includes CAD in his father and other; Heart failure in his mother; Hypertension in his mother and other.  ROS:  Please see the history of present illness.  All other systems are reviewed and otherwise negative.   PHYSICAL EXAM:  VS:  BP 116/72   Pulse 78   Ht '6\' 4"'$  (1.93 m)   Wt 220 lb (99.8 kg)   BMI 26.78 kg/m  BMI: Body mass index is 26.78 kg/m. Well nourished, well developed, in no acute distress  HEENT: normocephalic, atraumatic  Neck: no JVD, carotid bruits or masses Cardiac: RRR; no significant murmurs, no rubs, or gallops Lungs:  CTA b/l, no wheezing, rhonchi or rales    Abd: soft, nontender MS: no deformity or atrophy Ext: no edema  Skin: warm and dry, no rash Neuro:  No gross deficits appreciated Psych: euthymic mood, full affect  ICD site is stable, no tethering or discomfort   EKG:  09/10/16 SR 72bpm ICD interrogation done today by industry and reviewed by myself: battery and lead measurements are stable, 2 NSVT since his last transmission in May  ECHO 12/16 EF 50-55%, mild LVH.  ECHO 09/27/2010 [place]: EF 15% with moderate RVE with marked dysfunction, moderate MR/TR, trace AR, mild PR, moderate LV dilation, moderate RV dilation, Severe LAE, mild RAE LHC 08/31/10  [place], [place]: Right dominant. Left main free of disease, branches into LAD and cicrumflex. Large caliber vessels with no significant stenosis. No intimal irregularities in RCA. Elevated LVEDP at 40  Recent Labs: 09/08/2016: B Natriuretic Peptide 236.4 09/09/2016: ALT 28 09/10/2016: Magnesium 1.9 02/14/2017: BUN 19; Creatinine, Ser 0.91; Hemoglobin 11.5; Platelets 307; Potassium 3.8; Sodium 143  No results found for requested labs within last 8760 hours.   Estimated Creatinine Clearance: 113.9 mL/min (by C-G formula based on SCr of 0.91 mg/dL).   Wt Readings from Last 3 Encounters:  02/17/17 220 lb (99.8 kg)  02/17/17 220 lb (99.8 kg)  02/14/17 220 lb (99.8 kg)     Other studies reviewed: Additional studies/records reviewed today include: summarized above  ASSESSMENT AND PLAN:  1. ICD     Stable device function, no changes made  2. NICM     Exam appears stable, weight is stable     C/w Dr. Aundra Dubin  3. Orthostatic tendency     This is much improved with current regime   Disposition: F/u with Q 3 month remote device checks, 1 year with EP, Dr. Lovena Le, sooner if needed  Current medicines are reviewed at length with the patient today.  The patient did not have any concerns regarding medicines.  Haywood Lasso, PA-C 02/17/2017 1:09 PM     [address] [address] [phone] (office)  [phone] (fax)

## 2017-02-17 ENCOUNTER — Encounter (INDEPENDENT_AMBULATORY_CARE_PROVIDER_SITE_OTHER): Payer: Self-pay

## 2017-02-17 ENCOUNTER — Other Ambulatory Visit: Payer: Self-pay | Admitting: Student

## 2017-02-17 ENCOUNTER — Ambulatory Visit (INDEPENDENT_AMBULATORY_CARE_PROVIDER_SITE_OTHER): Payer: 59 | Admitting: Physician Assistant

## 2017-02-17 ENCOUNTER — Ambulatory Visit (HOSPITAL_COMMUNITY)
Admission: RE | Admit: 2017-02-17 | Discharge: 2017-02-17 | Disposition: A | Discharge status: Home / Self Care | Payer: 59 | Source: Ambulatory Visit | Attending: Cardiology | Admitting: Cardiology

## 2017-02-17 ENCOUNTER — Encounter (HOSPITAL_COMMUNITY): Payer: Self-pay

## 2017-02-17 VITALS — BP 116/72 | HR 78 | Ht 76.0 in | Wt 220.0 lb

## 2017-02-17 VITALS — BP 126/78 | HR 80 | Wt 220.0 lb

## 2017-02-17 DIAGNOSIS — I872 Venous insufficiency (chronic) (peripheral): Secondary | ICD-10-CM | POA: Diagnosis not present

## 2017-02-17 DIAGNOSIS — Z79899 Other long term (current) drug therapy: Secondary | ICD-10-CM | POA: Diagnosis not present

## 2017-02-17 DIAGNOSIS — I429 Cardiomyopathy, unspecified: Secondary | ICD-10-CM | POA: Diagnosis not present

## 2017-02-17 DIAGNOSIS — Z95810 Presence of automatic (implantable) cardiac defibrillator: Secondary | ICD-10-CM | POA: Insufficient documentation

## 2017-02-17 DIAGNOSIS — I5042 Chronic combined systolic (congestive) and diastolic (congestive) heart failure: Secondary | ICD-10-CM | POA: Insufficient documentation

## 2017-02-17 DIAGNOSIS — F1010 Alcohol abuse, uncomplicated: Secondary | ICD-10-CM | POA: Diagnosis not present

## 2017-02-17 DIAGNOSIS — E119 Type 2 diabetes mellitus without complications: Secondary | ICD-10-CM | POA: Diagnosis not present

## 2017-02-17 DIAGNOSIS — I5022 Chronic systolic (congestive) heart failure: Secondary | ICD-10-CM | POA: Diagnosis not present

## 2017-02-17 DIAGNOSIS — Z7984 Long term (current) use of oral hypoglycemic drugs: Secondary | ICD-10-CM | POA: Diagnosis not present

## 2017-02-17 DIAGNOSIS — N200 Calculus of kidney: Secondary | ICD-10-CM | POA: Insufficient documentation

## 2017-02-17 DIAGNOSIS — I951 Orthostatic hypotension: Secondary | ICD-10-CM | POA: Insufficient documentation

## 2017-02-17 LAB — CUP PACEART INCLINIC DEVICE CHECK
Date Time Interrogation Session: 20180706131557
HighPow Impedance: 54 Ohm
Implantable Pulse Generator Implant Date: 20120222
Lead Channel Impedance Value: 450 Ohm
Lead Channel Setting Pacing Amplitude: 2.5 V
Lead Channel Setting Pacing Pulse Width: 0.5 ms
Lead Channel Setting Sensing Sensitivity: 0.6 mV
MDC IDC LEAD IMPLANT DT: 20120222
MDC IDC LEAD LOCATION: 753860
MDC IDC LEAD SERIAL: 100951
MDC IDC MSMT BATTERY REMAINING LONGEVITY: 84 mo
MDC IDC MSMT LEADCHNL RV PACING THRESHOLD AMPLITUDE: 1.1 V
MDC IDC MSMT LEADCHNL RV PACING THRESHOLD PULSEWIDTH: 0.5 ms
MDC IDC MSMT LEADCHNL RV SENSING INTR AMPL: 6.5 mV
Pulse Gen Serial Number: 100238

## 2017-02-17 MED ORDER — CARVEDILOL 6.25 MG PO TABS: 6.2500 mg | ORAL_TABLET | Freq: Two times a day (BID) | ORAL | 3 refills | Status: DC

## 2017-02-17 NOTE — Patient Instructions (Signed)
Increase Carvedilol to 6.25mg  twice daily.  Follow up with Dr.McLean in 2 months

## 2017-02-17 NOTE — Progress Notes (Signed)
Advanced Heart Failure Clinic Note   Patient ID: Justin Bernard, male   DOB: Jul 10, 1961, 56 y.o.   MRN: [id number]  PCP: Marda Stalker, PA-C with [facility] Physician Cardiology: Dr. Aundra Dubin  56 yo with history of nonischemic cardiomyopathy and recent fall with humeral fracture.    CHF was first noted in 07/2010 per patient when he developed dyspnea. He was seen at [facility]. Of note, mother had CHF diagnosis around 30 and grandfather had CHF diagnosis around 32. Pt's disease thought to be NICM with clean cath as below. 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  Initially, LVAD and transplant were considered, but EF improved greatly on medical management as echoes below demonstrate. 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 Feeling well, says he has more energy than he has ever had. His weight is down 30 pounds since the last time he was here in Feb. He is exercising more, walking daily. Weights at home are 220-223 pounds. Denies SOB with exercise, no SOB at rest. Denies orthopnea and PND. Eating low sodium foods, drinking less than 2L a day. Needs to have a kidney stone removed on Monday. Our office has sent a letter to his urologist on 12/22/16 stating that Dr. Aundra Dubin felt that he was stable from a cardiac standpoint for surgery.   CMET 09/08/16 K 3.3, Creatinine 1.78, BUN 28.  (Baseline is 0.6 - 0.8)  Labs (12/16): K 3.5, creatinine 0.82, HCT 0.82 Labs (3/17): K 3.7, creatinine 0.67 Labs 02/2017: K 3.8, creatinine 0.91  SH: He lives with his 2 nephews and son in [place].  He teaches at a university.   FH: Mother with CHF around 90, grandfather with CHF around 15.   ROS: All systems reviewed and negative except as per HPI.   PMH:  1. Chronic systolic CHF:  Nonischemic cardiomyopathy, initially followed at [facility]. [place]  08/13/10 [facility], [place]: areas or reversibility in anterior wall and lesser extent inferior wall. Dilated LV with EF 15% and severe hypokinesis. - LHC 08/31/10 [facility], [place]: Right dominant. Left main free of disease, branches into LAD and cicrumflex. Large caliber vessels with no significant stenosis. No intimal irregularities in RCA. Elevated LVEDP at 40 - ECHO 08/12/2010 [facility], [place]: EF <24% - ECHO 09/27/2010 [place]: EF 15% with moderate RVE with marked dysfunction, moderate MR/TR, trace AR, mild PR, moderate LV dilation, moderate RV dilation, Severe LAE, mild RAE.  - ECHO 01/08/2012 [place]: EF <25%, Grade 1 DD, MR/TR significantly improved. - ECHO 09/21/2011 [place]: EF 45-55%, Grade 1 DD, RV mildly dilated and reduced, Trace MR/PR/TR, no AR. - ECHO 03/21/2012 [place]: EF 45%, Grade 1 DD, Trace MR,PR,TR.  - ECHO 12/16 EF 50-55%, mild LVH.  - Hypotensive with low dose lisinopril. 2. Type II diabetes 3. Right humeral fracture (12/16).  4. Orthostatic hypotension.   Current Outpatient Prescriptions  Medication Sig Dispense Refill  . albuterol (PROVENTIL HFA;VENTOLIN HFA) 108 (90 Base) MCG/ACT inhaler Inhale 2 puffs into the lungs every 6 (six) hours as needed for wheezing or shortness of breath.    . carvedilol (COREG) 3.125 MG tablet Take 1 tablet (3.125 mg total) by mouth 2 (two) times daily with a meal. 180 tablet 3  . furosemide (LASIX) 40 MG tablet Take 1 tablet (40 mg total) by mouth daily. Start on 09/11/16. 30 tablet 0  .  glipiZIDE (GLUCOTROL XL) 5 MG 24 hr tablet Take 5 mg by mouth daily with breakfast.     . glucose monitoring kit (FREESTYLE) monitoring kit 1 each by Does not apply route 4 (four) times daily - after meals and at bedtime. 1 month Diabetic Testing Supplies for QAC-QHS accuchecks. 1 each 1  . hydrocortisone cream 1 % Apply 1 application topically 2 (two) times daily as needed for itching.    . Multiple Vitamin (MULTIVITAMIN WITH MINERALS) TABS  tablet Take 1 tablet by mouth daily. 30 tablet 0  . potassium chloride SA (K-DUR,KLOR-CON) 20 MEQ tablet Take 20 mEq by mouth daily.     Marland Kitchen spironolactone (ALDACTONE) 25 MG tablet Take 25 mg by mouth daily.    Marland Kitchen thiamine 100 MG tablet Take 1 tablet (100 mg total) by mouth daily. 90 tablet 1  . traMADol (ULTRAM) 50 MG tablet Take 1 tablet (50 mg total) by mouth every 6 (six) hours as needed. 30 tablet   . gabapentin (NEURONTIN) 600 MG tablet Take 0.5 tablets (300 mg total) by mouth 3 (three) times daily. (Patient not taking: Reported on 02/13/2017) 45 tablet 0  . hydrOXYzine (ATARAX/VISTARIL) 25 MG tablet Take 1 tablet (25 mg total) by mouth 3 (three) times daily as needed for itching. (Patient not taking: Reported on 02/13/2017) 30 tablet 0  . ibuprofen (ADVIL,MOTRIN) 100 MG/5ML suspension Take 400-600 mg by mouth every 4 (four) hours as needed for mild pain (depends on pain if 2-3 tablets).     No current facility-administered medications for this encounter.    BP 126/78   Pulse 80   Wt 220 lb (99.8 kg)   SpO2 99%   BMI 26.78 kg/m    Wt Readings from Last 3 Encounters:  02/17/17 220 lb (99.8 kg)  02/14/17 220 lb (99.8 kg)  09/22/16 251 lb (113.9 kg)     General: Well appearing male, NAD.  Neck: Supple, JVP flat. No thyromegaly or thyroid nodule.  Lungs: Clear bilaterally. Normal effort.  CV: Nondisplaced PMI. Heart rate regular, No M/R/G.  No carotid bruit.  Normal pedal pulses.  Abdomen: Soft, nontender, nondistended. no HSM. No bruits or masses. +BS Skin: Intact without lesions or rashes.  Neurologic: A&O x3.  Psych: Normal affect. Extremities: No clubbing or cyanosis. Trace pedal edema bilaterally.  HEENT: Normal.   Assessment/Plan: 1. Chronic combined systolic and diastolic CHF: NICM, Echo 11/2681 EF 40-45%. - NYHA I  - Volume status stable on exam.     - Continue lasix 40 mg daily.  - HR is 80. Increase Coreg to 6.25 mg BID. He has a history of orthostatic hypotension. He will  take his blood pressures at home, if low or he experiences dizziness he will call our office and stop Coreg.  - Continue spironolactone 12.5 mg daily.    - Recent BMET stable.     2. Orthostatic hypotension:  - Resolved 3. ETOH abuse - Encouraged continued abstinence.  - Sees Psychiatry.  4. Chronic venous stasis -  Stable.  5. Surgical clearance - Ok per Dr. Aundra Dubin to have surgery on Monday to remove kidney stone.   Follow up with Dr. Aundra Dubin in 2 months.   Arbutus Leas, NP  02/17/2017

## 2017-02-17 NOTE — Progress Notes (Signed)
DEVICE CHECK NOTE [employer] ICD 02-17-17 EPIC, CHF CARDIAC CLEARANCE NOTE DR MCLEAN 02-17-17 EPIC, LOV CARDIOLOGY RENEE URSAY 02-17-17 EPIC

## 2017-02-17 NOTE — Patient Instructions (Signed)
Medication Instructions:   Your physician recommends that you continue on your current medications as directed. Please refer to the Current Medication list given to you today.   If you need a refill on your cardiac medications before your next appointment, please call your pharmacy.  Labwork: NONE ORDERED  TODAY    Testing/Procedures: NONE ORDERED  TODAY    Follow-Up: Your physician wants you to follow-up in: ONE YEAR WITH Ladona Ridgel  You will receive a reminder letter in the mail two months in advance. If you don't receive a letter, please call our office to schedule the follow-up appointment.  Remote monitoring is used to monitor your Pacemaker of ICD from home. This monitoring reduces the number of office visits required to check your device to one time per year. It allows [place] to keep an eye on the functioning of your device to ensure it is working properly. You are scheduled for a device check from home on  04-13-2017 You may send your transmission at any time that day. If you have a wireless device, the transmission will be sent automatically. After your physician reviews your transmission, you will receive a postcard with your next transmission date.    Any Other Special Instructions Will Be Listed Below (If Applicable).

## 2017-02-20 ENCOUNTER — Ambulatory Visit (HOSPITAL_COMMUNITY)
Admission: RE | Admit: 2017-02-20 | Discharge: 2017-02-20 | Disposition: A | Discharge status: Home / Self Care | Payer: 59 | Source: Ambulatory Visit | Attending: Urology | Admitting: Urology

## 2017-02-20 ENCOUNTER — Inpatient Hospital Stay (HOSPITAL_COMMUNITY): Payer: 59

## 2017-02-20 ENCOUNTER — Observation Stay (HOSPITAL_COMMUNITY)
Admission: RE | Admit: 2017-02-20 | Discharge: 2017-02-21 | Disposition: A | Discharge status: Home / Self Care | Payer: 59 | Source: Ambulatory Visit | Attending: Urology | Admitting: Urology

## 2017-02-20 ENCOUNTER — Encounter (HOSPITAL_COMMUNITY): Payer: Self-pay | Admitting: *Deleted

## 2017-02-20 ENCOUNTER — Inpatient Hospital Stay (HOSPITAL_COMMUNITY): Payer: 59 | Admitting: Certified Registered Nurse Anesthetist

## 2017-02-20 ENCOUNTER — Encounter (HOSPITAL_COMMUNITY)
Admission: RE | Disposition: A | Discharge status: Home / Self Care | Payer: Self-pay | Source: Ambulatory Visit | Attending: Urology

## 2017-02-20 DIAGNOSIS — E119 Type 2 diabetes mellitus without complications: Secondary | ICD-10-CM | POA: Diagnosis not present

## 2017-02-20 DIAGNOSIS — I10 Essential (primary) hypertension: Secondary | ICD-10-CM | POA: Diagnosis not present

## 2017-02-20 DIAGNOSIS — J45909 Unspecified asthma, uncomplicated: Secondary | ICD-10-CM | POA: Diagnosis not present

## 2017-02-20 DIAGNOSIS — Z79899 Other long term (current) drug therapy: Secondary | ICD-10-CM | POA: Diagnosis not present

## 2017-02-20 DIAGNOSIS — N200 Calculus of kidney: Principal | ICD-10-CM

## 2017-02-20 DIAGNOSIS — Z7984 Long term (current) use of oral hypoglycemic drugs: Secondary | ICD-10-CM | POA: Diagnosis not present

## 2017-02-20 DIAGNOSIS — Z880 Allergy status to penicillin: Secondary | ICD-10-CM | POA: Diagnosis not present

## 2017-02-20 HISTORY — PX: IR URETERAL STENT LEFT NEW ACCESS W/O SEP NEPHROSTOMY CATH: IMG6075

## 2017-02-20 HISTORY — PX: NEPHROLITHOTOMY: SHX5134

## 2017-02-20 LAB — BASIC METABOLIC PANEL
Anion gap: 3 — ABNORMAL LOW (ref 5–15)
BUN: 18 mg/dL (ref 6–20)
CALCIUM: 8.4 mg/dL — AB (ref 8.9–10.3)
CO2: 23 mmol/L (ref 22–32)
CREATININE: 0.89 mg/dL (ref 0.61–1.24)
Chloride: 114 mmol/L — ABNORMAL HIGH (ref 101–111)
Glucose, Bld: 93 mg/dL (ref 65–99)
Potassium: 4.1 mmol/L (ref 3.5–5.1)
SODIUM: 140 mmol/L (ref 135–145)

## 2017-02-20 LAB — CBC
HCT: 34.6 % — ABNORMAL LOW (ref 39.0–52.0)
HEMOGLOBIN: 11.2 g/dL — AB (ref 13.0–17.0)
MCH: 29.2 pg (ref 26.0–34.0)
MCHC: 32.4 g/dL (ref 30.0–36.0)
MCV: 90.3 fL (ref 78.0–100.0)
PLATELETS: 275 10*3/uL (ref 150–400)
RBC: 3.83 MIL/uL — AB (ref 4.22–5.81)
RDW: 14 % (ref 11.5–15.5)
WBC: 7.7 10*3/uL (ref 4.0–10.5)

## 2017-02-20 LAB — PROTIME-INR
INR: 1.05
PROTHROMBIN TIME: 13.8 s (ref 11.4–15.2)

## 2017-02-20 LAB — APTT: aPTT: 35 seconds (ref 24–36)

## 2017-02-20 LAB — GLUCOSE, CAPILLARY
GLUCOSE-CAPILLARY: 93 mg/dL (ref 65–99)
GLUCOSE-CAPILLARY: 73 mg/dL (ref 65–99)
Glucose-Capillary: 75 mg/dL (ref 65–99)
Glucose-Capillary: 88 mg/dL (ref 65–99)

## 2017-02-20 LAB — HEMOGLOBIN AND HEMATOCRIT, BLOOD
HCT: 36.2 % — ABNORMAL LOW (ref 39.0–52.0)
HEMOGLOBIN: 11.6 g/dL — AB (ref 13.0–17.0)

## 2017-02-20 SURGERY — NEPHROLITHOTOMY PERCUTANEOUS
Anesthesia: General | Laterality: Left

## 2017-02-20 MED ORDER — MIDAZOLAM HCL 5 MG/5ML IJ SOLN
INTRAMUSCULAR | Status: DC | PRN
  Administered 2017-02-20: 1 mg via INTRAVENOUS
  Administered 2017-02-20: 2 mg via INTRAVENOUS

## 2017-02-20 MED ORDER — 0.9 % SODIUM CHLORIDE (POUR BTL) OPTIME
TOPICAL | Status: DC | PRN
  Administered 2017-02-20: 1000 mL

## 2017-02-20 MED ORDER — FENTANYL CITRATE (PF) 100 MCG/2ML IJ SOLN
INTRAMUSCULAR | Status: AC
  Filled 2017-02-20: qty 2

## 2017-02-20 MED ORDER — SODIUM CHLORIDE 0.9 % IR SOLN
Status: DC | PRN
  Administered 2017-02-20: 6000 mL

## 2017-02-20 MED ORDER — FUROSEMIDE 40 MG PO TABS
40.0000 mg | ORAL_TABLET | Freq: Every day | ORAL | Status: DC
  Administered 2017-02-21: 40 mg via ORAL
  Filled 2017-02-20: qty 1

## 2017-02-20 MED ORDER — SUCCINYLCHOLINE CHLORIDE 200 MG/10ML IV SOSY
PREFILLED_SYRINGE | INTRAVENOUS | Status: AC
  Filled 2017-02-20: qty 10

## 2017-02-20 MED ORDER — MORPHINE SULFATE (PF) 4 MG/ML IV SOLN: 2.0000 mg | INTRAVENOUS | Status: DC | PRN

## 2017-02-20 MED ORDER — ROCURONIUM BROMIDE 50 MG/5ML IV SOSY
PREFILLED_SYRINGE | INTRAVENOUS | Status: AC
Start: 2017-02-20 — End: 2017-02-20
  Filled 2017-02-20: qty 5

## 2017-02-20 MED ORDER — IOPAMIDOL (ISOVUE-300) INJECTION 61%
INTRAVENOUS | Status: AC
  Administered 2017-02-20: 20 mL
  Filled 2017-02-20: qty 50

## 2017-02-20 MED ORDER — PHENYLEPHRINE HCL 10 MG/ML IJ SOLN
INTRAMUSCULAR | Status: DC | PRN
  Administered 2017-02-20 (×4): 80 ug via INTRAVENOUS

## 2017-02-20 MED ORDER — SODIUM CHLORIDE 0.9 % IV SOLN
INTRAVENOUS | Status: DC
  Administered 2017-02-20 – 2017-02-21 (×3): via INTRAVENOUS

## 2017-02-20 MED ORDER — SODIUM CHLORIDE 0.9 % IV BOLUS (SEPSIS)
500.0000 mL | Freq: Once | INTRAVENOUS | Status: AC
  Administered 2017-02-20: 500 mL via INTRAVENOUS

## 2017-02-20 MED ORDER — MIDAZOLAM HCL 2 MG/2ML IJ SOLN
INTRAMUSCULAR | Status: AC
  Filled 2017-02-20: qty 2

## 2017-02-20 MED ORDER — ONDANSETRON HCL 4 MG/2ML IJ SOLN
INTRAMUSCULAR | Status: AC
  Filled 2017-02-20: qty 2

## 2017-02-20 MED ORDER — ACETAMINOPHEN 500 MG PO TABS
1000.0000 mg | ORAL_TABLET | Freq: Four times a day (QID) | ORAL | Status: DC
  Administered 2017-02-20 – 2017-02-21 (×2): 1000 mg via ORAL
  Filled 2017-02-20 (×2): qty 2

## 2017-02-20 MED ORDER — TRAMADOL HCL 50 MG PO TABS: 50.0000 mg | ORAL_TABLET | Freq: Four times a day (QID) | ORAL | Status: DC | PRN

## 2017-02-20 MED ORDER — PHENYLEPHRINE HCL 10 MG/ML IJ SOLN
INTRAMUSCULAR | Status: AC
  Filled 2017-02-20: qty 2

## 2017-02-20 MED ORDER — PHENYLEPHRINE 40 MCG/ML (10ML) SYRINGE FOR IV PUSH (FOR BLOOD PRESSURE SUPPORT)
PREFILLED_SYRINGE | INTRAVENOUS | Status: AC
  Filled 2017-02-20: qty 10

## 2017-02-20 MED ORDER — CIPROFLOXACIN IN D5W 400 MG/200ML IV SOLN
INTRAVENOUS | Status: AC
  Filled 2017-02-20: qty 200

## 2017-02-20 MED ORDER — HYDROXYZINE HCL 25 MG PO TABS: 25.0000 mg | ORAL_TABLET | Freq: Three times a day (TID) | ORAL | Status: DC | PRN

## 2017-02-20 MED ORDER — MIDAZOLAM HCL 2 MG/2ML IJ SOLN
INTRAMUSCULAR | Status: DC | PRN
  Administered 2017-02-20: 1 mg via INTRAVENOUS
  Administered 2017-02-20: 0.5 mg via INTRAVENOUS
  Administered 2017-02-20 (×2): 1 mg via INTRAVENOUS
  Administered 2017-02-20: 0.5 mg via INTRAVENOUS

## 2017-02-20 MED ORDER — SUGAMMADEX SODIUM 500 MG/5ML IV SOLN
INTRAVENOUS | Status: AC
  Filled 2017-02-20: qty 5

## 2017-02-20 MED ORDER — LIDOCAINE HCL 1 % IJ SOLN
INTRAMUSCULAR | Status: DC | PRN
  Administered 2017-02-20: 20 mL

## 2017-02-20 MED ORDER — SENNOSIDES-DOCUSATE SODIUM 8.6-50 MG PO TABS
2.0000 | ORAL_TABLET | Freq: Every day | ORAL | Status: DC
  Administered 2017-02-20: 2 via ORAL
  Filled 2017-02-20: qty 2

## 2017-02-20 MED ORDER — POTASSIUM CHLORIDE CRYS ER 20 MEQ PO TBCR
20.0000 meq | EXTENDED_RELEASE_TABLET | Freq: Every day | ORAL | Status: DC
  Administered 2017-02-21: 20 meq via ORAL
  Filled 2017-02-20: qty 1

## 2017-02-20 MED ORDER — CIPROFLOXACIN IN D5W 400 MG/200ML IV SOLN
400.0000 mg | Freq: Once | INTRAVENOUS | Status: AC
  Administered 2017-02-20: 400 mg via INTRAVENOUS

## 2017-02-20 MED ORDER — BELLADONNA ALKALOIDS-OPIUM 16.2-60 MG RE SUPP
1.0000 | Freq: Four times a day (QID) | RECTAL | Status: DC | PRN
Start: 2017-02-20 — End: 2017-02-21

## 2017-02-20 MED ORDER — EPHEDRINE 5 MG/ML INJ
INTRAVENOUS | Status: AC
  Filled 2017-02-20: qty 10

## 2017-02-20 MED ORDER — LACTATED RINGERS IV SOLN
INTRAVENOUS | Status: DC
  Administered 2017-02-20: 12:00:00 via INTRAVENOUS

## 2017-02-20 MED ORDER — SUGAMMADEX SODIUM 200 MG/2ML IV SOLN
INTRAVENOUS | Status: AC
  Filled 2017-02-20: qty 2

## 2017-02-20 MED ORDER — FOLIC ACID 1 MG PO TABS
1.0000 mg | ORAL_TABLET | Freq: Every day | ORAL | Status: DC
  Administered 2017-02-21: 1 mg via ORAL
  Filled 2017-02-20: qty 1

## 2017-02-20 MED ORDER — FENTANYL CITRATE (PF) 100 MCG/2ML IJ SOLN
INTRAMUSCULAR | Status: DC | PRN
  Administered 2017-02-20: 100 ug via INTRAVENOUS
  Administered 2017-02-20 (×2): 50 ug via INTRAVENOUS

## 2017-02-20 MED ORDER — INSULIN ASPART 100 UNIT/ML ~~LOC~~ SOLN: 0.0000 [IU] | Freq: Every day | SUBCUTANEOUS | Status: DC

## 2017-02-20 MED ORDER — LIDOCAINE 2% (20 MG/ML) 5 ML SYRINGE
INTRAMUSCULAR | Status: AC
  Filled 2017-02-20: qty 5

## 2017-02-20 MED ORDER — ROCURONIUM BROMIDE 100 MG/10ML IV SOLN
INTRAVENOUS | Status: DC | PRN
  Administered 2017-02-20: 50 mg via INTRAVENOUS

## 2017-02-20 MED ORDER — IOHEXOL 300 MG/ML  SOLN
INTRAMUSCULAR | Status: DC | PRN
  Administered 2017-02-20: 10 mL

## 2017-02-20 MED ORDER — THIAMINE HCL 100 MG/ML IJ SOLN: 100.0000 mg | Freq: Every day | INTRAMUSCULAR | Status: DC

## 2017-02-20 MED ORDER — LIDOCAINE HCL 1 % IJ SOLN
INTRAMUSCULAR | Status: AC
  Filled 2017-02-20: qty 20

## 2017-02-20 MED ORDER — SUGAMMADEX SODIUM 200 MG/2ML IV SOLN
INTRAVENOUS | Status: DC | PRN
Start: 2017-02-20 — End: 2017-02-20
  Administered 2017-02-20: 199.6 mg via INTRAVENOUS

## 2017-02-20 MED ORDER — INSULIN ASPART 100 UNIT/ML ~~LOC~~ SOLN: 0.0000 [IU] | Freq: Three times a day (TID) | SUBCUTANEOUS | Status: DC

## 2017-02-20 MED ORDER — LACTATED RINGERS IV SOLN: INTRAVENOUS | Status: DC

## 2017-02-20 MED ORDER — CARVEDILOL 6.25 MG PO TABS
6.2500 mg | ORAL_TABLET | Freq: Once | ORAL | Status: AC
Start: 2017-02-20 — End: 2017-02-20
  Administered 2017-02-20: 6.25 mg via ORAL
  Filled 2017-02-20: qty 1

## 2017-02-20 MED ORDER — ONDANSETRON HCL 4 MG/2ML IJ SOLN: 4.0000 mg | INTRAMUSCULAR | Status: DC | PRN

## 2017-02-20 MED ORDER — VITAMIN B-1 100 MG PO TABS
100.0000 mg | ORAL_TABLET | Freq: Every day | ORAL | Status: DC
  Administered 2017-02-21: 100 mg via ORAL
  Filled 2017-02-20: qty 1

## 2017-02-20 MED ORDER — ALBUTEROL SULFATE (2.5 MG/3ML) 0.083% IN NEBU: 2.5000 mg | INHALATION_SOLUTION | Freq: Four times a day (QID) | RESPIRATORY_TRACT | Status: DC | PRN

## 2017-02-20 MED ORDER — DEXTROSE 5 % IV SOLN
INTRAVENOUS | Status: DC | PRN
  Administered 2017-02-20: 50 ug/min via INTRAVENOUS

## 2017-02-20 MED ORDER — LORAZEPAM 2 MG/ML IJ SOLN: 1.0000 mg | Freq: Four times a day (QID) | INTRAMUSCULAR | Status: DC | PRN

## 2017-02-20 MED ORDER — MIDAZOLAM HCL 2 MG/2ML IJ SOLN
INTRAMUSCULAR | Status: AC
Start: 2017-02-20 — End: 2017-02-20
  Filled 2017-02-20: qty 2

## 2017-02-20 MED ORDER — CIPROFLOXACIN IN D5W 400 MG/200ML IV SOLN
400.0000 mg | Freq: Once | INTRAVENOUS | Status: AC
  Administered 2017-02-20: 400 mg via INTRAVENOUS
  Filled 2017-02-20: qty 200

## 2017-02-20 MED ORDER — METOCLOPRAMIDE HCL 5 MG/ML IJ SOLN: 10.0000 mg | Freq: Once | INTRAMUSCULAR | Status: DC | PRN

## 2017-02-20 MED ORDER — CARVEDILOL 6.25 MG PO TABS
6.2500 mg | ORAL_TABLET | Freq: Two times a day (BID) | ORAL | Status: DC
Start: 2017-02-20 — End: 2017-02-21
  Administered 2017-02-20 – 2017-02-21 (×2): 6.25 mg via ORAL
  Filled 2017-02-20 (×2): qty 1

## 2017-02-20 MED ORDER — PROPOFOL 10 MG/ML IV BOLUS
INTRAVENOUS | Status: AC
  Filled 2017-02-20: qty 20

## 2017-02-20 MED ORDER — PROPOFOL 10 MG/ML IV BOLUS
INTRAVENOUS | Status: DC | PRN
  Administered 2017-02-20: 120 mg via INTRAVENOUS

## 2017-02-20 MED ORDER — LORAZEPAM 1 MG PO TABS: 1.0000 mg | ORAL_TABLET | Freq: Four times a day (QID) | ORAL | Status: DC | PRN

## 2017-02-20 MED ORDER — ADULT MULTIVITAMIN W/MINERALS CH
1.0000 | ORAL_TABLET | Freq: Every day | ORAL | Status: DC
  Administered 2017-02-21: 1 via ORAL
  Filled 2017-02-20: qty 1

## 2017-02-20 MED ORDER — EPHEDRINE SULFATE 50 MG/ML IJ SOLN
INTRAMUSCULAR | Status: DC | PRN
  Administered 2017-02-20: 15 mg via INTRAVENOUS
  Administered 2017-02-20: 10 mg via INTRAVENOUS

## 2017-02-20 MED ORDER — MEPERIDINE HCL 50 MG/ML IJ SOLN: 6.2500 mg | INTRAMUSCULAR | Status: DC | PRN

## 2017-02-20 MED ORDER — SPIRONOLACTONE 25 MG PO TABS
25.0000 mg | ORAL_TABLET | Freq: Every day | ORAL | Status: DC
  Administered 2017-02-21: 25 mg via ORAL
  Filled 2017-02-20: qty 1

## 2017-02-20 MED ORDER — FENTANYL CITRATE (PF) 100 MCG/2ML IJ SOLN: 25.0000 ug | INTRAMUSCULAR | Status: DC | PRN

## 2017-02-20 MED ORDER — SUCCINYLCHOLINE CHLORIDE 20 MG/ML IJ SOLN
INTRAMUSCULAR | Status: DC | PRN
  Administered 2017-02-20: 120 mg via INTRAVENOUS

## 2017-02-20 MED ORDER — SODIUM CHLORIDE 0.9 % IV SOLN
INTRAVENOUS | Status: DC
  Administered 2017-02-20: 08:00:00 via INTRAVENOUS

## 2017-02-20 MED ORDER — ONDANSETRON HCL 4 MG/2ML IJ SOLN
INTRAMUSCULAR | Status: DC | PRN
  Administered 2017-02-20: 4 mg via INTRAVENOUS

## 2017-02-20 MED ORDER — FENTANYL CITRATE (PF) 100 MCG/2ML IJ SOLN
INTRAMUSCULAR | Status: DC | PRN
  Administered 2017-02-20: 25 ug via INTRAVENOUS
  Administered 2017-02-20 (×2): 50 ug via INTRAVENOUS
  Administered 2017-02-20: 25 ug via INTRAVENOUS

## 2017-02-20 SURGICAL SUPPLY — 42 items
BAG URINE DRAINAGE (UROLOGICAL SUPPLIES) IMPLANT
BASKET STONE NITINOL 3FRX115MB (UROLOGICAL SUPPLIES) IMPLANT
BENZOIN TINCTURE PRP APPL 2/3 (GAUZE/BANDAGES/DRESSINGS) ×2 IMPLANT
BLADE SURG 15 STRL LF DISP TIS (BLADE) ×1 IMPLANT
BLADE SURG 15 STRL SS (BLADE) ×1
CATH FOLEY 2W COUNCIL 20FR 5CC (CATHETERS) ×2 IMPLANT
CATH IMAGER II 65CM (CATHETERS) IMPLANT
CATH ROBINSON RED A/P 20FR (CATHETERS) IMPLANT
CATH URET DUAL LUMEN 6-10FR 50 (CATHETERS) ×2 IMPLANT
CATH X-FORCE N30 NEPHROSTOMY (TUBING) ×2 IMPLANT
COVER SURGICAL LIGHT HANDLE (MISCELLANEOUS) ×2 IMPLANT
DRAPE C-ARM 42X120 X-RAY (DRAPES) ×2 IMPLANT
DRAPE LINGEMAN PERC (DRAPES) ×2 IMPLANT
DRAPE SURG IRRIG POUCH 19X23 (DRAPES) ×2 IMPLANT
DRSG PAD ABDOMINAL 8X10 ST (GAUZE/BANDAGES/DRESSINGS) ×4 IMPLANT
DRSG TEGADERM 8X12 (GAUZE/BANDAGES/DRESSINGS) ×4 IMPLANT
FIBER LASER FLEXIVA 365 (UROLOGICAL SUPPLIES) IMPLANT
FIBER LASER TRAC TIP (UROLOGICAL SUPPLIES) IMPLANT
GAUZE SPONGE 4X4 12PLY STRL (GAUZE/BANDAGES/DRESSINGS) ×2 IMPLANT
GLOVE BIO SURGEON STRL SZ7.5 (GLOVE) ×2 IMPLANT
GOWN STRL REUS W/TWL LRG LVL3 (GOWN DISPOSABLE) ×2 IMPLANT
GUIDEWIRE AMPLATZ STIFF 0.35 (WIRE) IMPLANT
GUIDEWIRE AMPLAZ .035X145 (WIRE) ×4 IMPLANT
GUIDEWIRE STR DUAL SENSOR (WIRE) ×2 IMPLANT
KIT BASIN OR (CUSTOM PROCEDURE TRAY) ×2 IMPLANT
MANIFOLD NEPTUNE II (INSTRUMENTS) ×2 IMPLANT
NS IRRIG 1000ML POUR BTL (IV SOLUTION) ×2 IMPLANT
PACK CYSTO (CUSTOM PROCEDURE TRAY) ×2 IMPLANT
PROBE LITHOCLAST ULTRA 3.8X403 (UROLOGICAL SUPPLIES) ×2 IMPLANT
PROBE PNEUMATIC 1.0MMX570MM (UROLOGICAL SUPPLIES) ×2 IMPLANT
SHEATH PEELAWAY SET 9 (SHEATH) ×2 IMPLANT
SPONGE LAP 4X18 X RAY DECT (DISPOSABLE) ×2 IMPLANT
STENT CONTOUR 6FRX28X.038 (STENTS) ×2 IMPLANT
STONE CATCHER W/TUBE ADAPTER (UROLOGICAL SUPPLIES) ×2 IMPLANT
SUT SILK 2 0 30  PSL (SUTURE) ×1
SUT SILK 2 0 30 PSL (SUTURE) ×1 IMPLANT
SYR 10ML LL (SYRINGE) ×2 IMPLANT
SYR 20CC LL (SYRINGE) ×4 IMPLANT
TOWEL OR NON WOVEN STRL DISP B (DISPOSABLE) ×2 IMPLANT
TRAY FOLEY W/METER SILVER 16FR (SET/KITS/TRAYS/PACK) ×2 IMPLANT
TUBING CONNECTING 10 (TUBING) ×4 IMPLANT
WATER STERILE IRR 1500ML POUR (IV SOLUTION) ×2 IMPLANT

## 2017-02-20 NOTE — Sedation Documentation (Signed)
Remedicated, pain with insertion of needle

## 2017-02-20 NOTE — Anesthesia Procedure Notes (Signed)
Procedure Name: Intubation Date/Time: 02/20/2017 12:15 PM Performed by: [place] Chrissa Meetze C Pre-anesthesia Checklist: Patient identified, Emergency Drugs available, Suction available and Patient being monitored Patient Re-evaluated:Patient Re-evaluated prior to inductionOxygen Delivery Method: Circle system utilized Preoxygenation: Pre-oxygenation with 100% oxygen Intubation Type: IV induction Ventilation: Mask ventilation without difficulty Laryngoscope Size: Mac and 4 Grade View: Grade I Tube type: Oral Tube size: 7.5 mm Number of attempts: 1 Airway Equipment and Method: Stylet and Oral airway Placement Confirmation: ETT inserted through vocal cords under direct vision,  positive ETCO2 and breath sounds checked- equal and bilateral Secured at: 22 cm Tube secured with: Tape Dental Injury: Teeth and Oropharynx as per pre-operative assessment

## 2017-02-20 NOTE — Sedation Documentation (Signed)
Versed given

## 2017-02-20 NOTE — Sedation Documentation (Signed)
Patient is resting comfortably. 

## 2017-02-20 NOTE — Progress Notes (Signed)
Post-op note  Subjective: The patient is doing well.  No complaints.   Objective: Vital signs in last 24 hours: Temp:  [97 F (36.1 C)-98.1 F (36.7 C)] 97.4 F (36.3 C) (07/09 1606) Pulse Rate:  [61-81] 62 (07/09 1606) Resp:  [10-18] 16 (07/09 1606) BP: (97-126)/(47-95) 97/47 (07/09 1606) SpO2:  [96 %-100 %] 96 % (07/09 1606)  Intake/Output from previous day: No intake/output data recorded. Intake/Output this shift: Total I/O In: 300 [I.V.:300] Out: 1685 [Urine:1485; Blood:200]  Physical Exam:  General: Alert and oriented. Abdomen: Soft, Nondistended. Incisions: Clean and dry, bandaged GU: foley catheter to drainage, draining clear yellow urine. Left nephrostomy tube draining cherry red thin urine, small clot in tubing.   Lab Results:  Recent Labs  02/20/17 0810 02/20/17 1542  HGB 11.2* 11.6*  HCT 34.6* 36.2*    Assessment/Plan: POD#0   1) Continue to monitor 2) Post op BMP pending, will follow up    Callie Fielding, MD   LOS: 1 day   FILIPPOU, PAULINE L 02/20/2017, 5:06 PM

## 2017-02-20 NOTE — Sedation Documentation (Signed)
re medicated for discomfort

## 2017-02-20 NOTE — Consult Note (Signed)
Chief Complaint: Patient was seen in consultation today for left nephrostomy/ nephroureteral catheter placement  Referring Physician(s): Budzyn,B  Supervising Physician: Daryll Brod  Patient Status: [facility] OP TBA  History of Present Illness: Justin Bernard is a 56 y.o. male with history of bilateral nephrolithiasis who presents now with left flank discomfort and prior imaging revealing large dominant stone in the interpolar left kidney obstructing the upper pole collecting system. He presents today for left nephrostomy/nephroureteral catheter placement prior to nephrolithotomy.  Past Medical History:  Diagnosis Date  . AICD (automatic cardioverter/defibrillator) present   . Arthritis    L hip   . Asthma    pt. uses albuterol inhaler in the spring for enviromental allergies   . Cataract    LEFT EYE  . CHF (congestive heart failure) ([place])   . Chronic systolic heart failure (HCC)    NICM, previous heart transplant candidate with EF 10%  . Diabetes mellitus without complication ([place])    TYPE 2  . Family history of adverse reaction to anesthesia    sister- headache & N&V  . Foley catheter in place   . History of hiatal hernia   . Hypertension   . Kidney stones 1997   passed spontaneously   . Neuromuscular disorder ([place])    essential tremor  . Pneumonia 2013   hosp. - IV antibiotics   . Presence of permanent cardiac pacemaker 2012   [employer]  . Shortness of breath dyspnea    WITH EXERTION    Past Surgical History:  Procedure Laterality Date  . BACK SURGERY  07/2000   fusion , [facility]  . BONE EXCISION Right 04/29/2016   Procedure: EXCISION HETEROTOPIC BONE RIGHT HUMERUS;  Surgeon: Altamese Gardere, MD;  Location: [facility];  Service: Orthopedics;  Laterality: Right;  . CARDIAC CATHETERIZATION  08/2012  . FRACTURE SURGERY Left    hip , r shoulder   . HARDWARE REMOVAL Right 04/29/2016   Procedure: HARDWARE REMOVAL;  Surgeon: Altamese , MD;   Location: [facility];  Service: Orthopedics;  Laterality: Right;  . HERNIA REPAIR Right    inguinal   . HIP SURGERY Left YRS AGO   hardware present, for slipped joint   . ICD  2012   [employer]  . ORIF HUMERUS FRACTURE Right 07/20/2015   Procedure: OPEN REDUCTION INTERNAL FIXATION (ORIF) PROXIMAL HUMERUS FRACTURE;  Surgeon: Netta Cedars, MD;  Location: [facility];  Service: Orthopedics;  Laterality: Right;  . TONSILLECTOMY  AS CHILD  . ulnar surgery Right     Allergies: Penicillins  Medications: Prior to Admission medications   Medication Sig Start Date End Date Taking? Authorizing Provider  carvedilol (COREG) 6.25 MG tablet Take 1 tablet (6.25 mg total) by mouth 2 (two) times daily with a meal. 02/17/17  Yes Jettie Booze E, NP  furosemide (LASIX) 40 MG tablet Take 1 tablet (40 mg total) by mouth daily. Start on 09/11/16. 09/11/16  Yes Mikhail, Velta Addison, DO  glipiZIDE (GLUCOTROL XL) 5 MG 24 hr tablet Take 5 mg by mouth daily with breakfast.  04/24/16  Yes [provider]  hydrocortisone cream 1 % Apply 1 application topically 2 (two) times daily as needed for itching.   Yes [provider]  ibuprofen (ADVIL,MOTRIN) 100 MG/5ML suspension Take 400-600 mg by mouth every 4 (four) hours as needed for mild pain (depends on pain if 2-3 tablets).   Yes [provider]  Multiple Vitamin (MULTIVITAMIN WITH MINERALS) TABS tablet Take 1  tablet by mouth daily. 08/22/16  Yes Elgergawy, Silver Huguenin, MD  potassium chloride SA (K-DUR,KLOR-CON) 20 MEQ tablet Take 20 mEq by mouth daily.    Yes [provider]  spironolactone (ALDACTONE) 25 MG tablet Take 25 mg by mouth daily.   Yes [provider]  thiamine 100 MG tablet Take 1 tablet (100 mg total) by mouth daily. 08/22/16  Yes Elgergawy, Silver Huguenin, MD  albuterol (PROVENTIL HFA;VENTOLIN HFA) 108 (90 Base) MCG/ACT inhaler Inhale 2 puffs into the lungs every 6 (six) hours as needed for wheezing or shortness of breath.    [provider]  glucose monitoring kit (FREESTYLE) monitoring kit 1 each by Does not apply route 4 (four) times daily - after meals and at bedtime. 1 month Diabetic Testing Supplies for QAC-QHS accuchecks. 07/23/15   Ghimire, Henreitta Leber, MD  hydrOXYzine (ATARAX/VISTARIL) 25 MG tablet Take 1 tablet (25 mg total) by mouth 3 (three) times daily as needed for itching. 09/10/16   Mikhail, Velta Addison, DO  traMADol (ULTRAM) 50 MG tablet Take 1 tablet (50 mg total) by mouth every 6 (six) hours as needed. 09/10/16   Cristal Ford, DO     Family History  Problem Relation Age of Onset  . Heart failure Mother   . Hypertension Mother   . CAD Father   . Hypertension Other   . CAD Other     Social History   Social History  . Marital status: Married    Spouse name: N/A  . Number of children: N/A  . Years of education: N/A   Social History Main Topics  . Smoking status: Never Smoker  . Smokeless tobacco: Never Used  . Alcohol use No  . Drug use: No  . Sexual activity: Not Asked   Other Topics Concern  . None   Social History Narrative  . None      Review of Systems currently denies fever, headache, chest pain, dyspnea, cough, abdominal pain, nausea, vomiting or abnormal bleeding. He does have some intermittent left flank discomfort and occasional tea colored urine  Vital Signs: BP 124/78 (BP Location: Right Arm)   Pulse 81   Temp 98.1 F (36.7 C) (Oral)   Resp 18   SpO2 100%   Physical Exam awake, alert. Chest clear to auscultation bilaterally; left chest AICD; heart with regular rate and rhythm. Abdomen soft, positive bowel sounds, nontender; no significant lower extremity edema.  Mallampati Score:     Imaging: No results found.  Labs:  CBC:  Recent Labs  08/19/16 0649 09/09/16 1614 09/10/16 0426 02/14/17 1530  WBC 7.8 11.6* 8.2 8.7  HGB 11.9* 12.1* 10.5* 11.5*  HCT 34.8* 35.8* 31.9* 35.2*  PLT 299 413* 356 307    COAGS:  Recent Labs  04/29/16 0959  02/14/17 1530  INR 1.07 1.05  APTT 34 36    BMP:  Recent Labs  09/09/16 1614 09/10/16 0426 09/10/16 1120 09/22/16 1144 02/14/17 1530  NA 136 140  --  142 143  K 2.9* 2.8* 3.5 3.1* 3.8  CL 105 111  --  110 117*  CO2 21* 23  --  20* 21*  GLUCOSE 150* 105*  --  103* 99  BUN 21* 16  --  16 19  CALCIUM 8.9 8.3*  --  9.6 8.9  CREATININE 1.32* 1.01  --  1.29* 0.91  GFRNONAA 60* >60  --  >60 >60  GFRAA >60 >60  --  >60 >60    LIVER FUNCTION TESTS:  Recent Labs  08/14/16 0218 08/15/16 1840 08/17/16 0800 09/09/16 2056  BILITOT 1.5* 1.2 1.3* 0.6  AST 100* 103* 115* 47*  ALT 51 48 51 28  ALKPHOS 100 103 115 64  PROT 5.5* 5.4* 5.5* 6.2*  ALBUMIN 2.6* 2.7* 2.7* 3.0*    TUMOR MARKERS: No results for input(s): AFPTM, CEA, CA199, CHROMGRNA in the last 8760 hours.  Assessment and Plan: 57 y.o. male with history of bilateral nephrolithiasis who presents now with left flank discomfort and prior imaging revealing large dominant stone in the interpolar left kidney obstructing the upper pole collecting system. He presents today for left nephrostomy/nephroureteral catheter placement prior to nephrolithotomy.Risks and benefits discussed with the patient/mother including, but not limited to infection, bleeding, significant bleeding causing loss or decrease in renal function or damage to adjacent structures. All of the patient's questions were answered, patient is agreeable to proceed.Consent signed and in chart.      Thank you for this interesting consult.  I greatly enjoyed meeting Justin Bernard and look forward to participating in their care.  A copy of this report was sent to the requesting provider on this date.  Electronically Signed: D. Rowe Robert, PA-C 02/20/2017, 8:49 AM   I spent a total of 25 minutes    in face to face in clinical consultation, greater than 50% of which was counseling/coordinating care for left percutaneous nephrostomy/nephroureteral catheter  placement

## 2017-02-20 NOTE — Op Note (Signed)
PCNL Operative Note  Preoperative Diagnosis: Nephrolithiasis    Postoperative Diagnosis: Same   Procedure(s) Performed: 1. Left percutaneous nephrostolithotomy for stone burden greater than 2 cm 2. Antegrade nephrostogram with nephrostomy tube placement 3. Simple Foley catheter placement 4. Intraoperative fluoroscopy with interpretation less than 1 hour  Surgeons: Primary: Hildred Laser, MD  Resident assistant: Callie Fielding, MD   Anesthesia: Anesthesiologist: Phillips Grout, MD CRNA: [place], Stephanie C, CRNA  Endotracheal Fonnie Birkenhead  Indications for Surgery:  Justin Bernard is a 56 y.o.-year-old male with a history of nephrolithiasis.  The patient was evaluated and noted with a total stone burden of ~3 cm in the left renal pelvis, one large stone causing obstruction. The patient presents today for percutaneous treatment of left kidney stones. He underwent percutaneous nephrostomy tube placement by VIR earlier today. The risks and benefits of the procedure were discussed with the patient who wishes to proceed.     Operative Findings: Retrograde pyelogram demonstrated a large hydronephrotic obstructed upper pole with stone blocking a stenosed infindibulum. Large renal pelvic stone as well. Access into the lower pole, with significant J hook to UPJ. Treatment of the entirety of the visualized stone burden.  Antegrade nephrostogram post-treatment demonstrated minimal contrast extravasation. Successful placement of nephrostomy tube and JJ ureteral stent.  Procedure Details:       The patient was correctly identified in the preoperative holding area where written informed consent as well potential risks and complications were reviewed. The patient was brought back to the operative suite where a preinduction timeout was performed. After correct information was verified, general anesthesia was inducedvia endotracheal tube.  The patient was then gently placed in prone position.   Sequential compression devices were placed for VTE prophylaxis. The patient was prepped and draped in the usual sterile fashion and appropriate periprocedural antibiotics were administered. A second timeout was performed.  A 16 French Foley catheter was placed per urethra with return of urine with 10 mL's of water in the balloon.   Next, we performed an antegrade pyelogram, which demonstrated findings  as above. A super stiff wire was used to cannulate the nephroureteral stent, and was advanced to the bladder under fluoroscopic guidance. The nephroureteral stent was removed. Next, using a combination of catheters and dilators, we placed a second safety wire and then developed our percutaneous tract with the advancement of a 30 French x 20 cm Nephromax balloon dilator.  After this, a 30 French sheath was advanced to the edge of the distal calyx on fluoroscopy.     We then performed rigid nephroscopy with the lithotrite. Immediately upon entrance into the collecting system, we encountered the stone and we then proceeded to treat the stone with lithotripsy. After reaching the limit of the stone capable of being treated with the rigid scope, we then switched to flexible nephroscopy and removed additional stone using the basket.   At the conclusion of the procedure, we repeated flexible nephroscopy in the kidney as well and withdrew our sheath over our rigid nephroscope to the edge of renal parenchyma which did not reveal any residual stone fragments.   At this point, we elected to leave our nephrostomy tube and ureteral stent. We passed a 6 x 26cm JJ ureteral stent through our rigid nephroscope into the collecting system and across the UO visualized on fluoroscopy.  We passed a 20 French [place] tip nephrostomy tube  over our second. Antegrade nephrostogram demonstrated complete filling of the entire renal collecting system with minimal contrast extravasation  from the access tract and satisfactory placement  of her nephrostomy tube in the renal pelvis.  3 mL's of sterile water was placed in the balloon and the nephrostomy tube was affixed to the patient's skin using a single 0 silk suture in an interrupted fashion.  We then placed the nephrostomy tube to drainage.  The site was then dressed in the usual gauze and tape dressing and the patient was carefully returned to supine position. The patient was awoken from anesthesia and taken to the recovery area.  Estimated Blood Loss: 200cc         Drains: 20 [place] council tip nephrostomy tube with 3cc sterile water in balloon. 6Fr x 26cm JJ ureteral stent without dangler. 16 French Foley catheter with 10 cc in the balloon.  Both to drainage.         Total IV Fluids: See anesthesia record         Specimens: Stone for chemical analysis                 Complications:  None         Disposition: PACU - hemodynamically stable.         Condition: stable    Post-Op Plan/Instructions:    1. Admit patient to Urology Service for routine postoperative care. 2.  We will perform laboratory studies as well as obtain a CT scan postoperative day #1 to assess for any residual stone burden.  Callie Fielding, MD Urologic Surgery Resident

## 2017-02-20 NOTE — Transfer of Care (Signed)
Immediate Anesthesia Transfer of Care Note  Patient: Justin Bernard  Procedure(s) Performed: Procedure(s): NEPHROLITHOTOMY PERCUTANEOUS (Left)  Patient Location: PACU  Anesthesia Type:General  Level of Consciousness: awake and alert   Airway & Oxygen Therapy: Patient Spontanous Breathing and Patient connected to nasal cannula oxygen  Post-op Assessment: stable  Post vital signs: Reviewed and stable  Last Vitals:  Vitals:   02/20/17 1145 02/20/17 1150  BP: 121/85   Pulse:  61  Resp:    Temp:      Last Pain:  Vitals:   02/20/17 0737  TempSrc: Oral         Complications: No apparent anesthesia complications

## 2017-02-20 NOTE — Anesthesia Preprocedure Evaluation (Signed)
Anesthesia Evaluation  Patient identified by MRN, date of birth, ID band Patient awake    Reviewed: Allergy & Precautions, H&P , NPO status , Patient's Chart, lab work & pertinent test results, reviewed documented beta blocker date and time   Airway Mallampati: II  TM Distance: >3 FB Neck ROM: Full    Dental no notable dental hx. (+) Teeth Intact, Dental Advisory Given   Pulmonary asthma ,    Pulmonary exam normal breath sounds clear to auscultation       Cardiovascular hypertension, Pt. on medications and Pt. on home beta blockers +CHF  + pacemaker + Cardiac Defibrillator  Rhythm:Regular Rate:Normal     Neuro/Psych negative neurological ROS  negative psych ROS   GI/Hepatic negative GI ROS, Neg liver ROS,   Endo/Other  diabetes, Type 2, Oral Hypoglycemic Agents  Renal/GU negative Renal ROS  negative genitourinary   Musculoskeletal  (+) Arthritis , Osteoarthritis,    Abdominal   Peds  Hematology negative hematology ROS (+)   Anesthesia Other Findings   Reproductive/Obstetrics negative OB ROS                            Anesthesia Physical  Anesthesia Plan  ASA: III  Anesthesia Plan: General   Post-op Pain Management:    Induction: Intravenous  PONV Risk Score and Plan:   Airway Management Planned: Oral ETT  Additional Equipment:   Intra-op Plan:   Post-operative Plan: Extubation in OR  Informed Consent: I have reviewed the patients History and Physical, chart, labs and discussed the procedure including the risks, benefits and alternatives for the proposed anesthesia with the patient or authorized representative who has indicated his/her understanding and acceptance.   Dental advisory given  Plan Discussed with: CRNA  Anesthesia Plan Comments:         Anesthesia Quick Evaluation

## 2017-02-20 NOTE — Procedures (Signed)
Left nephrolithiasis  S/p [place] and fluoro left pcn access for PNL  No comp Stable EBL 0 Full report in PACS To OR soon

## 2017-02-20 NOTE — H&P (Signed)
CC: I have kidney stones.  HPI: Justin Bernard is a 56 year-old male established patient who is here for renal calculi.  The problem is on both sides. This is not his first kidney stone. He has had more than 5 stones prior to getting this one. He is currently having flank pain. He denies having back pain, groin pain, nausea, vomiting, fever, and chills. He has not caught a stone in his urine strainer since his symptoms began.   He has never had surgical treatment for calculi in the past.   56 y/o with PMH significant for alcohol history and CHF. He does have known bilateral nephrolithaiasis based on C.T. imaging obtained on 09/09/16. He presented to the emergency room on 09/09/16 with an episode of urinary retention. At the time, his bladder was scanned and showed 900 mL of urine. At the time, he denied any past problems voiding. He stated that his flow was normally good with a normal stream. He denies history of UTI's. He has had a catheter before, which he states was placed with no problems. C.T. imagine shows multiple non obstructing right renal stones evident with the largest stone in the upper pole measuring 7 mm. No right ureteral stones. He has multiple left renal stones are identified including a 1.4 x 1.8 x  2.7 cm stone in the region the renal pelvis, apparently obstructing  the upper pole collecting system.   Today he presents for follow up for his renal stones. This had multiple stones in the past. There are last in 1999. He passed them all spontaneously. She does not know the name of the stone which thinks cystine may be less tight. He has never had surgery for stones before. He has no anticoagulation or bleeding issues.     ALLERGIES: No Allergies    MEDICATIONS: Benadryl Allergy  Folic Acid 1 mg tablet  Furosemide 40 mg tablet  Gabapentin 600 mg tablet  Glipizide Er 5 mg tablet, extended release 24 hr  Klor-Con M20 20 meq tablet, ext release, particles/crystals  Proair Hfa 90 mcg  hfa aerosol with adapter  Vitamin B-12 1,000 mcg tablet, extended release     GU PSH: None   NON-GU PSH: Back Surgery (Unspecified) - 2001 Hip Replacement - 1980 Shoulder Surgery (Unspecified) - 2016    GU PMH: Kidney Stone - 09/27/2016, - 09/27/2016 Urinary Retention, Unspec - 09/16/2016 Urinary Retention - 09/14/2016    NON-GU PMH: Anxiety Arthritis Asthma Depression Diabetes Type 2 Heart disease, unspecified Hypertension    FAMILY HISTORY: Heart Disease - Grandfather   SOCIAL HISTORY: Marital Status: Divorced Current Smoking Status: Patient has never smoked.   Tobacco Use Assessment Completed: Used Tobacco in last 30 days? Has never drank.  Does not drink caffeine. 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    REVIEW OF SYSTEMS:    GU Review Male:   Patient reports stream starts and stops and trouble starting your stream. Patient denies frequent urination, hard to postpone urination, burning/ pain with urination, get up at night to urinate, leakage of urine, have to strain to urinate , erection problems, and penile pain.  Gastrointestinal (Upper):   Patient denies nausea, vomiting, and indigestion/ heartburn.  Gastrointestinal (Lower):   Patient denies diarrhea and constipation.  Constitutional:   Patient denies fever, night sweats, weight loss, and fatigue.  Skin:   Patient denies skin rash/ lesion and itching.  Eyes:   Patient denies blurred vision and double vision.  Ears/ Nose/ Throat:   Patient denies sore throat  and sinus problems.  Hematologic/Lymphatic:   Patient denies swollen glands and easy bruising.  Cardiovascular:   Patient reports leg swelling. Patient denies chest pains.  Respiratory:   Patient denies cough and shortness of breath.  Endocrine:   Patient denies excessive thirst.  Musculoskeletal:   Patient denies back pain and joint pain.  Neurological:   Patient denies headaches and dizziness.  Psychologic:   Patient denies anxiety and depression.   VITAL  SIGNS:      11/29/2016 08:46 AM  BP 115/75 mmHg  Pulse 75 /min  Temperature 97.1 F / 36 C   MULTI-SYSTEM PHYSICAL EXAMINATION:    Constitutional: Well-nourished. No physical deformities. Normally developed. Good grooming.  Neck: Neck symmetrical, not swollen. Normal tracheal position.  Respiratory: No labored breathing, no use of accessory muscles.   Cardiovascular: Normal temperature, normal extremity pulses, no swelling, no varicosities.  Skin: No paleness, no jaundice, no cyanosis. No lesion, no ulcer, no rash.  Gastrointestinal: No mass, no tenderness, no rigidity, non obese abdomen.  Eyes: Normal conjunctivae. Normal eyelids.  Ears, Nose, Mouth, and Throat: Left ear no scars, no lesions, no masses. Right ear no scars, no lesions, no masses. Nose no scars, no lesions, no masses. Normal hearing. Normal lips.  Musculoskeletal: Normal gait and station of head and neck.     PAST DATA REVIEWED:  Source Of History:  Patient   PROCEDURES:          Urinalysis w/Scope - 81001 Dipstick Dipstick Cont'd Micro  Color: Yellow Bilirubin: Neg WBC/hpf: 10 - 20/hpf  Appearance: Cloudy Ketones: Neg RBC/hpf: >60/hpf  Specific Gravity: 1.020 Blood: 3+ Bacteria: Rare (0-9/hpf)  pH: 6.5 Protein: 2+ Cystals: NS (Not Seen)  Glucose: Neg Urobilinogen: 0.2 Casts: NS (Not Seen)    Nitrites: Neg Trichomonas: Not Present    Leukocyte Esterase: 2+ Mucous: Not Present      Epithelial Cells: 0 - 5/hpf      Yeast: NS (Not Seen)      Sperm: Not Present    Notes:      ASSESSMENT:      ICD-10 Details  1 GU:   Kidney Stone - N20.0    PLAN:           Orders Labs Urine Culture          Schedule Procedure: Unspecified Date - PCNL - 50080, left          Document Letter(s):  Created for Patient: Clinical Summary   Created for [employer]         Notes:   1. Left nephrolithiasis  I discussed all treatment options with the patient regarding his large left ureteral stone. Discussed  lithotripsy, ureteroscopy, and left PCNL. He does know that due to the size of the stone as best chance for success is with percutaneous nephrolithotomy. We discussed the risks, benefits, indications for this procedure. He understands the risks including bleeding, infection, renal loss, need for repeat surgeries, hospital stay, and postoperative infection among others. He understands that he will need a nephroureteral stent placed prior by interventional radiology. He will be admitted to the hospital for nephrostomy tube left ureteral stent, and a Foley. All questions were answered. He is elected to proceed.   2. Right nonobstructing stones  We'll address the smaller stones after addressing his large left renal stones.

## 2017-02-20 NOTE — Anesthesia Postprocedure Evaluation (Signed)
Anesthesia Post Note  Patient: Justin Bernard  Procedure(s) Performed: Procedure(s) (LRB): NEPHROLITHOTOMY PERCUTANEOUS (Left)     Patient location during evaluation: PACU Anesthesia Type: General Level of consciousness: awake and alert Pain management: pain level controlled Vital Signs Assessment: post-procedure vital signs reviewed and stable Respiratory status: spontaneous breathing, nonlabored ventilation, respiratory function stable and patient connected to nasal cannula oxygen Cardiovascular status: blood pressure returned to baseline and stable Postop Assessment: no signs of nausea or vomiting Anesthetic complications: no    Last Vitals:  Vitals:   02/20/17 1545 02/20/17 1606  BP: 110/74 (!) 97/47  Pulse: 68 62  Resp: 14 16  Temp:  (!) 36.3 C    Last Pain:  Vitals:   02/20/17 0737  TempSrc: Oral                 Phillips Grout

## 2017-02-20 NOTE — Progress Notes (Signed)
Patient BP low manually.Patient is not symptomatic. Urology on call paged. New orders received and followed out. Will continue to monitor patient.

## 2017-02-20 NOTE — Sedation Documentation (Signed)
Medicated for pain, grimace and slight moan

## 2017-02-20 NOTE — Progress Notes (Signed)
BP 77/56 HR 64, MD made aware. Ordered 500cc bolus over 2 hours. Will report to night shift RN to recheck BP.

## 2017-02-21 ENCOUNTER — Observation Stay (HOSPITAL_COMMUNITY): Payer: 59

## 2017-02-21 DIAGNOSIS — N200 Calculus of kidney: Secondary | ICD-10-CM | POA: Diagnosis not present

## 2017-02-21 LAB — CBC
HCT: 32.2 % — ABNORMAL LOW (ref 39.0–52.0)
Hemoglobin: 10.5 g/dL — ABNORMAL LOW (ref 13.0–17.0)
MCH: 29.7 pg (ref 26.0–34.0)
MCHC: 32.6 g/dL (ref 30.0–36.0)
MCV: 91.2 fL (ref 78.0–100.0)
PLATELETS: 245 10*3/uL (ref 150–400)
RBC: 3.53 MIL/uL — AB (ref 4.22–5.81)
RDW: 14.1 % (ref 11.5–15.5)
WBC: 9.5 10*3/uL (ref 4.0–10.5)

## 2017-02-21 LAB — BASIC METABOLIC PANEL
Anion gap: 8 (ref 5–15)
BUN: 20 mg/dL (ref 6–20)
CO2: 20 mmol/L — ABNORMAL LOW (ref 22–32)
Calcium: 8.4 mg/dL — ABNORMAL LOW (ref 8.9–10.3)
Chloride: 112 mmol/L — ABNORMAL HIGH (ref 101–111)
Creatinine, Ser: 0.96 mg/dL (ref 0.61–1.24)
GFR calc Af Amer: >60 mL/min (ref >60)
Glucose, Bld: 100 mg/dL — ABNORMAL HIGH (ref 65–99)
POTASSIUM: 3.9 mmol/L (ref 3.5–5.1)
SODIUM: 140 mmol/L (ref 135–145)

## 2017-02-21 LAB — GLUCOSE, CAPILLARY
GLUCOSE-CAPILLARY: 96 mg/dL (ref 65–99)
Glucose-Capillary: 97 mg/dL (ref 65–99)

## 2017-02-21 MED ORDER — CIPROFLOXACIN HCL 500 MG PO TABS: 500.0000 mg | ORAL_TABLET | Freq: Two times a day (BID) | ORAL | 0 refills | Status: AC

## 2017-02-21 MED ORDER — TRAMADOL HCL 50 MG PO TABS: 50.0000 mg | ORAL_TABLET | Freq: Four times a day (QID) | ORAL | 0 refills | Status: DC | PRN

## 2017-02-21 MED ORDER — CIPROFLOXACIN IN D5W 400 MG/200ML IV SOLN
400.0000 mg | Freq: Once | INTRAVENOUS | Status: AC
  Administered 2017-02-21: 400 mg via INTRAVENOUS
  Filled 2017-02-21: qty 200

## 2017-02-21 NOTE — Discharge Summary (Signed)
[facility] Discharge Summary  Admit date: 02/20/2017  Discharge date and time: 02/21/17   Discharge to: Home  Discharge Service: Urology  Discharge Attending Physician:  Dr. Baruch Gouty  Discharge  Diagnoses:  Nephrolithiasis  Secondary Diagnosis: Active Problems:   Nephrolithiasis   OR Procedures: Procedure(s): LEFT NEPHROLITHOTOMY PERCUTANEOUS 02/20/2017   Ancillary Procedures: None   Discharge Day Services: The patient was seen and examined by the Urology team both in the morning and immediately prior to discharge.  Vital signs and laboratory values were stable and within normal limits.  The physical exam was benign and unchanged and all surgical wounds were examined.  Discharge instructions were explained and all questions answered.  Subjective  No acute events overnight. Pain Controlled. No fever or chills.  Objective Patient Vitals for the past 8 hrs:  Height Weight  02/21/17 0700 _0  (1.93 m) 99.8 kg (220 lb)   Total I/O In: 118.8 [I.V.:118.8] Out: 1150 [Urine:1150]  General Appearance:        No acute distress Lungs:                 Normal work of breathing on room air Heart:                             Regular rate and rhythm Abdomen:                       Soft, non-tender, non-distended. Left PCN incision CD, dressed with gauze Extremities:                  Warm and well perfused    Hospital Course:  The patient underwent LPCNL on 02/20/2017.  The patient tolerated the procedure well, was extubated in the OR, and afterwards was taken to the PACU for routine post-surgical care. When stable the patient was transferred to the floor.   The patient did well postoperatively.  The patient's diet was slowly advanced and at the time of discharge was tolerating a regular diet. Foley catheter was removed on POD1, and patient voided to completion with PVR 0cc. His nephrostomy tube was capped for several hours, and he tolerated this without nausea or flank pain. The  nephrostomy tube was removed.  The patient was discharged home 1 Day Post-Op, at which point was tolerating a regular solid diet, was able to void spontaneously, have adequate pain control with P.O. pain medication, and could ambulate without difficulty. The patient will follow up with [place] for post op check on 7/23. He was discharged with a 5 day course of ciprofloxacin.    Condition at Discharge: Improved  Discharge Medications:  Allergies as of 02/21/2017      Reactions   Penicillins Other (See Comments)   Has patient had a PCN reaction causing immediate rash, facial/tongue/throat swelling, SOB or lightheadedness with hypotension: Yes Has patient had a PCN reaction causing severe rash involving mucus membranes or skin necrosis: Yes Has patient had a PCN reaction that required hospitalization:  No Has patient had a PCN reaction occurring within the last 10 years: No If all of the above answers are "NO", then may proceed with Cephalosporin use..  childhood reaction, swelling & had to be given an anti      Medication List    TAKE these medications   albuterol 108 (90 Base) MCG/ACT inhaler Commonly known as:  PROVENTIL HFA;VENTOLIN HFA Inhale 2 puffs into the lungs every  6 (six) hours as needed for wheezing or shortness of breath.   carvedilol 6.25 MG tablet Commonly known as:  COREG Take 1 tablet (6.25 mg total) by mouth 2 (two) times daily with a meal.   ciprofloxacin 500 MG tablet Commonly known as:  CIPRO Take 1 tablet (500 mg total) by mouth 2 (two) times daily.   furosemide 40 MG tablet Commonly known as:  LASIX Take 1 tablet (40 mg total) by mouth daily. Start on 09/11/16.   glipiZIDE 5 MG 24 hr tablet Commonly known as:  GLUCOTROL XL Take 5 mg by mouth daily with breakfast.   glucose monitoring kit monitoring kit 1 each by Does not apply route 4 (four) times daily - after meals and at bedtime. 1 month Diabetic Testing Supplies for QAC-QHS accuchecks.   hydrocortisone  cream 1 % Apply 1 application topically 2 (two) times daily as needed for itching.   hydrOXYzine 25 MG tablet Commonly known as:  ATARAX/VISTARIL Take 1 tablet (25 mg total) by mouth 3 (three) times daily as needed for itching.   ibuprofen 100 MG/5ML suspension Commonly known as:  ADVIL,MOTRIN Take 400-600 mg by mouth every 4 (four) hours as needed for mild pain (depends on pain if 2-3 tablets).   multivitamin with minerals Tabs tablet Take 1 tablet by mouth daily.   potassium chloride SA 20 MEQ tablet Commonly known as:  K-DUR,KLOR-CON Take 20 mEq by mouth daily.   spironolactone 25 MG tablet Commonly known as:  ALDACTONE Take 25 mg by mouth daily.   thiamine 100 MG tablet Take 1 tablet (100 mg total) by mouth daily.   traMADol 50 MG tablet Commonly known as:  ULTRAM Take 1 tablet (50 mg total) by mouth every 6 (six) hours as needed.        Pending Test Results: Stone analysis  Discharge Instructions:  Discharge Instructions    Call MD for:  difficulty breathing, headache or visual disturbances    Complete by:  As directed    Call MD for:  persistant nausea and vomiting    Complete by:  As directed    Call MD for:  redness, tenderness, or signs of infection (pain, swelling, redness, odor or green/yellow discharge around incision site)    Complete by:  As directed    Call MD for:  severe uncontrolled pain    Complete by:  As directed    Call MD for:  temperature >100.4    Complete by:  As directed    Diet - low sodium heart healthy    Complete by:  As directed    Discharge instructions    Complete by:  As directed    You may see some blood in the urine and may have some burning with urination for 48-72 hours. You also may notice that you have to urinate more frequently or urgently after your procedure which is normal.  You should call should you develop an inability urinate, fever > 101, persistent nausea and vomiting that prevents you from eating or drinking to  stay hydrated.  If you have a stent, you will likely urinate more frequently and urgently until the stent is removed and you may experience some discomfort/pain in the lower abdomen and flank especially when urinating. You may take pain medication prescribed to you if needed for pain. You may also intermittently have blood in the urine until the stent is removed.   Increase activity slowly    Complete by:  As directed  Lifting restrictions    Complete by:  As directed    Nothing greater than 10 pounds for 4 weeks   May shower / Bathe    Complete by:  As directed    Shower only until back incision heals. No baths, pools, lakes or soaking the incision.

## 2017-02-21 NOTE — Progress Notes (Addendum)
UROLOGY PROGRESS NOTES  Assessment:  Justin Bernard is a 56 y.o. male with a history of  Past Medical History:  Diagnosis Date  . AICD (automatic cardioverter/defibrillator) present   . Arthritis    L hip   . Asthma    pt. uses albuterol inhaler in the spring for enviromental allergies   . Cataract    LEFT EYE  . CHF (congestive heart failure) (HCC)   . Chronic systolic heart failure (HCC)    NICM, previous heart transplant candidate with EF 10%  . Diabetes mellitus without complication (HCC)    TYPE 2  . Family history of adverse reaction to anesthesia    sister- headache & N&V  . Foley catheter in place   . History of hiatal hernia   . Hypertension   . Kidney stones 1997   passed spontaneously   . Neuromuscular disorder (HCC)    essential tremor  . Pneumonia 2013   hosp. - IV antibiotics   . Presence of permanent cardiac pacemaker 2012   [employer]  . Shortness of breath dyspnea    WITH EXERTION   who is s/p L percuatenous nephrostomy tube acccess by VIR and L PCNL on 02/20/17.   Interval:  Did well overnight. Required total of 1L bolus for low BP, responsive and wnl this AM. No issues on telemetry. No pain. Eating, walking without concern. CT stone protocol this AM shows significant decrease in stone burden with residual left stone on lower pole, known right sided stone.   Plan:   Neuro: - pain control with tramadol, morphine PRN, sch tylenol  Respiratory: SORA  CV: HDS, continue home heart meds   FEN/GI: - medlock, regular diet  - bowel regimen with senna - PRN zofran   GU: - monitor UOP - d/c foley, TOV with PVR - cap nephrostomy tube   Endocrine: -ISS  Heme/ID: - afebrile, stable - WBC WNL  - Hb stable continue to monitor - coninue ciprofloxacin ppx while in hosue   PPx: OOB/IS   Dispo: if does well with nephrostomy tube capped, will remove in PM and d/c home   Callie Fielding, MD Urologic Surgery Resident  -------    Subjective: Feels well ready to go home.   Objective:  Vital signs in last 24 hours: Temp:  [97 F (36.1 C)-99.4 F (37.4 C)] 99.4 F (37.4 C) (07/10 0445) Pulse Rate:  [61-85] 85 (07/10 0445) Resp:  [10-18] 17 (07/10 0445) BP: (77-127)/(47-95) 116/67 (07/10 0445) SpO2:  [96 %-100 %] 100 % (07/10 0445)  07/09 0701 - 07/10 0700 In: 1867.1 [P.O.:240; I.V.:1427.1; IV Piggyback:200] Out: 2360 [Urine:2160; Blood:200]   Physical Exam:  General:  well-developed and well-nourished  in NAD, lying in bed, alert & oriented, pleasant HEENT: [place]/AT, EOMI, sclera anicteric, hearing grossly intact, no nasal discharge, MMM Respiratory: nonlabored respirations, satting well on RA, symmetrical chest rise Cardiovascular: appropriate perfusion  Abdominal: soft, NTTP, nondistended GU: foley catheter draining pink tinged yellow urine, left nephrostomy tube draining pink urine. Incision site clean dry, no swelling or erythema Extremities: warm, well-perfused, no c/c/e Neuro: no focal deficits  Data Review: Results for orders placed or performed during the hospital encounter of 02/20/17 (from the past 24 hour(s))  Glucose, capillary     Status: None   Collection Time: 02/20/17  8:05 AM  Result Value Ref Range   Glucose-Capillary 93 65 - 99 mg/dL  APTT upon arrival     Status: None   Collection Time: 02/20/17  8:10 AM  Result Value Ref Range   aPTT 35 24 - 36 seconds  CBC upon arrival     Status: Abnormal   Collection Time: 02/20/17  8:10 AM  Result Value Ref Range   WBC 7.7 4.0 - 10.5 K/uL   RBC 3.83 (L) 4.22 - 5.81 MIL/uL   Hemoglobin 11.2 (L) 13.0 - 17.0 g/dL   HCT 40.9 (L) 81.1 - 91.4 %   MCV 90.3 78.0 - 100.0 fL   MCH 29.2 26.0 - 34.0 pg   MCHC 32.4 30.0 - 36.0 g/dL   RDW 78.2 95.6 - 21.3 %   Platelets 275 150 - 400 K/uL  Protime-INR upon arrival     Status: None   Collection Time: 02/20/17  8:10 AM  Result Value Ref Range   Prothrombin Time 13.8 11.4 - 15.2 seconds   INR 1.05    Glucose, capillary     Status: None   Collection Time: 02/20/17  3:03 PM  Result Value Ref Range   Glucose-Capillary 75 65 - 99 mg/dL   Comment 1 Notify RN    Comment 2 Document in Chart   Basic metabolic panel     Status: Abnormal   Collection Time: 02/20/17  3:37 PM  Result Value Ref Range   Sodium 140 135 - 145 mmol/L   Potassium 4.1 3.5 - 5.1 mmol/L   Chloride 114 (H) 101 - 111 mmol/L   CO2 23 22 - 32 mmol/L   Glucose, Bld 93 65 - 99 mg/dL   BUN 18 6 - 20 mg/dL   Creatinine, Ser 0.86 0.61 - 1.24 mg/dL   Calcium 8.4 (L) 8.9 - 10.3 mg/dL   GFR calc non Af Amer >60 >60 mL/min   GFR calc Af Amer >60 >60 mL/min   Anion gap 3 (L) 5 - 15  Hemoglobin and hematocrit, blood     Status: Abnormal   Collection Time: 02/20/17  3:42 PM  Result Value Ref Range   Hemoglobin 11.6 (L) 13.0 - 17.0 g/dL   HCT 57.8 (L) 46.9 - 62.9 %  Glucose, capillary     Status: None   Collection Time: 02/20/17  4:26 PM  Result Value Ref Range   Glucose-Capillary 73 65 - 99 mg/dL  Glucose, capillary     Status: None   Collection Time: 02/20/17  8:38 PM  Result Value Ref Range   Glucose-Capillary 88 65 - 99 mg/dL  CBC     Status: Abnormal   Collection Time: 02/21/17  4:39 AM  Result Value Ref Range   WBC 9.5 4.0 - 10.5 K/uL   RBC 3.53 (L) 4.22 - 5.81 MIL/uL   Hemoglobin 10.5 (L) 13.0 - 17.0 g/dL   HCT 52.8 (L) 41.3 - 24.4 %   MCV 91.2 78.0 - 100.0 fL   MCH 29.7 26.0 - 34.0 pg   MCHC 32.6 30.0 - 36.0 g/dL   RDW 01.0 27.2 - 53.6 %   Platelets 245 150 - 400 K/uL  Basic metabolic panel     Status: Abnormal   Collection Time: 02/21/17  4:39 AM  Result Value Ref Range   Sodium 140 135 - 145 mmol/L   Potassium 3.9 3.5 - 5.1 mmol/L   Chloride 112 (H) 101 - 111 mmol/L   CO2 20 (L) 22 - 32 mmol/L   Glucose, Bld 100 (H) 65 - 99 mg/dL   BUN 20 6 - 20 mg/dL   Creatinine, Ser 6.44 0.61 - 1.24 mg/dL   Calcium  8.4 (L) 8.9 - 10.3 mg/dL   GFR calc non Af Amer >60 >60 mL/min   GFR calc Af Amer >60 >60 mL/min    Anion gap 8 5 - 15    Imaging: .  CT renal stone study 7/10: Left kidney with residual stone burden. Air in collecting system, moderate stranding around left kidney. No hematoma.     Attestation: I have reviewed the above note by Dr. Merrily Pew and agree with her A/P. I personally examined and interviewed the patient on rounds this morning and agree with the exam above.  Hadley Pen, M.D.

## 2017-03-08 ENCOUNTER — Other Ambulatory Visit: Payer: Self-pay | Admitting: Urology

## 2017-03-28 NOTE — Progress Notes (Signed)
  EKG 09-10-16 [facility] CHEST XRAY 08-13-16 [facility] LAST DEVICE CHECK 02-17-17 [facility] CHF VISIT 02-17-17 Suzzette Righter NP EPIC

## 2017-03-28 NOTE — Patient Instructions (Signed)
Justin Bernard  03/28/2017   Your procedure is scheduled on: 04-03-17  Report to [facility] Main  Entrance Take [facility]  elevators to 3rd floor to  Short Stay Center at 1220 pm.   Call this number if you have problems the morning of surgery [phone]    Remember: ONLY 1 PERSON MAY GO WITH YOU TO SHORT STAY TO GET  READY MORNING OF YOUR SURGERY.  Do not eat food AFTER MIDNGHT , CLEAR LIQUIDS FROM MIDNIGHT UNTIL 820 AM DAY OF SURGERY, NOTHING BY MOUTH AFTER 820 AM DAY OF SURGERY.      Take these medicines the morning of surgery with A SIP OF WATER: ALBUTEROL INHALER IF NEEDED AND BRING INHALER WITH YOU, CARVEDILOL (COREG) DO NOT TAKE ANY DIABETIC MEDICATIONS DAY OF YOUR SURGERY                               You may not have any metal on your body including hair pins and              piercings  Do not wear jewelry, make-up, lotions, powders or perfumes, deodorant             Do not wear nail polish.  Do not shave  48 hours prior to surgery.              Men may shave face and neck.   Do not bring valuables to the hospital. [facility] IS NOT             RESPONSIBLE   FOR VALUABLES.  Contacts, dentures or bridgework may not be worn into surgery.  Leave suitcase in the car. After surgery it may be brought to your room.     Patients discharged the day of surgery will not be allowed to drive home.  Name and phone number of your driver:  Special Instructions: N/A              Please read over the following fact sheets you were given: _____________________________________________________________________             How to Manage Your Diabetes Before and After Surgery  Why is it important to control my blood sugar before and after surgery? . Improving blood sugar levels before and after surgery helps healing and can limit problems. . A way of improving blood sugar control is eating a healthy diet by: o  Eating less sugar and carbohydrates o  Increasing  activity/exercise o  Talking with your doctor about reaching your blood sugar goals . High blood sugars (greater than 180 mg/dL) can raise your risk of infections and slow your recovery, so you will need to focus on controlling your diabetes during the weeks before surgery. . Make sure that the doctor who takes care of your diabetes knows about your planned surgery including the date and location.  How do I manage my blood sugar before surgery? . Check your blood sugar at least 4 times a day, starting 2 days before surgery, to make sure that the level is not too high or low. o Check your blood sugar the morning of your surgery when you wake up and every 2 hours until you get to the Short Stay unit. . If your blood sugar is less than 70 mg/dL, you will need to treat for low blood  sugar: o Do not take insulin. o Treat a low blood sugar (less than 70 mg/dL) with  cup of clear juice (cranberry or apple), 4 glucose tablets, OR glucose gel. o Recheck blood sugar in 15 minutes after treatment (to make sure it is greater than 70 mg/dL). If your blood sugar is not greater than 70 mg/dL on recheck, call [phone] for further instructions. . Report your blood sugar to the short stay nurse when you get to Short Stay.  . If you are admitted to the hospital after surgery: o Your blood sugar will be checked by the staff and you will probably be given insulin after surgery (instead of oral diabetes medicines) to make sure you have good blood sugar levels. o The goal for blood sugar control after surgery is 80-180 mg/dL.   WHAT DO I DO ABOUT MY DIABETES MEDICATION?  Marland Kitchen Do not take oral diabetes medicines (pills) the morning of surgery.  . THE  DAY BEFORE SURGERY 04-02-17 TAKE YOUR MORNING DOSE OF GLIPIZIDE.   . THE DAY OF SURGERY 04-03-17 DO NOT TAKE YOUR MORNING DOSE OF GLIPIZIDE.   Marland Kitchen    Patient Signature:  Date:   Nurse Signature:  Date:   Reviewed and Endorsed by [facility] Patient Education  Committee, August 2015   CLEAR LIQUID DIET   Foods Allowed                                                                     Foods Excluded  Coffee and tea, regular and decaf                             liquids that you cannot  Plain Jell-O in any flavor                                             see through such as: Fruit ices (not with fruit pulp)                                     milk, soups, orange juice  Iced Popsicles                                    All solid food Carbonated beverages, regular and diet                                    Cranberry, grape and apple juices Sports drinks like Gatorade Lightly seasoned clear broth or consume(fat free) Sugar, honey syrup  Sample Menu Breakfast                                Lunch  Supper Cranberry juice                    Beef broth                            Chicken broth Jell-O                                     Grape juice                           Apple juice Coffee or tea                        Jell-O                                      Popsicle                                                Coffee or tea                        Coffee or tea  _____________________________________________________________________  [facility] - Preparing for Surgery Before surgery, you can play an important role.  Because skin is not sterile, your skin needs to be as free of germs as possible.  You can reduce the number of germs on your skin by washing with CHG (chlorahexidine gluconate) soap before surgery.  CHG is an antiseptic cleaner which kills germs and bonds with the skin to continue killing germs even after washing. Please DO NOT use if you have an allergy to CHG or antibacterial soaps.  If your skin becomes reddened/irritated stop using the CHG and inform your nurse when you arrive at Short Stay. Do not shave (including legs and underarms) for at least 48 hours prior to the first CHG shower.  You  may shave your face/neck. Please follow these instructions carefully:  1.  Shower with CHG Soap the night before surgery and the  morning of Surgery.  2.  If you choose to wash your hair, wash your hair first as usual with your  normal  shampoo.  3.  After you shampoo, rinse your hair and body thoroughly to remove the  shampoo.                           4.  Use CHG as you would any other liquid soap.  You can apply chg directly  to the skin and wash                       Gently with a scrungie or clean washcloth.  5.  Apply the CHG Soap to your body ONLY FROM THE NECK DOWN.   Do not use on face/ open                           Wound or open sores. Avoid contact with eyes, ears mouth and genitals (private parts).  Wash face,  Genitals (private parts) with your normal soap.             6.  Wash thoroughly, paying special attention to the area where your surgery  will be performed.  7.  Thoroughly rinse your body with warm water from the neck down.  8.  DO NOT shower/wash with your normal soap after using and rinsing off  the CHG Soap.                9.  Pat yourself dry with a clean towel.            10.  Wear clean pajamas.            11.  Place clean sheets on your bed the night of your first shower and do not  sleep with pets. Day of Surgery : Do not apply any lotions/deodorants the morning of surgery.  Please wear clean clothes to the hospital/surgery center.

## 2017-03-30 ENCOUNTER — Encounter (HOSPITAL_COMMUNITY): Payer: Self-pay

## 2017-03-30 ENCOUNTER — Encounter (INDEPENDENT_AMBULATORY_CARE_PROVIDER_SITE_OTHER): Payer: Self-pay

## 2017-03-30 ENCOUNTER — Encounter (HOSPITAL_COMMUNITY)
Admission: RE | Admit: 2017-03-30 | Discharge: 2017-03-30 | Disposition: A | Discharge status: Home / Self Care | Payer: 59 | Source: Ambulatory Visit | Attending: Urology | Admitting: Urology

## 2017-03-30 DIAGNOSIS — Z95810 Presence of automatic (implantable) cardiac defibrillator: Secondary | ICD-10-CM | POA: Diagnosis not present

## 2017-03-30 DIAGNOSIS — F10129 Alcohol abuse with intoxication, unspecified: Secondary | ICD-10-CM | POA: Insufficient documentation

## 2017-03-30 DIAGNOSIS — R339 Retention of urine, unspecified: Secondary | ICD-10-CM | POA: Diagnosis not present

## 2017-03-30 DIAGNOSIS — E876 Hypokalemia: Secondary | ICD-10-CM | POA: Insufficient documentation

## 2017-03-30 DIAGNOSIS — I5022 Chronic systolic (congestive) heart failure: Secondary | ICD-10-CM | POA: Insufficient documentation

## 2017-03-30 DIAGNOSIS — Z01812 Encounter for preprocedural laboratory examination: Secondary | ICD-10-CM | POA: Insufficient documentation

## 2017-03-30 DIAGNOSIS — N200 Calculus of kidney: Secondary | ICD-10-CM | POA: Insufficient documentation

## 2017-03-30 DIAGNOSIS — E119 Type 2 diabetes mellitus without complications: Secondary | ICD-10-CM | POA: Diagnosis not present

## 2017-03-30 DIAGNOSIS — I119 Hypertensive heart disease without heart failure: Secondary | ICD-10-CM | POA: Insufficient documentation

## 2017-03-30 DIAGNOSIS — I959 Hypotension, unspecified: Secondary | ICD-10-CM | POA: Diagnosis not present

## 2017-03-30 HISTORY — DX: Personal history of urinary calculi: Z87.442

## 2017-03-30 LAB — CBC
HEMATOCRIT: 35.1 % — AB (ref 39.0–52.0)
Hemoglobin: 11.4 g/dL — ABNORMAL LOW (ref 13.0–17.0)
MCH: 28.7 pg (ref 26.0–34.0)
MCHC: 32.5 g/dL (ref 30.0–36.0)
MCV: 88.4 fL (ref 78.0–100.0)
PLATELETS: 340 10*3/uL (ref 150–400)
RBC: 3.97 MIL/uL — ABNORMAL LOW (ref 4.22–5.81)
RDW: 12.9 % (ref 11.5–15.5)
WBC: 8.1 10*3/uL (ref 4.0–10.5)

## 2017-03-30 LAB — BASIC METABOLIC PANEL
Anion gap: 8 (ref 5–15)
BUN: 21 mg/dL — AB (ref 6–20)
CHLORIDE: 115 mmol/L — AB (ref 101–111)
CO2: 20 mmol/L — AB (ref 22–32)
CREATININE: 1.05 mg/dL (ref 0.61–1.24)
Calcium: 9.1 mg/dL (ref 8.9–10.3)
GFR calc Af Amer: >60 mL/min (ref >60)
GFR calc non Af Amer: >60 mL/min (ref >60)
GLUCOSE: 101 mg/dL — AB (ref 65–99)
POTASSIUM: 3.8 mmol/L (ref 3.5–5.1)
SODIUM: 143 mmol/L (ref 135–145)

## 2017-03-30 LAB — GLUCOSE, CAPILLARY: Glucose-Capillary: 97 mg/dL (ref 65–99)

## 2017-03-31 LAB — URINE CULTURE: CULTURE: NO GROWTH

## 2017-04-03 ENCOUNTER — Ambulatory Visit (HOSPITAL_COMMUNITY)
Admission: RE | Admit: 2017-04-03 | Discharge: 2017-04-03 | Disposition: A | Discharge status: Home / Self Care | Payer: 59 | Source: Ambulatory Visit | Attending: Urology | Admitting: Urology

## 2017-04-03 ENCOUNTER — Encounter (HOSPITAL_COMMUNITY): Payer: Self-pay | Admitting: Anesthesiology

## 2017-04-03 ENCOUNTER — Ambulatory Visit (HOSPITAL_COMMUNITY): Payer: 59 | Admitting: Anesthesiology

## 2017-04-03 ENCOUNTER — Encounter (HOSPITAL_COMMUNITY)
Admission: RE | Disposition: A | Discharge status: Home / Self Care | Payer: Self-pay | Source: Ambulatory Visit | Attending: Urology

## 2017-04-03 ENCOUNTER — Ambulatory Visit (HOSPITAL_COMMUNITY): Payer: 59

## 2017-04-03 DIAGNOSIS — E119 Type 2 diabetes mellitus without complications: Secondary | ICD-10-CM | POA: Insufficient documentation

## 2017-04-03 DIAGNOSIS — Z7984 Long term (current) use of oral hypoglycemic drugs: Secondary | ICD-10-CM | POA: Insufficient documentation

## 2017-04-03 DIAGNOSIS — M1990 Unspecified osteoarthritis, unspecified site: Secondary | ICD-10-CM | POA: Insufficient documentation

## 2017-04-03 DIAGNOSIS — J45909 Unspecified asthma, uncomplicated: Secondary | ICD-10-CM | POA: Diagnosis not present

## 2017-04-03 DIAGNOSIS — I509 Heart failure, unspecified: Secondary | ICD-10-CM | POA: Insufficient documentation

## 2017-04-03 DIAGNOSIS — K449 Diaphragmatic hernia without obstruction or gangrene: Secondary | ICD-10-CM | POA: Insufficient documentation

## 2017-04-03 DIAGNOSIS — N202 Calculus of kidney with calculus of ureter: Secondary | ICD-10-CM | POA: Diagnosis present

## 2017-04-03 DIAGNOSIS — Z79899 Other long term (current) drug therapy: Secondary | ICD-10-CM | POA: Insufficient documentation

## 2017-04-03 DIAGNOSIS — Z87442 Personal history of urinary calculi: Secondary | ICD-10-CM | POA: Diagnosis not present

## 2017-04-03 DIAGNOSIS — I110 Hypertensive heart disease with heart failure: Secondary | ICD-10-CM | POA: Diagnosis not present

## 2017-04-03 HISTORY — PX: CYSTOSCOPY/URETEROSCOPY/HOLMIUM LASER/STENT PLACEMENT: SHX6546

## 2017-04-03 HISTORY — PX: HOLMIUM LASER APPLICATION: SHX5852

## 2017-04-03 LAB — GLUCOSE, CAPILLARY: GLUCOSE-CAPILLARY: 99 mg/dL (ref 65–99)

## 2017-04-03 SURGERY — CYSTOSCOPY/URETEROSCOPY/HOLMIUM LASER/STENT PLACEMENT
Anesthesia: General | Laterality: Bilateral

## 2017-04-03 MED ORDER — IOHEXOL 300 MG/ML  SOLN
INTRAMUSCULAR | Status: DC | PRN
  Administered 2017-04-03: 70 mL

## 2017-04-03 MED ORDER — CIPROFLOXACIN HCL 500 MG PO TABS: 500.0000 mg | ORAL_TABLET | Freq: Two times a day (BID) | ORAL | 0 refills | Status: AC

## 2017-04-03 MED ORDER — HYDROCODONE-ACETAMINOPHEN 5-325 MG PO TABS: 1.0000 | ORAL_TABLET | ORAL | 0 refills | Status: DC | PRN

## 2017-04-03 MED ORDER — SODIUM CHLORIDE 0.9 % IR SOLN
Status: DC | PRN
  Administered 2017-04-03: 3000 mL
  Administered 2017-04-03 (×2): 1000 mL

## 2017-04-03 MED ORDER — FENTANYL CITRATE (PF) 250 MCG/5ML IJ SOLN
INTRAMUSCULAR | Status: AC
  Filled 2017-04-03: qty 5

## 2017-04-03 MED ORDER — CIPROFLOXACIN IN D5W 400 MG/200ML IV SOLN
400.0000 mg | INTRAVENOUS | Status: AC
  Administered 2017-04-03: 400 mg via INTRAVENOUS
  Filled 2017-04-03: qty 200

## 2017-04-03 MED ORDER — ONDANSETRON HCL 4 MG/2ML IJ SOLN: 4.0000 mg | Freq: Once | INTRAMUSCULAR | Status: DC | PRN

## 2017-04-03 MED ORDER — LACTATED RINGERS IV SOLN
INTRAVENOUS | Status: DC
  Administered 2017-04-03 (×2): via INTRAVENOUS

## 2017-04-03 MED ORDER — MEPERIDINE HCL 50 MG/ML IJ SOLN: 6.2500 mg | INTRAMUSCULAR | Status: DC | PRN

## 2017-04-03 MED ORDER — PROPOFOL 10 MG/ML IV BOLUS
INTRAVENOUS | Status: DC | PRN
Start: 2017-04-03 — End: 2017-04-03
  Administered 2017-04-03: 90 mg via INTRAVENOUS

## 2017-04-03 MED ORDER — SUGAMMADEX SODIUM 200 MG/2ML IV SOLN
INTRAVENOUS | Status: DC | PRN
  Administered 2017-04-03: 200 mg via INTRAVENOUS

## 2017-04-03 MED ORDER — LIDOCAINE HCL (CARDIAC) 20 MG/ML IV SOLN
INTRAVENOUS | Status: DC | PRN
  Administered 2017-04-03: 80 mg via INTRAVENOUS

## 2017-04-03 MED ORDER — ROCURONIUM BROMIDE 100 MG/10ML IV SOLN
INTRAVENOUS | Status: DC | PRN
  Administered 2017-04-03: 50 mg via INTRAVENOUS

## 2017-04-03 MED ORDER — ONDANSETRON HCL 4 MG/2ML IJ SOLN
INTRAMUSCULAR | Status: DC | PRN
  Administered 2017-04-03: 4 mg via INTRAVENOUS

## 2017-04-03 MED ORDER — FENTANYL CITRATE (PF) 100 MCG/2ML IJ SOLN: 25.0000 ug | INTRAMUSCULAR | Status: DC | PRN

## 2017-04-03 MED ORDER — MIDAZOLAM HCL 5 MG/5ML IJ SOLN
INTRAMUSCULAR | Status: DC | PRN
  Administered 2017-04-03: 2 mg via INTRAVENOUS

## 2017-04-03 MED ORDER — DEXAMETHASONE SODIUM PHOSPHATE 4 MG/ML IJ SOLN
INTRAMUSCULAR | Status: DC | PRN
  Administered 2017-04-03: 4 mg via INTRAVENOUS

## 2017-04-03 MED ORDER — FENTANYL CITRATE (PF) 100 MCG/2ML IJ SOLN
INTRAMUSCULAR | Status: DC | PRN
Start: 2017-04-03 — End: 2017-04-03
  Administered 2017-04-03 (×4): 50 ug via INTRAVENOUS

## 2017-04-03 MED ORDER — FENTANYL CITRATE (PF) 100 MCG/2ML IJ SOLN
INTRAMUSCULAR | Status: AC
  Filled 2017-04-03: qty 2

## 2017-04-03 MED ORDER — PROPOFOL 10 MG/ML IV BOLUS
INTRAVENOUS | Status: AC
  Filled 2017-04-03: qty 20

## 2017-04-03 MED ORDER — MIDAZOLAM HCL 2 MG/2ML IJ SOLN
INTRAMUSCULAR | Status: AC
  Filled 2017-04-03: qty 2

## 2017-04-03 MED ORDER — CEFAZOLIN SODIUM-DEXTROSE 2-4 GM/100ML-% IV SOLN: 2.0000 g | INTRAVENOUS | Status: DC

## 2017-04-03 SURGICAL SUPPLY — 17 items
BAG URO CATCHER STRL LF (MISCELLANEOUS) ×2 IMPLANT
BASKET ZERO TIP NITINOL 2.4FR (BASKET) ×2 IMPLANT
CATH INTERMIT  6FR 70CM (CATHETERS) IMPLANT
CATH URET DUAL LUMEN 6-10FR 50 (CATHETERS) ×2 IMPLANT
CLOTH BEACON ORANGE TIMEOUT ST (SAFETY) ×2 IMPLANT
COVER SURGICAL LIGHT HANDLE (MISCELLANEOUS) ×2 IMPLANT
FIBER LASER TRAC TIP (UROLOGICAL SUPPLIES) ×2 IMPLANT
GLOVE BIO SURGEON STRL SZ7.5 (GLOVE) ×2 IMPLANT
GOWN STRL REUS W/TWL LRG LVL3 (GOWN DISPOSABLE) ×4 IMPLANT
GUIDEWIRE ANG ZIPWIRE 038X150 (WIRE) IMPLANT
GUIDEWIRE STR DUAL SENSOR (WIRE) IMPLANT
MANIFOLD NEPTUNE II (INSTRUMENTS) ×2 IMPLANT
PACK CYSTO (CUSTOM PROCEDURE TRAY) ×2 IMPLANT
SHEATH ACCESS URETERAL 38CM (SHEATH) ×2 IMPLANT
STENT CONTOUR 6FRX24X.038 (STENTS) ×2 IMPLANT
STENT CONTOUR 6FRX28X.038 (STENTS) ×2 IMPLANT
TUBING CONNECTING 10 (TUBING) ×2 IMPLANT

## 2017-04-03 NOTE — Anesthesia Procedure Notes (Signed)
Procedure Name: Intubation Date/Time: 04/03/2017 2:31 PM Performed by: Emerick Weatherly, Virgel Gess Pre-anesthesia Checklist: Patient identified, Emergency Drugs available, Suction available, Patient being monitored and Timeout performed Patient Re-evaluated:Patient Re-evaluated prior to induction Oxygen Delivery Method: Circle system utilized Preoxygenation: Pre-oxygenation with 100% oxygen Induction Type: IV induction Ventilation: Mask ventilation without difficulty Laryngoscope Size: Mac and 4 Grade View: Grade I Tube type: Oral Tube size: 7.5 mm Number of attempts: 1 Airway Equipment and Method: Stylet Placement Confirmation: ETT inserted through vocal cords under direct vision,  positive ETCO2,  CO2 detector and breath sounds checked- equal and bilateral Secured at: 22 cm Tube secured with: Tape Dental Injury: Teeth and Oropharynx as per pre-operative assessment

## 2017-04-03 NOTE — Anesthesia Preprocedure Evaluation (Addendum)
Anesthesia Evaluation  Patient identified by MRN, date of birth, ID band Patient awake    Reviewed: Allergy & Precautions, H&P , NPO status , Patient's Chart, lab work & pertinent test results, reviewed documented beta blocker date and time   Airway Mallampati: II  TM Distance: >3 FB Neck ROM: Full    Dental no notable dental hx. (+) Teeth Intact, Dental Advisory Given   Pulmonary asthma ,    Pulmonary exam normal breath sounds clear to auscultation       Cardiovascular hypertension, Pt. on medications and Pt. on home beta blockers +CHF  + pacemaker + Cardiac Defibrillator  Rhythm:Regular Rate:Normal  Study Conclusions  - Left ventricle: Diffuse hypokinesis worse in the septum and apex.   Poor endocardial definition and no definity used. Suggest MRI to   better evaluate EF. The cavity size was mildly dilated. Wall   thickness was increased in a pattern of mild LVH. Systolic   function was mildly to moderately reduced. The estimated ejection   fraction was in the range of 40% to 45%. Left ventricular   diastolic function parameters were normal. - Left atrium: The atrium was mildly dilated.   Neuro/Psych negative neurological ROS  negative psych ROS   GI/Hepatic negative GI ROS, Neg liver ROS, hiatal hernia, (+)     substance abuse  alcohol use,   Endo/Other  diabetes, Type 2, Oral Hypoglycemic Agents  Renal/GU negative Renal ROS  negative genitourinary   Musculoskeletal  (+) Arthritis , Osteoarthritis,    Abdominal   Peds  Hematology negative hematology ROS (+)   Anesthesia Other Findings   Reproductive/Obstetrics negative OB ROS                            Anesthesia Physical  Anesthesia Plan  ASA: III  Anesthesia Plan: General   Post-op Pain Management:    Induction: Intravenous  PONV Risk Score and Plan: 3 and Ondansetron, Dexamethasone and Diphenhydramine  Airway  Management Planned: Oral ETT  Additional Equipment:   Intra-op Plan:   Post-operative Plan: Extubation in OR  Informed Consent: I have reviewed the patients History and Physical, chart, labs and discussed the procedure including the risks, benefits and alternatives for the proposed anesthesia with the patient or authorized representative who has indicated his/her understanding and acceptance.   Dental advisory given  Plan Discussed with: CRNA, Anesthesiologist and Surgeon  Anesthesia Plan Comments:       Anesthesia Quick Evaluation

## 2017-04-03 NOTE — Op Note (Signed)
Date of procedure: 04/03/17  Preoperative diagnosis:  1. Bilateral nephrolithiasis   Postoperative diagnosis:  1.  Same   Procedure: 1. Cystoscopy 2. Bilateral ureteroscopy 3. Laser lithotripsy 4. Stone basketing 5. Bilateral retrograde pyelogram with interpretation 6. Left ureteral stent exchange 6 French by 28 cm 7. Ureteral stent placement 6 French by 28 cm  Surgeon: Baruch Gouty, MD  Anesthesia: General  Complications: None  Intraoperative findings: he patient had a large number of stones in his left ureterafter undergoing a percutaneous nephrolithotomy recently that removed. There wereresidual stones noted within his left kidney. His right renal calculus was dusted. Bilateral retrograde pyelograms at the end of the procedure showed no filling defects.  EBL: none  Specimens: none. Stone given to family as he had recent stone analysis at time PCNL  Drains: bilateral 6 [place] by 28 cm double-J ureteral stents  Disposition: Stable to the postanesthesia care unit  Indication for procedure: The patient is a 56 y.o. male with history of left PCNL approximate one month ago has no residual stone burden in his ureter as well as a solitary right renal stone and presents today for definitive management.  After reviewing the management options for treatment, the patient elected to proceed with the above surgical procedure(s). We have discussed the potential benefits and risks of the procedure, side effects of the proposed treatment, the likelihood of the patient achieving the goals of the procedure, and any potential problems that might occur during the procedure or recuperation. Informed consent has been obtained.  Description of procedure: The patient was met in the preoperative area. All risks, benefits, and indications of the procedure were described in great detail. The patient consented to the procedure. Preoperative antibiotics were given. The patient was taken to the operative  theater. General anesthesia was induced per the anesthesia service. The patient was then placed in the dorsal lithotomy position and prepped and draped in the usual sterile fashion. A preoperative timeout was called.   A 21 French 30 cystoscope was inserted into the patient's bladder per urethra atraumatically. The left ureteral stent was grasped with flexible graspers and brought to level of the urethral meatus. A sensor wire was exchanged stirrups the level of the renal pelvis under fluoroscopy. The stent was then removed.Semirigid ureteroscope was then inserted into the patient's bladder and the left ureteral orifice was intubated. Within the left ureter proximally 5 stone fragments that were easily removed with a stone basket individually atraumatically. Pan ureteroscopy's point revealed no further ureteral calculi. A sensor wire was then advanced through the ureteroscope and ureteroscope was withdrawn. Over one of the 2 sensor wires the ureteral access sheath was placed under fluoroscopy atraumatically. A flexible ureteroscope was then inserted into the access sheath and advanced into the left renal pelvis. Pan nephroscopy revealed no stone fragments greater than 1 mm. A retrograde pyelogram was used to ensure along with fluoroscopy that all calyces had been examined.At this point and stones were found. The ureteral access sheath wasremoved under direct localization showing no trauma to the ureter and no stones. The cystoscope was then reassembled and a sensor wire and a 6 [place] by 28 cm double-J ureteral stent was placed. The sensor wire was removed. A curl seen in the renal pelvis on fluoroscopy and in the urinary bladder under direct visualization. A sensor wire was then placed into the right ureteral orifice and advanced to level of the right renal pelvis under fluoroscopy. A second sensor wire was then placed with the aid  of a dual-lumen catheter.Access sheath was placed in identical fashion as the  contralateral side. The patient's stone was visible on fluoroscopy. It is located in the mid pole. Here it was approximately 5 mm in cystostomy fragments less than 1 mm. Pan nephroscopy this point revealed no other stomach and stone fragments greater than 1 mm. Retrograde powder was obtained to guide calyces examination with the aid of fluoroscopy to ensure no calyces missed. At this point the ureteral access sheath was removed under direct visualization. No trauma was seen. A 6 French by 28 cm double-J ureteral stent was placed in identical fashion as the contralateral side. The right renal pelvis. At this point all stone fragments were evacuated from the bladder. There were given to the family is urinary had a recent stone analysis. The patient bladder was then drained and he was woken from anesthesia and transferred in stable condition to the post anesthesia care unit.  Plan: The patient will follow-up in one week for bilateral ureteral stent removal. He'll need a ultrasound 1 month later to rule out iatrogenic hydronephrosis. We will need to also discuss stone analysis results with the patient at follow-up.  Baruch Gouty, M.D.

## 2017-04-03 NOTE — Discharge Instructions (Signed)
Cystoscopy, Care After °Refer to this sheet in the next few weeks. These instructions provide you with information about caring for yourself after your procedure. Your health care provider may also give you more specific instructions. Your treatment has been planned according to current medical practices, but problems sometimes occur. Call your health care provider if you have any problems or questions after your procedure. °What can I expect after the procedure? °After the procedure, it is common to have: °· Mild pain when you urinate. Pain should stop within a few minutes after you urinate. This may last for up to 1 week. °· A small amount of blood in your urine for several days. °· Feeling like you need to urinate but producing only a small amount of urine. ° °Follow these instructions at home: ° °Medicines °· Take over-the-counter and prescription medicines only as told by your health care provider. °· If you were prescribed an antibiotic medicine, take it as told by your health care provider. Do not stop taking the antibiotic even if you start to feel better. °General instructions ° °· Return to your normal activities as told by your health care provider. Ask your health care provider what activities are safe for you. °· Do not drive for 24 hours if you received a sedative. °· Watch for any blood in your urine. If the amount of blood in your urine increases, call your health care provider. °· Follow instructions from your health care provider about eating or drinking restrictions. °· If a tissue sample was removed for testing (biopsy) during your procedure, it is your responsibility to get your test results. Ask your health care provider or the department performing the test when your results will be ready. °· Drink enough fluid to keep your urine clear or pale yellow. °· Keep all follow-up visits as told by your health care provider. This is important. °Contact a health care provider if: °· You have pain that  gets worse or does not get better with medicine, especially pain when you urinate. °· You have difficulty urinating. °Get help right away if: °· You have more blood in your urine. °· You have blood clots in your urine. °· You have abdominal pain. °· You have a fever or chills. °· You are unable to urinate. °This information is not intended to replace advice given to you by your health care provider. Make sure you discuss any questions you have with your health care provider. °Document Released: 02/18/2005 Document Revised: 01/07/2016 Document Reviewed: 06/18/2015 °Elsevier Interactive Patient Education © 2017 [employer] ° ° ° ° °General Anesthesia, Adult, Care After °These instructions provide you with information about caring for yourself after your procedure. Your health care provider may also give you more specific instructions. Your treatment has been planned according to current medical practices, but problems sometimes occur. Call your health care provider if you have any problems or questions after your procedure. °What can I expect after the procedure? °After the procedure, it is common to have: °· Vomiting. °· A sore throat. °· Mental slowness. ° °It is common to feel: °· Nauseous. °· Cold or shivery. °· Sleepy. °· Tired. °· Sore or achy, even in parts of your body where you did not have surgery. ° °Follow these instructions at home: °For at least 24 hours after the procedure: °· Do not: °? Participate in activities where you could fall or become injured. °? Drive. °? Use heavy machinery. °? Drink alcohol. °? Take sleeping pills or medicines   that cause drowsiness. °? Make important decisions or sign legal documents. °? Take care of children on your own. °· Rest. °Eating and drinking °· If you vomit, drink water, juice, or soup when you can drink without vomiting. °· Drink enough fluid to keep your urine clear or pale yellow. °· Make sure you have little or no nausea before eating solid foods. °· Follow  the diet recommended by your health care provider. °General instructions °· Have a responsible adult stay with you until you are awake and alert. °· Return to your normal activities as told by your health care provider. Ask your health care provider what activities are safe for you. °· Take over-the-counter and prescription medicines only as told by your health care provider. °· If you smoke, do not smoke without supervision. °· Keep all follow-up visits as told by your health care provider. This is important. °Contact a health care provider if: °· You continue to have nausea or vomiting at home, and medicines are not helpful. °· You cannot drink fluids or start eating again. °· You cannot urinate after 8-12 hours. °· You develop a skin rash. °· You have fever. °· You have increasing redness at the site of your procedure. °Get help right away if: °· You have difficulty breathing. °· You have chest pain. °· You have unexpected bleeding. °· You feel that you are having a life-threatening or urgent problem. °This information is not intended to replace advice given to you by your health care provider. Make sure you discuss any questions you have with your health care provider. °Document Released: 11/07/2000 Document Revised: 01/04/2016 Document Reviewed: 07/16/2015 °Elsevier Interactive Patient Education © 2018 [employer] ° ° °

## 2017-04-03 NOTE — H&P (Signed)
CC: I have kidney stones.  HPI: Justin Bernard is a 56 year-old male established patient who is here for renal calculi.  The problem is on both sides. This is not his first kidney stone. He has had more than 5 stones prior to getting this one. He is currently having flank pain. He denies having back pain, groin pain, nausea, vomiting, fever, and chills. He has not caught a stone in his urine strainer since his symptoms began.   He has never had surgical treatment for calculi in the past.   The patient has approximately 2 weeks status post left percutaneous nephrolithotomy. Post procedure CT does show a 5 mm stone in the left lower pole which was able to be addressed during PCNL dislocation. There is also approximately 5 mm stone in the proximal ureter with stent in place. He also has nonobstructing stones on the right side with the largest being 5 mm.   Patient has done well since surgery with no complaints. His pain. He is not bothered by his stump. No dysuria. No drainage from his incision.     ALLERGIES: No Allergies    MEDICATIONS: Benadryl Allergy  Folic Acid 1 mg tablet  Furosemide 40 mg tablet  Gabapentin 600 mg tablet  Glipizide Er 5 mg tablet, extended release 24 hr  Klor-Con M20 20 meq tablet, ext release, particles/crystals  Proair Hfa 90 mcg hfa aerosol with adapter  Vitamin B-12 1,000 mcg tablet, extended release     REVIEW OF SYSTEMS:    GU Review Male:   Patient reports frequent urination. Patient denies hard to postpone urination, burning/ pain with urination, get up at night to urinate, leakage of urine, stream starts and stops, trouble starting your stream, have to strain to urinate , erection problems, and penile pain.  Gastrointestinal (Upper):   Patient denies nausea, vomiting, and indigestion/ heartburn.  Gastrointestinal (Lower):   Patient denies diarrhea and constipation.  Constitutional:   Patient denies fever, night sweats, weight loss, and fatigue.  Skin:    Patient denies skin rash/ lesion and itching.  Eyes:   Patient denies blurred vision and double vision.  Ears/ Nose/ Throat:   Patient denies sore throat and sinus problems.  Hematologic/Lymphatic:   Patient denies swollen glands and easy bruising.  Cardiovascular:   Patient denies leg swelling and chest pains.  Respiratory:   Patient denies cough and shortness of breath.  Endocrine:   Patient denies excessive thirst.  Musculoskeletal:   Patient denies back pain and joint pain.  Neurological:   Patient denies headaches and dizziness.  Psychologic:   Patient denies anxiety and depression.   Notes: pt states he sees a little blood in urine    VITAL SIGNS:      03/06/2017 03:42 PM  Weight 219 lb / 99.34 kg  Height 76.5 in / 194.31 cm  BP 124/75 mmHg  Pulse 77 /min  BMI 26.3 kg/m   MULTI-SYSTEM PHYSICAL EXAMINATION:    Constitutional: Well-nourished. No physical deformities. Normally developed. Good grooming.  Neck: Neck symmetrical, not swollen. Normal tracheal position.  Respiratory: No labored breathing, no use of accessory muscles.   Cardiovascular: Normal temperature, normal extremity pulses, no swelling, no varicosities.  Skin: No paleness, no jaundice, no cyanosis. No lesion, no ulcer, no rash.  Gastrointestinal: No mass, no tenderness, no rigidity, non obese abdomen.  Eyes: Normal conjunctivae. Normal eyelids.  Ears, Nose, Mouth, and Throat: Left ear no scars, no lesions, no masses. Right ear no scars, no lesions, no  masses. Nose no scars, no lesions, no masses. Normal hearing. Normal lips.  Musculoskeletal: INC c/d/i from L PCNL     PROCEDURES:          Urinalysis w/Scope Dipstick Dipstick Cont'd Micro  Color: Red Bilirubin: Neg WBC/hpf: 10 - 20/hpf  Appearance: Cloudy Ketones: Neg RBC/hpf: >60/hpf  Specific Gravity: 1.020 Blood: 3+ Bacteria: Few (10-25/hpf)  pH: 6.0 Protein: 2+ Cystals: NS (Not Seen)  Glucose: Neg Urobilinogen: 0.2 Casts: NS (Not Seen)    Nitrites:  Neg Trichomonas: Not Present    Leukocyte Esterase: 2+ Mucous: Not Present      Epithelial Cells: 0 - 5/hpf      Yeast: NS (Not Seen)      Sperm: Not Present    ASSESSMENT:      ICD-10 Details  1 GU:   Renal calculus - N20.0    PLAN:           Orders Labs Urine Culture          Schedule Procedure: Unspecified Date - Cysto Uretero Lithotripsy - 69629, bilateral          Document Letter(s):  Created for Patient: Clinical Summary   Created for Jarrett Soho    The patient and I talked about surgical treatment for their kidney stones. Etiologies of kidney stones including dehydration, poor fluid intake, intake of well water, congenital renal disease, previous bowel surgery, idiopathic, and others were discussed with the patient.   Metabolic evaluation of kidney stone disease was discussed with the patient. Alternative treatment options including increased water intake, increased lemonade intake, and dietary moderation with calcium and oxalate containing foods were discussed with the patient in detail.   The risks, benefits, and some of the possible complications of surgical treatments including cystoscopy, ureteroscopy, renoscopy, laser lithotripsy, extracorporeal shock wave lithotripsy, stent insertion and others were discussed with the patient. All questions were answered.  The patient gave fully informed consent to proceed with a ureteroscopy with or without laser lithotripsy with stone extraction for the treatment of their kidney stones.   The patient was given instructions to call for abdominal pain, pelvic pain, perirectal pain, nausea, vomiting, diarrhea, fever over 100 degrees F, chills, hematuria, dysuria, frequency, urgency, or urge incontinence.         Notes:   I discussed the patient that he has the residual stone burden that we knew about the time the CT scan postoperatively. We discussed left ureteroscopy with a goal of clearing the residual stone in his ureter and  left kidney. He also has stones up to 5 mm on the right side that we could address as well during this procedure to save him future operations. He is agreeable to proceeding with this. We'll wait a few more weeks to schedule this procedure then proceeded with cystoscopy and bilateral ureteroscopy.

## 2017-04-03 NOTE — Transfer of Care (Signed)
Immediate Anesthesia Transfer of Care Note  Patient: Justin Bernard  Procedure(s) Performed: Procedure(s): CYSTOSCOPY/URETEROSCOPY/HOLMIUM LASER/LEFT STENT REPLACEMENT AND RIGHT   STENT PLACEMENT (Bilateral) HOLMIUM LASER APPLICATION (Bilateral)  Patient Location: PACU  Anesthesia Type:General  Level of Consciousness: awake, alert , oriented and patient cooperative  Airway & Oxygen Therapy: Patient Spontanous Breathing and Patient connected to face mask oxygen  Post-op Assessment: Report given to RN, Post -op Vital signs reviewed and stable and Patient moving all extremities X 4  Post vital signs: stable  Last Vitals:  Vitals:   04/03/17 1230  BP: (!) 127/96  Pulse: 74  Resp: 18  Temp: 36.7 C  SpO2: 99%    Last Pain:  Vitals:   04/03/17 1234  TempSrc:   PainSc: 4       Patients Stated Pain Goal: 3 (04/03/17 1234)  Complications: No apparent anesthesia complications

## 2017-04-04 NOTE — Anesthesia Postprocedure Evaluation (Signed)
Anesthesia Post Note  Patient: Justin Bernard  Procedure(s) Performed: Procedure(s) (LRB): CYSTOSCOPY/URETEROSCOPY/HOLMIUM LASER/LEFT STENT REPLACEMENT AND RIGHT   STENT PLACEMENT (Bilateral) HOLMIUM LASER APPLICATION (Bilateral)     Patient location during evaluation: PACU Anesthesia Type: General Level of consciousness: sedated Pain management: pain level controlled Vital Signs Assessment: post-procedure vital signs reviewed and stable Respiratory status: spontaneous breathing and respiratory function stable Cardiovascular status: stable Anesthetic complications: no    Last Vitals:  Vitals:   04/03/17 1628 04/03/17 1724  BP: 120/63 117/78  Pulse: 72 72  Resp: 16 18  Temp: 36.6 C 36.5 C  SpO2: 99% 100%    Last Pain:  Vitals:   04/03/17 1724  TempSrc: Oral  PainSc: 3    Pain Goal: Patients Stated Pain Goal: 3 (04/03/17 1234)               Kialee Kham DANIEL

## 2017-04-13 ENCOUNTER — Ambulatory Visit (INDEPENDENT_AMBULATORY_CARE_PROVIDER_SITE_OTHER): Payer: 59 | Admitting: *Deleted

## 2017-04-13 ENCOUNTER — Telehealth (HOSPITAL_COMMUNITY): Payer: Self-pay

## 2017-04-13 ENCOUNTER — Telehealth: Payer: Self-pay | Admitting: Cardiology

## 2017-04-13 DIAGNOSIS — I5022 Chronic systolic (congestive) heart failure: Secondary | ICD-10-CM | POA: Diagnosis not present

## 2017-04-13 NOTE — Telephone Encounter (Signed)
LMOVM reminding pt to send remote transmission.   

## 2017-04-13 NOTE — Telephone Encounter (Signed)
Pt. Called requesting clearance for cataract surgery that is scheduled today. Form was signed and faxed to [phone]. Called pt to confirm fax, but no answer left VM.

## 2017-04-14 NOTE — Progress Notes (Signed)
Remote ICD transmission.   

## 2017-04-20 ENCOUNTER — Ambulatory Visit (HOSPITAL_COMMUNITY)
Admission: RE | Admit: 2017-04-20 | Discharge: 2017-04-20 | Disposition: A | Discharge status: Home / Self Care | Payer: 59 | Source: Ambulatory Visit | Attending: Cardiology | Admitting: Cardiology

## 2017-04-20 VITALS — BP 119/73 | HR 78 | Wt 227.5 lb

## 2017-04-20 DIAGNOSIS — Z79899 Other long term (current) drug therapy: Secondary | ICD-10-CM | POA: Insufficient documentation

## 2017-04-20 DIAGNOSIS — Z7984 Long term (current) use of oral hypoglycemic drugs: Secondary | ICD-10-CM | POA: Insufficient documentation

## 2017-04-20 DIAGNOSIS — I5022 Chronic systolic (congestive) heart failure: Secondary | ICD-10-CM | POA: Insufficient documentation

## 2017-04-20 DIAGNOSIS — E119 Type 2 diabetes mellitus without complications: Secondary | ICD-10-CM | POA: Insufficient documentation

## 2017-04-20 DIAGNOSIS — I951 Orthostatic hypotension: Secondary | ICD-10-CM | POA: Insufficient documentation

## 2017-04-20 DIAGNOSIS — F1010 Alcohol abuse, uncomplicated: Secondary | ICD-10-CM | POA: Diagnosis not present

## 2017-04-20 DIAGNOSIS — I429 Cardiomyopathy, unspecified: Secondary | ICD-10-CM | POA: Diagnosis not present

## 2017-04-20 MED ORDER — LISINOPRIL 2.5 MG PO TABS: 2.5000 mg | ORAL_TABLET | Freq: Every day | ORAL | 3 refills | Status: DC

## 2017-04-20 MED ORDER — FUROSEMIDE 40 MG PO TABS: 40.0000 mg | ORAL_TABLET | Freq: Every day | ORAL | 3 refills | Status: DC

## 2017-04-20 NOTE — Progress Notes (Signed)
Advanced Heart Failure Clinic Note   Patient ID: Justin Bernard, male   DOB: 05/10/1961, 55 y.o.   MRN: [id number]  PCP: Courtney Wharton, PA-C with [facility]: Dr. McLean  55 yo with history of nonischemic cardiomyopathy.    CHF was first noted in 07/2010 per patient when he developed dyspnea. He was seen at [facility]. Of note, mother had CHF diagnosis around 50 and grandfather had CHF diagnosis around 50. Pt's disease thought to be NICM with clean cath as below. 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  Initially, LVAD and transplant were considered, but EF improved greatly on medical management as echoes below demonstrate. He is s/p [employer] ICD implant 08/2010.   Most recent echo in 1/18 with EF 40-45%, diffuse hypokinesis.   He recently had lithotripsy for kidney stone. This seems to be resolved.  Weight has been stable at home.  No dyspnea with his normal activities. No chest pain.  No lightheadedness.  No orthopnea/PND.  SBP running 115-120 on checks at home.    Labs (12/16): K 3.5, creatinine 0.82, HCT 0.82 Labs (3/17): K 3.7, creatinine 0.67 Labs (02/2017): K 3.8, creatinine 0.91 Labs (8/18): K 3.8, creatinine 1.05, hgb 11.4  SH: He lives with his 2 nephews and son in [place].  He teaches at a university.   FH: Mother with CHF around 50, grandfather with CHF around 50.   ROS: All systems reviewed and negative except as per HPI.   PMH:  1. Chronic systolic CHF:  Nonischemic cardiomyopathy, initially followed at [facility]. [employer] ICD.  - Cardiolite 08/13/10 [place], [place]: areas or reversibility in anterior wall and lesser extent inferior wall. Dilated LV with EF 15% and severe hypokinesis. - LHC 08/31/10 [place]: Right dominant. Left main free of disease, branches into LAD and cicrumflex. Large caliber vessels with no significant  stenosis. No intimal irregularities in RCA. Elevated LVEDP at 40 - ECHO 08/12/2010 [place], [place]: EF <24% - ECHO 09/27/2010 [place], [place]: EF 15% with moderate RVE with marked dysfunction, moderate MR/TR, trace AR, mild PR, moderate LV dilation, moderate RV dilation, Severe LAE, mild RAE.  - ECHO 01/08/2012 [place], [place]: EF <25%, Grade 1 DD, MR/TR significantly improved. - ECHO 09/21/2011 [place]: EF 45-55%, Grade 1 DD, RV mildly dilated and reduced, Trace MR/PR/TR, no AR. - ECHO 03/21/2012 [place]: EF 45%, Grade 1 DD, Trace MR,PR,TR.  - ECHO 12/16: EF 50-55%, mild LVH.  - ECHO 1/18: EF 40-45%, diffuse HK, mildly dilated LV, mild diffuse hypokinesis.  2. Type II diabetes 3. Right humeral fracture (12/16).  4. Orthostatic hypotension.  5. Nephrolithiasis: s/p lithotripsy 8/18.   Current Outpatient Prescriptions  Medication Sig Dispense Refill  . albuterol (PROVENTIL HFA;VENTOLIN HFA) 108 (90 Base) MCG/ACT inhaler Inhale 2 puffs into the lungs every 6 (six) hours as needed for wheezing or shortness of breath.    . carvedilol (COREG) 6.25 MG tablet Take 1 tablet (6.25 mg total) by mouth 2 (two) times daily with a meal. 180 tablet 3  . furosemide (LASIX) 40 MG tablet Take 1 tablet (40 mg total) by mouth daily. Take twice a day for weight gain of 2 lbs in 1 day, or 3 lbs in a week 40 tablet 3  . glipiZIDE (GLUCOTROL XL) 5 MG 24 hr tablet Take 5 mg by mouth daily with breakfast.     . glucose monitoring kit (FREESTYLE) monitoring kit 1   each by Does not apply route 4 (four) times daily - after meals and at bedtime. 1 month Diabetic Testing Supplies for QAC-QHS accuchecks. 1 each 1  . hydrocortisone cream 1 % Apply 1 application topically 2 (two) times daily as needed for itching.    . ibuprofen (ADVIL,MOTRIN) 200 MG tablet Take 400-600 mg by mouth every 8 (eight) hours as needed (for pain/headaches.).    . Multiple Vitamin (MULTIVITAMIN WITH MINERALS) TABS tablet Take 1 tablet by mouth daily.  30 tablet 0  . potassium chloride SA (K-DUR,KLOR-CON) 20 MEQ tablet Take 20 mEq by mouth 2 (two) times daily.     . spironolactone (ALDACTONE) 25 MG tablet Take 25 mg by mouth daily.    . lisinopril (PRINIVIL,ZESTRIL) 2.5 MG tablet Take 1 tablet (2.5 mg total) by mouth daily. 30 tablet 3   No current facility-administered medications for this encounter.    BP 119/73   Pulse 78   Wt 227 lb 8 oz (103.2 kg)   SpO2 98%   BMI 27.69 kg/m    Wt Readings from Last 3 Encounters:  04/20/17 227 lb 8 oz (103.2 kg)  04/03/17 226 lb (102.5 kg)  03/30/17 226 lb (102.5 kg)     General: NAD Neck: No JVD, no thyromegaly or thyroid nodule.  Lungs: Clear to auscultation bilaterally with normal respiratory effort. CV: Nondisplaced PMI.  Heart regular S1/S2, no S3/S4, no murmur.  Trace ankle edema.  No carotid bruit.  Normal pedal pulses.  Abdomen: Soft, nontender, no hepatosplenomegaly, no distention.  Skin: Intact without lesions or rashes.  Neurologic: Alert and oriented x 3.  Psych: Normal affect. Extremities: No clubbing or cyanosis.  HEENT: Normal.   Assessment/Plan: 1. Chronic systolic CHF: Nonischemic cardiomyopathy.  Most recent echo in 1/18 with EF 40-45%, diffuse hypokinesis.  EF had been lower 2011-2013.  [employer] ICD.  NYHA class I-II symptoms. He is not volume overloaded on exam.      - Continue lasix 40 mg daily.  He will take 40 mg bid x 2 days prn 2 lb weight gain in 1 day or 3 lb weight gain in 1 week.  - Continue Coreg 6.25 mg bid.  - Continue spironolactone 25 mg daily.    - Add lisinopril 2.5 mg qhs, BMET in 10 days.  - Repeat echo in 1/19, will need contrast as it is a difficult study (no MRI with ICD).     2. Orthostatic hypotension: Resolved 3. ETOH abuse: Encouraged continued abstinence.  4. Chronic venous stasis: Stable.   Followup in 3 months.   Dalton McLean, MD  04/20/2017 

## 2017-04-20 NOTE — Patient Instructions (Addendum)
Start Lisinipril 2.5 mg (1 tab) daily  Continue to take Furesimide 40 mg daily.           Take Furosemide 40 mg, twice a day for weight gain of 2 lbs in 1 day, or 3 lbs in a week  Your physician recommends that you return for lab work in: 10 days  Your physician recommends that you schedule a follow-up appointment in: 3 months

## 2017-04-25 ENCOUNTER — Encounter: Payer: Self-pay | Admitting: Cardiology

## 2017-05-01 ENCOUNTER — Ambulatory Visit (HOSPITAL_COMMUNITY)
Admission: RE | Admit: 2017-05-01 | Discharge: 2017-05-01 | Disposition: A | Discharge status: Home / Self Care | Payer: 59 | Source: Ambulatory Visit | Attending: Cardiology | Admitting: Cardiology

## 2017-05-01 DIAGNOSIS — I5022 Chronic systolic (congestive) heart failure: Secondary | ICD-10-CM | POA: Insufficient documentation

## 2017-05-01 LAB — BASIC METABOLIC PANEL
Anion gap: 3 — ABNORMAL LOW (ref 5–15)
BUN: 24 mg/dL — ABNORMAL HIGH (ref 6–20)
CALCIUM: 8.5 mg/dL — AB (ref 8.9–10.3)
CO2: 23 mmol/L (ref 22–32)
CREATININE: 1.16 mg/dL (ref 0.61–1.24)
Chloride: 112 mmol/L — ABNORMAL HIGH (ref 101–111)
GFR calc non Af Amer: >60 mL/min (ref >60)
Glucose, Bld: 103 mg/dL — ABNORMAL HIGH (ref 65–99)
Potassium: 3.6 mmol/L (ref 3.5–5.1)
SODIUM: 138 mmol/L (ref 135–145)

## 2017-05-09 LAB — CUP PACEART REMOTE DEVICE CHECK
Battery Remaining Percentage: 84 %
Brady Statistic RV Percent Paced: 0 %
Date Time Interrogation Session: 20180831140600
HighPow Impedance: 53 Ohm
Implantable Lead Model: 296
Implantable Pulse Generator Implant Date: 20120222
Lead Channel Impedance Value: 442 Ohm
Lead Channel Setting Pacing Pulse Width: 0.5 ms
Lead Channel Setting Sensing Sensitivity: 0.6 mV
MDC IDC LEAD IMPLANT DT: 20120222
MDC IDC LEAD LOCATION: 753860
MDC IDC LEAD SERIAL: 100951
MDC IDC MSMT BATTERY REMAINING LONGEVITY: 78 mo
MDC IDC MSMT LEADCHNL RV PACING THRESHOLD AMPLITUDE: 1.1 V
MDC IDC MSMT LEADCHNL RV PACING THRESHOLD PULSEWIDTH: 0.5 ms
MDC IDC SET LEADCHNL RV PACING AMPLITUDE: 2.5 V
Pulse Gen Serial Number: 100238

## 2017-05-12 ENCOUNTER — Telehealth (HOSPITAL_COMMUNITY): Payer: Self-pay | Admitting: *Deleted

## 2017-05-12 NOTE — Telephone Encounter (Signed)
Received surgical clearance from [facility].  Patient is cleared from a cardiac standpoint.    Clearance faxed today to [phone]

## 2017-06-12 ENCOUNTER — Other Ambulatory Visit (HOSPITAL_COMMUNITY): Payer: Self-pay | Admitting: *Deleted

## 2017-06-12 MED ORDER — LISINOPRIL 2.5 MG PO TABS: 2.5000 mg | ORAL_TABLET | Freq: Every day | ORAL | 3 refills | Status: DC

## 2017-07-13 ENCOUNTER — Telehealth: Payer: Self-pay | Admitting: Cardiology

## 2017-07-13 ENCOUNTER — Ambulatory Visit (INDEPENDENT_AMBULATORY_CARE_PROVIDER_SITE_OTHER): Payer: 59 | Admitting: *Deleted

## 2017-07-13 DIAGNOSIS — I5022 Chronic systolic (congestive) heart failure: Secondary | ICD-10-CM

## 2017-07-13 NOTE — Telephone Encounter (Signed)
Spoke with pt and reminded pt of remote transmission that is due today. Pt verbalized understanding.   

## 2017-07-14 ENCOUNTER — Encounter: Payer: Self-pay | Admitting: Cardiology

## 2017-07-14 NOTE — Progress Notes (Signed)
Remote ICD transmission.   

## 2017-07-21 ENCOUNTER — Encounter (HOSPITAL_COMMUNITY): Payer: Self-pay | Admitting: Cardiology

## 2017-07-25 LAB — CUP PACEART REMOTE DEVICE CHECK
HIGH POWER IMPEDANCE MEASURED VALUE: 50 Ohm
Implantable Lead Location: 753860
Implantable Lead Model: 296
Implantable Pulse Generator Implant Date: 20120222
Lead Channel Impedance Value: 398 Ohm
Lead Channel Sensing Intrinsic Amplitude: 5.7 mV
Lead Channel Setting Pacing Amplitude: 2.5 V
Lead Channel Setting Pacing Pulse Width: 0.5 ms
Lead Channel Setting Sensing Sensitivity: 0.6 mV
MDC IDC LEAD IMPLANT DT: 20120222
MDC IDC LEAD SERIAL: 100951
MDC IDC MSMT BATTERY REMAINING LONGEVITY: 78 mo
MDC IDC SESS DTM: 20181211151831
MDC IDC STAT BRADY RV PERCENT PACED: 0 %
Pulse Gen Serial Number: 100238

## 2017-09-06 ENCOUNTER — Other Ambulatory Visit (HOSPITAL_COMMUNITY): Payer: Self-pay | Admitting: Student

## 2017-10-10 ENCOUNTER — Other Ambulatory Visit (HOSPITAL_COMMUNITY): Payer: Self-pay | Admitting: Student

## 2017-10-12 ENCOUNTER — Ambulatory Visit (INDEPENDENT_AMBULATORY_CARE_PROVIDER_SITE_OTHER): Payer: Self-pay | Admitting: *Deleted

## 2017-10-12 DIAGNOSIS — I5022 Chronic systolic (congestive) heart failure: Secondary | ICD-10-CM

## 2017-10-12 MED ORDER — CARVEDILOL 6.25 MG PO TABS: 6.2500 mg | ORAL_TABLET | Freq: Two times a day (BID) | ORAL | 3 refills | Status: DC

## 2017-10-13 ENCOUNTER — Encounter: Payer: Self-pay | Admitting: Cardiology

## 2017-10-13 NOTE — Progress Notes (Signed)
Remote ICD transmission.   

## 2017-10-29 LAB — CUP PACEART REMOTE DEVICE CHECK
Date Time Interrogation Session: 20190317101259
Implantable Lead Model: 296
Implantable Lead Serial Number: 100951
MDC IDC LEAD IMPLANT DT: 20120222
MDC IDC LEAD LOCATION: 753860
MDC IDC PG IMPLANT DT: 20120222
MDC IDC PG SERIAL: 100238
MDC IDC SET LEADCHNL RV PACING AMPLITUDE: 2.5 V
MDC IDC SET LEADCHNL RV PACING PULSEWIDTH: 0.5 ms
MDC IDC SET LEADCHNL RV SENSING SENSITIVITY: 0.6 mV

## 2018-01-11 ENCOUNTER — Ambulatory Visit (INDEPENDENT_AMBULATORY_CARE_PROVIDER_SITE_OTHER): Payer: Self-pay | Admitting: *Deleted

## 2018-01-11 ENCOUNTER — Telehealth: Payer: Self-pay

## 2018-01-11 DIAGNOSIS — I5022 Chronic systolic (congestive) heart failure: Secondary | ICD-10-CM

## 2018-01-11 NOTE — Telephone Encounter (Signed)
LMOVM reminding pt to send remote transmission.   

## 2018-01-12 NOTE — Progress Notes (Signed)
Remote ICD transmission.   

## 2018-01-15 ENCOUNTER — Encounter: Payer: Self-pay | Admitting: Cardiology

## 2018-01-25 LAB — CUP PACEART REMOTE DEVICE CHECK
Battery Remaining Percentage: 72 %
Brady Statistic RV Percent Paced: 0 %
Date Time Interrogation Session: 20190531112900
HIGH POWER IMPEDANCE MEASURED VALUE: 55 Ohm
Lead Channel Impedance Value: 399 Ohm
Lead Channel Setting Pacing Amplitude: 2.5 V
Lead Channel Setting Pacing Pulse Width: 0.5 ms
MDC IDC LEAD IMPLANT DT: 20120222
MDC IDC LEAD LOCATION: 753860
MDC IDC LEAD SERIAL: 100951
MDC IDC MSMT BATTERY REMAINING LONGEVITY: 66 mo
MDC IDC MSMT LEADCHNL RV PACING THRESHOLD AMPLITUDE: 1.1 V
MDC IDC MSMT LEADCHNL RV PACING THRESHOLD PULSEWIDTH: 0.5 ms
MDC IDC PG IMPLANT DT: 20120222
MDC IDC SET LEADCHNL RV SENSING SENSITIVITY: 0.6 mV
Pulse Gen Serial Number: 100238

## 2018-04-12 ENCOUNTER — Ambulatory Visit (INDEPENDENT_AMBULATORY_CARE_PROVIDER_SITE_OTHER): Payer: Self-pay | Admitting: *Deleted

## 2018-04-12 DIAGNOSIS — I5022 Chronic systolic (congestive) heart failure: Secondary | ICD-10-CM

## 2018-04-12 NOTE — Progress Notes (Signed)
Remote ICD transmission.   

## 2018-05-09 LAB — CUP PACEART REMOTE DEVICE CHECK
Date Time Interrogation Session: 20190925154711
Implantable Lead Location: 753860
Implantable Lead Model: 296
Implantable Pulse Generator Implant Date: 20120222
MDC IDC LEAD IMPLANT DT: 20120222
MDC IDC LEAD SERIAL: 100951
MDC IDC PG SERIAL: 100238

## 2018-05-29 ENCOUNTER — Encounter: Payer: Self-pay | Admitting: Internal Medicine

## 2018-05-29 ENCOUNTER — Ambulatory Visit (INDEPENDENT_AMBULATORY_CARE_PROVIDER_SITE_OTHER): Payer: 59 | Admitting: Internal Medicine

## 2018-05-29 DIAGNOSIS — M009 Pyogenic arthritis, unspecified: Secondary | ICD-10-CM

## 2018-05-29 NOTE — Progress Notes (Addendum)
[name] for Infectious Disease  Reason for Consult: Right shoulder infection Referring Provider: Dr. Altamese [place]  Assessment: I strongly suspect that he has osteomyelitis with adjacent abscess formation leading to his chronic draining sinus.  I agree with contrasted CT scan and consideration of aspiration or debridement to obtain accurate, deep specimens for culture to help guide IV antibiotic therapy.  Plan: 1. Contrasted CT scan (I will discuss with Dr. Marcelino Scot) 2. CBC, BMP, ESR and CRP today  3. Recommend obtaining deep specimens for Gram stain, aerobic and anaerobic culture before starting IV antibiotics 4. Obtain records regarding recent antibiotic therapy from his [facility]  Addendum: I spoke with Dr. Marcelino Scot today.  He will order the contrasted CT scan.  Patient Active Problem List   Diagnosis Date Noted  . Infection of shoulder ([place]) 05/29/2018    Priority: High  . S/P hardware removal 04/29/2016    Priority: High  . Fracture closed, humerus 07/17/2015    Priority: High  . ICD (implantable cardioverter-defibrillator) in place 08/04/2015    Priority: Medium  . Nephrolithiasis 02/20/2017  . ETOH abuse 09/22/2016  . Orthostatic hypotension 09/10/2016  . Urinary retention 09/10/2016  . Chronic venous stasis dermatitis 09/08/2016  . Diabetes mellitus type II, non insulin dependent (Justin Bernard) 08/13/2016  . Painful orthopaedic hardware (Justin Bernard) 04/29/2016  . Chronic systolic CHF (congestive heart failure), NYHA class 2 (Justin Bernard) 07/17/2015    Patient's Medications  New Prescriptions   No medications on file  Previous Medications   ALBUTEROL (PROVENTIL HFA;VENTOLIN HFA) 108 (90 BASE) MCG/ACT INHALER    Inhale 2 puffs into the lungs every 6 (six) hours as needed for wheezing or shortness of breath.   CARVEDILOL (COREG) 6.25 MG TABLET    Take 1 tablet (6.25 mg total) by mouth 2 (two) times daily with a meal.   FUROSEMIDE (LASIX) 40 MG TABLET    TAKE 1 TABLET BY MOUTH  EVERY DAY   GLIPIZIDE (GLUCOTROL XL) 5 MG 24 HR TABLET    Take 5 mg by mouth daily with breakfast.    GLUCOSE MONITORING KIT (FREESTYLE) MONITORING KIT    1 each by Does not apply route 4 (four) times daily - after meals and at bedtime. 1 month Diabetic Testing Supplies for QAC-QHS accuchecks.   HYDROCORTISONE CREAM 1 %    Apply 1 application topically 2 (two) times daily as needed for itching.   IBUPROFEN (ADVIL,MOTRIN) 200 MG TABLET    Take 400-600 mg by mouth every 8 (eight) hours as needed (for pain/headaches.).   LISINOPRIL (PRINIVIL,ZESTRIL) 2.5 MG TABLET    Take 1 tablet (2.5 mg total) by mouth daily.   MULTIPLE VITAMIN (MULTIVITAMIN WITH MINERALS) TABS TABLET    Take 1 tablet by mouth daily.   POTASSIUM CHLORIDE SA (K-DUR,KLOR-CON) 20 MEQ TABLET    Take 20 mEq by mouth 2 (two) times daily.    SPIRONOLACTONE (ALDACTONE) 25 MG TABLET    Take 25 mg by mouth daily.  Modified Medications   No medications on file  Discontinued Medications   FUROSEMIDE (LASIX) 40 MG TABLET    Take 1 tablet (40 mg total) by mouth daily. Take twice a day for weight gain of 2 lbs in 1 day, or 3 lbs in a week    HPI: Justin Bernard is a 57 y.o. male who suffered a right proximal humerus fracture in December 2016.  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  He underwent osteotomy with hardware removal on 04/29/2016.  The operative report does not mention signs of infection.  Operative Gram stain showed rare gram-negative rods but cultures were no growth.  The discharge summary does not mention any postop antibiotics but he recalls being on an oral antibiotic for 7 to 10 days.  He healed well after that surgery and had no further problems until he was visiting [facility] in July.  He was swimming in the pool and noted that his right arm was a little more stiff than normal.  It was not painful, red or more swollen than normal.  He woke up on the  morning of 03/07/2018 with his T-shirt stuck to his shoulder.  He has had a chronic draining sinus ever since that time.  He was seen by his PCP at [facility].  A culture of wound drainage on 03/12/2018 grew normal skin flora including MSSA.  He received 2 short courses of oral antibiotics.  He knows that there were 2 different antibiotics but does not know the names.  He says that the drainage improved while he was on antibiotics but has worsened since stopping antibiotics about 6 weeks ago.  He was referred back to Dr. Altamese Morrison, his orthopedic surgeon, who saw him on 04/25/2018.  Sed rate and C-reactive protein were normal.  Mr. Allinson tells me that a plain x-ray did not show any obvious new changes.  Dr. Carlean Jews note indicates that he was planning a contrasted CT scan (MRI could not be obtained because Mr. Justin Bernard has an AICD) but it does not appear that the CT scan has been ordered.  Review of Systems: Review of Systems  Constitutional: Negative for chills, diaphoresis, fever and malaise/fatigue.  Gastrointestinal: Negative for abdominal pain, diarrhea, nausea and vomiting.  Musculoskeletal: Negative for joint pain.       Chronic draining sinus as noted in HPI.      Past Medical History:  Diagnosis Date  . AICD (automatic cardioverter/defibrillator) present   . Arthritis    L hip   . Asthma    pt. uses albuterol inhaler in the spring for enviromental allergies   . Cataract    LEFT EYE  . CHF (congestive heart failure) (Justin Bernard)   . Chronic systolic heart failure (HCC)    NICM, previous heart transplant candidate with EF 10%  . Diabetes mellitus without complication (Justin Bernard)    TYPE 2  . Family history of adverse reaction to anesthesia    sister- headache & N&V am and mother  . History of hiatal hernia   . History of kidney stones   . Hypertension   . Neuromuscular disorder (Justin Bernard)    essential tremor  . Shortness of breath dyspnea    WITH EXERTION    Social History   Tobacco Use  .  Smoking status: Never Smoker  . Smokeless tobacco: Never Used  Substance Use Topics  . Alcohol use: No  . Drug use: No    Family History  Problem Relation Age of Onset  . Heart failure Mother   . Hypertension Mother   . CAD Father   . Hypertension Other   . CAD Other    Allergies  Allergen Reactions  . Penicillins Other (See Comments)    Has patient had a PCN reaction causing immediate rash, facial/tongue/throat swelling, SOB or lightheadedness with hypotension: Yes Has patient had a PCN reaction causing severe rash involving mucus membranes or skin necrosis: Yes  Has patient had a PCN reaction that required hospitalization:  No Has patient had a PCN reaction occurring within the last 10 years: No If all of the above answers are "NO", then may proceed with Cephalosporin use..   childhood reaction, swelling & had to be given an anti    OBJECTIVE: Vitals:   05/29/18 0926  BP: 103/70  Pulse: 79  Temp: 98.4 F (36.9 C)  Weight: 221 lb 1.9 oz (100.3 kg)   Body mass index is 26.92 kg/m.   Physical Exam  Constitutional: He is oriented to person, place, and time.  He is alert and in no distress.  He is in good spirits.  He is accompanied by his mother.  Pulmonary/Chest:    Musculoskeletal:  He has a healed incision over the anterior portion of his right shoulder.  In the midportion of the incision he has a open sinus with purulent drainage.  There is no surrounding erythema or fluctuance.  Neurological: He is alert and oriented to person, place, and time.  Skin: No rash noted.  Psychiatric: He has a normal mood and affect.    Microbiology: No results found for this or any previous visit (from the past 240 hour(s)).  Michel Bickers, MD [facility] [phone] pager   [phone] cell 05/29/2018, 9:59 AM

## 2018-05-30 LAB — BASIC METABOLIC PANEL
BUN: 22 mg/dL (ref 7–25)
CHLORIDE: 106 mmol/L (ref 98–110)
CO2: 24 mmol/L (ref 20–32)
CREATININE: 1.31 mg/dL (ref 0.70–1.33)
Calcium: 8.8 mg/dL (ref 8.6–10.3)
GLUCOSE: 90 mg/dL (ref 65–99)
Potassium: 3.8 mmol/L (ref 3.5–5.3)
Sodium: 139 mmol/L (ref 135–146)

## 2018-05-30 LAB — CBC
HCT: 30.8 % — ABNORMAL LOW (ref 38.5–50.0)
Hemoglobin: 8.9 g/dL — ABNORMAL LOW (ref 13.2–17.1)
MCH: 18.6 pg — AB (ref 27.0–33.0)
MCHC: 28.9 g/dL — ABNORMAL LOW (ref 32.0–36.0)
MCV: 64.3 fL — ABNORMAL LOW (ref 80.0–100.0)
MPV: 10 fL (ref 7.5–12.5)
PLATELETS: 364 10*3/uL (ref 140–400)
RBC: 4.79 10*6/uL (ref 4.20–5.80)
RDW: 18.5 % — ABNORMAL HIGH (ref 11.0–15.0)
WBC: 7.5 10*3/uL (ref 3.8–10.8)

## 2018-05-30 LAB — SEDIMENTATION RATE: Sed Rate: 6 mm/h (ref 0–20)

## 2018-05-30 LAB — C-REACTIVE PROTEIN: CRP: 9.8 mg/L — ABNORMAL HIGH (ref <8.0)

## 2018-05-31 ENCOUNTER — Other Ambulatory Visit: Payer: Self-pay | Admitting: Orthopedic Surgery

## 2018-05-31 ENCOUNTER — Other Ambulatory Visit (HOSPITAL_COMMUNITY): Payer: Self-pay | Admitting: Cardiology

## 2018-05-31 DIAGNOSIS — M86121 Other acute osteomyelitis, right humerus: Secondary | ICD-10-CM

## 2018-06-06 ENCOUNTER — Ambulatory Visit
Admission: RE | Admit: 2018-06-06 | Discharge: 2018-06-06 | Disposition: A | Discharge status: Home / Self Care | Payer: 59 | Source: Ambulatory Visit | Attending: Orthopedic Surgery | Admitting: Orthopedic Surgery

## 2018-06-06 DIAGNOSIS — M86121 Other acute osteomyelitis, right humerus: Secondary | ICD-10-CM

## 2018-06-06 MED ORDER — IOPAMIDOL (ISOVUE-300) INJECTION 61%
100.0000 mL | Freq: Once | INTRAVENOUS | Status: AC | PRN
  Administered 2018-06-06: 100 mL via INTRAVENOUS

## 2018-06-08 ENCOUNTER — Telehealth: Payer: Self-pay | Admitting: Internal Medicine

## 2018-06-08 NOTE — Telephone Encounter (Signed)
Mr. Justin Bernard had a recent right shoulder CT scan which showed:  IMPRESSION: 1. Posttraumatic deformity from healed surgical neck fracture. No bone destruction or periosteal reaction. No periosteal reaction or bone destruction to suggest osteomyelitis.   Electronically Signed   By: Elige Ko   On: 06/06/2018 16:02  There is no evidence of osteomyelitis or deep abscess to drain.  A recent culture of superficial drainage grew MSSA which is the probable culprit for his soft tissue infection.  He has a history of penicillin allergy but received IV cefazolin without difficulty when hospitalized in September 2017.  I recommend starting oral cephalexin.  If he does not improve he will need IV cefazolin.  I left a message on his phone for him to call me.

## 2018-06-11 ENCOUNTER — Other Ambulatory Visit: Payer: Self-pay | Admitting: Internal Medicine

## 2018-06-11 ENCOUNTER — Telehealth: Payer: Self-pay | Admitting: Internal Medicine

## 2018-06-11 DIAGNOSIS — M009 Pyogenic arthritis, unspecified: Secondary | ICD-10-CM

## 2018-06-11 MED ORDER — CEPHALEXIN 500 MG PO CAPS: 500.0000 mg | ORAL_CAPSULE | Freq: Three times a day (TID) | ORAL | 2 refills | Status: DC

## 2018-06-11 NOTE — Telephone Encounter (Signed)
I talked Justin Bernard by phone this morning and reviewed his recent CT scan findings.  recommended oral cephalexin.  He is in agreement with that plan.  I will arrange follow-up in my clinic in 2 weeks.

## 2018-06-11 NOTE — Telephone Encounter (Signed)
-----   Message from Cliffton Asters, MD sent at 05/29/2018  9:58 AM EDT ----- Needs R shoulder CT

## 2018-06-26 ENCOUNTER — Encounter: Payer: Self-pay | Admitting: Internal Medicine

## 2018-06-26 ENCOUNTER — Ambulatory Visit (INDEPENDENT_AMBULATORY_CARE_PROVIDER_SITE_OTHER): Payer: 59 | Admitting: Internal Medicine

## 2018-06-26 DIAGNOSIS — M009 Pyogenic arthritis, unspecified: Secondary | ICD-10-CM

## 2018-06-26 NOTE — Assessment & Plan Note (Signed)
He is improving slowly on therapy for MSSA soft tissue infection of his right shoulder.  I cannot explain why this would develop spontaneously 2 years after his last surgery.  He will continue cephalexin, get repeat lab work today and follow-up in 3 weeks.

## 2018-06-26 NOTE — Progress Notes (Signed)
[place] for Infectious Disease  Patient Active Problem List   Diagnosis Date Noted  . Infection of shoulder ([place]) 05/29/2018    Priority: High  . S/P hardware removal 04/29/2016    Priority: High  . Fracture closed, humerus 07/17/2015    Priority: High  . ICD (implantable cardioverter-defibrillator) in place 08/04/2015    Priority: Medium  . Nephrolithiasis 02/20/2017  . ETOH abuse 09/22/2016  . Orthostatic hypotension 09/10/2016  . Urinary retention 09/10/2016  . Chronic venous stasis dermatitis 09/08/2016  . Diabetes mellitus type II, non insulin dependent ([place]) 08/13/2016  . Painful orthopaedic hardware ([place]) 04/29/2016  . Chronic systolic CHF (congestive heart failure), NYHA class 2 ([place]) 07/17/2015    Patient's Medications  New Prescriptions   No medications on file  Previous Medications   ALBUTEROL (PROVENTIL HFA;VENTOLIN HFA) 108 (90 BASE) MCG/ACT INHALER    Inhale 2 puffs into the lungs every 6 (six) hours as needed for wheezing or shortness of breath.   CARVEDILOL (COREG) 6.25 MG TABLET    Take 1 tablet (6.25 mg total) by mouth 2 (two) times daily with a meal.   CEPHALEXIN (KEFLEX) 500 MG CAPSULE    Take 1 capsule (500 mg total) by mouth 3 (three) times daily.   FUROSEMIDE (LASIX) 40 MG TABLET    TAKE 1 TABLET BY MOUTH EVERY DAY   GLIPIZIDE (GLUCOTROL XL) 5 MG 24 HR TABLET    Take 5 mg by mouth daily with breakfast.    GLUCOSE MONITORING KIT (FREESTYLE) MONITORING KIT    1 each by Does not apply route 4 (four) times daily - after meals and at bedtime. 1 month Diabetic Testing Supplies for QAC-QHS accuchecks.   HYDROCORTISONE CREAM 1 %    Apply 1 application topically 2 (two) times daily as needed for itching.   IBUPROFEN (ADVIL,MOTRIN) 200 MG TABLET    Take 400-600 mg by mouth every 8 (eight) hours as needed (for pain/headaches.).   LISINOPRIL (PRINIVIL,ZESTRIL) 2.5 MG TABLET    Take 1 tablet (2.5 mg total) by mouth daily.   MULTIPLE VITAMIN  (MULTIVITAMIN WITH MINERALS) TABS TABLET    Take 1 tablet by mouth daily.   POTASSIUM CHLORIDE SA (K-DUR,KLOR-CON) 20 MEQ TABLET    Take 20 mEq by mouth 2 (two) times daily.    SPIRONOLACTONE (ALDACTONE) 25 MG TABLET    Take 25 mg by mouth daily.  Modified Medications   No medications on file  Discontinued Medications   No medications on file    Subjective: Harrington Challenger is in with his mother for his routine follow-up visit.  He is now completed 15 days of oral cephalexin for his MSSA soft tissue infection of his right shoulder.  His recent CT scan did not show any deep abscess, septic arthritis or osteomyelitis.  He states that the drainage from the small sinus tract on his shoulder is about "99%" decreased.  He still hears a squishy sound when he moves his shoulder.  He is not having any pain.  His mother states that he is looking much better and that his color has improved.  He is tolerating cephalexin well.  Review of Systems: Review of Systems  Constitutional: Negative for chills, diaphoresis, fever and weight loss.  Gastrointestinal: Negative for abdominal pain, diarrhea, nausea and vomiting.  Musculoskeletal: Negative for joint pain.  Skin: Negative for rash.    Past Medical History:  Diagnosis Date  . AICD (automatic cardioverter/defibrillator) present   .  Arthritis    L hip   . Asthma    pt. uses albuterol inhaler in the spring for enviromental allergies   . Cataract    LEFT EYE  . CHF (congestive heart failure) ([place])   . Chronic systolic heart failure (HCC)    NICM, previous heart transplant candidate with EF 10%  . Diabetes mellitus without complication ([place])    TYPE 2  . Family history of adverse reaction to anesthesia    sister- headache & N&V am and mother  . History of hiatal hernia   . History of kidney stones   . Hypertension   . Neuromuscular disorder ([place])    essential tremor  . Shortness of breath dyspnea    WITH EXERTION    Social History   Tobacco Use  .  Smoking status: Never Smoker  . Smokeless tobacco: Never Used  Substance Use Topics  . Alcohol use: No  . Drug use: No    Family History  Problem Relation Age of Onset  . Heart failure Mother   . Hypertension Mother   . CAD Father   . Hypertension Other   . CAD Other     Allergies  Allergen Reactions  . Penicillins Other (See Comments)    Has patient had a PCN reaction causing immediate rash, facial/tongue/throat swelling, SOB or lightheadedness with hypotension: Yes Has patient had a PCN reaction causing severe rash involving mucus membranes or skin necrosis: Yes Has patient had a PCN reaction that required hospitalization:  No Has patient had a PCN reaction occurring within the last 10 years: No If all of the above answers are "NO", then may proceed with Cephalosporin use..   childhood reaction, swelling & had to be given an anti    Objective: Vitals:   06/26/18 1003  BP: 99/66  Pulse: 82  Temp: 97.6 F (36.4 C)  TempSrc: Oral  Weight: 249 lb (112.9 kg)   Body mass index is 30.31 kg/m.  Physical Exam  Constitutional: He is oriented to person, place, and time.  He is in good spirits.  Cardiovascular: Normal rate, regular rhythm and normal heart sounds.  Pulmonary/Chest: Effort normal and breath sounds normal. He has no wheezes. He has no rales.  Musculoskeletal:  He still has a small, pinhole sinus tract in the midportion of his previously healed surgical incision on his anterior right shoulder.  He has a small amount of yellow drainage on his Band-Aid dressing.  I cannot express any fluid.  There is no surrounding fluctuance, swelling, warmth or cellulitis.  I do hear the "squishing" sound when he puts his shoulder through range of motion.  Neurological: He is alert and oriented to person, place, and time.  Skin: No rash noted.    Lab Results Sed Rate (mm/h)  Date Value  05/29/2018 6   CRP (mg/L)  Date Value  05/29/2018 9.8 (H)  This   Problem List  Items Addressed This Visit      High   Infection of shoulder ([place])    He is improving slowly on therapy for MSSA soft tissue infection of his right shoulder.  I cannot explain why this would develop spontaneously 2 years after his last surgery.  He will continue cephalexin, get repeat lab work today and follow-up in 3 weeks.      Relevant Orders   C-reactive protein   Sedimentation rate       Michel Bickers, MD [facility] for Infectious Disease [place] [phone]  pager   [phone] cell 06/26/2018, 11:01 AM

## 2018-06-27 LAB — SEDIMENTATION RATE: Sed Rate: 6 mm/h (ref 0–20)

## 2018-06-27 LAB — C-REACTIVE PROTEIN: CRP: 6.1 mg/L (ref <8.0)

## 2018-07-01 IMAGING — CR DG SHOULDER 2+V PORT*R*
1 series · 1 of 1 positions shown · non-contrast
Comparison: Intraoperative film same day

CLINICAL DATA: Postop heterotopic ossification right shoulder

EXAM:
PORTABLE RIGHT SHOULDER

[AP]
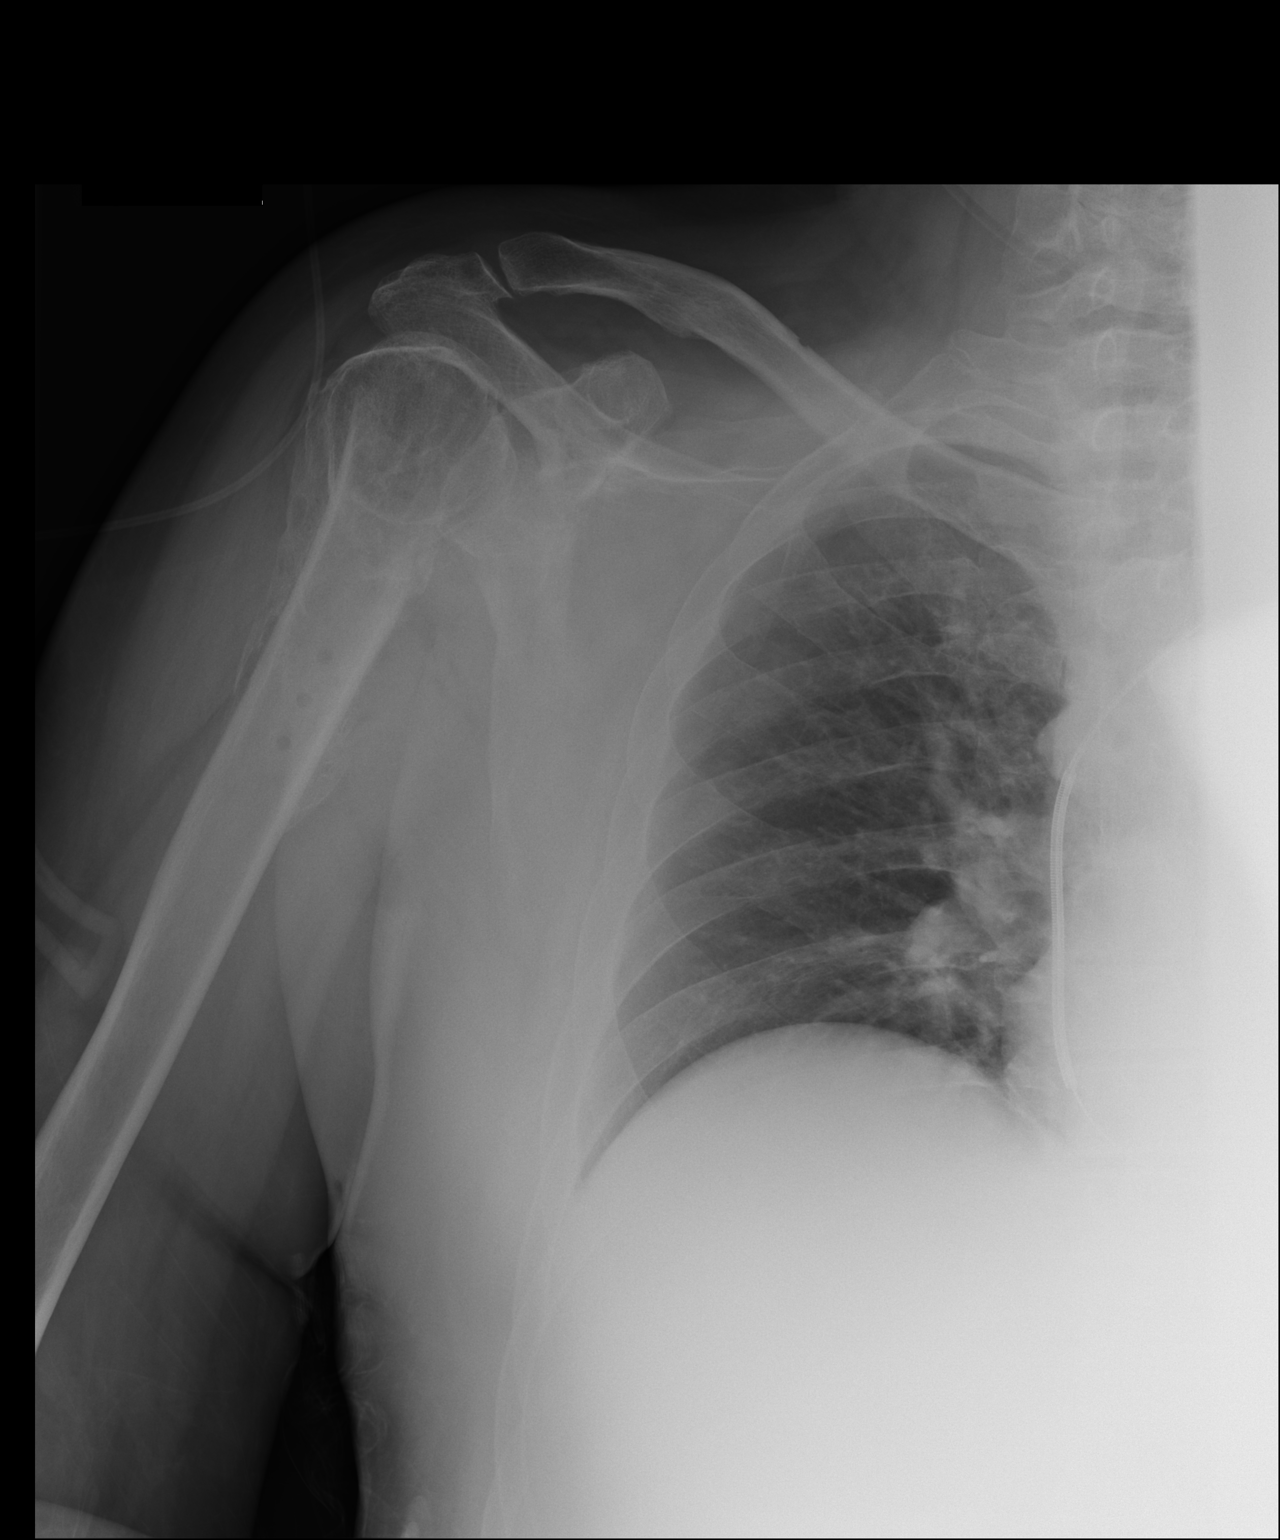

[1 of 1 positions shown; findings below may reference images not displayed]

FINDINGS: Single portable view of the right shoulder submitted. The patient is
status post hardware removal right shoulder. Hypertrophic
calcification adjacent to proximal humerus again noted. Mild
degenerative changes glenohumeral joint. There is anatomic
alignment.
IMPRESSION: The patient is status post hardware removal right shoulder.
Hypertrophic calcification adjacent to proximal humerus again noted.
Mild degenerative changes glenohumeral joint. There is anatomic
alignment.

## 2018-07-17 ENCOUNTER — Ambulatory Visit (INDEPENDENT_AMBULATORY_CARE_PROVIDER_SITE_OTHER): Payer: 59

## 2018-07-17 DIAGNOSIS — I5022 Chronic systolic (congestive) heart failure: Secondary | ICD-10-CM

## 2018-07-17 NOTE — Progress Notes (Signed)
Remote ICD transmission.   

## 2018-07-18 ENCOUNTER — Ambulatory Visit: Payer: 59 | Admitting: Internal Medicine

## 2018-07-18 ENCOUNTER — Encounter: Payer: Self-pay | Admitting: Internal Medicine

## 2018-07-18 DIAGNOSIS — M009 Pyogenic arthritis, unspecified: Secondary | ICD-10-CM | POA: Diagnosis not present

## 2018-07-18 NOTE — Progress Notes (Signed)
[facility] for Infectious Disease  Patient Active Problem List   Diagnosis Date Noted  . Infection of shoulder ([place]) 05/29/2018    Priority: High  . S/P hardware removal 04/29/2016    Priority: High  . Fracture closed, humerus 07/17/2015    Priority: High  . ICD (implantable cardioverter-defibrillator) in place 08/04/2015    Priority: Medium  . Nephrolithiasis 02/20/2017  . ETOH abuse 09/22/2016  . Orthostatic hypotension 09/10/2016  . Urinary retention 09/10/2016  . Chronic venous stasis dermatitis 09/08/2016  . Diabetes mellitus type II, non insulin dependent ([place]) 08/13/2016  . Painful orthopaedic hardware ([place]) 04/29/2016  . Chronic systolic CHF (congestive heart failure), NYHA class 2 ([place]) 07/17/2015    Patient's Medications  New Prescriptions   No medications on file  Previous Medications   ALBUTEROL (PROVENTIL HFA;VENTOLIN HFA) 108 (90 BASE) MCG/ACT INHALER    Inhale 2 puffs into the lungs every 6 (six) hours as needed for wheezing or shortness of breath.   CARVEDILOL (COREG) 6.25 MG TABLET    Take 1 tablet (6.25 mg total) by mouth 2 (two) times daily with a meal.   CEPHALEXIN (KEFLEX) 500 MG CAPSULE    Take 1 capsule (500 mg total) by mouth 3 (three) times daily.   FUROSEMIDE (LASIX) 40 MG TABLET    TAKE 1 TABLET BY MOUTH EVERY DAY   GLIPIZIDE (GLUCOTROL XL) 5 MG 24 HR TABLET    Take 5 mg by mouth daily with breakfast.    GLUCOSE MONITORING KIT (FREESTYLE) MONITORING KIT    1 each by Does not apply route 4 (four) times daily - after meals and at bedtime. 1 month Diabetic Testing Supplies for QAC-QHS accuchecks.   HYDROCORTISONE CREAM 1 %    Apply 1 application topically 2 (two) times daily as needed for itching.   IBUPROFEN (ADVIL,MOTRIN) 200 MG TABLET    Take 400-600 mg by mouth every 8 (eight) hours as needed (for pain/headaches.).   LISINOPRIL (PRINIVIL,ZESTRIL) 2.5 MG TABLET    Take 1 tablet (2.5 mg total) by mouth daily.   MULTIPLE VITAMIN  (MULTIVITAMIN WITH MINERALS) TABS TABLET    Take 1 tablet by mouth daily.   POTASSIUM CHLORIDE SA (K-DUR,KLOR-CON) 20 MEQ TABLET    Take 20 mEq by mouth 2 (two) times daily.    SPIRONOLACTONE (ALDACTONE) 25 MG TABLET    Take 25 mg by mouth daily.  Modified Medications   No medications on file  Discontinued Medications   No medications on file    Subjective: Justin Bernard is in with his mother for his routine follow-up visit.  He hass now completed 37 days of oral cephalexin for his MSSA soft tissue infection of his right shoulder.  His recent CT scan did not show any deep abscess, septic arthritis or osteomyelitis.  He states that the drainage from the small sinus tract on his shoulder has decreased.  He has a small drop of yellow drainage on his Band-Aid about twice daily.  He still hears a squishy sound when he moves his shoulder.  Also, he recently had a bout of strong sneezing and heard a whistling noise coming out of the sinus tract.  He is tolerating cephalexin well.  Review of Systems: Review of Systems  Constitutional: Negative for chills, diaphoresis, fever and weight loss.  Gastrointestinal: Negative for abdominal pain, diarrhea, nausea and vomiting.  Musculoskeletal: Negative for joint pain.  Skin: Negative for rash.    Past Medical History:  Diagnosis Date  . AICD (automatic cardioverter/defibrillator) present   . Arthritis    L hip   . Asthma    pt. uses albuterol inhaler in the spring for enviromental allergies   . Cataract    LEFT EYE  . CHF (congestive heart failure) ([place])   . Chronic systolic heart failure (HCC)    NICM, previous heart transplant candidate with EF 10%  . Diabetes mellitus without complication ([place])    TYPE 2  . Family history of adverse reaction to anesthesia    sister- headache & N&V am and mother  . History of hiatal hernia   . History of kidney stones   . Hypertension   . Neuromuscular disorder ([place])    essential tremor  . Shortness of breath  dyspnea    WITH EXERTION    Social History   Tobacco Use  . Smoking status: Never Smoker  . Smokeless tobacco: Never Used  Substance Use Topics  . Alcohol use: No  . Drug use: No    Family History  Problem Relation Age of Onset  . Heart failure Mother   . Hypertension Mother   . CAD Father   . Hypertension Other   . CAD Other     Allergies  Allergen Reactions  . Penicillins Other (See Comments)    Has patient had a PCN reaction causing immediate rash, facial/tongue/throat swelling, SOB or lightheadedness with hypotension: Yes Has patient had a PCN reaction causing severe rash involving mucus membranes or skin necrosis: Yes Has patient had a PCN reaction that required hospitalization:  No Has patient had a PCN reaction occurring within the last 10 years: No If all of the above answers are "NO", then may proceed with Cephalosporin use..   childhood reaction, swelling & had to be given an anti    Objective: Vitals:   07/18/18 1448  BP: 114/66  Pulse: 82  Temp: (!) 97.5 F (36.4 C)  TempSrc: Oral  Weight: 271 lb (122.9 kg)  Height: 6' 4.75" (1.949 m)   Body mass index is 32.35 kg/m.  Physical Exam  Constitutional: He is oriented to person, place, and time.  He is in good spirits.  Cardiovascular: Normal rate, regular rhythm and normal heart sounds.  Pulmonary/Chest: Effort normal and breath sounds normal. He has no wheezes. He has no rales.  Musculoskeletal:  There has been no change in the small, pinhole sinus tract in the midportion of his previously healed surgical incision on his anterior right shoulder.  He has a small amount of yellow drainage on his Band-Aid dressing.  I cannot express any fluid.  There is no surrounding fluctuance, swelling, or warmth.  There is some erythema around the opening which he states itches.  It looks compatible with an allergic reaction to the Band-Aid adhesive.  I still hear the "squishing" sound when he puts his shoulder through  range of motion.  Neurological: He is alert and oriented to person, place, and time.  Skin: No rash noted.    Lab Results Sed Rate (mm/h)  Date Value  06/26/2018 6  05/29/2018 6   CRP (mg/L)  Date Value  06/26/2018 6.1  05/29/2018 9.8 (H)  This   Problem List Items Addressed This Visit      High   Infection of shoulder ([place])    Overall, he has improved with empiric cephalexin but I cannot explain why he had this late recurrence of shoulder infection and why he has not had more improvement.  He has a very unusual squishing sound and I cannot explain the whistling noise he describes.  I would like him to be seen by Dr. Marcelino Scot, his orthopedic surgeon.  He will continue cephalexin for now and follow-up here in 3 weeks.          Michel Bickers, MD [facility] [place] [facility] [phone] pager   [phone] cell 07/18/2018, 3:09 PM

## 2018-07-18 NOTE — Assessment & Plan Note (Signed)
Overall, he has improved with empiric cephalexin but I cannot explain why he had this late recurrence of shoulder infection and why he has not had more improvement.  He has a very unusual squishing sound and I cannot explain the whistling noise he describes.  I would like him to be seen by Dr. Carola Frost, his orthopedic surgeon.  He will continue cephalexin for now and follow-up here in 3 weeks.

## 2018-08-06 ENCOUNTER — Ambulatory Visit (INDEPENDENT_AMBULATORY_CARE_PROVIDER_SITE_OTHER): Payer: 59 | Admitting: Internal Medicine

## 2018-08-06 ENCOUNTER — Encounter: Payer: Self-pay | Admitting: Internal Medicine

## 2018-08-06 DIAGNOSIS — M009 Pyogenic arthritis, unspecified: Secondary | ICD-10-CM | POA: Diagnosis not present

## 2018-08-06 NOTE — Progress Notes (Signed)
[place] for Infectious Disease  Patient Active Problem List   Diagnosis Date Noted  . Infection of shoulder ([place]) 05/29/2018    Priority: High  . S/P hardware removal 04/29/2016    Priority: High  . Fracture closed, humerus 07/17/2015    Priority: High  . ICD (implantable cardioverter-defibrillator) in place 08/04/2015    Priority: Medium  . Nephrolithiasis 02/20/2017  . ETOH abuse 09/22/2016  . Orthostatic hypotension 09/10/2016  . Urinary retention 09/10/2016  . Chronic venous stasis dermatitis 09/08/2016  . Diabetes mellitus type II, non insulin dependent ([place]) 08/13/2016  . Painful orthopaedic hardware ([place]) 04/29/2016  . Chronic systolic CHF (congestive heart failure), NYHA class 2 ([place]) 07/17/2015    Patient's Medications  New Prescriptions   No medications on file  Previous Medications   ALBUTEROL (PROVENTIL HFA;VENTOLIN HFA) 108 (90 BASE) MCG/ACT INHALER    Inhale 2 puffs into the lungs every 6 (six) hours as needed for wheezing or shortness of breath.   CARVEDILOL (COREG) 6.25 MG TABLET    Take 1 tablet (6.25 mg total) by mouth 2 (two) times daily with a meal.   CEPHALEXIN (KEFLEX) 500 MG CAPSULE    Take 1 capsule (500 mg total) by mouth 3 (three) times daily.   FUROSEMIDE (LASIX) 40 MG TABLET    TAKE 1 TABLET BY MOUTH EVERY DAY   GLIPIZIDE (GLUCOTROL XL) 5 MG 24 HR TABLET    Take 5 mg by mouth daily with breakfast.    GLUCOSE MONITORING KIT (FREESTYLE) MONITORING KIT    1 each by Does not apply route 4 (four) times daily - after meals and at bedtime. 1 month Diabetic Testing Supplies for QAC-QHS accuchecks.   HYDROCORTISONE CREAM 1 %    Apply 1 application topically 2 (two) times daily as needed for itching.   IBUPROFEN (ADVIL,MOTRIN) 200 MG TABLET    Take 400-600 mg by mouth every 8 (eight) hours as needed (for pain/headaches.).   LISINOPRIL (PRINIVIL,ZESTRIL) 2.5 MG TABLET    Take 1 tablet (2.5 mg total) by mouth daily.   MULTIPLE VITAMIN  (MULTIVITAMIN WITH MINERALS) TABS TABLET    Take 1 tablet by mouth daily.   POTASSIUM CHLORIDE SA (K-DUR,KLOR-CON) 20 MEQ TABLET    Take 20 mEq by mouth 2 (two) times daily.    SPIRONOLACTONE (ALDACTONE) 25 MG TABLET    Take 25 mg by mouth daily.  Modified Medications   No medications on file  Discontinued Medications   No medications on file    Subjective: Justin Bernard is in with his mother for his routine follow-up visit.  He hass now completed 56 days of oral cephalexin for his MSSA soft tissue infection of his right shoulder.  His recent CT scan did not show any deep abscess, septic arthritis or osteomyelitis.  He states that the drainage from the small sinus tract on his shoulder has decreased.    Review of Systems: Review of Systems  Constitutional: Negative for chills, diaphoresis, fever and weight loss.  Gastrointestinal: Negative for abdominal pain, diarrhea, nausea and vomiting.  Musculoskeletal: Negative for joint pain.  Skin: Negative for rash.    Past Medical History:  Diagnosis Date  . AICD (automatic cardioverter/defibrillator) present   . Arthritis    L hip   . Asthma    pt. uses albuterol inhaler in the spring for enviromental allergies   . Cataract    LEFT EYE  . CHF (congestive heart failure) ([place])   .  Chronic systolic heart failure (HCC)    NICM, previous heart transplant candidate with EF 10%  . Diabetes mellitus without complication ([place])    TYPE 2  . Family history of adverse reaction to anesthesia    sister- headache & N&V am and mother  . History of hiatal hernia   . History of kidney stones   . Hypertension   . Neuromuscular disorder ([place])    essential tremor  . Shortness of breath dyspnea    WITH EXERTION    Social History   Tobacco Use  . Smoking status: Never Smoker  . Smokeless tobacco: Never Used  Substance Use Topics  . Alcohol use: No  . Drug use: No    Family History  Problem Relation Age of Onset  . Heart failure Mother   .  Hypertension Mother   . CAD Father   . Hypertension Other   . CAD Other     Allergies  Allergen Reactions  . Penicillins Other (See Comments)    Has patient had a PCN reaction causing immediate rash, facial/tongue/throat swelling, SOB or lightheadedness with hypotension: Yes Has patient had a PCN reaction causing severe rash involving mucus membranes or skin necrosis: Yes Has patient had a PCN reaction that required hospitalization:  No Has patient had a PCN reaction occurring within the last 10 years: No If all of the above answers are "NO", then may proceed with Cephalosporin use..   childhood reaction, swelling & had to be given an anti    Objective: Vitals:   08/06/18 1341  BP: (!) 80/50  Pulse: 75  Temp: 98 F (36.7 C)  Weight: 273 lb (123.8 kg)   Body mass index is 32.58 kg/m.  Physical Exam Constitutional:      Comments: He is in good spirits.  Cardiovascular:     Rate and Rhythm: Normal rate and regular rhythm.     Heart sounds: Normal heart sounds.  Pulmonary:     Effort: Pulmonary effort is normal.     Breath sounds: Normal breath sounds. No wheezing or rales.  Musculoskeletal:     Comments: There has been no change in the small, pinhole sinus tract in the midportion of his previously healed surgical incision on his anterior right shoulder.  He has a small amount of yellow drainage on his Band-Aid dressing.  I cannot express any fluid.  There is no surrounding fluctuance, swelling, or warmth.  There is some erythema around the opening which he states itches.   Skin:    Findings: No rash.  Neurological:     Mental Status: He is alert and oriented to person, place, and time.     Lab Results Sed Rate (mm/h)  Date Value  06/26/2018 6  05/29/2018 6   CRP (mg/L)  Date Value  06/26/2018 6.1  05/29/2018 9.8 (H)  This   Problem List Items Addressed This Visit      High   Infection of shoulder ([place])    I am still quite concerned about deep infection  even though his recent CT scan failed to show any evidence of osteomyelitis or abscess.  I will have him complete his current bottle of cephalexin and then stop.  He will follow-up here in 6 weeks.  If he continues to have drainage I will discuss the case with Dr. Marcelino Scot and consider a repeat CT scan.          Michel Bickers, MD [place] for Infectious Disease [facility]  [phone] pager   [phone] cell 08/06/2018, 2:00 PM

## 2018-08-06 NOTE — Assessment & Plan Note (Signed)
I am still quite concerned about deep infection even though his recent CT scan failed to show any evidence of osteomyelitis or abscess.  I will have him complete his current bottle of cephalexin and then stop.  He will follow-up here in 6 weeks.  If he continues to have drainage I will discuss the case with Dr. Carola Frost and consider a repeat CT scan.

## 2018-08-14 ENCOUNTER — Telehealth: Payer: Self-pay | Admitting: *Deleted

## 2018-08-14 NOTE — Telephone Encounter (Signed)
Patient called and states he seen Dr. Orvan Falconer for an infection in his Shoulder. Patient want to let Dr. Orvan Falconer know that the drainage has stopped and the incision has started to heal. No discharge since last Thursday. Patient is continuing to take huis medication . Thank you

## 2018-08-16 NOTE — Telephone Encounter (Signed)
That is good news.  Thanks for letting me know.

## 2018-08-20 ENCOUNTER — Telehealth: Payer: Self-pay | Admitting: *Deleted

## 2018-08-20 NOTE — Telephone Encounter (Addendum)
Patient called with update. He has noted "hearing/feeling" a swishy/swash feeling with movement in his shoulder. No increased pain, no drainage. The incision is "ok, but has a darker color at the center." He feels this is a big step backwards. He still has 5-6 cephalexin left in his current bottle. He will call Dr Carola Frost as well. He had been taking down Christmas decorations over the weekend. Please advise. Andree Coss, RN

## 2018-08-20 NOTE — Telephone Encounter (Signed)
Left message with Dr Blair Dolphin instructions. Thanks!

## 2018-08-20 NOTE — Telephone Encounter (Signed)
Have him finish his supply of cephalexin and then stop antibiotics.  I would like him to see Dr. Carola Frost.  If he starts having drainage again I think it would be important to consider exploratory surgery.  He has a follow-up appointment with me on 08/30/2018.

## 2018-08-23 ENCOUNTER — Other Ambulatory Visit (HOSPITAL_COMMUNITY): Payer: Self-pay | Admitting: Cardiology

## 2018-08-30 ENCOUNTER — Ambulatory Visit: Payer: 59 | Admitting: Internal Medicine

## 2018-09-06 LAB — CUP PACEART REMOTE DEVICE CHECK
Date Time Interrogation Session: 20200123165003
Implantable Lead Implant Date: 20120222
Implantable Lead Location: 753860
Implantable Lead Model: 296
Implantable Pulse Generator Implant Date: 20120222
MDC IDC LEAD SERIAL: 100951
Pulse Gen Serial Number: 100238

## 2018-09-10 ENCOUNTER — Ambulatory Visit (INDEPENDENT_AMBULATORY_CARE_PROVIDER_SITE_OTHER): Payer: 59 | Admitting: Internal Medicine

## 2018-09-10 ENCOUNTER — Telehealth: Payer: Self-pay | Admitting: Internal Medicine

## 2018-09-10 DIAGNOSIS — M009 Pyogenic arthritis, unspecified: Secondary | ICD-10-CM

## 2018-09-10 NOTE — Assessment & Plan Note (Signed)
I will have the Sherrill Raring stop cephalexin now in anticipation of upcoming surgery.  He has had a partial response to cephalexin.  I suspect that his infection is due to MSSA which was isolated from superficial sinus drainage but I would prefer to have operative cultures off of antibiotics.  I asked him to notify me to let me know when his surgery is planned so we can see him in the hospital.

## 2018-09-10 NOTE — Telephone Encounter (Signed)
       I received this communication today from [facility]' orthopedic surgeon, Dr. Myrene Galas.  [place], I'm sorry I forgot to reach out to you a little bit earlier. I saw Justin Bernard about his Lanetta Inch and have recommended debridement when cleared by pulmonary and cardiology for his COPD and CHF. My plan would be to leave some antibiotic beads in there that are resorbable. The CT scan suggest a entry/ exit point and possible Brodie's for theInfection.  That sounds like a very good plan. I would keep him off antibiotics and get new operative cultures to help guide post op treatment. Thanks.  [name]

## 2018-09-10 NOTE — Progress Notes (Signed)
[place] for Infectious Disease  Patient Active Problem List   Diagnosis Date Noted  . Infection of shoulder ([place]) 05/29/2018    Priority: High  . S/P hardware removal 04/29/2016    Priority: High  . Fracture closed, humerus 07/17/2015    Priority: High  . ICD (implantable cardioverter-defibrillator) in place 08/04/2015    Priority: Medium  . Nephrolithiasis 02/20/2017  . ETOH abuse 09/22/2016  . Orthostatic hypotension 09/10/2016  . Urinary retention 09/10/2016  . Chronic venous stasis dermatitis 09/08/2016  . Diabetes mellitus type II, non insulin dependent ([place]) 08/13/2016  . Painful orthopaedic hardware ([place]) 04/29/2016  . Chronic systolic CHF (congestive heart failure), NYHA class 2 ([place]) 07/17/2015    Patient's Medications  New Prescriptions   No medications on file  Previous Medications   ALBUTEROL (PROVENTIL HFA;VENTOLIN HFA) 108 (90 BASE) MCG/ACT INHALER    Inhale 2 puffs into the lungs every 6 (six) hours as needed for wheezing or shortness of breath.   CARVEDILOL (COREG) 6.25 MG TABLET    Take 1 tablet (6.25 mg total) by mouth 2 (two) times daily with a meal.   FUROSEMIDE (LASIX) 40 MG TABLET    TAKE 1 TABLET BY MOUTH EVERY DAY   GLIPIZIDE (GLUCOTROL XL) 5 MG 24 HR TABLET    Take 5 mg by mouth daily with breakfast.    GLUCOSE MONITORING KIT (FREESTYLE) MONITORING KIT    1 each by Does not apply route 4 (four) times daily - after meals and at bedtime. 1 month Diabetic Testing Supplies for QAC-QHS accuchecks.   HYDROCORTISONE CREAM 1 %    Apply 1 application topically 2 (two) times daily as needed for itching.   IBUPROFEN (ADVIL,MOTRIN) 200 MG TABLET    Take 400-600 mg by mouth every 8 (eight) hours as needed (for pain/headaches.).   LISINOPRIL (PRINIVIL,ZESTRIL) 2.5 MG TABLET    Take 1 tablet (2.5 mg total) by mouth daily.   MULTIPLE VITAMIN (MULTIVITAMIN WITH MINERALS) TABS TABLET    Take 1 tablet by mouth daily.   POTASSIUM CHLORIDE SA  (K-DUR,KLOR-CON) 20 MEQ TABLET    Take 20 mEq by mouth 2 (two) times daily.    SPIRONOLACTONE (ALDACTONE) 25 MG TABLET    Take 25 mg by mouth daily.  Modified Medications   No medications on file  Discontinued Medications   CEPHALEXIN (KEFLEX) 500 MG CAPSULE    Take 1 capsule (500 mg total) by mouth 3 (three) times daily.    Subjective: Justin Bernard is in with his mother again today for his routine follow-up visit.  He has been on cephalexin for a little over 2 months for his chronic draining sinus over his anterior right shoulder.  He met with Dr. Altamese Tehuacana, his orthopedic surgeon, this morning.  He tells me that Dr. Marcelino Scot over read his recent CT scan and said that he thinks that there is an area of chronic osteomyelitis.  He is planning on operative debridement once he is cleared for surgery by his cardiologist.  Review of Systems: Review of Systems  Constitutional: Negative for chills, diaphoresis and fever.  Gastrointestinal: Negative for abdominal pain, diarrhea, nausea and vomiting.  Musculoskeletal: Positive for joint pain.    Past Medical History:  Diagnosis Date  . AICD (automatic cardioverter/defibrillator) present   . Arthritis    L hip   . Asthma    pt. uses albuterol inhaler in the spring for enviromental allergies   . Cataract  LEFT EYE  . CHF (congestive heart failure) ([place])   . Chronic systolic heart failure (HCC)    NICM, previous heart transplant candidate with EF 10%  . Diabetes mellitus without complication ([place])    TYPE 2  . Family history of adverse reaction to anesthesia    sister- headache & N&V am and mother  . History of hiatal hernia   . History of kidney stones   . Hypertension   . Neuromuscular disorder ([place])    essential tremor  . Shortness of breath dyspnea    WITH EXERTION    Social History   Tobacco Use  . Smoking status: Never Smoker  . Smokeless tobacco: Never Used  Substance Use Topics  . Alcohol use: No  . Drug use: No    Family  History  Problem Relation Age of Onset  . Heart failure Mother   . Hypertension Mother   . CAD Father   . Hypertension Other   . CAD Other     Allergies  Allergen Reactions  . Penicillins Other (See Comments)    Has patient had a PCN reaction causing immediate rash, facial/tongue/throat swelling, SOB or lightheadedness with hypotension: Yes Has patient had a PCN reaction causing severe rash involving mucus membranes or skin necrosis: Yes Has patient had a PCN reaction that required hospitalization:  No Has patient had a PCN reaction occurring within the last 10 years: No If all of the above answers are "NO", then may proceed with Cephalosporin use..   childhood reaction, swelling & had to be given an anti    Objective: There were no vitals filed for this visit. There is no height or weight on file to calculate BMI.  Physical Exam Constitutional:      Comments: He is pleasant and in good spirits as usual.  Musculoskeletal:     Comments: The sinus tract over his anterior right shoulder is scabbed over currently but he says that it drains intermittently.  There is some surrounding erythema that looks like a tape reaction.  Psychiatric:        Mood and Affect: Mood normal.     Lab Results    Problem List Items Addressed This Visit      High   Infection of shoulder ([place])    I will have the Russ stop cephalexin now in anticipation of upcoming surgery.  He has had a partial response to cephalexin.  I suspect that his infection is due to MSSA which was isolated from superficial sinus drainage but I would prefer to have operative cultures off of antibiotics.  I asked him to notify me to let me know when his surgery is planned so we can see him in the hospital.          Michel Bickers, MD [facility] for [place] [phone] pager   [phone] cell 09/10/2018, 4:49 PM

## 2018-09-14 ENCOUNTER — Other Ambulatory Visit (HOSPITAL_COMMUNITY): Payer: Self-pay | Admitting: Cardiology

## 2018-09-24 ENCOUNTER — Ambulatory Visit (HOSPITAL_COMMUNITY)
Admission: RE | Admit: 2018-09-24 | Discharge: 2018-09-24 | Disposition: A | Discharge status: Home / Self Care | Payer: 59 | Source: Ambulatory Visit | Attending: Cardiology | Admitting: Cardiology

## 2018-09-24 ENCOUNTER — Encounter (HOSPITAL_COMMUNITY): Payer: Self-pay | Admitting: Cardiology

## 2018-09-24 VITALS — BP 112/70 | HR 90 | Wt 277.6 lb

## 2018-09-24 DIAGNOSIS — Z8249 Family history of ischemic heart disease and other diseases of the circulatory system: Secondary | ICD-10-CM | POA: Diagnosis not present

## 2018-09-24 DIAGNOSIS — I428 Other cardiomyopathies: Secondary | ICD-10-CM | POA: Diagnosis not present

## 2018-09-24 DIAGNOSIS — M86111 Other acute osteomyelitis, right shoulder: Secondary | ICD-10-CM | POA: Insufficient documentation

## 2018-09-24 DIAGNOSIS — Z7984 Long term (current) use of oral hypoglycemic drugs: Secondary | ICD-10-CM | POA: Diagnosis not present

## 2018-09-24 DIAGNOSIS — B9561 Methicillin susceptible Staphylococcus aureus infection as the cause of diseases classified elsewhere: Secondary | ICD-10-CM | POA: Diagnosis not present

## 2018-09-24 DIAGNOSIS — I5022 Chronic systolic (congestive) heart failure: Secondary | ICD-10-CM | POA: Diagnosis present

## 2018-09-24 DIAGNOSIS — R9431 Abnormal electrocardiogram [ECG] [EKG]: Secondary | ICD-10-CM | POA: Diagnosis not present

## 2018-09-24 DIAGNOSIS — Z79899 Other long term (current) drug therapy: Secondary | ICD-10-CM | POA: Insufficient documentation

## 2018-09-24 DIAGNOSIS — E119 Type 2 diabetes mellitus without complications: Secondary | ICD-10-CM | POA: Diagnosis not present

## 2018-09-24 LAB — BASIC METABOLIC PANEL
Anion gap: 10 (ref 5–15)
BUN: 20 mg/dL (ref 6–20)
CALCIUM: 8.9 mg/dL (ref 8.9–10.3)
CHLORIDE: 105 mmol/L (ref 98–111)
CO2: 24 mmol/L (ref 22–32)
CREATININE: 0.99 mg/dL (ref 0.61–1.24)
GFR calc Af Amer: >60 mL/min (ref >60)
GFR calc non Af Amer: >60 mL/min (ref >60)
Glucose, Bld: 125 mg/dL — ABNORMAL HIGH (ref 70–99)
Potassium: 3.9 mmol/L (ref 3.5–5.1)
Sodium: 139 mmol/L (ref 135–145)

## 2018-09-24 LAB — BRAIN NATRIURETIC PEPTIDE: B Natriuretic Peptide: 62.4 pg/mL (ref 0.0–100.0)

## 2018-09-24 MED ORDER — FUROSEMIDE 40 MG PO TABS: 40.0000 mg | ORAL_TABLET | Freq: Two times a day (BID) | ORAL | 2 refills | Status: DC

## 2018-09-24 MED ORDER — POTASSIUM CHLORIDE CRYS ER 20 MEQ PO TBCR: EXTENDED_RELEASE_TABLET | ORAL | 3 refills | Status: DC

## 2018-09-24 NOTE — Patient Instructions (Signed)
Lab work done today. We will contact you for any abnormal lab work.  Lab work will need to be done again in 10-14 days  EKG done today  INCREASE Furosemide 40mg  (1 tab) twice daily  INCREASE Potassium (2 tabs) every morning and (1 tab) every evening.  Your physician has requested that you have an echocardiogram. Echocardiography is a painless test that uses sound waves to create images of your heart. It provides your doctor with information about the size and shape of your heart and how well your heart's chambers and valves are working. This procedure takes approximately one hour. There are no restrictions for this procedure.  Follow up with the Advanced Practice Providers in 2 weeks

## 2018-09-25 NOTE — Progress Notes (Signed)
Advanced Heart Failure Clinic Note   Patient ID: Montez Cuda, male   DOB: 1961/06/10, 58 y.o.   MRN: [id number]  PCP: Marda Stalker, PA-C with [facility] Physician Cardiology: Dr. Aundra Dubin  58 y.o.with history of nonischemic cardiomyopathy returns for for followup of CHF.    CHF was first noted in 07/2010 per patient when he developed dyspnea. He was seen at [facility]. Of note, mother had CHF diagnosis around 31 and grandfather had CHF diagnosis around 61. Pt's disease thought to be NICM with clean cath as below. 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  Initially, LVAD and transplant were considered, but EF improved greatly on medical management as echoes below demonstrate. 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   Most recent echo in 1/18 with EF 40-45%, diffuse hypokinesis.   Recently, he has developed MSSA osteomyelitis of right shoulder and needs debridement.   Since I last saw him in 2018, his weight is up 50 lbs.  He does ok walking around the house but is short of breath with moderate activities like yardwork.  This has been worse for 3-4 weeks.  No orthopnea/PND.  No chest pain.  No palpitations.  In the past, has had lightheadedness if he does not take lisinopril in the evening.  He had not had any lightheadedness for a year, but then had 2 episodes last week where he felt lightheaded with standing (no palpitations).    Labs (12/16): K 3.5, creatinine 0.82, HCT 0.82 Labs (3/17): K 3.7, creatinine 0.67 Labs (02/2017): K 3.8, creatinine 0.91 Labs (8/18): K 3.8, creatinine 1.05, hgb 11.4 Labs (10/19): K 3.8, creatinine 1.31  ECG (personally reviewed): NSR with PVCs  SH: He lives with his 2 nephews and son in [place].  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   FH: Mother with CHF around 71, grandfather with CHF around 91.   ROS: All systems reviewed and negative  except as per HPI.   PMH:  1. Chronic systolic CHF:  Nonischemic cardiomyopathy, initially followed at [facility]. [place] 08/13/10 [facility], [place]: areas or reversibility in anterior wall and lesser extent inferior wall. Dilated LV with EF 15% and severe hypokinesis. - LHC 08/31/10 [facility], [place]: Right dominant. Left main free of disease, branches into LAD and cicrumflex. Large caliber vessels with no significant stenosis. No intimal irregularities in RCA. Elevated LVEDP at 40 - ECHO 08/12/2010 [facility], [place]: EF <24% - ECHO 09/27/2010 [place], [place]: EF 15% with moderate RVE with marked dysfunction, moderate MR/TR, trace AR, mild PR, moderate LV dilation, moderate RV dilation, Severe LAE, mild RAE.  - ECHO 01/08/2012 [place]: EF <25%, Grade 1 DD, MR/TR significantly improved. - ECHO 09/21/2011 [place]: EF 45-55%, Grade 1 DD, RV mildly dilated and reduced, Trace MR/PR/TR, no AR. - ECHO 03/21/2012 [place]: EF 45%, Grade 1 DD, Trace MR,PR,TR.  - ECHO 12/16: EF 50-55%, mild LVH.  - ECHO 1/18: EF 40-45%, diffuse HK, mildly dilated LV, mild diffuse hypokinesis.  2. Type II diabetes 3. Right humeral fracture (12/16).  4. Orthostatic hypotension.  5. Nephrolithiasis: s/p lithotripsy 8/18.  6. Right shoulder osteomyelitis, MSSA  Current Outpatient Medications  Medication Sig Dispense Refill  . albuterol (PROVENTIL HFA;VENTOLIN HFA) 108 (90 Base) MCG/ACT inhaler Inhale 2 puffs into the lungs every 6 (six) hours as needed for wheezing or shortness of breath.    . carvedilol (COREG) 6.25 MG  tablet Take 1 tablet (6.25 mg total) by mouth 2 (two) times daily with a meal. 180 tablet 3  . cephALEXin (KEFLEX) 500 MG capsule Take 500 mg by mouth 3 (three) times daily.    Marland Kitchen glipiZIDE (GLUCOTROL XL) 5 MG 24 hr tablet Take 5 mg by mouth daily with breakfast.     . glucose monitoring kit (FREESTYLE) monitoring kit 1 each by Does not apply route 4 (four) times daily -  after meals and at bedtime. 1 month Diabetic Testing Supplies for QAC-QHS accuchecks. 1 each 1  . hydrocortisone cream 1 % Apply 1 application topically 2 (two) times daily as needed for itching.    Marland Kitchen lisinopril (PRINIVIL,ZESTRIL) 2.5 MG tablet Take 1 tablet (2.5 mg total) by mouth daily. 90 tablet 3  . Multiple Vitamin (MULTIVITAMIN WITH MINERALS) TABS tablet Take 1 tablet by mouth daily. 30 tablet 0  . furosemide (LASIX) 40 MG tablet Take 1 tablet (40 mg total) by mouth 2 (two) times daily. 30 tablet 2  . ibuprofen (ADVIL,MOTRIN) 200 MG tablet Take 400-600 mg by mouth every 8 (eight) hours as needed (for pain/headaches.).    Marland Kitchen potassium chloride SA (K-DUR,KLOR-CON) 20 MEQ tablet Take 15mq (2 tabs) every morning and 254m (1 tab) every evening 270 tablet 3  . spironolactone (ALDACTONE) 25 MG tablet Take 25 mg by mouth daily.     No current facility-administered medications for this encounter.    BP 112/70   Pulse 90   Wt 125.9 kg (277 lb 9.6 oz)   SpO2 93%   BMI 33.13 kg/m    Wt Readings from Last 3 Encounters:  09/24/18 125.9 kg (277 lb 9.6 oz)  08/06/18 123.8 kg (273 lb)  07/18/18 122.9 kg (271 lb)    General: NAD Neck: JVP 8-9 cm with HJR, no thyromegaly or thyroid nodule.  Lungs: Clear to auscultation bilaterally with normal respiratory effort. CV: Nondisplaced PMI.  Heart regular S1/S2, no S3/S4, no murmur.  1+ edema to knees bilaterally.  No carotid bruit.  Normal pedal pulses.  Abdomen: Soft, nontender, no hepatosplenomegaly, no distention.  Skin: Intact without lesions or rashes.  Neurologic: Alert and oriented x 3.  Psych: Normal affect. Extremities: No clubbing or cyanosis.  HEENT: Normal.   Assessment/Plan: 1. Chronic systolic CHF: Nonischemic cardiomyopathy.  Most recent echo in 1/18 with EF 40-45%, diffuse hypokinesis.  EF had been lower 2011-2013.  [place] NYHA class II-III symptoms, worse last few weeks.  He is volume overloaded with weight up.  -  Increase Lasix to 40 mg bid.  Increase KCl to 40 qam/20 qpm.  BMET/BNP today, repeat BMET in 10 days.   - Continue Coreg 6.25 mg bid.  - Continue spironolactone 25 mg daily.    - Continue lisinopril 2.5 mg qhs.  - If BP remains stable, transition from lisinopril to Entresto at next appointment.  - He will need an echo, ordered today.      2. Right shoulder osteomyelitis: MSSA.  Plan for operative debridement with Dr. HaMarcelino Scot Would like to see repeat echo and some diuresis prior to operation.   See NP/PA in 2 wks to followup on volume status.    DaLoralie ChampagneMD  09/25/2018

## 2018-09-27 ENCOUNTER — Ambulatory Visit (HOSPITAL_COMMUNITY)
Admission: RE | Admit: 2018-09-27 | Discharge: 2018-09-27 | Disposition: A | Discharge status: Home / Self Care | Payer: 59 | Source: Ambulatory Visit | Attending: Family Medicine | Admitting: Family Medicine

## 2018-09-27 DIAGNOSIS — I080 Rheumatic disorders of both mitral and aortic valves: Secondary | ICD-10-CM | POA: Insufficient documentation

## 2018-09-27 DIAGNOSIS — E119 Type 2 diabetes mellitus without complications: Secondary | ICD-10-CM | POA: Insufficient documentation

## 2018-09-27 DIAGNOSIS — R0600 Dyspnea, unspecified: Secondary | ICD-10-CM | POA: Insufficient documentation

## 2018-09-27 DIAGNOSIS — I5022 Chronic systolic (congestive) heart failure: Secondary | ICD-10-CM | POA: Diagnosis not present

## 2018-09-27 NOTE — Progress Notes (Signed)
  Echocardiogram 2D Echocardiogram has been performed.  Pieter Partridge 09/27/2018, 9:00 AM

## 2018-10-02 ENCOUNTER — Telehealth (HOSPITAL_COMMUNITY): Payer: Self-pay

## 2018-10-02 NOTE — Telephone Encounter (Signed)
Pt called back, aware of results of ECHO. Has a f/u next with NP.  Pt to discuss further with NP.

## 2018-10-02 NOTE — Telephone Encounter (Signed)
Notes recorded by Laurey Morale, MD on 09/28/2018 at 4:10 PM EST EF 25-30% with normal RV.    Pt called back for ECHO results. Unable to contact patient, LM on voicemail to return call to office.

## 2018-10-08 ENCOUNTER — Ambulatory Visit: Payer: Self-pay | Admitting: Internal Medicine

## 2018-10-09 ENCOUNTER — Ambulatory Visit (HOSPITAL_COMMUNITY)
Admission: RE | Admit: 2018-10-09 | Discharge: 2018-10-09 | Disposition: A | Discharge status: Home / Self Care | Payer: Self-pay | Source: Ambulatory Visit | Attending: Internal Medicine | Admitting: Internal Medicine

## 2018-10-09 ENCOUNTER — Encounter (HOSPITAL_COMMUNITY): Payer: Self-pay

## 2018-10-09 ENCOUNTER — Other Ambulatory Visit: Payer: Self-pay

## 2018-10-09 VITALS — BP 110/72 | HR 83 | Wt 267.0 lb

## 2018-10-09 DIAGNOSIS — M009 Pyogenic arthritis, unspecified: Secondary | ICD-10-CM

## 2018-10-09 DIAGNOSIS — I428 Other cardiomyopathies: Secondary | ICD-10-CM | POA: Insufficient documentation

## 2018-10-09 DIAGNOSIS — Z79899 Other long term (current) drug therapy: Secondary | ICD-10-CM | POA: Insufficient documentation

## 2018-10-09 DIAGNOSIS — Z8249 Family history of ischemic heart disease and other diseases of the circulatory system: Secondary | ICD-10-CM | POA: Insufficient documentation

## 2018-10-09 DIAGNOSIS — B9561 Methicillin susceptible Staphylococcus aureus infection as the cause of diseases classified elsewhere: Secondary | ICD-10-CM | POA: Insufficient documentation

## 2018-10-09 DIAGNOSIS — E119 Type 2 diabetes mellitus without complications: Secondary | ICD-10-CM | POA: Insufficient documentation

## 2018-10-09 DIAGNOSIS — Z95810 Presence of automatic (implantable) cardiac defibrillator: Secondary | ICD-10-CM

## 2018-10-09 DIAGNOSIS — M869 Osteomyelitis, unspecified: Secondary | ICD-10-CM | POA: Insufficient documentation

## 2018-10-09 DIAGNOSIS — I5022 Chronic systolic (congestive) heart failure: Secondary | ICD-10-CM | POA: Insufficient documentation

## 2018-10-09 DIAGNOSIS — Z7984 Long term (current) use of oral hypoglycemic drugs: Secondary | ICD-10-CM | POA: Insufficient documentation

## 2018-10-09 LAB — BASIC METABOLIC PANEL
Anion gap: 9 (ref 5–15)
BUN: 22 mg/dL — ABNORMAL HIGH (ref 6–20)
CO2: 23 mmol/L (ref 22–32)
Calcium: 9.3 mg/dL (ref 8.9–10.3)
Chloride: 105 mmol/L (ref 98–111)
Creatinine, Ser: 1.07 mg/dL (ref 0.61–1.24)
Glucose, Bld: 112 mg/dL — ABNORMAL HIGH (ref 70–99)
Potassium: 4.2 mmol/L (ref 3.5–5.1)
Sodium: 137 mmol/L (ref 135–145)

## 2018-10-09 MED ORDER — POTASSIUM CHLORIDE CRYS ER 20 MEQ PO TBCR: 40.0000 meq | EXTENDED_RELEASE_TABLET | Freq: Every day | ORAL | 3 refills | Status: DC

## 2018-10-09 MED ORDER — FUROSEMIDE 40 MG PO TABS: 40.0000 mg | ORAL_TABLET | Freq: Every day | ORAL | 2 refills | Status: DC

## 2018-10-09 MED ORDER — SACUBITRIL-VALSARTAN 24-26 MG PO TABS: 1.0000 | ORAL_TABLET | Freq: Two times a day (BID) | ORAL | 3 refills | Status: DC

## 2018-10-09 NOTE — Progress Notes (Signed)
1 bottle of Entresto 24/26 samples given. Lot # [id number]. Expiration 3/22

## 2018-10-09 NOTE — Progress Notes (Signed)
Advanced Heart Failure Clinic Note   Patient ID: Justin Bernard, male   DOB: Aug 19, 1960, 58 y.o.   MRN: [id number]  PCP: Marda Stalker, PA-C with [facility] Physician Cardiology: Dr. Aundra Dubin  58 y.o.with history of nonischemic cardiomyopathy returns for for followup of CHF.    CHF was first noted in 07/2010 per patient when he developed dyspnea. He was seen at [facility]. Of note, mother had CHF diagnosis around 63 and grandfather had CHF diagnosis around 61. Pt's disease thought to be NICM with clean cath as below. 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  Initially, LVAD and transplant were considered, but EF improved greatly on medical management as echoes below demonstrate. 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   Most recent echo in 1/18 with EF 40-45%, diffuse hypokinesis.   Recently, he has developed MSSA osteomyelitis of right shoulder and needs debridement.   Today he returns for HF follow up. Last visit lasix was increased to 40 mg twice a day. Overall feeling ok.  Having ongoing shoulder pain. Mild dyspnea with exertion.  Denies PND/Orthopnea. Appetite ok. No fever or chills. Weight at home 277-->267  pounds. Taking all medications.   Labs (12/16): K 3.5, creatinine 0.82, HCT 0.82 Labs (3/17): K 3.7, creatinine 0.67 Labs (02/2017): K 3.8, creatinine 0.91 Labs (8/18): K 3.8, creatinine 1.05, hgb 11.4 Labs (10/19): K 3.8, creatinine 1.31 Labs 09/24/18: K 3.9 creatinine 0.99   SH: He lives with his 2 nephews and son in [place].  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   FH: Mother with CHF around 47, grandfather with CHF around 16.   ROS: All systems reviewed and negative except as per HPI.   PMH:  1. Chronic systolic CHF:  Nonischemic cardiomyopathy, initially followed at [facility]. [place] 08/13/10 [facility], [place]: areas or  reversibility in anterior wall and lesser extent inferior wall. Dilated LV with EF 15% and severe hypokinesis. - LHC 08/31/10 [facility], [place]: Right dominant. Left main free of disease, branches into LAD and cicrumflex. Large caliber vessels with no significant stenosis. No intimal irregularities in RCA. Elevated LVEDP at 40 - ECHO 08/12/2010 [facility], [place]: EF <24% - ECHO 09/27/2010 [place]: EF 15% with moderate RVE with marked dysfunction, moderate MR/TR, trace AR, mild PR, moderate LV dilation, moderate RV dilation, Severe LAE, mild RAE.  - ECHO 01/08/2012 [place]: EF <25%, Grade 1 DD, MR/TR significantly improved. - ECHO 09/21/2011 [place]: EF 45-55%, Grade 1 DD, RV mildly dilated and reduced, Trace MR/PR/TR, no AR. - ECHO 03/21/2012 [place]: EF 45%, Grade 1 DD, Trace MR,PR,TR.  - ECHO 12/16: EF 50-55%, mild LVH.  - ECHO 1/18: EF 40-45%, diffuse HK, mildly dilated LV, mild diffuse hypokinesis.  -ECHO 09/2018: EF 25-30% RV normal.  2. Type II diabetes 3. Right humeral fracture (12/16).  4. Orthostatic hypotension.  5. Nephrolithiasis: s/p lithotripsy 8/18.  6. Right shoulder osteomyelitis, MSSA  Current Outpatient Medications  Medication Sig Dispense Refill  . albuterol (PROVENTIL HFA;VENTOLIN HFA) 108 (90 Base) MCG/ACT inhaler Inhale 2 puffs into the lungs every 6 (six) hours as needed for wheezing or shortness of breath.    . carvedilol (COREG) 6.25 MG tablet Take 1 tablet (6.25 mg total) by mouth 2 (two) times daily with a meal. 180 tablet 3  . cephALEXin (KEFLEX) 500 MG capsule Take 500 mg by mouth 3 (three) times daily.    Marland Kitchen  furosemide (LASIX) 40 MG tablet Take 1 tablet (40 mg total) by mouth 2 (two) times daily. 30 tablet 2  . glipiZIDE (GLUCOTROL XL) 5 MG 24 hr tablet Take 5 mg by mouth daily with breakfast.     . glucose monitoring kit (FREESTYLE) monitoring kit 1 each by Does not apply route 4 (four) times daily - after meals and at bedtime. 1 month Diabetic  Testing Supplies for QAC-QHS accuchecks. 1 each 1  . hydrocortisone cream 1 % Apply 1 application topically 2 (two) times daily as needed for itching.    Marland Kitchen ibuprofen (ADVIL,MOTRIN) 200 MG tablet Take 400-600 mg by mouth every 8 (eight) hours as needed (for pain/headaches.).    Marland Kitchen lisinopril (PRINIVIL,ZESTRIL) 2.5 MG tablet Take 1 tablet (2.5 mg total) by mouth daily. 90 tablet 3  . Multiple Vitamin (MULTIVITAMIN WITH MINERALS) TABS tablet Take 1 tablet by mouth daily. 30 tablet 0  . potassium chloride SA (K-DUR,KLOR-CON) 20 MEQ tablet Take 86mq (2 tabs) every morning and 2104m (1 tab) every evening 270 tablet 3  . spironolactone (ALDACTONE) 25 MG tablet Take 25 mg by mouth daily.     No current facility-administered medications for this encounter.    BP 110/72   Pulse 83   Wt 121.1 kg (267 lb)   SpO2 96%   BMI 31.87 kg/m    Wt Readings from Last 3 Encounters:  10/09/18 121.1 kg (267 lb)  09/24/18 125.9 kg (277 lb 9.6 oz)  08/06/18 123.8 kg (273 lb)    General:  Well appearing. No resp difficulty HEENT: normal Neck: supple. JVP 6-7. Carotids 2+ bilat; no bruits. No lymphadenopathy or thryomegaly appreciated. Cor: PMI nondisplaced. Regular rate & rhythm. No rubs, gallops or murmurs. Lungs: clear Abdomen: soft, nontender, nondistended. No hepatosplenomegaly. No bruits or masses. Good bowel sounds. Extremities: no cyanosis, clubbing, rash, R and LLE trace edema.  Neuro: alert & orientedx3, cranial nerves grossly intact. moves all 4 extremities w/o difficulty. Affect pleasant or cyanosis.  HEENT: Normal.   Assessment/Plan: 1. Chronic systolic CHF: Nonischemic cardiomyopathy.  ECHO 1/18 with EF 40-45%, diffuse hypokinesis. Repeat ECHO 2020 EF down to 25-30% with normal RV -[employer].  -NYHA II-III. Volume status much improved. Cut back lasix to 40 mg daily and cut back  potassium to 40 meq daily. I am cutting back diuretic to add entresto.  - Stop lisinopril and allow 36  hour washout. He will then start entresto 24-26 mg twice a day. Plan to follow up next week for BP check and BMET.   - Continue Coreg 6.25 mg bid.  - Continue spironolactone 25 mg daily.  -Discussed ECHO results and purpose of entresto.         2. Right shoulder osteomyelitis: MSSA.  Plan for operative debridement with Dr. HaMarcelino Scot Discussed with Dr McAundra DubinOk to have shoulder surgery. He is at moderate risk for perioperative complication. Would consult HF team to follow during hospital stay.   Follow up in 7 days to check BMET and BP. Follow up in  Greater than 50% of the (total minutes 30 ) visit spent in counseling/coordination of care regarding the above medication changes.      AmDarrick GrinderNP  10/09/2018

## 2018-10-09 NOTE — Patient Instructions (Signed)
Lab work done today. We will notify you of any abnormal lab work.  Lab work will need to be done again in 7 days with a Nurse Visit  STOP Lisinopril  START Entresto 24/26 (1 tab) twice daily START TOMORROW EVENING.    DECREASE Furosemide 40mg  (1 tab) daily  DECREASE Potassium (2 tabs) daily  Follow up with the Advanced Heart Failure Clinic in 6 weeks.

## 2018-10-11 ENCOUNTER — Telehealth (HOSPITAL_COMMUNITY): Payer: Self-pay | Admitting: Licensed Clinical Social Worker

## 2018-10-11 NOTE — Telephone Encounter (Signed)
CSW contacted patient due to interest in attending the [facility]'s group. Message left for return call. Justin Beech, LCSW, CCSW-MCS [phone]

## 2018-10-16 ENCOUNTER — Ambulatory Visit (INDEPENDENT_AMBULATORY_CARE_PROVIDER_SITE_OTHER): Payer: Self-pay | Admitting: *Deleted

## 2018-10-16 ENCOUNTER — Ambulatory Visit (HOSPITAL_COMMUNITY)
Admission: RE | Admit: 2018-10-16 | Discharge: 2018-10-16 | Disposition: A | Discharge status: Home / Self Care | Payer: Medicaid Other | Source: Ambulatory Visit | Attending: Cardiology | Admitting: Cardiology

## 2018-10-16 DIAGNOSIS — I429 Cardiomyopathy, unspecified: Secondary | ICD-10-CM

## 2018-10-16 DIAGNOSIS — I5022 Chronic systolic (congestive) heart failure: Secondary | ICD-10-CM | POA: Insufficient documentation

## 2018-10-16 LAB — BASIC METABOLIC PANEL
Anion gap: 8 (ref 5–15)
BUN: 20 mg/dL (ref 6–20)
CO2: 20 mmol/L — ABNORMAL LOW (ref 22–32)
CREATININE: 1.14 mg/dL (ref 0.61–1.24)
Calcium: 9.2 mg/dL (ref 8.9–10.3)
Chloride: 111 mmol/L (ref 98–111)
GFR calc Af Amer: >60 mL/min (ref >60)
GFR calc non Af Amer: >60 mL/min (ref >60)
Glucose, Bld: 98 mg/dL (ref 70–99)
Potassium: 4.1 mmol/L (ref 3.5–5.1)
Sodium: 139 mmol/L (ref 135–145)

## 2018-10-16 NOTE — Progress Notes (Signed)
Pt came in for bp check and labs per DM.

## 2018-10-17 LAB — CUP PACEART REMOTE DEVICE CHECK
Battery Remaining Longevity: 60 mo
Battery Remaining Percentage: 64 %
Brady Statistic RV Percent Paced: 0 %
Date Time Interrogation Session: 20200303124200
HighPow Impedance: 56 Ohm
Implantable Lead Implant Date: 20120222
Implantable Lead Location: 753860
Implantable Lead Serial Number: 100951
Implantable Pulse Generator Implant Date: 20120222
Lead Channel Impedance Value: 389 Ohm
Lead Channel Pacing Threshold Amplitude: 1.1 V
Lead Channel Pacing Threshold Pulse Width: 0.5 ms
Lead Channel Setting Pacing Amplitude: 2.5 V
Lead Channel Setting Pacing Pulse Width: 0.5 ms
Lead Channel Setting Sensing Sensitivity: 0.6 mV
Pulse Gen Serial Number: 100238

## 2018-10-23 NOTE — Progress Notes (Signed)
Remote ICD transmission.   

## 2018-11-16 ENCOUNTER — Telehealth (HOSPITAL_COMMUNITY): Payer: Self-pay | Admitting: Adult Health

## 2018-11-16 NOTE — Telephone Encounter (Signed)
Attempted to reach PT via telephone number listed.  Left v/m re: tele-he;ath options for appt scheduled 4/7 @ 11am. I will continue to attempt to make contact with the PT re: said appt.

## 2018-11-18 ENCOUNTER — Other Ambulatory Visit (HOSPITAL_COMMUNITY): Payer: Self-pay | Admitting: Cardiology

## 2018-11-20 ENCOUNTER — Encounter (HOSPITAL_COMMUNITY): Payer: Self-pay

## 2018-11-27 ENCOUNTER — Other Ambulatory Visit: Payer: Self-pay | Admitting: Internal Medicine

## 2018-11-27 DIAGNOSIS — M009 Pyogenic arthritis, unspecified: Secondary | ICD-10-CM

## 2018-11-27 MED ORDER — CEPHALEXIN 500 MG PO CAPS: 500.0000 mg | ORAL_CAPSULE | Freq: Three times a day (TID) | ORAL | 3 refills | Status: DC

## 2018-11-27 NOTE — Progress Notes (Signed)
Justin Bernard,  Since it may be a few more months before the coronavirus epidemic settles down, I think it would be best to put you back on cephalexin again to suppress your symptoms.  I sent refills to your pharmacy.  [name]

## 2019-01-12 ENCOUNTER — Other Ambulatory Visit (HOSPITAL_COMMUNITY): Payer: Self-pay | Admitting: Adult Health

## 2019-01-15 ENCOUNTER — Encounter: Payer: Self-pay | Admitting: *Deleted

## 2019-01-16 ENCOUNTER — Telehealth: Payer: Self-pay

## 2019-01-16 NOTE — Telephone Encounter (Signed)
Left message for patient to remind of missed remote transmission.  

## 2019-02-06 ENCOUNTER — Other Ambulatory Visit (HOSPITAL_COMMUNITY): Payer: Self-pay | Admitting: Cardiology

## 2019-02-13 ENCOUNTER — Other Ambulatory Visit (HOSPITAL_COMMUNITY): Payer: Self-pay

## 2019-02-18 ENCOUNTER — Ambulatory Visit (INDEPENDENT_AMBULATORY_CARE_PROVIDER_SITE_OTHER): Payer: Self-pay | Admitting: *Deleted

## 2019-02-18 DIAGNOSIS — I429 Cardiomyopathy, unspecified: Secondary | ICD-10-CM

## 2019-02-19 LAB — CUP PACEART REMOTE DEVICE CHECK
Battery Remaining Longevity: 54 mo
Battery Remaining Percentage: 58 %
Brady Statistic RV Percent Paced: 0 %
Date Time Interrogation Session: 20200702124200
HighPow Impedance: 66 Ohm
Implantable Lead Implant Date: 20120222
Implantable Lead Location: 753860
Implantable Lead Model: 296
Implantable Lead Serial Number: 100951
Implantable Pulse Generator Implant Date: 20120222
Lead Channel Impedance Value: 457 Ohm
Lead Channel Pacing Threshold Amplitude: 1.1 V
Lead Channel Pacing Threshold Pulse Width: 0.5 ms
Lead Channel Setting Pacing Amplitude: 2.5 V
Lead Channel Setting Pacing Pulse Width: 0.5 ms
Lead Channel Setting Sensing Sensitivity: 0.6 mV
Pulse Gen Serial Number: 100238

## 2019-02-21 ENCOUNTER — Other Ambulatory Visit: Payer: Self-pay

## 2019-02-25 ENCOUNTER — Encounter: Payer: Self-pay | Admitting: Cardiology

## 2019-02-25 NOTE — Progress Notes (Signed)
Remote ICD transmission.   

## 2019-04-08 ENCOUNTER — Other Ambulatory Visit (HOSPITAL_COMMUNITY): Payer: Self-pay | Admitting: Adult Health

## 2019-04-17 ENCOUNTER — Telehealth: Payer: Self-pay | Admitting: *Deleted

## 2019-04-17 NOTE — Telephone Encounter (Signed)
LMOVM requesting call back from patient. Direct number given.  Latitude alert received for VT event on 03/16/19 at 08:09, RID template does not correlate, R-R intervals variable. Event eventually accelerated into VF zone, ATP x1 unsuccessful, 41J shock delivered, V rate slowed and became more regular. 10 VT-1 (monitor zone) episodes also noted on 8/1 between 07:08-08:15, most with irregular R-R intervals and poor RID template correlation. No history of AF per notes. Pt is overdue for f/u with Dr. Lovena Le as of 02/2018.

## 2019-04-24 IMAGING — XA IR URETURAL STENT LEFT NEW ACCESS W/O SEP NEPHROSTOMY CATH
1 series · 5 of 5 positions shown · non-contrast
Comparison: None.

INDICATION: Partially obstructing staghorn calculus

EXAM:
ULTRASOUND and FLUOROSCOPIC LEFT NEPHROSTOMY ACCESS PRIOR TO
OPERATIVE NEPHROLITHOTOMY

[Series 300: ir ureteral stent left new access w/o se · non-contrast · 5 of 5 slices shown]
[im 1/5]
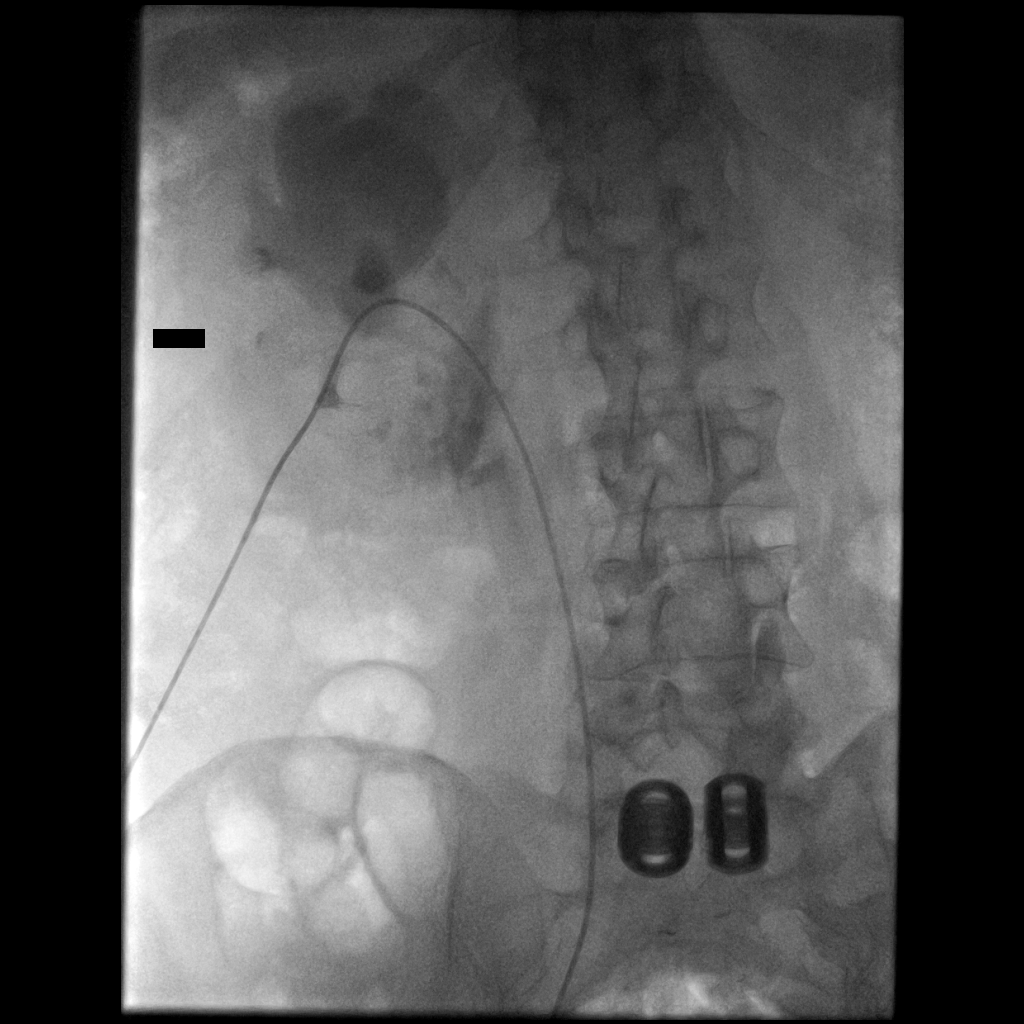
[im 2/5]
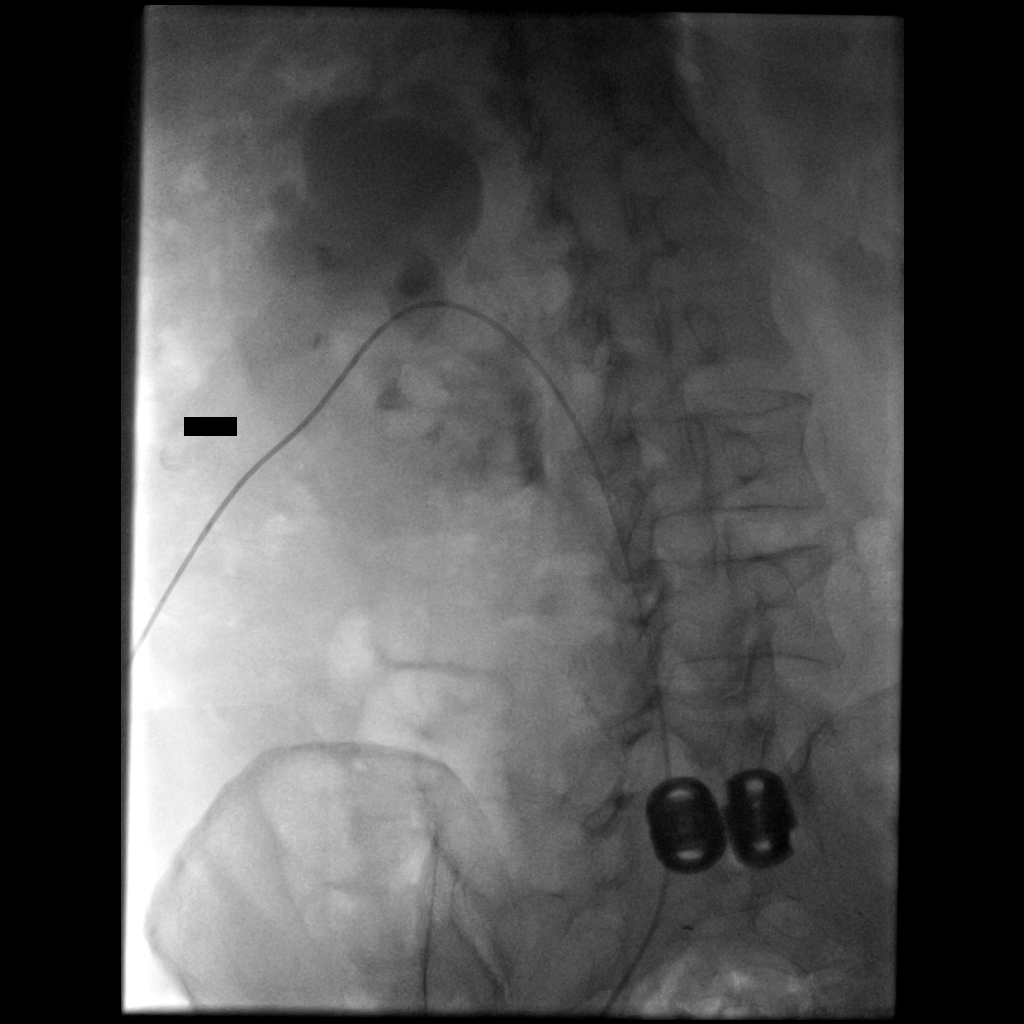
[im 3/5]
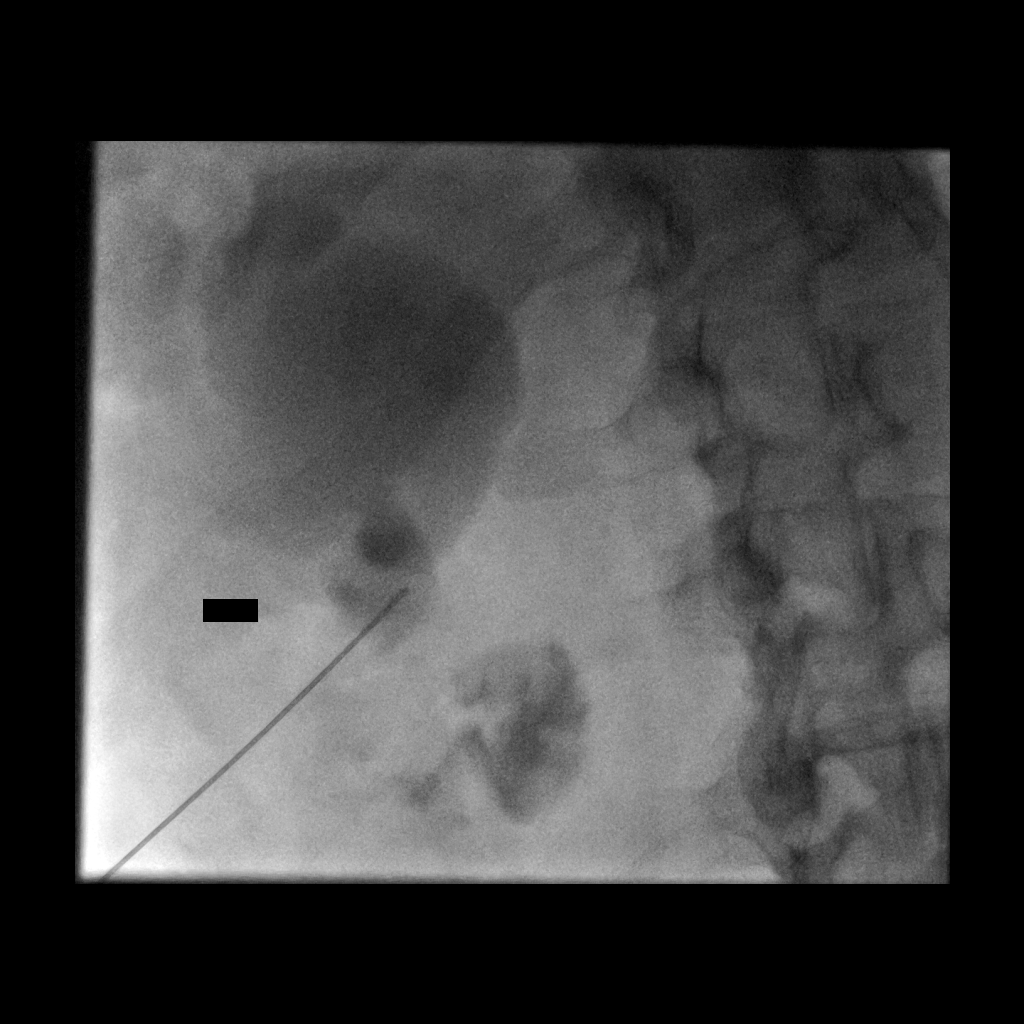
[im 4/5]
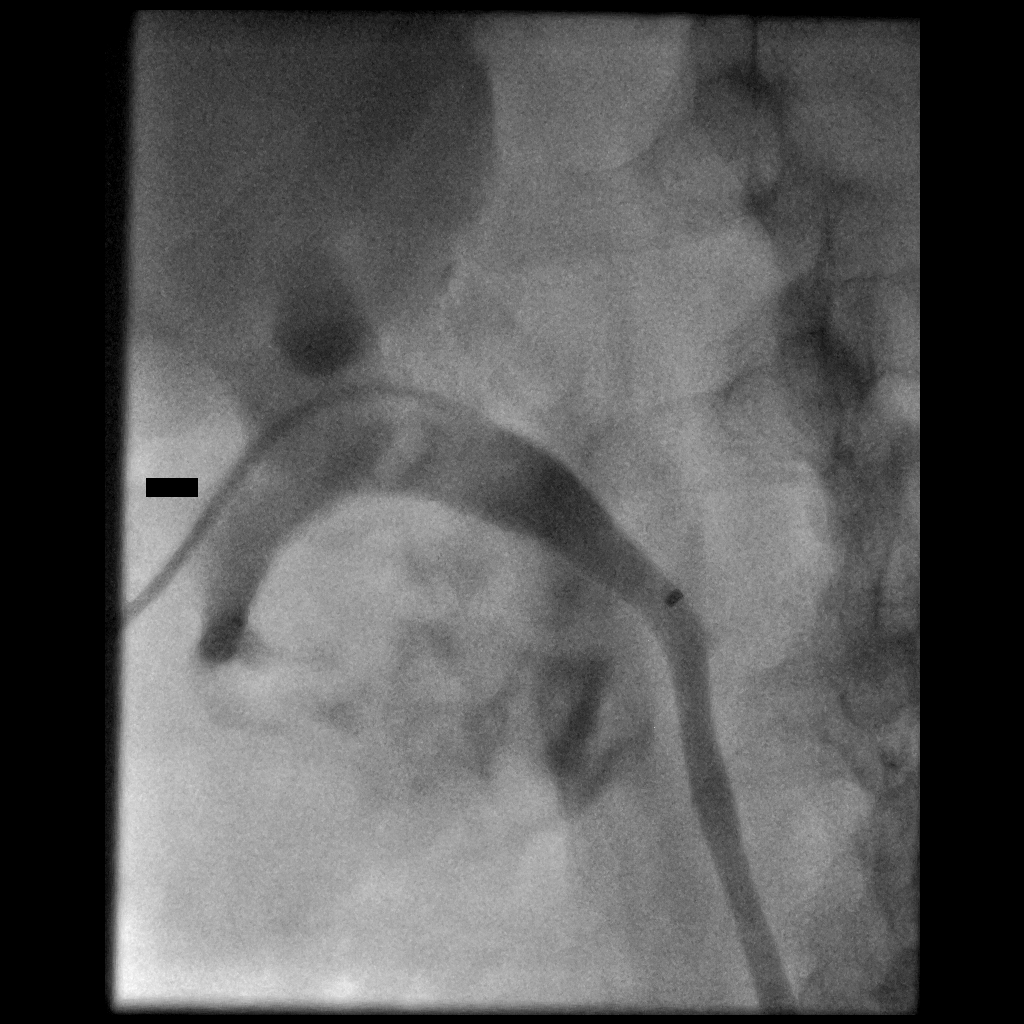
[im 5/5]
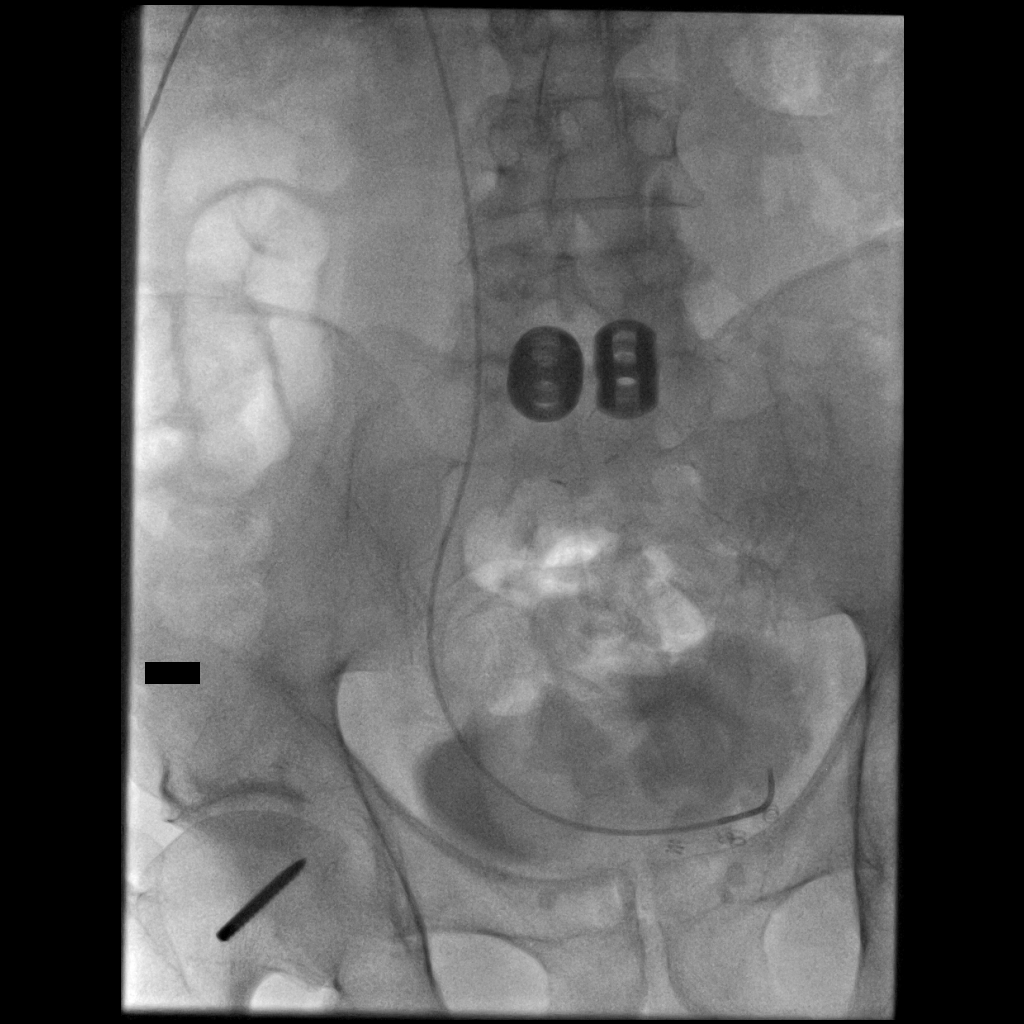

[5 of 5 positions shown; findings below may reference images not displayed]

MEDICATIONS:
Ciprofloxacin 400 mg IV; The antibiotic was administered in an
appropriate time frame prior to skin puncture.

ANESTHESIA/SEDATION:
Fentanyl 150 mcg IV; Versed 4.0 mg IV

Moderate Sedation Time:  60 MINUTES

The patient was continuously monitored during the procedure by the
interventional radiology nurse under my direct supervision.

CONTRAST:  20 CC [id number] - administered into the collecting
system(s)

FLUOROSCOPY TIME:  Fluoroscopy Time: 17 minutes 54 seconds (645
mGy).

COMPLICATIONS:
None immediate.

PROCEDURE:
Informed written consent was obtained from the patient after a
thorough discussion of the procedural risks, benefits and
alternatives. All questions were addressed. Maximal Sterile Barrier
Technique was utilized including caps, mask, sterile gowns, sterile
gloves, sterile drape, hand hygiene and skin antiseptic. A timeout
was performed prior to the initiation of the procedure.

Under sterile conditions and local anesthesia, eventually the 21
gauge 15 cm percutaneous access needle was advanced to the midpole
staghorn calculus. Contrast injection opacifies the obstructed upper
pole. Eventually there is contrast opacification of the renal pelvis
and ureter.

018 guidewire was coiled along the staghorn calculus. Accustick
dilator set advanced. Through the outer dilator, a Bentson guidewire
was passed into the renal pelvis and down the ureter. This allowed
advancement of the catheter access into the renal pelvis. Guidewire
removed. Contrast injection confirms position. Bentson guidewire
inserted into the bladder followed by a 5 French Kumpe the catheter.
Catheter was secured externally for nephrostomy access prior to
operative nephrolithotomy. Images obtained for documentation.
IMPRESSION: Successful ultrasound and fluoroscopic left nephrostomy access prior
to operative nephrolithotomy.

## 2019-04-24 IMAGING — XA DG C-ARM 61-120 MIN-NO REPORT
4 series · 4 of 4 positions shown · non-contrast
Comparison: none

[Series 2: uro standard · 1 of 1 slices shown (1 of 4)]
[im 1/1]
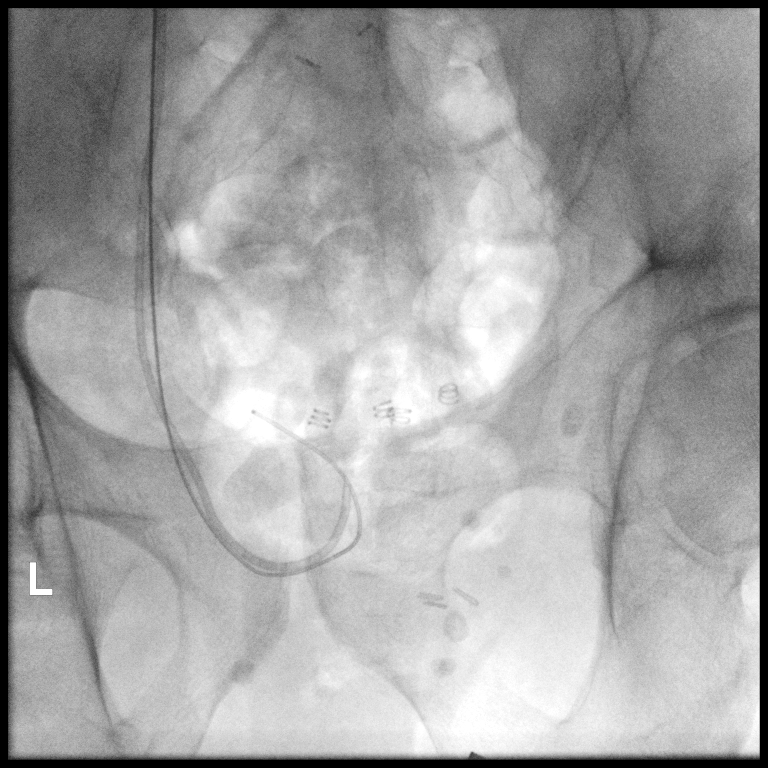

[Series 3: uro standard · 1 of 1 slices shown (2 of 4)]
[im 1/1]
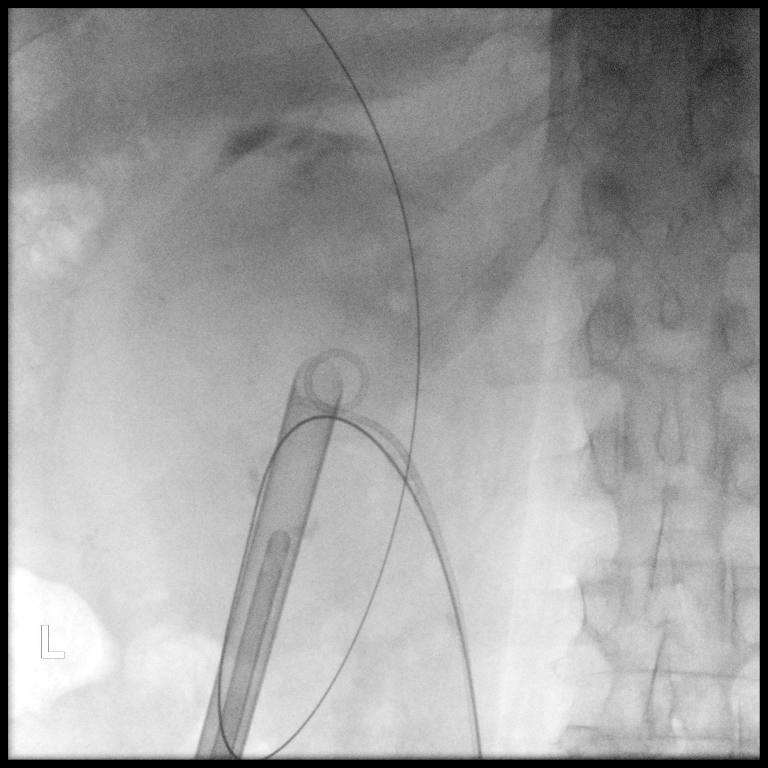

[Series 6: uro standard · 1 of 1 slices shown (3 of 4)]
[im 1/1]
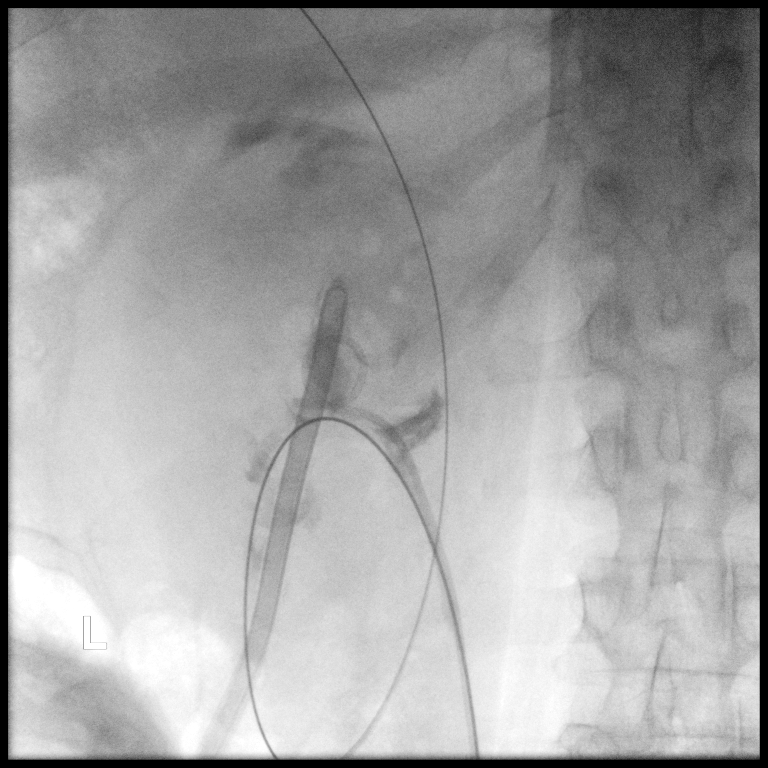

[Series 7: uro standard · 1 of 1 slices shown (4 of 4)]
[im 1/1]
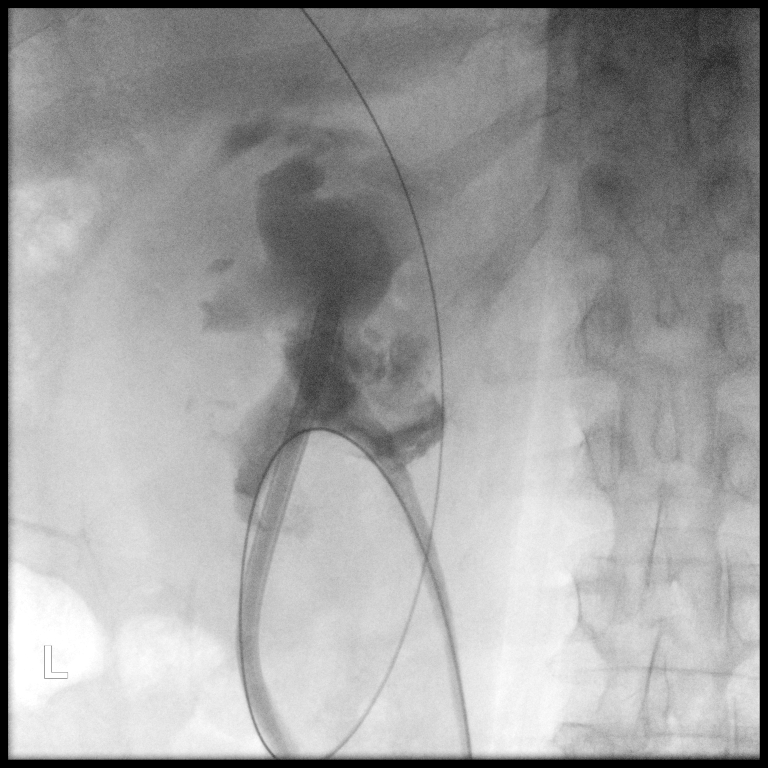

[4 of 4 positions shown; findings below may reference images not displayed]

:
Fluoroscopy was utilized by the requesting physician. No
radiographic interpretation.

## 2019-04-24 NOTE — Telephone Encounter (Signed)
LMOM. Need to F/U on alert and patient overdue for office visit with Dr Lovena Le since 02/2018.

## 2019-04-24 NOTE — Telephone Encounter (Signed)
LMOVM requesting call back to DC. Direct number given. 

## 2019-04-24 NOTE — Telephone Encounter (Signed)
Patient returned to call.  °

## 2019-04-29 NOTE — Telephone Encounter (Signed)
Third attempt:  LMOVM requesting call back to DC. Direct number given.

## 2019-05-02 NOTE — Telephone Encounter (Signed)
Fourth attempt:  LMOVM requesting call back to DC. Direct number given.

## 2019-05-08 NOTE — Telephone Encounter (Signed)
Per last OV in Feb Lisinopril was changed to [place].  Unsure when pt switched back to Lisinopril as there is no note or mention of it in his chart.  Attempted to call pt to discuss w/him and Left message to call back
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
## 2019-05-09 ENCOUNTER — Telehealth (HOSPITAL_COMMUNITY): Payer: Self-pay

## 2019-05-09 NOTE — Telephone Encounter (Signed)
Pt left vm asking for refill for lisinopril however not on med list.  Last ov note states patient should have stopped lisnopril and started entresto back in February. LM for patient to call office to discuss

## 2019-05-10 NOTE — Telephone Encounter (Signed)
He needs followup with me in the office next week.  May need to involve social work given lack of insurance.  Regardless, needs appt.

## 2019-05-10 NOTE — Telephone Encounter (Addendum)
Patient was taking Lisinipril 2.5 mg daily and reports Dr . Aundra Dubin prescribed meds. No insurance and asked what visit will cost . Will contact billing and get estimate for patient visit.    Estimate for visit with device check is approximately $500.  Will forward to CHF clinic for meds and to assist with referral for assistance with medical costs due to no insurance coverage.

## 2019-05-12 NOTE — Telephone Encounter (Signed)
noted 

## 2019-05-13 ENCOUNTER — Telehealth (HOSPITAL_COMMUNITY): Payer: Self-pay | Admitting: Licensed Clinical Social Worker

## 2019-05-13 MED ORDER — FUROSEMIDE 40 MG PO TABS: 40.0000 mg | ORAL_TABLET | Freq: Every day | ORAL | 0 refills | Status: DC

## 2019-05-13 MED ORDER — CARVEDILOL 6.25 MG PO TABS: 6.2500 mg | ORAL_TABLET | Freq: Two times a day (BID) | ORAL | 0 refills | Status: DC

## 2019-05-13 NOTE — Telephone Encounter (Signed)
Pt spoke with sw to assist with insurance and medication access.  He is filling out paper work for same.  Called patient, he notes he has been out of lisnopril for 2 weeks and have not been on Entresto since ordered in February due to cost.  D/w Dr Aundra Dubin, pt will start Entresto 24/46mg  and rtc to clinic tomorrow for samples.  He will also bring in applications to be reviewed with SW. Office staff will assist patient with getting an appt with Dr Aundra Dubin soon.    Medication Samples have been provided to the patient.  Drug name: Leticia Clas       Strength: 24/26mg         Qty: 56  LOT: [id number]  Exp.Date: 7/22  Dosing instructions: 1 tab twice a day  The patient has been instructed regarding the correct time, dose, and frequency of taking this medication, including desired effects and most common side effects.   Valeda Malm 3:43 PM 05/13/2019

## 2019-05-13 NOTE — Addendum Note (Signed)
Addended by: Valeda Malm on: 05/13/2019 03:47 PM   Modules accepted: Orders

## 2019-05-13 NOTE — Telephone Encounter (Signed)
CSW consulted to speak with pt regarding insurance concerns.  CSW spoke with pt regarding current lack of insurance.  Pt with Medicaid Family planning at this time which only covers procedures related to family planning.  Pt states he applied for Medicaid back in January but did not meet criteria for disability to qualify for full Medicaid benefits.  Patient used to be insured through his employer but he lost his job in June 2018- he paid for [employer] benefits for 18 months but then they ran out.  Patient has looked into getting insurance through the marketplace but states he was going to cost him over $1000/month- CSW encouraged him to look into this again when the marketplace opens back un in November in case the prices have changed.  CSW spoke with pt regarding the [employer]- as patient has no income and states he has used most of his retirement since he lost his job he may qualify for [employer] which can pay up to 100% of his [facility] bills depending on his eligibility.  CSW emailed patient an application to complete and return to [place] for review.  CSW also inquired about pt medications and how he was getting them despite lack of insurance.  Pt reports he has just been paying out of pocket but was unable to afford Entresto.  CSW informed pt of [employer] program which can get patient entresto for free if he qualifies and he is interested in applying- CSW also emailed pt this application.  CSW also requested that pt coreg and lasix be sent to [place] as they are in the $4 list (pt was paying $6 for lasix and $33 for coreg each month).  CSW will continue to follow and assist as needed  Jorge Ny, [facility] Desk#: [phone] Cell#: [phone]

## 2019-05-13 NOTE — Telephone Encounter (Signed)
Pt does not want to have an appt due to cost and lack of insurance.  Have sent message to [place], CSW to f/u with pt

## 2019-05-14 ENCOUNTER — Telehealth (HOSPITAL_COMMUNITY): Payer: Self-pay | Admitting: Licensed Clinical Social Worker

## 2019-05-14 ENCOUNTER — Encounter: Payer: Medicaid Other | Admitting: Internal Medicine

## 2019-05-14 NOTE — Telephone Encounter (Signed)
Pt brought in [employer] application and [employer] application to clinic.  CSW reviewed [employer] application and had patient make some corrections and then turned over to Pharmacy Patient Advocate to complete and turn in.  Pt did not have all documentation for [facility] Assistance program so he is going to scan and email to [place] prior to [place] turning in application.  CSW will continue to follow and assist as needed  Jorge Ny, [facility] Desk#: [phone] Cell#: [phone]

## 2019-05-15 ENCOUNTER — Telehealth: Payer: Self-pay | Admitting: Nurse Practitioner

## 2019-05-15 NOTE — Telephone Encounter (Signed)
   Pt called this evening to report that he felt lightheaded throughout the day yesterday after being resumed on carvedilol and entresto.  He felt better today but this evening, he was standing at his sink and apparently lost consciousness w/o any prodromal symptoms.  He regained consciousness on the floor and his mother attended to him.  He has no recollection of falling to the floor and does not think that he sustained any injuries.  He denies c/p or sob, and does not believe that his ICD fired.  He feels mildly lightheaded with position changes currently.  I rec that he take his bp and increase oral hydration this evening.  No further  blocker or entresto tonight.  I also asked him to submit a remote transmission for our device team to review, as he does have a h/o VT and ICD shock in August and per notes, was unaware of ICD shock at that time as well.   Caller verbalized understanding and was grateful for the call back.  He is actually scheduled for device clinic f/u on 10/12 and per phone notes, it appears that efforts are being made to get him scheduled back in CHF clinic.  In light of this evening's events, pending ICD eval, he will need CHF sooner rather than later for possible med adjustment.  Murray Hodgkins, NP 05/15/2019, 7:38 PM

## 2019-05-16 ENCOUNTER — Telehealth: Payer: Self-pay | Admitting: Student

## 2019-05-16 ENCOUNTER — Telehealth (HOSPITAL_COMMUNITY): Payer: Self-pay | Admitting: Licensed Clinical Social Worker

## 2019-05-16 ENCOUNTER — Other Ambulatory Visit: Payer: Self-pay

## 2019-05-16 ENCOUNTER — Telehealth (HOSPITAL_COMMUNITY): Payer: Self-pay | Admitting: Pharmacist

## 2019-05-16 ENCOUNTER — Inpatient Hospital Stay (HOSPITAL_COMMUNITY)
Admission: EM | Admit: 2019-05-16 | Discharge: 2019-05-18 | DRG: 309 | Disposition: A | Discharge status: Home / Self Care | Payer: Self-pay | Attending: Cardiology | Admitting: Cardiology

## 2019-05-16 ENCOUNTER — Inpatient Hospital Stay (HOSPITAL_COMMUNITY): Payer: Self-pay

## 2019-05-16 ENCOUNTER — Emergency Department (HOSPITAL_COMMUNITY): Payer: Self-pay

## 2019-05-16 ENCOUNTER — Telehealth (HOSPITAL_COMMUNITY): Payer: Self-pay

## 2019-05-16 DIAGNOSIS — Z79899 Other long term (current) drug therapy: Secondary | ICD-10-CM

## 2019-05-16 DIAGNOSIS — F419 Anxiety disorder, unspecified: Secondary | ICD-10-CM | POA: Diagnosis present

## 2019-05-16 DIAGNOSIS — R55 Syncope and collapse: Secondary | ICD-10-CM

## 2019-05-16 DIAGNOSIS — M1612 Unilateral primary osteoarthritis, left hip: Secondary | ICD-10-CM | POA: Diagnosis present

## 2019-05-16 DIAGNOSIS — M869 Osteomyelitis, unspecified: Secondary | ICD-10-CM | POA: Diagnosis present

## 2019-05-16 DIAGNOSIS — I951 Orthostatic hypotension: Secondary | ICD-10-CM | POA: Diagnosis present

## 2019-05-16 DIAGNOSIS — I472 Ventricular tachycardia, unspecified: Secondary | ICD-10-CM

## 2019-05-16 DIAGNOSIS — Z8249 Family history of ischemic heart disease and other diseases of the circulatory system: Secondary | ICD-10-CM

## 2019-05-16 DIAGNOSIS — G4700 Insomnia, unspecified: Secondary | ICD-10-CM | POA: Diagnosis present

## 2019-05-16 DIAGNOSIS — Z23 Encounter for immunization: Secondary | ICD-10-CM

## 2019-05-16 DIAGNOSIS — Z20828 Contact with and (suspected) exposure to other viral communicable diseases: Secondary | ICD-10-CM | POA: Diagnosis present

## 2019-05-16 DIAGNOSIS — Z95810 Presence of automatic (implantable) cardiac defibrillator: Secondary | ICD-10-CM

## 2019-05-16 DIAGNOSIS — I361 Nonrheumatic tricuspid (valve) insufficiency: Secondary | ICD-10-CM

## 2019-05-16 DIAGNOSIS — Z880 Allergy status to penicillin: Secondary | ICD-10-CM

## 2019-05-16 DIAGNOSIS — E1169 Type 2 diabetes mellitus with other specified complication: Secondary | ICD-10-CM | POA: Diagnosis present

## 2019-05-16 DIAGNOSIS — Z9119 Patient's noncompliance with other medical treatment and regimen: Secondary | ICD-10-CM

## 2019-05-16 DIAGNOSIS — Z7984 Long term (current) use of oral hypoglycemic drugs: Secondary | ICD-10-CM

## 2019-05-16 DIAGNOSIS — F1021 Alcohol dependence, in remission: Secondary | ICD-10-CM | POA: Diagnosis present

## 2019-05-16 DIAGNOSIS — I110 Hypertensive heart disease with heart failure: Secondary | ICD-10-CM | POA: Diagnosis present

## 2019-05-16 DIAGNOSIS — Z87442 Personal history of urinary calculi: Secondary | ICD-10-CM

## 2019-05-16 DIAGNOSIS — H269 Unspecified cataract: Secondary | ICD-10-CM | POA: Diagnosis present

## 2019-05-16 DIAGNOSIS — N179 Acute kidney failure, unspecified: Secondary | ICD-10-CM

## 2019-05-16 DIAGNOSIS — I5022 Chronic systolic (congestive) heart failure: Secondary | ICD-10-CM | POA: Diagnosis present

## 2019-05-16 DIAGNOSIS — B9561 Methicillin susceptible Staphylococcus aureus infection as the cause of diseases classified elsewhere: Secondary | ICD-10-CM | POA: Diagnosis present

## 2019-05-16 DIAGNOSIS — Z4502 Encounter for adjustment and management of automatic implantable cardiac defibrillator: Secondary | ICD-10-CM

## 2019-05-16 DIAGNOSIS — E876 Hypokalemia: Secondary | ICD-10-CM

## 2019-05-16 DIAGNOSIS — I4901 Ventricular fibrillation: Principal | ICD-10-CM | POA: Diagnosis present

## 2019-05-16 DIAGNOSIS — Z791 Long term (current) use of non-steroidal anti-inflammatories (NSAID): Secondary | ICD-10-CM

## 2019-05-16 DIAGNOSIS — I428 Other cardiomyopathies: Secondary | ICD-10-CM

## 2019-05-16 HISTORY — DX: Ventricular tachycardia, unspecified: I47.20

## 2019-05-16 HISTORY — DX: Ventricular fibrillation: I49.01

## 2019-05-16 HISTORY — DX: Ventricular tachycardia: I47.2

## 2019-05-16 HISTORY — DX: Osteomyelitis, unspecified: M86.9

## 2019-05-16 HISTORY — DX: Hypokalemia: E87.6

## 2019-05-16 LAB — CBC
HCT: 45 % (ref 39.0–52.0)
HCT: 41.3 % (ref 39.0–52.0)
Hemoglobin: 13.9 g/dL (ref 13.0–17.0)
Hemoglobin: 12.5 g/dL — ABNORMAL LOW (ref 13.0–17.0)
MCH: 26.4 pg (ref 26.0–34.0)
MCH: 25.9 pg — ABNORMAL LOW (ref 26.0–34.0)
MCHC: 30.9 g/dL (ref 30.0–36.0)
MCHC: 30.3 g/dL (ref 30.0–36.0)
MCV: 85.6 fL (ref 80.0–100.0)
MCV: 85.5 fL (ref 80.0–100.0)
Platelets: 301 10*3/uL (ref 150–400)
Platelets: 296 10*3/uL (ref 150–400)
RBC: 5.26 MIL/uL (ref 4.22–5.81)
RBC: 4.83 MIL/uL (ref 4.22–5.81)
RDW: 23.4 % — ABNORMAL HIGH (ref 11.5–15.5)
RDW: 23.2 % — ABNORMAL HIGH (ref 11.5–15.5)
WBC: 10.2 10*3/uL (ref 4.0–10.5)
WBC: 8.3 10*3/uL (ref 4.0–10.5)
nRBC: 0 % (ref 0.0–0.2)
nRBC: 0 % (ref 0.0–0.2)

## 2019-05-16 LAB — BASIC METABOLIC PANEL
Anion gap: 19 — ABNORMAL HIGH (ref 5–15)
Anion gap: 14 (ref 5–15)
BUN: 27 mg/dL — ABNORMAL HIGH (ref 6–20)
BUN: 27 mg/dL — ABNORMAL HIGH (ref 6–20)
CO2: 25 mmol/L (ref 22–32)
CO2: 27 mmol/L (ref 22–32)
Calcium: 8.9 mg/dL (ref 8.9–10.3)
Calcium: 8.5 mg/dL — ABNORMAL LOW (ref 8.9–10.3)
Chloride: 91 mmol/L — ABNORMAL LOW (ref 98–111)
Chloride: 91 mmol/L — ABNORMAL LOW (ref 98–111)
Creatinine, Ser: 1.95 mg/dL — ABNORMAL HIGH (ref 0.61–1.24)
Creatinine, Ser: 1.76 mg/dL — ABNORMAL HIGH (ref 0.61–1.24)
GFR calc Af Amer: 43 mL/min — ABNORMAL LOW (ref >60)
GFR calc Af Amer: 49 mL/min — ABNORMAL LOW (ref >60)
GFR calc non Af Amer: 37 mL/min — ABNORMAL LOW (ref >60)
GFR calc non Af Amer: 42 mL/min — ABNORMAL LOW (ref >60)
Glucose, Bld: 118 mg/dL — ABNORMAL HIGH (ref 70–99)
Glucose, Bld: 117 mg/dL — ABNORMAL HIGH (ref 70–99)
Potassium: 2.7 mmol/L — CL (ref 3.5–5.1)
Potassium: 3 mmol/L — ABNORMAL LOW (ref 3.5–5.1)
Sodium: 135 mmol/L (ref 135–145)
Sodium: 132 mmol/L — ABNORMAL LOW (ref 135–145)

## 2019-05-16 LAB — URINALYSIS, ROUTINE W REFLEX MICROSCOPIC
Bilirubin Urine: NEGATIVE
Glucose, UA: NEGATIVE mg/dL
Hgb urine dipstick: NEGATIVE
Ketones, ur: 5 mg/dL — AB
Nitrite: NEGATIVE
Protein, ur: 30 mg/dL — AB
Specific Gravity, Urine: 1.017 (ref 1.005–1.030)
pH: 6 (ref 5.0–8.0)

## 2019-05-16 LAB — ECHOCARDIOGRAM COMPLETE

## 2019-05-16 LAB — TROPONIN I (HIGH SENSITIVITY)
Troponin I (High Sensitivity): 55 ng/L — ABNORMAL HIGH (ref <18)
Troponin I (High Sensitivity): 41 ng/L — ABNORMAL HIGH (ref <18)
Troponin I (High Sensitivity): 39 ng/L — ABNORMAL HIGH (ref <18)

## 2019-05-16 LAB — SARS CORONAVIRUS 2 BY RT PCR (HOSPITAL ORDER, PERFORMED IN ~~LOC~~ HOSPITAL LAB): SARS Coronavirus 2: NEGATIVE

## 2019-05-16 LAB — CREATININE, SERUM
Creatinine, Ser: 1.66 mg/dL — ABNORMAL HIGH (ref 0.61–1.24)
GFR calc Af Amer: 53 mL/min — ABNORMAL LOW (ref >60)
GFR calc non Af Amer: 45 mL/min — ABNORMAL LOW (ref >60)

## 2019-05-16 LAB — HIV ANTIBODY (ROUTINE TESTING W REFLEX): HIV Screen 4th Generation wRfx: NONREACTIVE

## 2019-05-16 LAB — ETHANOL: Alcohol, Ethyl (B): <10 mg/dL (ref <10)

## 2019-05-16 LAB — LACTIC ACID, PLASMA
Lactic Acid, Venous: 1.2 mmol/L (ref 0.5–1.9)
Lactic Acid, Venous: 1.4 mmol/L (ref 0.5–1.9)

## 2019-05-16 LAB — MAGNESIUM: Magnesium: 2.1 mg/dL (ref 1.7–2.4)

## 2019-05-16 LAB — POTASSIUM: Potassium: 2.8 mmol/L — ABNORMAL LOW (ref 3.5–5.1)

## 2019-05-16 LAB — SALICYLATE LEVEL: Salicylate Lvl: <7 mg/dL (ref 2.8–30.0)

## 2019-05-16 LAB — CBG MONITORING, ED: Glucose-Capillary: 123 mg/dL — ABNORMAL HIGH (ref 70–99)

## 2019-05-16 MED ORDER — HEPARIN SODIUM (PORCINE) 5000 UNIT/ML IJ SOLN
5000.0000 [IU] | Freq: Three times a day (TID) | INTRAMUSCULAR | Status: DC
  Administered 2019-05-16 – 2019-05-18 (×6): 5000 [IU] via SUBCUTANEOUS
  Filled 2019-05-16 (×6): qty 1

## 2019-05-16 MED ORDER — POTASSIUM CHLORIDE 10 MEQ/100ML IV SOLN: 10.0000 meq | INTRAVENOUS | Status: DC

## 2019-05-16 MED ORDER — SODIUM CHLORIDE 0.9 % IV SOLN
INTRAVENOUS | Status: AC
  Administered 2019-05-16: 15:00:00 via INTRAVENOUS

## 2019-05-16 MED ORDER — MELATONIN 3 MG PO TABS
3.0000 mg | ORAL_TABLET | Freq: Once | ORAL | Status: AC
  Administered 2019-05-17: 3 mg via ORAL
  Filled 2019-05-16: qty 1

## 2019-05-16 MED ORDER — PERFLUTREN LIPID MICROSPHERE
1.0000 mL | INTRAVENOUS | Status: AC | PRN
  Administered 2019-05-16: 16:00:00 5 mL via INTRAVENOUS
  Filled 2019-05-16: qty 10

## 2019-05-16 MED ORDER — POTASSIUM CHLORIDE CRYS ER 20 MEQ PO TBCR: 40.0000 meq | EXTENDED_RELEASE_TABLET | Freq: Once | ORAL | Status: DC

## 2019-05-16 MED ORDER — SPIRONOLACTONE 12.5 MG HALF TABLET
12.5000 mg | ORAL_TABLET | Freq: Every day | ORAL | Status: DC
  Administered 2019-05-16: 12.5 mg via ORAL
  Filled 2019-05-16 (×2): qty 1

## 2019-05-16 MED ORDER — POTASSIUM CHLORIDE 10 MEQ/100ML IV SOLN
10.0000 meq | INTRAVENOUS | Status: AC
  Administered 2019-05-16 (×4): 10 meq via INTRAVENOUS
  Filled 2019-05-16 (×4): qty 100

## 2019-05-16 MED ORDER — NON FORMULARY: 2.5000 mg | Freq: Once | Status: DC

## 2019-05-16 MED ORDER — SODIUM CHLORIDE 0.9% FLUSH
3.0000 mL | Freq: Once | INTRAVENOUS | Status: AC
  Administered 2019-05-16: 3 mL via INTRAVENOUS

## 2019-05-16 MED ORDER — POTASSIUM CHLORIDE 10 MEQ/100ML IV SOLN
10.0000 meq | INTRAVENOUS | Status: DC
  Administered 2019-05-16: 10 meq via INTRAVENOUS
  Filled 2019-05-16: qty 100

## 2019-05-16 MED ORDER — POTASSIUM CHLORIDE 10 MEQ/100ML IV SOLN
10.0000 meq | INTRAVENOUS | Status: AC
  Administered 2019-05-16 (×2): 10 meq via INTRAVENOUS
  Filled 2019-05-16 (×2): qty 100

## 2019-05-16 MED ORDER — CARVEDILOL 3.125 MG PO TABS
3.1250 mg | ORAL_TABLET | Freq: Two times a day (BID) | ORAL | Status: DC
  Administered 2019-05-17 – 2019-05-18 (×2): 3.125 mg via ORAL
  Filled 2019-05-16 (×4): qty 1

## 2019-05-16 MED ORDER — POTASSIUM CHLORIDE CRYS ER 20 MEQ PO TBCR
40.0000 meq | EXTENDED_RELEASE_TABLET | Freq: Once | ORAL | Status: AC
  Administered 2019-05-16: 40 meq via ORAL
  Filled 2019-05-16: qty 2

## 2019-05-16 NOTE — H&P (Addendum)
Advanced Heart Failure Admission History and Physical:   Patient ID: Robb Sibal MRN: [id number]; DOB: 1961-07-24   Admission date: 05/16/2019  Primary Care Provider: Marda Stalker, PA-C Primary Cardiologist: Dr. Aundra Dubin  Primary Electrophysiologist: Dr. Hall Busing   Chief Complaint:  Syncope and ICD shocks   Patient Profile:   Justin Bernard is a 58 y.o. 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   History of Present Illness:   Mr. Knaggs is a 58 y/o male w/ h/o NICM, chronic systolic HF s/p ICD, presenting to the ED for syncope and ICD shocks.   He has been followed by Dr. Aundra Dubin. His CHF dates back to 2011 and diagnosed in [place]. He was seen at [facility]. Of note, mother had CHF diagnosis around 31 and grandfather had CHF diagnosis around 2. Pt's disease thought to be NICM. He had Cardiolite 08/13/10 [facility], [place]: areas or reversibility in anterior wall and lesser extent inferior wall. Dilated LV with EF 15% and severe hypokinesis. LHC 08/31/10 [facility], [place]: Right dominant. Left main free of disease, branches into LAD and cicrumflex. Large caliber vessels with no significant stenosis. No intimal irregularities in RCA. Elevated LVEDP at 40. 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  Initially, LVAD and transplant were considered, but EF improved greatly on medical management. EF previously as low as 15% but at one point had improved to 40-45%. His last echo done at [facility] 09/2018 and EF had dropped again to 25-30%. He is s/p Chemical engineer ICD. This is followed by Dr. Lovena Le. Other medical problems include T2DM and h/o orthostatic hypotension. 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   He was last seen in [facility] in February. Echo was obtained and showed drop in EF to 25-30%. Lisinopril was discontinued and Pt instructed to start Entresto after 36 hr washout period. He was continued on Coreg 6.25 mg bid and spironolactone 25 mg + Lasix. Pt has not followed up since that visit due to issues w/ insurance. Apparently was not able to afford Entresto in Feb and was switched back to lisinopril.   05/09/19, pt contacted the [facility] requesting assistance w/ medications. He had been out of lisinopril x 2 weeks. Decision was made to restart Entresto and pt was given samples from the office and medication assistance application form. Coreg was also resumed. Pt reports he was not sure how to take Delene Loll so was only taking once a day. Has had 2 doses. 1 dose on 9/29 and another dose on 9/30.   Night of 9/30, pt had syncopal spell at home and called on call provider. Apparently there was no prodrome. No associated CP or dyspnea. When he regained consciousness he was on the floor and did not recall feeling any ICD shocks. He was instructed to submit a remote transmission to device team. Interrogation showed 2 episodes of VF w/ appropriate shocks x 2. Pt advised to come to the ED.  In the ED he is A&Ox 3. Sister at bedside. VSS. Denies CP, dyspnea and no recent palpitations. No recurrent syncope/ near syncope. Weight has been stable at home ~254 lb c/w dry weight. No  recent increase in Lasix but has taken Entresto as above. K low at 2.7. Mg normal at 2.1. Denies any recent n/v/d. Has been taking supplemental K at home as directed. EKG shows SR 99 bpm w/ PVCs and lots of artifact. Computer interprets prolonged QT/QTc 401/515 ms. Tele currently shows NSR with occasional PVCs.    Heart Pathway Score:     Past Medical History:  Diagnosis Date   AICD (automatic  cardioverter/defibrillator) present    Arthritis    L hip    Asthma    pt. uses albuterol inhaler in the spring for enviromental allergies    Cataract    LEFT EYE   CHF (congestive heart failure) (HCC)    Chronic systolic heart failure (HCC)    NICM, previous heart transplant candidate with EF 10%   Diabetes mellitus without complication ([place])    TYPE 2   Family history of adverse reaction to anesthesia    sister- headache & N&V am and mother   History of hiatal hernia    History of kidney stones    Hypertension    Neuromuscular disorder ([place])    essential tremor   Shortness of breath dyspnea    WITH EXERTION    Past Surgical History:  Procedure Laterality Date   BACK SURGERY  07/2000   fusion , [place]  LOWER   BONE EXCISION Right 04/29/2016   Procedure: EXCISION HETEROTOPIC BONE RIGHT HUMERUS;  Surgeon: Altamese Mosby, MD;  Location: [facility];  Service: Orthopedics;  Laterality: Right;   CARDIAC CATHETERIZATION  08/2012   CYSTOSCOPY/URETEROSCOPY/HOLMIUM LASER/STENT PLACEMENT Bilateral 04/03/2017   Procedure: CYSTOSCOPY/URETEROSCOPY/HOLMIUM LASER/LEFT STENT REPLACEMENT AND RIGHT   STENT PLACEMENT;  Surgeon: Nickie Retort, MD;  Location: [facility];  Service: Urology;  Laterality: Bilateral;   FRACTURE SURGERY Left    hip , r shoulder    HARDWARE REMOVAL Right 04/29/2016   Procedure: HARDWARE REMOVAL;  Surgeon: Altamese Penuelas, MD;  Location: [place];  Service: Orthopedics;  Laterality: Right;   HERNIA REPAIR Right    inguinal    HIP SURGERY Left YRS AGO   hardware present, for slipped joint    HOLMIUM LASER APPLICATION Bilateral 6/80/3212   Procedure: HOLMIUM LASER APPLICATION;  Surgeon: Nickie Retort, MD;  Location: [facility];  Service: Urology;  Laterality: Bilateral;   ICD  2012   [employer]   IR URETERAL STENT LEFT NEW ACCESS W/O SEP NEPHROSTOMY CATH  02/20/2017   NEPHROLITHOTOMY Left 02/20/2017   Procedure: NEPHROLITHOTOMY PERCUTANEOUS;   Surgeon: Nickie Retort, MD;  Location[facility];  Service: Urology;  Laterality: Left;   ORIF HUMERUS FRACTURE Right 07/20/2015   Procedure: OPEN REDUCTION INTERNAL FIXATION (ORIF) PROXIMAL HUMERUS FRACTURE;  Surgeon: Netta Cedars, MD;  Location: [place];  Service: Orthopedics;  Laterality: Right;   TONSILLECTOMY  AS CHILD   ulnar surgery Right      Medications Prior to Admission: Prior to Admission medications   Medication Sig Start Date End Date Taking? Authorizing Provider  albuterol (PROVENTIL HFA;VENTOLIN HFA) 108 (90 Base) MCG/ACT inhaler Inhale 2 puffs into the lungs every 6 (six) hours as needed for wheezing or shortness of breath.    [provider]  carvedilol (COREG) 6.25 MG tablet Take 1 tablet (6.25 mg total) by mouth 2 (two) times daily with a meal. 05/13/19   Larey Dresser, MD  cephALEXin (KEFLEX) 500 MG capsule Take 1 capsule (500 mg total) by mouth 3 (three) times daily. 11/27/18   Michel Bickers,  MD  furosemide (LASIX) 40 MG tablet Take 1 tablet (40 mg total) by mouth daily. 05/13/19   Larey Dresser, MD  glipiZIDE (GLUCOTROL XL) 5 MG 24 hr tablet Take 5 mg by mouth daily with breakfast.  04/24/16   [provider]  glucose monitoring kit (FREESTYLE) monitoring kit 1 each by Does not apply route 4 (four) times daily - after meals and at bedtime. 1 month Diabetic Testing Supplies for QAC-QHS accuchecks. 07/23/15   Ghimire, Henreitta Leber, MD  hydrocortisone cream 1 % Apply 1 application topically 2 (two) times daily as needed for itching.    [provider]  ibuprofen (ADVIL,MOTRIN) 200 MG tablet Take 400-600 mg by mouth every 8 (eight) hours as needed (for pain/headaches.).    [provider]  Multiple Vitamin (MULTIVITAMIN WITH MINERALS) TABS tablet Take 1 tablet by mouth daily. 08/22/16   Elgergawy, Silver Huguenin, MD  potassium chloride SA (K-DUR,KLOR-CON) 20 MEQ tablet Take 2 tablets (40 mEq total) by mouth daily. 10/09/18   Clegg, Amy D, NP    sacubitril-valsartan (ENTRESTO) 24-26 MG Take 1 tablet by mouth 2 (two) times daily. 10/10/18   Darrick Grinder D, NP     Allergies:    Allergies  Allergen Reactions   Penicillins Other (See Comments)    Has patient had a PCN reaction causing immediate rash, facial/tongue/throat swelling, SOB or lightheadedness with hypotension: Yes Has patient had a PCN reaction causing severe rash involving mucus membranes or skin necrosis: Yes Has patient had a PCN reaction that required hospitalization:  No Has patient had a PCN reaction occurring within the last 10 years: No If all of the above answers are "NO", then may proceed with Cephalosporin use..   childhood reaction, swelling & had to be given an anti    Social History:   Social History   Socioeconomic History   Marital status: Divorced    Spouse name: Not on file   Number of children: Not on file   Years of education: Not on file   Highest education level: Not on file  Occupational History   Not on file  Social Needs   Financial resource strain: Not on file   Food insecurity    Worry: Not on file    Inability: Not on file   Transportation needs    Medical: Not on file    Non-medical: Not on file  Tobacco Use   Smoking status: Never Smoker   Smokeless tobacco: Never Used  Substance and Sexual Activity   Alcohol use: No   Drug use: No   Sexual activity: Yes  Lifestyle   Physical activity    Days per week: Not on file    Minutes per session: Not on file   Stress: Not on file  Relationships   Social connections    Talks on phone: Not on file    Gets together: Not on file    Attends religious service: Not on file    Active member of club or organization: Not on file    Attends meetings of clubs or organizations: Not on file    Relationship status: Not on file   Intimate partner violence    Fear of current or ex partner: Not on file    Emotionally abused: Not on file    Physically abused: Not on file     Forced sexual activity: Not on file  Other Topics Concern   Not on file  Social History Narrative   Not on  file    Family History:   The patient's family history includes CAD in his father and another family member; Heart failure in his mother; Hypertension in his mother and another family member.    ROS:  Please see the history of present illness.  All other ROS reviewed and negative.     Physical Exam/Data:   Vitals:   05/16/19 1009  Pulse: 91  Resp: 20  Temp: 98.1 F (36.7 C)  TempSrc: Oral  SpO2: 92%   No intake or output data in the 24 hours ending 05/16/19 1142 Last 3 Weights 10/09/2018 09/24/2018 08/06/2018  Weight (lbs) 267 lb 277 lb 9.6 oz 273 lb  Weight (kg) 121.11 kg 125.919 kg 123.832 kg     There is no height or weight on file to calculate BMI.  General:  Well nourished, well developed, in no acute distress but a bit anxious HEENT: normal Lymph: no adenopathy Neck: no JVD Endocrine:  No thryomegaly Vascular: No carotid bruits; FA pulses 2+ bilaterally without bruits  Cardiac:  normal S1, S2; RRR; no murmur  Lungs:  clear to auscultation bilaterally, no wheezing, rhonchi or rales  Abd: soft, nontender, no hepatomegaly  Ext: no edema, varicose veins bilaterally  Musculoskeletal:  No deformities, BUE and BLE strength normal and equal Skin: warm and dry  Neuro:  CNs 2-12 intact, no focal abnormalities noted Psych:  Normal affect    EKG:  The ECG that was 05/16/19 done was personally reviewed and demonstrates NSr 99 bpms w/ PVCs and artifact  Relevant CV Studies: 2D Echo 09/2018   1. The left ventricle has severely reduced systolic function, with an ejection fraction of 25-30%. The cavity size was mildly dilated. There is mildly increased left ventricular wall thickness. Left ventricular diastolic Doppler parameters are  consistent with impaired relaxation Left ventricular diffuse hypokinesis.  2. The right ventricle has normal systolic function. The cavity  was normal. There is no increase in right ventricular wall thickness.  3. Left atrial size was mildly dilated.  4. Right atrial size was mildly dilated.  5. The mitral valve is normal in structure. No evidence of mitral valve stenosis. Mild regurgitation.  6. The tricuspid valve is normal in structure.  7. The aortic valve is tricuspid There is mild calcification of the aortic valve. No stenosis.  8. The pulmonic valve was normal in structure.  9. The aortic root and ascending aorta are normal in size and structure. 10. Right atrial pressure is estimated at 3 mmHg. 11. PA systolic pressure 26 mmHg.  Laboratory Data:  High Sensitivity Troponin:   Recent Labs  Lab 05/16/19 1031  TROPONINIHS 55*      Chemistry Recent Labs  Lab 05/16/19 1031  NA 135  K 2.7*  CL 91*  CO2 25  GLUCOSE 118*  BUN 27*  CREATININE 1.95*  CALCIUM 8.9  GFRNONAA 37*  GFRAA 43*  ANIONGAP 19*    No results for input(s): PROT, ALBUMIN, AST, ALT, ALKPHOS, BILITOT in the last 168 hours. Hematology Recent Labs  Lab 05/16/19 1031  WBC 10.2  RBC 5.26  HGB 13.9  HCT 45.0  MCV 85.6  MCH 26.4  MCHC 30.9  RDW 23.4*  PLT 301   BNPNo results for input(s): BNP, PROBNP in the last 168 hours.  DDimer No results for input(s): DDIMER in the last 168 hours.   Radiology/Studies:  Dg Chest Port 1 View  Result Date: 05/16/2019 CLINICAL DATA:  Fatigue EXAM: PORTABLE CHEST 1 VIEW COMPARISON:  08/13/2016 FINDINGS: Cardiac shadow is mildly prominent slightly enlarged from the prior exam but accentuated by the portable technique. Defibrillator is again noted and stable. Minimal basilar atelectasis is seen. No effusion or pneumothorax is noted. No focal infiltrate is seen. IMPRESSION: Minimal bibasilar atelectasis.  No other focal abnormality is noted. Electronically Signed   By: Inez Catalina M.D.   On: 05/16/2019 10:50    Assessment and Plan:   Tylik Treese is a 58 y.o. 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   1. Syncope: remote device interrogation revealed VT/VF w/ defibrillation x 2 on 05/15/19. -EP consult pending -Full device interrogation pending -In the setting of hypokalemia. K 2.7. Supplementation ordered. See below -Keep K >4.0 and Mg >2.0 -Monitor closely on tele -May need IV amiodarone if recurrent ventricular arrhythmias -Continue Coreg  -Avoid QT prolonging agents -No recent CP to suggest ischemic etiology. Hs troponin 55, but likely 2/2 VF and ICD shocks. -Will continue to cycle trops x 3 to assess trend  -Repeat 2D echo  2. Hypokalemia: K 2.7. Mg 2.1 -suspect hypokalemia contributed to VF -EKG w/ prolonged Qt/QTc  401/515 ms -Supplemental K ordered 10 mEq IV qh x 3 + 40 mEq PO -F/u BMP later today to ensure adequate supplementation -suspect hypokalemia caused by over diuresis after recent addition of Entresto. Volume is stable. Can likely scale back on lasix dose and or frequency. May also consider discontinuing lasix all together and add spironolactone once AKI resolves.  -avoid Qt prolonging agents -monitor QTc  3. Chronic Systolic HF/NICM: may be familial given + FH in multiple relatives. EF as low as 15 % previously but improved w/ medications, as high as 40-45%. LHC in 2011 in [place] showed normal coronaries. Last echo 09/2018 showed further decline in EF down to 25-30% w/ diffuse hypokinesis.  -Plan to ContinueCoreg -Will hold Entresto due to AKI -Given soft BP and hypokalemia, would consider complete discontinuation of Lasix -Consider addition of spironolactone if AKI resolves -Has ICD -Euvolemic on exam. NYHA Functional Class II symptoms - Home dry weight 254 lb. Has been stable per pt report  4. MSSA osteomyelitis of the right shoulder:  was initially scheduled to undergo debridement in Feb but this  unfortunately was postponed due to [place] pandemic and has not yet been rescheduled. Has been followed by ortho and ID. Not currently on antibiotics - Has ICD and would be concerned for device infection but no current signs of systemic infection. Afebrile. WBC ct WNL.  - ? Inpatient evaluation vs continuation of outpatient f/u. Will defer to Dr. Aundra Dubin  5. AKI: SCr 1.95 on admit. Baseline SCr ~0.9-1.1. BUN 27 - Suspect over diuresis after addition of Entresto + standing PO Lasix dose - Denies any other volume loss. No vomiting or diarrhrea - Will hold both Entresto and Lasix for now as volume appears stable - May need gentle diuresis - F/u BMP in the AM - If renal function improves, would favor continuation of low dose Entresto and discontinuation of daily Lasix.    For questions or updates, please contact [place] Please consult [email or web address] for contact info under     Signed, Lyda Jester, PA-C  05/16/2019 11:42 AM   Patient seen with PA, agree with the above note.   Patient was admitted with 2 shocks for VF last night, also had a shock in  August.  He came to the ER and was found to be markedly hypokalemic, 2.7.  Hs-TnI mildly elevated at 55.    He says that he has been doing well recently, no exertional dyspnea.  No chest pain.  No orthopnea.  He had started on Entresto earlier this week, but was only taking 1 tablet a day.  He has also been taking Lasix 40 mg daily and Coreg.   General: NAD Neck: No JVD, no thyromegaly or thyroid nodule.  Lungs: Clear to auscultation bilaterally with normal respiratory effort. CV: Nondisplaced PMI.  Heart regular S1/S2, no S3/S4, no murmur.  No peripheral edema.  No carotid bruit.  Normal pedal pulses.  Abdomen: Soft, nontender, no hepatosplenomegaly, no distention.  Skin: Intact without lesions or rashes.  Neurologic: Alert and oriented x 3.  Psych: Normal affect. Extremities: No clubbing or cyanosis.  HEENT: Normal.   I suspect  that the VF was related to underlying nonischemic cardiomyopathy + marked hypokalemia.   - Will aggressively replace K, repeat BMET this evening.  - He is not volume overloaded so I will stop Lasix for now.   - Think we can hold off on anti-arrhythmic for now with marked hypokalemia.  - Continue Coreg but use lower dose 3.125 mg bid with low BP.  - Spironolactone 12.5 daily.   Last echo in 2/20 with EF lower at 25-30%.  On exam, he is not volume overloaded.  Creatinine up to 1.95 from previous normal range.  Possible over-diuresis with Lasix + Entresto, also possible hypotension with Entresto use.   - Stop Lasix for now, gentle hydration.  - Stop Entresto, BP may be too low to continue it going forwards.  - Follow BP and creatinine to determine med dosing going forwards.   - Repeat echo.  - I am concerned by his trajectory.  VT, not tolerating meds well with low BP, increased creatinine.  Concerned that cardiomyopathy is worsening.  He will need CPX as outpatient, consideration for RHC depending on clincal course.   Chronic MSSA osteomyelitis right shoulder => debridement has been put off.  This will need to be eventually dealt with. He is afebrile with normal WBCs, so doubt sepsis.   Loralie Champagne 05/16/2019 2:52 PM

## 2019-05-16 NOTE — Telephone Encounter (Signed)
Spoke with SW Jenna this morning. Pt endorsed to her he was feeling lightheaded after starting Entresto.  I called patient to assess his symptoms and he told me that he spoke with PA Tillery in device clinic before I called and he was instructed to go to ED for evaluation (see note from [place]).

## 2019-05-16 NOTE — ED Provider Notes (Signed)
[facility] EMERGENCY DEPARTMENT Provider Note   CSN: [id number] Arrival date & time: 05/16/19  8657     History   Chief Complaint Chief Complaint  Patient presents with  . AICD Problem  . Loss of Consciousness    HPI Justin Bernard is a 58 y.o. male.     58 yo M with a chief complaints of having his AICD fire x3 over the past couple days.  Patient states that he has felt very fatigued over the past few days.  Had one episode where he actually passed out.  Denies any chest pain or pressure.  Denies nausea vomiting or diarrhea.  States he has been eating and drinking normally.  No fevers no cough.  No known COVID exposure.    The history is provided by the patient.  Loss of Consciousness Episode history:  Single Most recent episode:  2 days ago Timing:  Rare Progression:  Resolved Chronicity:  New Relieved by:  Nothing Worsened by:  Nothing Ineffective treatments:  None tried Associated symptoms: weakness   Associated symptoms: no chest pain, no confusion, no fever, no headaches, no palpitations, no shortness of breath and no vomiting   Illness Severity:  Moderate Onset quality:  Gradual Duration:  3 days Timing:  Constant Progression:  Unchanged Chronicity:  Recurrent Associated symptoms: no abdominal pain, no chest pain, no congestion, no diarrhea, no fever, no headaches, no myalgias, no rash, no shortness of breath and no vomiting     Past Medical History:  Diagnosis Date  . AICD (automatic cardioverter/defibrillator) present   . Arthritis    L hip   . Asthma    pt. uses albuterol inhaler in the spring for enviromental allergies   . Cataract    LEFT EYE  . CHF (congestive heart failure) ([place])   . Chronic systolic heart failure (HCC)    NICM, previous heart transplant candidate with EF 10%  . Diabetes mellitus without complication ([place])    TYPE 2  . Family history of adverse reaction to anesthesia    sister- headache & N&V am and  mother  . History of hiatal hernia   . History of kidney stones   . Hypertension   . Neuromuscular disorder ([place])    essential tremor  . Shortness of breath dyspnea    WITH EXERTION    Patient Active Problem List   Diagnosis Date Noted  . Infection of shoulder ([place]) 05/29/2018  . Nephrolithiasis 02/20/2017  . ETOH abuse 09/22/2016  . Orthostatic hypotension 09/10/2016  . Urinary retention 09/10/2016  . Chronic venous stasis dermatitis 09/08/2016  . Diabetes mellitus type II, non insulin dependent ([place]) 08/13/2016  . Painful orthopaedic hardware ([place]) 04/29/2016  . S/P hardware removal 04/29/2016  . ICD (implantable cardioverter-defibrillator) in place 08/04/2015  . Chronic systolic CHF (congestive heart failure), NYHA class 2 ([place]) 07/17/2015  . Fracture closed, humerus 07/17/2015    Past Surgical History:  Procedure Laterality Date  . BACK SURGERY  07/2000   fusion , [facility]  . BONE EXCISION Right 04/29/2016   Procedure: EXCISION HETEROTOPIC BONE RIGHT HUMERUS;  Surgeon: Altamese Parc, MD;  Location: [facility];  Service: Orthopedics;  Laterality: Right;  . CARDIAC CATHETERIZATION  08/2012  . CYSTOSCOPY/URETEROSCOPY/HOLMIUM LASER/STENT PLACEMENT Bilateral 04/03/2017   Procedure: CYSTOSCOPY/URETEROSCOPY/HOLMIUM LASER/LEFT STENT REPLACEMENT AND RIGHT   STENT PLACEMENT;  Surgeon: Nickie Retort, MD;  Location: [facility];  Service: Urology;  Laterality: Bilateral;  . FRACTURE SURGERY Left  hip , r shoulder   . HARDWARE REMOVAL Right 04/29/2016   Procedure: HARDWARE REMOVAL;  Surgeon: Altamese Kiel, MD;  Location: [facility];  Service: Orthopedics;  Laterality: Right;  . HERNIA REPAIR Right    inguinal   . HIP SURGERY Left YRS AGO   hardware present, for slipped joint   . HOLMIUM LASER APPLICATION Bilateral 11/03/2246   Procedure: HOLMIUM LASER APPLICATION;  Surgeon: Nickie Retort, MD;  Location: [facility];  Service: Urology;  Laterality: Bilateral;  . ICD  2012    [employer]  . IR URETERAL STENT LEFT NEW ACCESS W/O SEP NEPHROSTOMY CATH  02/20/2017  . NEPHROLITHOTOMY Left 02/20/2017   Procedure: NEPHROLITHOTOMY PERCUTANEOUS;  Surgeon: Nickie Retort, MD;  Location: [facility];  Service: Urology;  Laterality: Left;  . ORIF HUMERUS FRACTURE Right 07/20/2015   Procedure: OPEN REDUCTION INTERNAL FIXATION (ORIF) PROXIMAL HUMERUS FRACTURE;  Surgeon: Netta Cedars, MD;  Location: [facility];  Service: Orthopedics;  Laterality: Right;  . TONSILLECTOMY  AS CHILD  . ulnar surgery Right         Home Medications    Prior to Admission medications   Medication Sig Start Date End Date Taking? Authorizing Provider  albuterol (PROVENTIL HFA;VENTOLIN HFA) 108 (90 Base) MCG/ACT inhaler Inhale 2 puffs into the lungs every 6 (six) hours as needed for wheezing or shortness of breath.    [provider]  carvedilol (COREG) 6.25 MG tablet Take 1 tablet (6.25 mg total) by mouth 2 (two) times daily with a meal. 05/13/19   Larey Dresser, MD  cephALEXin (KEFLEX) 500 MG capsule Take 1 capsule (500 mg total) by mouth 3 (three) times daily. 11/27/18   Michel Bickers, MD  furosemide (LASIX) 40 MG tablet Take 1 tablet (40 mg total) by mouth daily. 05/13/19   Larey Dresser, MD  glipiZIDE (GLUCOTROL XL) 5 MG 24 hr tablet Take 5 mg by mouth daily with breakfast.  04/24/16   [provider]  glucose monitoring kit (FREESTYLE) monitoring kit 1 each by Does not apply route 4 (four) times daily - after meals and at bedtime. 1 month Diabetic Testing Supplies for QAC-QHS accuchecks. 07/23/15   Ghimire, Henreitta Leber, MD  hydrocortisone cream 1 % Apply 1 application topically 2 (two) times daily as needed for itching.    [provider]  ibuprofen (ADVIL,MOTRIN) 200 MG tablet Take 400-600 mg by mouth every 8 (eight) hours as needed (for pain/headaches.).    [provider]  Multiple Vitamin (MULTIVITAMIN WITH MINERALS) TABS tablet Take 1 tablet by mouth daily.  08/22/16   Elgergawy, Silver Huguenin, MD  potassium chloride SA (K-DUR,KLOR-CON) 20 MEQ tablet Take 2 tablets (40 mEq total) by mouth daily. 10/09/18   Clegg, Amy D, NP  sacubitril-valsartan (ENTRESTO) 24-26 MG Take 1 tablet by mouth 2 (two) times daily. 10/10/18   Conrad Lancaster, NP    Family History Family History  Problem Relation Age of Onset  . Heart failure Mother   . Hypertension Mother   . CAD Father   . Hypertension Other   . CAD Other     Social History Social History   Tobacco Use  . Smoking status: Never Smoker  . Smokeless tobacco: Never Used  Substance Use Topics  . Alcohol use: No  . Drug use: No     Allergies   Penicillins   Review of Systems Review of Systems  Constitutional: Negative for chills and fever.  HENT: Negative for congestion and facial swelling.  Eyes: Negative for discharge and visual disturbance.  Respiratory: Negative for shortness of breath.   Cardiovascular: Positive for syncope. Negative for chest pain and palpitations.  Gastrointestinal: Negative for abdominal pain, diarrhea and vomiting.  Musculoskeletal: Negative for arthralgias and myalgias.  Skin: Negative for color change and rash.  Neurological: Positive for weakness. Negative for tremors, syncope and headaches.  Psychiatric/Behavioral: Negative for confusion and dysphoric mood.     Physical Exam Updated Vital Signs BP (!) 108/42   Pulse 99   Temp 98.1 F (36.7 C) (Oral)   Resp 17   SpO2 97%   Physical Exam Vitals signs and nursing note reviewed.  Constitutional:      Appearance: He is well-developed.  HENT:     Head: Normocephalic and atraumatic.  Eyes:     Pupils: Pupils are equal, round, and reactive to light.  Neck:     Musculoskeletal: Normal range of motion and neck supple.     Vascular: No JVD.  Cardiovascular:     Rate and Rhythm: Normal rate and regular rhythm.     Heart sounds: No murmur. No friction rub. No gallop.   Pulmonary:     Effort: No respiratory  distress.     Breath sounds: No wheezing.  Abdominal:     General: There is no distension.     Tenderness: There is no abdominal tenderness. There is no guarding or rebound.  Musculoskeletal: Normal range of motion.  Skin:    Coloration: Skin is not pale.     Findings: No rash.  Neurological:     Mental Status: He is alert and oriented to person, place, and time.  Psychiatric:        Behavior: Behavior normal.      ED Treatments / Results  Labs (all labs ordered are listed, but only abnormal results are displayed) Labs Reviewed  BASIC METABOLIC PANEL - Abnormal; Notable for the following components:      Result Value   Potassium 2.7 (*)    Chloride 91 (*)    Glucose, Bld 118 (*)    BUN 27 (*)    Creatinine, Ser 1.95 (*)    GFR calc non Af Amer 37 (*)    GFR calc Af Amer 43 (*)    Anion gap 19 (*)    All other components within normal limits  CBC - Abnormal; Notable for the following components:   RDW 23.4 (*)    All other components within normal limits  CBG MONITORING, ED - Abnormal; Notable for the following components:   Glucose-Capillary 123 (*)    All other components within normal limits  TROPONIN I (HIGH SENSITIVITY) - Abnormal; Notable for the following components:   Troponin I (High Sensitivity) 55 (*)    All other components within normal limits  SARS CORONAVIRUS 2 (HOSPITAL ORDER, PERFORMED IN [facility])  MAGNESIUM  URINALYSIS, ROUTINE W REFLEX MICROSCOPIC  ETHANOL  SALICYLATE LEVEL  LACTIC ACID, PLASMA  LACTIC ACID, PLASMA  POTASSIUM    EKG EKG Interpretation  Date/Time:  Thursday May 16 2019 10:06:40 EDT Ventricular Rate:  99 PR Interval:    QRS Duration: 111 QT Interval:  401 QTC Calculation: 515 R Axis:   9 Text Interpretation:  Sinus tachycardia Ventricular premature complex Probable left atrial enlargement LVH with IVCD and secondary repol abnrm Abnormal inferior Q waves Prolonged QT interval Artifact in lead(s) I II III  aVR aVL aVF No significant change since last tracing Confirmed by Shadd Dunstan (  [id number]) on 05/16/2019 10:13:44 AM   Radiology Dg Chest Port 1 View  Result Date: 05/16/2019 CLINICAL DATA:  Fatigue EXAM: PORTABLE CHEST 1 VIEW COMPARISON:  08/13/2016 FINDINGS: Cardiac shadow is mildly prominent slightly enlarged from the prior exam but accentuated by the portable technique. Defibrillator is again noted and stable. Minimal basilar atelectasis is seen. No effusion or pneumothorax is noted. No focal infiltrate is seen. IMPRESSION: Minimal bibasilar atelectasis.  No other focal abnormality is noted. Electronically Signed   By: Inez Catalina M.D.   On: 05/16/2019 10:50    Procedures Procedures (including critical care time)  Medications Ordered in ED Medications  potassium chloride 10 mEq in 100 mL IVPB (has no administration in time range)  sodium chloride flush (NS) 0.9 % injection 3 mL (3 mLs Intravenous Given 05/16/19 1033)  potassium chloride SA (KLOR-CON) CR tablet 40 mEq (40 mEq Oral Given 05/16/19 1225)     Initial Impression / Assessment and Plan / ED Course  I have reviewed the triage vital signs and the nursing notes.  Pertinent labs & imaging results that were available during my care of the patient were reviewed by me and considered in my medical decision making (see chart for details).        58 yo M with a chief complaints of having his AICD fire x3 over the past couple days.  Patient recently had a medication change to [facility].  Has continued to take his potassium supplements.  Denies infectious symptoms.  Complaining of generalized malaise.  We will obtain electrolytes.  Troponin.  Chest x-ray.  Discussed with cards.  Cards evaluated the patient.  With his hypokalemia he will likely be admitted.  CRITICAL CARE Performed by: Cecilio Asper   Total critical care time: 35 minutes  Critical care time was exclusive of separately billable procedures and treating other  patients.  Critical care was necessary to treat or prevent imminent or life-threatening deterioration.  Critical care was time spent personally by me on the following activities: development of treatment plan with patient and/or surrogate as well as nursing, discussions with consultants, evaluation of patient's response to treatment, examination of patient, obtaining history from patient or surrogate, ordering and performing treatments and interventions, ordering and review of laboratory studies, ordering and review of radiographic studies, pulse oximetry and re-evaluation of patient's condition.  The patients results and plan were reviewed and discussed.   Any x-rays performed were independently reviewed by myself.   Differential diagnosis were considered with the presenting HPI.  Medications  potassium chloride 10 mEq in 100 mL IVPB (has no administration in time range)  sodium chloride flush (NS) 0.9 % injection 3 mL (3 mLs Intravenous Given 05/16/19 1033)  potassium chloride SA (KLOR-CON) CR tablet 40 mEq (40 mEq Oral Given 05/16/19 1225)    Vitals:   05/16/19 1009 05/16/19 1215 05/16/19 1230  BP:  106/75 (!) 108/42  Pulse: 91 92 99  Resp: _0 Temp: 98.1 F (36.7 C)    TempSrc: Oral    SpO2: 92% 93% 97%    Final diagnoses:  Ventricular fibrillation (HCC)  Hypokalemia    Admission/ observation were discussed with the admitting physician, patient and/or family and they are comfortable with the plan.   Final Clinical Impressions(s) / ED Diagnoses   Final diagnoses:  Ventricular fibrillation [facility])  Hypokalemia    ED Discharge Orders    None       Deno Etienne, DO 05/16/19 1256

## 2019-05-16 NOTE — Progress Notes (Signed)
  Echocardiogram 2D Echocardiogram has been performed.  Justin Bernard 05/16/2019, 4:48 PM

## 2019-05-16 NOTE — Telephone Encounter (Signed)
See phone note from 10/1. Pt instructed to present to [facility] for appropriate ICD therapy x 3.   Legrand Como [address] [place], PA-C  05/16/2019 9:18 AM

## 2019-05-16 NOTE — Consult Note (Addendum)
Cardiology Consultation:   Patient ID: Justin Bernard MRN: [id number]; DOB: [birth year]  Admit date: 05/16/2019 Date of Consult: 05/16/2019  Primary Care Provider: Marda Stalker, PA-C Primary Cardiologist: Dr. Aundra Dubin Primary Electrophysiologist:  Dr. Lovena Le   Patient Profile:   Justin Bernard is a 58 y.o. male with a hx of NICM, (clean cath in 2012), ? If genetic, w/ICD (implanted in [place]), initially LVAD and transplant were considered but he did very well with improvment in EF, DM, seasonal asthma, essential tremor, chronic venous stasis, reports of h/o ETOH abuse, (pt denies any ETOH in years) who is being seen today for the evaluation of ICD shocks, syncope at the request of Dr. Tyrone Bernard.  History of Present Illness:   [name] has been communicating over the phone mostly with HF clinic with difficulty in obtaining his medicines/loss of insurance.  HF clinic has been helping him with this, it seems particularly his Justin Bernard and has been taking his lisinopril intermittently in the meantime.  He has called that he has been feeling weak back on all his medicines.    He tells me that for about a week or more he has been more tired then usual, sleeping more, but no near syncope or syncope until yesterday. His sister is bedside and tells me he is sleeping 16 hrs a day, the patient reports he will usually sleep about 12hours daily including his naps.  He says he was in the kitchen yesterday, turned to do something, as he turned the next thing he knew he was on the floor with his Mom hovering over him.  He did not feel a shock, did not think he fainted but does not know why/how he ended up on the floor.  This correlates timing-wise with his 16:50 ATP/shock episode  He denies any kind of CP, palpitations or unusual SOB He denies symptoms of PND or orthopnea, says he has trouble sleeping at night but not because of difficulty breathing.  He has more DOE in the summer/hot  weather, but reports his exertional capacity as "good" and at his baseline.  The patient tells me that he has been compliant without missed doses of his coreg, lasix, and potassium.  In fact tells me he was for the lst couple days taking his Kdur 52mq 1 tab QID  He has been unable to take lisinopril for 2 weeks, and given Entresto via office recently took one pill Tuesday, one pill Wed, none today.  With the addition of the entresto he has been more tired/weak.  Also of note, he apparently back in Feb of this year was pending shoulder surgery for MSSA osteomyelitis.  This was never done, he has a bandage over a small wound he says drains periodically.  He denies pain, no fever, symptoms of illness.  He had been on antibiotics for "a long time" though stopped months ago 2/2 GI intolerance per the patient.  He says that initially his surgery was interrupted/cancelled because of COVID, has not gotten back to getting it taken care of.  LABS K+ 2.7 Mag 2.1 BUN/Creat 27/1.97 HS Trop 55 WBC 10.2 H/H 13/45 Plts 301  COVID is pending (denies any symptoms of illness, no sick contacts)  CXR: minimal bibasilar atelectasis.  No other focal abnormality is noted   Device information BSCi single chamber ICD [place]/06/[place] far as I [place] tell for primary prevention)  Interrogation (remote) Battery and lead impedence/sensing are good 05/15/2019: 01:18 ATPx1 failed > shock x1 successful,  appropriate for PMVT/VF 16:51 shock x1 successful appropriate for VF 05/14/2019 07:08 ATP x2 failed, shock x1 successful, appropriate for PMVT / VF 03/16/2019 VT in monitor zone, no therapies (x8) VT w/ATPx1 failed, shock (x1) (no EGMs for immediate review)   Heart Pathway Score:     Past Medical History:  Diagnosis Date  . AICD (automatic cardioverter/defibrillator) present   . Arthritis    L hip   . Asthma    pt. uses albuterol inhaler in the spring for enviromental allergies   .  Cataract    LEFT EYE  . CHF (congestive heart failure) ([place])   . Chronic systolic heart failure (HCC)    NICM, previous heart transplant candidate with EF 10%  . Diabetes mellitus without complication ([place])    TYPE 2  . Family history of adverse reaction to anesthesia    sister- headache & N&V am and mother  . History of hiatal hernia   . History of kidney stones   . Hypertension   . Neuromuscular disorder ([place])    essential tremor  . Shortness of breath dyspnea    WITH EXERTION    Past Surgical History:  Procedure Laterality Date  . BACK SURGERY  07/2000   fusion , [facility]  . BONE EXCISION Right 04/29/2016   Procedure: EXCISION HETEROTOPIC BONE RIGHT HUMERUS;  Surgeon: Altamese Nardin, MD;  Location: [place];  Service: Orthopedics;  Laterality: Right;  . CARDIAC CATHETERIZATION  08/2012  . CYSTOSCOPY/URETEROSCOPY/HOLMIUM LASER/STENT PLACEMENT Bilateral 04/03/2017   Procedure: CYSTOSCOPY/URETEROSCOPY/HOLMIUM LASER/LEFT STENT REPLACEMENT AND RIGHT   STENT PLACEMENT;  Surgeon: Nickie Retort, MD;  Location: [facility];  Service: Urology;  Laterality: Bilateral;  . FRACTURE SURGERY Left    hip , r shoulder   . HARDWARE REMOVAL Right 04/29/2016   Procedure: HARDWARE REMOVAL;  Surgeon: Altamese [place], MD;  Location: [place];  Service: Orthopedics;  Laterality: Right;  . HERNIA REPAIR Right    inguinal   . HIP SURGERY Left YRS AGO   hardware present, for slipped joint   . HOLMIUM LASER APPLICATION Bilateral 3/54/5625   Procedure: HOLMIUM LASER APPLICATION;  Surgeon: Nickie Retort, MD;  Location: [facility];  Service: Urology;  Laterality: Bilateral;  . ICD  2012   [employer]  . IR URETERAL STENT LEFT NEW ACCESS W/O SEP NEPHROSTOMY CATH  02/20/2017  . NEPHROLITHOTOMY Left 02/20/2017   Procedure: NEPHROLITHOTOMY PERCUTANEOUS;  Surgeon: Nickie Retort, MD;  Location: [facility];  Service: Urology;  Laterality: Left;  . ORIF HUMERUS FRACTURE Right 07/20/2015   Procedure:  OPEN REDUCTION INTERNAL FIXATION (ORIF) PROXIMAL HUMERUS FRACTURE;  Surgeon: Netta Cedars, MD;  Location: [place];  Service: Orthopedics;  Laterality: Right;  . TONSILLECTOMY  AS CHILD  . ulnar surgery Right        Inpatient Medications: Scheduled Meds:  Continuous Infusions:  PRN Meds:   Allergies:    Allergies  Allergen Reactions  . Penicillins Other (See Comments)    Has patient had a PCN reaction causing immediate rash, facial/tongue/throat swelling, SOB or lightheadedness with hypotension: Yes Has patient had a PCN reaction causing severe rash involving mucus membranes or skin necrosis: Yes Has patient had a PCN reaction that required hospitalization:  No Has patient had a PCN reaction occurring within the last 10 years: No If all of the above answers are "NO", then may proceed with Cephalosporin use..   childhood reaction, swelling & had to be given an anti    Social History:  Social History   Socioeconomic History  . Marital status: Divorced    Spouse name: Not on file  . Number of children: Not on file  . Years of education: Not on file  . Highest education level: Not on file  Occupational History  . Not on file  Social Needs  . Financial resource strain: Not on file  . Food insecurity    Worry: Not on file    Inability: Not on file  . Transportation needs    Medical: Not on file    Non-medical: Not on file  Tobacco Use  . Smoking status: Never Smoker  . Smokeless tobacco: Never Used  Substance and Sexual Activity  . Alcohol use: No  . Drug use: No  . Sexual activity: Yes  Lifestyle  . Physical activity    Days per week: Not on file    Minutes per session: Not on file  . Stress: Not on file  Relationships  . Social Herbalist on phone: Not on file    Gets together: Not on file    Attends religious service: Not on file    Active member of club or organization: Not on file    Attends meetings of clubs or organizations: Not on file     Relationship status: Not on file  . Intimate partner violence    Fear of current or ex partner: Not on file    Emotionally abused: Not on file    Physically abused: Not on file    Forced sexual activity: Not on file  Other Topics Concern  . Not on file  Social History Narrative  . Not on file    Family History:   Family History  Problem Relation Age of Onset  . Heart failure Mother   . Hypertension Mother   . CAD Father   . Hypertension Other   . CAD Other      ROS:  Please see the history of present illness.  All other ROS reviewed and negative.     Physical Exam/Data:   Vitals:   05/16/19 1009  Pulse: 91  Resp: 20  Temp: 98.1 F (36.7 C)  TempSrc: Oral  SpO2: 92%   No intake or output data in the 24 hours ending 05/16/19 1134 Last 3 Weights 10/09/2018 09/24/2018 08/06/2018  Weight (lbs) 267 lb 277 lb 9.6 oz 273 lb  Weight (kg) 121.11 kg 125.919 kg 123.832 kg     There is no height or weight on file to calculate BMI.  General:  Well nourished, well developed, in no acute distress HEENT: normal Lymph: no adenopathy Neck: no JVD Endocrine:  No thryomegaly Vascular: No carotid bruits Cardiac:  RRR; no murmurs, gallops or rubs Lungs:  CTA b/l, no wheezing, rhonchi or rales  Abd: soft, nontender Ext: no edema Musculoskeletal:  No deformities, bandage on R shoulder (not removed) Skin: warm and dry  Neuro:  No gross focal abnormalities noted Psych:  Normal affect, very animated, pleasent  EKG:  The EKG was personally reviewed and demonstrates:   SR, 99bpm, basleine artifact without clear evidence of ischemia, PVCs, measured QT by me is 360-335m, QTc 462-4871m Telemetry:  Telemetry was personally reviewed and demonstrates:    SR, occ PVCs observed  Relevant CV Studies:  09/27/2018; TTE IMPRESSIONS 1. The left ventricle has severely reduced systolic function, with an ejection fraction of 25-30%. The cavity size was mildly dilated. There is mildly increased  left ventricular wall thickness. Left  ventricular diastolic Doppler parameters are  consistent with impaired relaxation Left ventricular diffuse hypokinesis.  2. The right ventricle has normal systolic function. The cavity was normal. There is no increase in right ventricular wall thickness.  3. Left atrial size was mildly dilated.  4. Right atrial size was mildly dilated.  5. The mitral valve is normal in structure. No evidence of mitral valve stenosis. Mild regurgitation.  6. The tricuspid valve is normal in structure.  7. The aortic valve is tricuspid There is mild calcification of the aortic valve. No stenosis.  8. The pulmonic valve was normal in structure.  9. The aortic root and ascending aorta are normal in size and structure. 10. Right atrial pressure is estimated at 3 mmHg. 11. PA systolic pressure 26 mmHg.   LHC 08/31/10 [facility], [place]: Right dominant. Left main free of disease, branches into LAD and cicrumflex. Large caliber vessels with no significant stenosis. No intimal irregularities in RCA. Elevated LVEDP at 40   Laboratory Data:  High Sensitivity Troponin:  No results for input(s): TROPONINIHS in the last 720 hours.   ChemistryNo results for input(s): NA, K, CL, CO2, GLUCOSE, BUN, CREATININE, CALCIUM, GFRNONAA, GFRAA, ANIONGAP in the last 168 hours.  No results for input(s): PROT, ALBUMIN, AST, ALT, ALKPHOS, BILITOT in the last 168 hours. Hematology Recent Labs  Lab 05/16/19 1031  WBC 10.2  RBC 5.26  HGB 13.9  HCT 45.0  MCV 85.6  MCH 26.4  MCHC 30.9  RDW 23.4*  PLT 301   BNPNo results for input(s): BNP, PROBNP in the last 168 hours.  DDimer No results for input(s): DDIMER in the last 168 hours.   Radiology/Studies:   Dg Chest Port 1 View Result Date: 05/16/2019 CLINICAL DATA:  Fatigue EXAM: PORTABLE CHEST 1 VIEW COMPARISON:  08/13/2016 FINDINGS: Cardiac shadow is mildly prominent slightly enlarged from the prior exam but accentuated by the portable  technique. Defibrillator is again noted and stable. Minimal basilar atelectasis is seen. No effusion or pneumothorax is noted. No focal infiltrate is seen. IMPRESSION: Minimal bibasilar atelectasis.  No other focal abnormality is noted. Electronically Signed   By: Inez Catalina M.D.   On: 05/16/2019 10:50    Assessment and Plan:   1. Syncope     Correlates with timing of VF/device therapies timing  2. PMVT/VF     He reports compliance with his coreg     Markedly hypokalemic (replacement ordered)     He had VT Aug1 treated as well (I can not see these events with interrogation), I will ask rep to do full interrogation.  3. MSSA R shoulder infection     Sounds like he was on antibiotics for some time, planned for surgery in Feb, though not done     His device pocket/site looks good, no symptoms, signs of infection     His shoulder will need to be addressed, it puts him at high risk of device system infection  4. Hypokalemia     Replacement ordered, repeat level ordered as well  5. AKI     ? Addition of the Entresto     Deferred to AHF team     He does not appear volume OL   He had therapy a month ago, and may require AAD at this juncture, though he is currently quite hypokalemic K+ replacement a good 1st place to start  will discuss with Dr. Rayann Heman, he will see him later today    AHF team will be admitting  For questions or updates, please contact [place] Please consult [email or web address] for contact info under   Signed, Baldwin Jamaica, PA-C  05/16/2019 11:34 AM   EP Attending  Patient seen and examined. He is well known to me from prior clinic visits but has been absent from our clinic for a couple of years. He presented with VF storm in the setting of non-compliance and hypokalemia. I would recommend we start amiodarone 400 bid for a week then 400 mg daily for 2 months then stop amiodarone. He will need potassium supp and agree with initiation of aldactone which should  also help with his hypokalemia. We will follow with you. Once potassium is replete and he is euvolemic, would be ok to discharge home with early followup.  Mikle Bosworth.D.

## 2019-05-16 NOTE — Telephone Encounter (Signed)
Sent Patient Media planner for [employer] to [employer].  Fax: [phone] Phone: [phone] Status is pending. Will follow-up as appropriate.  Audry Riles, PharmD, BCPS, CPP Heart Failure Clinic Pharmacist [phone]

## 2019-05-16 NOTE — Telephone Encounter (Signed)
  Reviewed note from Murray Hodgkins, NP.   Pt had lightheadedness throughout the day 9/30 with + syncopal episode. Also with history of recent ICD shock in August.   Manual transmission sent which shows what appear to be appropriate shocks 9/29 at 0708 with ATP x 2 -> shock, and 9/30 0118 with ATP x 1 -> shock, and 9/30 at 1651 -> shock.   LMOM. And then patient answered on call back.  Pt aware and will head to [facility] for further evaluation and treatment. He was unaware of any of the shocks, and only had the one syncopal episode that he remembers.   Pt verbalizes understanding to NOT drive, and his sister will give him a ride to [facility].   Tommye Standard, PA made aware.

## 2019-05-16 NOTE — ED Triage Notes (Signed)
Pt reports defibrillator fired 3 times in 3 days. Had syncopal event last night. Sent by Dr. Billee Cashing Clean. Denies CP at present.

## 2019-05-16 NOTE — ED Notes (Signed)
Dinner tray ordered.

## 2019-05-16 NOTE — ED Notes (Signed)
Urine Culture sent to Main Lab with UA 

## 2019-05-16 NOTE — Telephone Encounter (Addendum)
CSW received call from pt who informed CSW of his recent lightheadedness and fall- was wondering if he needed to come in for an appt.  CSW discussed with RN who will follow up with patient.  CSW also brought additional documentation from pt sister for pt [employer] application.  They could not find food stamps awards statement so CSW called DSS and they faxed to CSW to be included in packet.  CSW took application to mail from to be sent financial department for processing.  CSW will continue to follow and assist as needed  Jorge Ny, [facility] Desk#: [phone] Cell#: [phone]

## 2019-05-17 ENCOUNTER — Encounter (HOSPITAL_COMMUNITY): Payer: Self-pay | Admitting: *Deleted

## 2019-05-17 DIAGNOSIS — I4901 Ventricular fibrillation: Principal | ICD-10-CM

## 2019-05-17 LAB — BASIC METABOLIC PANEL
Anion gap: 13 (ref 5–15)
Anion gap: 12 (ref 5–15)
BUN: 24 mg/dL — ABNORMAL HIGH (ref 6–20)
BUN: 20 mg/dL (ref 6–20)
CO2: 24 mmol/L (ref 22–32)
CO2: 23 mmol/L (ref 22–32)
Calcium: 8.3 mg/dL — ABNORMAL LOW (ref 8.9–10.3)
Calcium: 7.9 mg/dL — ABNORMAL LOW (ref 8.9–10.3)
Chloride: 93 mmol/L — ABNORMAL LOW (ref 98–111)
Chloride: 97 mmol/L — ABNORMAL LOW (ref 98–111)
Creatinine, Ser: 1.16 mg/dL (ref 0.61–1.24)
Creatinine, Ser: 1.16 mg/dL (ref 0.61–1.24)
GFR calc Af Amer: >60 mL/min (ref >60)
GFR calc Af Amer: >60 mL/min (ref >60)
GFR calc non Af Amer: >60 mL/min (ref >60)
GFR calc non Af Amer: >60 mL/min (ref >60)
Glucose, Bld: 166 mg/dL — ABNORMAL HIGH (ref 70–99)
Glucose, Bld: 144 mg/dL — ABNORMAL HIGH (ref 70–99)
Potassium: 2.6 mmol/L — CL (ref 3.5–5.1)
Potassium: 2.9 mmol/L — ABNORMAL LOW (ref 3.5–5.1)
Sodium: 130 mmol/L — ABNORMAL LOW (ref 135–145)
Sodium: 132 mmol/L — ABNORMAL LOW (ref 135–145)

## 2019-05-17 LAB — MAGNESIUM
Magnesium: 1.9 mg/dL (ref 1.7–2.4)
Magnesium: 2.1 mg/dL (ref 1.7–2.4)

## 2019-05-17 MED ORDER — POTASSIUM CHLORIDE 10 MEQ/100ML IV SOLN
10.0000 meq | INTRAVENOUS | Status: AC
  Administered 2019-05-17 (×4): 10 meq via INTRAVENOUS
  Filled 2019-05-17 (×4): qty 100

## 2019-05-17 MED ORDER — AMIODARONE HCL 200 MG PO TABS
400.0000 mg | ORAL_TABLET | Freq: Two times a day (BID) | ORAL | Status: DC
  Administered 2019-05-17 – 2019-05-18 (×3): 400 mg via ORAL
  Filled 2019-05-17 (×3): qty 2

## 2019-05-17 MED ORDER — MAGNESIUM SULFATE IN D5W 1-5 GM/100ML-% IV SOLN
1.0000 g | Freq: Once | INTRAVENOUS | Status: AC
  Administered 2019-05-17: 1 g via INTRAVENOUS
  Filled 2019-05-17: qty 100

## 2019-05-17 MED ORDER — SPIRONOLACTONE 25 MG PO TABS
25.0000 mg | ORAL_TABLET | Freq: Every day | ORAL | Status: DC
  Administered 2019-05-17 – 2019-05-18 (×2): 25 mg via ORAL
  Filled 2019-05-17 (×3): qty 1

## 2019-05-17 MED ORDER — POTASSIUM CHLORIDE 10 MEQ/100ML IV SOLN
10.0000 meq | INTRAVENOUS | Status: AC
  Administered 2019-05-17 (×4): 10 meq via INTRAVENOUS
  Filled 2019-05-17 (×4): qty 100

## 2019-05-17 MED ORDER — INFLUENZA VAC SPLIT QUAD 0.5 ML IM SUSY
0.5000 mL | PREFILLED_SYRINGE | INTRAMUSCULAR | Status: AC
  Administered 2019-05-18: 0.5 mL via INTRAMUSCULAR
  Filled 2019-05-17: qty 0.5

## 2019-05-17 MED ORDER — POTASSIUM CHLORIDE ER 10 MEQ PO TBCR
40.0000 meq | EXTENDED_RELEASE_TABLET | Freq: Once | ORAL | Status: AC
  Administered 2019-05-17: 40 meq via ORAL
  Filled 2019-05-17 (×2): qty 4

## 2019-05-17 MED ORDER — POTASSIUM CHLORIDE CRYS ER 10 MEQ PO TBCR
EXTENDED_RELEASE_TABLET | ORAL | Status: AC
  Filled 2019-05-17: qty 4

## 2019-05-17 MED ORDER — POTASSIUM CHLORIDE CRYS ER 20 MEQ PO TBCR
20.0000 meq | EXTENDED_RELEASE_TABLET | Freq: Once | ORAL | Status: AC
  Administered 2019-05-17: 20 meq via ORAL
  Filled 2019-05-17: qty 1

## 2019-05-17 MED ORDER — ALPRAZOLAM 0.25 MG PO TABS
0.5000 mg | ORAL_TABLET | Freq: Once | ORAL | Status: AC
  Administered 2019-05-17: 09:00:00 0.5 mg via ORAL
  Filled 2019-05-17: qty 2

## 2019-05-17 MED ORDER — POTASSIUM CHLORIDE CRYS ER 20 MEQ PO TBCR
40.0000 meq | EXTENDED_RELEASE_TABLET | ORAL | Status: AC
  Administered 2019-05-17 (×2): 40 meq via ORAL
  Filled 2019-05-17: qty 2

## 2019-05-17 MED ORDER — ALPRAZOLAM 0.25 MG PO TABS
0.2500 mg | ORAL_TABLET | Freq: Three times a day (TID) | ORAL | Status: DC | PRN
  Administered 2019-05-17: 0.25 mg via ORAL
  Filled 2019-05-17: qty 1

## 2019-05-17 NOTE — Progress Notes (Addendum)
Advanced Heart Failure Rounding Note  PCP-Cardiologist: No primary care provider on file.   Subjective:     Irritated. Still in ED waiting on bed. Poor sleep last night. Anxious. ED bed uncomfortable. Requesting meds for sleep/ anxiety. No cardiac symptoms.    Objective:   Weight Range:   There is no height or weight on file to calculate BMI.   Vital Signs:   Temp:  [98.1 F (36.7 C)] 98.1 F (36.7 C) (10/01 2241) Pulse Rate:  [91-104] 94 (10/02 0400) Resp:  [15-29] 23 (10/02 0700) BP: (85-145)/(42-88) 145/80 (10/02 0700) SpO2:  [84 %-97 %] 97 % (10/02 0400)    Weight change: There were no vitals filed for this visit.  Intake/Output:   Intake/Output Summary (Last 24 hours) at 05/17/2019 4332 Last data filed at 05/16/2019 1859 Gross per 24 hour  Intake 1825 ml  Output -  Net 1825 ml      Physical Exam    General:  Well appearing. No resp difficulty HEENT: Normal Neck: Supple. JVP . Carotids 2+ bilat; no bruits. No lymphadenopathy or thyromegaly appreciated. Cor: PMI nondisplaced. Regular rate & rhythm. No rubs, gallops or murmurs. Lungs: Clear Abdomen: Soft, nontender, nondistended. No hepatosplenomegaly. No bruits or masses. Good bowel sounds. Extremities: No cyanosis, clubbing, rash, edema Neuro: Alert & orientedx3, cranial nerves grossly intact. moves all 4 extremities w/o difficulty. Affect pleasant   Telemetry   Sinus tach, 100 w/ occasional PVCs and 2 3-4 beat runs of NSVT  EKG   f/u 12 lead EKG pending   Labs    CBC Recent Labs    05/16/19 1031 05/16/19 1627  WBC 10.2 8.3  HGB 13.9 12.5*  HCT 45.0 41.3  MCV 85.6 85.5  PLT 301 951   Basic Metabolic Panel Recent Labs    05/16/19 1031  05/16/19 1932 05/17/19 0438  NA 135  --  132* 130*  K 2.7*   < > 3.0* 2.6*  CL 91*  --  91* 93*  CO2 25  --  27 24  GLUCOSE 118*  --  117* 166*  BUN 27*  --  27* 24*  CREATININE 1.95*   < > 1.76* 1.16  CALCIUM 8.9  --  8.5* 8.3*  MG 2.1  --    --  1.9   < > = values in this interval not displayed.   Liver Function Tests No results for input(s): AST, ALT, ALKPHOS, BILITOT, PROT, ALBUMIN in the last 72 hours. No results for input(s): LIPASE, AMYLASE in the last 72 hours. Cardiac Enzymes No results for input(s): CKTOTAL, CKMB, CKMBINDEX, TROPONINI in the last 72 hours.  BNP: BNP (last 3 results) Recent Labs    09/24/18 1550  BNP 62.4    ProBNP (last 3 results) No results for input(s): PROBNP in the last 8760 hours.   D-Dimer No results for input(s): DDIMER in the last 72 hours. Hemoglobin A1C No results for input(s): HGBA1C in the last 72 hours. Fasting Lipid Panel No results for input(s): CHOL, HDL, LDLCALC, TRIG, CHOLHDL, LDLDIRECT in the last 72 hours. Thyroid Function Tests No results for input(s): TSH, T4TOTAL, T3FREE, THYROIDAB in the last 72 hours.  Invalid input(s): FREET3  Other results: 2D Echo 05/16/19  1. Left ventricular ejection fraction, by visual estimation, is 40 to 45%. The left ventricle has mild to moderately decreased function. Mildly increased left ventricular size. Left ventricular septal wall thickness was normal. There is no left  ventricular hypertrophy.  2. Definity contrast  agent was given IV to delineate the left ventricular endocardial borders.  3. Left ventricular diastolic Doppler parameters are consistent with impaired relaxation pattern of LV diastolic filling.  4. Abnormal septal motion Even with definity RWMAls hard to decipher Abnormal septal motion EF appears to have improved since 09/2018.  5. Global right ventricle has normal systolic function.The right ventricular size is normal. No increase in right ventricular wall thickness.  6. Pacing wires in RA/RV.  7. Left atrial size was moderately dilated.  8. Right atrial size was not well visualized.  9. Moderate thickening of the mitral valve leaflet(s). 10. The mitral valve is normal in structure. Trace mitral valve regurgitation.  11. The tricuspid valve is not well visualized. Tricuspid valve regurgitation is mild. 12. The aortic valve was not well visualized Aortic valve regurgitation was not visualized by color flow Doppler. Mild to moderate aortic valve sclerosis/calcification without any evidence of aortic stenosis. 13. The pulmonic valve was not well visualized. Pulmonic valve regurgitation was not assessed by color flow Doppler. 14. The aortic root was not well visualized. 15. The interatrial septum was not well visualized.   Imaging    Dg Chest Port 1 View  Result Date: 05/16/2019 CLINICAL DATA:  Fatigue EXAM: PORTABLE CHEST 1 VIEW COMPARISON:  08/13/2016 FINDINGS: Cardiac shadow is mildly prominent slightly enlarged from the prior exam but accentuated by the portable technique. Defibrillator is again noted and stable. Minimal basilar atelectasis is seen. No effusion or pneumothorax is noted. No focal infiltrate is seen. IMPRESSION: Minimal bibasilar atelectasis.  No other focal abnormality is noted. Electronically Signed   By: Alcide Clever M.D.   On: 05/16/2019 10:50      Medications:     Scheduled Medications: . carvedilol  3.125 mg Oral BID WC  . heparin  5,000 Units Subcutaneous Q8H  . spironolactone  25 mg Oral Daily     Infusions: . potassium chloride 10 mEq (05/17/19 0646)     PRN Medications:      Patient Profile   Justin Bernard is a 58 y.o. 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   Assessment/Plan   1. Syncope: remote device interrogation revealed VT/VF w/ defibrillation x 2 on 05/15/19. -In the setting of hypokalemia w/ initial K of 2.7. Mg 2.1 -K supplementation ordered. See below. -Keep K >4.0 and Mg >2.0 -Currently sinus tach, low 100s on tele w/ two 3-4 beat runs of NSVT. No recurrent sustained VT/VF -May  need IV amiodarone if recurrent ventricular arrhythmias -Continue Coreg  -Avoid QT prolonging agents -No recent CP to suggest ischemic etiology. Hs troponin 55>>41>>39, likely 2/2 VF and ICD shocks. - 2D echo 10/1 showed EF 40-45% (improved from 09/2018- EF 25-30%)  2. Hypokalemia: initial K 2.7. Mg 2.1 -suspect hypokalemia contributed to VF -EKG w/ prolonged Qt/QTc  401/515 ms. Repeat 12 lead EKG ordered -K improved to 3.0 last PM after supplementation, followed by diarrhea x 1>>K 2.6 this am -Give additional K, 10 mEq IV Qh x 4, + 60 mEq PO -Mg 1.9>> give IV Mg 1g x 1 now -Repeat K and Mg labs at 1200 -Suspect hypokalemia caused by over diuresis after recent addition of Entresto. Volume is stable.  -Entresto and Lasix on hold -Spironolactone added, now on 25 mg daily. BP stable -avoid Qt prolonging agents -monitor QTc  3. Chronic Systolic HF/NICM: may be  familial given + FH in multiple relatives. EF as low as 15 % previously but improved w/ medications, as high as 40-45%. LHC in 2011 in [place] showed normal coronaries. Last echo 09/2018 showed further decline in EF down to 25-30% w/ diffuse hypokinesis.  - Continue Coreg 3.125 mg bid - Entresto held on admit due to AKI - Spironolactone added for hypokalemia. Will continue 25 mg daily - Has ICD - Euvolemic on exam. NYHA Functional Class II symptoms - Home dry weight 254 lb. Has been stable per pt report  4. MSSA osteomyelitis of the right shoulder:  was initially scheduled to undergo debridement in Feb but this unfortunately was postponed due to [place] pandemic and has not yet been rescheduled. Has been followed by ortho and ID. Not currently on antibiotics - Has ICD and would be concerned for device infection but no current signs of systemic infection. Afebrile. WBC ct WNL.  - He will continue outpatient f/u w/ ortho and ID  5. AKI: SCr 1.95 on admit. Baseline SCr ~0.9-1.1. BUN 27 - Suspect over diuresis after addition of Entresto +  standing PO Lasix dose - Entresto and Lasix held - received gentle hydration 75 mL/hr x 5 hrs - AKI resolved. SCr 1.16. BUN improved from 27>>24  6. Insomnia and Anxiety: requesting sleep aid  - will discuss w/ Dr. Shirlee Latch. Can try xanax 0.25 TID   Length of Stay: 1  Brittainy Simmons, PA-C  05/17/2019, 7:12 AM  Advanced Heart Failure Team Pager [phone] (M-F; 7a - 4p)  Please contact [facility] for night-coverage after hours (4p -7a ) and weekends on [email or web address]  Patient seen with PA, agree with the above note.   No further ventricular arrhythmias. No dyspnea/chest pain, but anxious because unable to get any sleep.  Creatinine is back to baseline but potassium remains very low.   General: NAD Neck: No JVD, no thyromegaly or thyroid nodule.  Lungs: Clear to auscultation bilaterally with normal respiratory effort. CV: Nondisplaced PMI.  Heart regular S1/S2, no S3/S4, no murmur.  1+ ankle edema.  No carotid bruit.  Normal pedal pulses.  Abdomen: Soft, nontender, no hepatosplenomegaly, no distention.  Skin: Intact without lesions or rashes.  Neurologic: Alert and oriented x 3.  Psych: Normal affect. Extremities: No clubbing or cyanosis.  HEENT: Normal.   I suspect that the VF was related to underlying nonischemic cardiomyopathy + marked hypokalemia.  K is still low.  - Will aggressively replace K again today, repeat BMET this afternoon.  - He is not volume overloaded so I will stop Lasix for now.   - Think we can hold off on anti-arrhythmic for now with marked hypokalemia.  - Continue Coreg but use lower dose 3.125 mg bid with low BP.  Increase back to 6.25 mg bid as BP stabilizes (better today).  - Spironolactone 25 mg daily.  - Will likely need to keep until tomorrow to get K straightened out.   Echo this admission with EF 40-45%, somewhat improved.  Creatinine back to baseline.   - Stop Lasix and Entresto.  BP may be too low for Entresto going forwards, start on low dose  losartan probably in am.   Chronic MSSA osteomyelitis right shoulder => debridement has been put off.  This will need to be eventually dealt with. He is afebrile with normal WBCs, so doubt sepsis.   Will give him a Xanax dose to help with anxiety.   Marca Ancona 05/17/2019 8:14 AM

## 2019-05-17 NOTE — ED Notes (Signed)
SDU  Paged cardio to RN [facility]

## 2019-05-17 NOTE — Progress Notes (Addendum)
Progress Note  Patient Name: Justin Bernard Date of Encounter: 05/17/2019  Primary Cardiologist: Dr. Shirlee Latch  Subjective   No CP or SOB  Inpatient Medications    Scheduled Meds: . amiodarone  400 mg Oral BID  . carvedilol  3.125 mg Oral BID WC  . heparin  5,000 Units Subcutaneous Q8H  . spironolactone  25 mg Oral Daily   Continuous Infusions: . magnesium sulfate bolus IVPB 1 g (05/17/19 1023)   PRN Meds: ALPRAZolam   Vital Signs    Vitals:   05/17/19 0849 05/17/19 0850 05/17/19 0900 05/17/19 1024  BP: (!) 89/66 (!) 89/66 109/65 103/73  Pulse: (!) 106 (!) 103 100 98  Resp: 20  (!) 25   Temp:    98.2 F (36.8 C)  TempSrc:    Oral  SpO2: 91%  92% 93%  Weight:    119.3 kg  Height:    6' 3.5" (1.918 m)    Intake/Output Summary (Last 24 hours) at 05/17/2019 1121 Last data filed at 05/17/2019 0845 Gross per 24 hour  Intake 1925 ml  Output -  Net 1925 ml   Last 3 Weights 05/17/2019 10/09/2018 09/24/2018  Weight (lbs) 262 lb 14.4 oz 267 lb 277 lb 9.6 oz  Weight (kg) 119.251 kg 121.11 kg 125.919 kg      Telemetry    SR occ PVC/couplet - Personally Reviewed  ECG    No new EKGs - Personally Reviewed  Physical Exam   GEN: No acute distress.   Neck: No JVD Cardiac: RRR, no murmurs, rubs, or gallops.  Respiratory: CTA b/l. GI: Soft, nontender, non-distended  MS: No edema; No deformity. Neuro:  Nonfocal  Psych: Normal affect   Labs    High Sensitivity Troponin:   Recent Labs  Lab 05/16/19 1031 05/16/19 1627 05/16/19 1932  TROPONINIHS 55* 41* 39*      Chemistry Recent Labs  Lab 05/16/19 1031 05/16/19 1627 05/16/19 1932 05/17/19 0438  NA 135  --  132* 130*  K 2.7* 2.8* 3.0* 2.6*  CL 91*  --  91* 93*  CO2 25  --  27 24  GLUCOSE 118*  --  117* 166*  BUN 27*  --  27* 24*  CREATININE 1.95* 1.66* 1.76* 1.16  CALCIUM 8.9  --  8.5* 8.3*  GFRNONAA 37* 45* 42* >60  GFRAA 43* 53* 49* >60  ANIONGAP 19*  --  14 13     Hematology Recent Labs   Lab 05/16/19 1031 05/16/19 1627  WBC 10.2 8.3  RBC 5.26 4.83  HGB 13.9 12.5*  HCT 45.0 41.3  MCV 85.6 85.5  MCH 26.4 25.9*  MCHC 30.9 30.3  RDW 23.4* 23.2*  PLT 301 296    BNPNo results for input(s): BNP, PROBNP in the last 168 hours.   DDimer No results for input(s): DDIMER in the last 168 hours.   Radiology    Dg Chest Port 1 View  Result Date: 05/16/2019 CLINICAL DATA:  Fatigue EXAM: PORTABLE CHEST 1 VIEW COMPARISON:  08/13/2016 FINDINGS: Cardiac shadow is mildly prominent slightly enlarged from the prior exam but accentuated by the portable technique. Defibrillator is again noted and stable. Minimal basilar atelectasis is seen. No effusion or pneumothorax is noted. No focal infiltrate is seen. IMPRESSION: Minimal bibasilar atelectasis.  No other focal abnormality is noted. Electronically Signed   By: Alcide Clever M.D.   On: 05/16/2019 10:50    Cardiac Studies   09/27/2018; TTE IMPRESSIONS 1. The left ventricle has  severely reduced systolic function, with an ejection fraction of 25-30%. The cavity size was mildly dilated. There is mildly increased left ventricular wall thickness. Left ventricular diastolic Doppler parameters are  consistent with impaired relaxation Left ventricular diffuse hypokinesis. 2. The right ventricle has normal systolic function. The cavity was normal. There is no increase in right ventricular wall thickness. 3. Left atrial size was mildly dilated. 4. Right atrial size was mildly dilated. 5. The mitral valve is normal in structure. No evidence of mitral valve stenosis. Mild regurgitation. 6. The tricuspid valve is normal in structure. 7. The aortic valve is tricuspid There is mild calcification of the aortic valve. No stenosis. 8. The pulmonic valve was normal in structure. 9. The aortic root and ascending aorta are normal in size and structure. 10. Right atrial pressure is estimated at 3 mmHg. 11. PA systolic pressure 26 mmHg.   LHC  08/31/10 [facility], [place]: Right dominant. Left main free of disease, branches into LAD and cicrumflex. Large caliber vessels with no significant stenosis. No intimal irregularities in RCA. Elevated LVEDP at 40  Patient Profile     58 y.o. male with a hx of NICM, (clean cath in 2012), ? If genetic, w/ICD (implanted in [place]), initiallyLVAD and transplantwere considered but he did very well with improvment in EF, DM, seasonal asthma, essential tremor, chronic venous stasis, reports of h/o ETOH abuse, (pt denies any ETOH in years), referred to the ER after reports of syncope and device remote noted corresponding VF/therapies   Device information BSCi single chamber ICD implanted 10/06/2010 (in [place]) (as far as I can tell for primary prevention)  Interrogation (remote) Battery and lead impedence/sensing are good 05/15/2019: 01:18 ATPx1 failed > shock x1 successful, appropriate for PMVT/VF 16:51 shock x1 successful appropriate for VF 05/14/2019 07:08 ATP x2 failed, shock x1 successful, appropriate for PMVT / VF 03/16/2019 VT in monitor zone, no therapies (x8) VT w/ATPx1 failed, shock (x1) (no EGMs for immediate review)   Assessment & Plan    1. Syncope     Correlates with timing of VF/device therapies timing  2. PMVT/VF     He reports compliance with his coreg     Remains markedly hypokalemic despite PO/IV replacement yesterday     attending/CHF team is addressing  3. MSSA R shoulder infection     Sounds like he was on antibiotics for some time, planned for surgery in Feb, though not done     His device pocket/site looks good, no symptoms, signs of infection     His shoulder will need to be addressed, it puts him at high risk of device system infection  4. AKI    much better    AHF team managing meds   Dr. Lovena Le has seen and examined the patient today Continue aggressive K+ replacement Will start 400mg  BID amiodarone for a week then reduce to 400mg  daily for 2  months, then stop  For questions or updates, please contact [facility] Please consult [email or web address] for contact info under     Signed, Baldwin Jamaica, PA-C  05/17/2019, 11:21 AM    EP Attending  Patient seen and examined. Agree with the findings as noted above. The patient presents with recurrent VF in the setting of hypokalemia and non-compliance. He is being repleted and has been started on amiodarone which I would expect him to take for 2 months. He can be discharged once we know that his potassium is adequately repleted.   Mikle Bosworth.D.

## 2019-05-17 NOTE — ED Notes (Signed)
Cardiology at bedside.

## 2019-05-17 NOTE — ED Notes (Signed)
SDU  Breakfast ordered  

## 2019-05-18 ENCOUNTER — Encounter (HOSPITAL_COMMUNITY): Payer: Self-pay | Admitting: Physician Assistant

## 2019-05-18 ENCOUNTER — Other Ambulatory Visit: Payer: Self-pay | Admitting: Physician Assistant

## 2019-05-18 DIAGNOSIS — I428 Other cardiomyopathies: Secondary | ICD-10-CM

## 2019-05-18 DIAGNOSIS — I472 Ventricular tachycardia, unspecified: Secondary | ICD-10-CM

## 2019-05-18 DIAGNOSIS — E876 Hypokalemia: Secondary | ICD-10-CM

## 2019-05-18 DIAGNOSIS — R55 Syncope and collapse: Secondary | ICD-10-CM

## 2019-05-18 DIAGNOSIS — N179 Acute kidney failure, unspecified: Secondary | ICD-10-CM

## 2019-05-18 LAB — BASIC METABOLIC PANEL
Anion gap: 10 (ref 5–15)
BUN: 16 mg/dL (ref 6–20)
CO2: 25 mmol/L (ref 22–32)
Calcium: 8.3 mg/dL — ABNORMAL LOW (ref 8.9–10.3)
Chloride: 99 mmol/L (ref 98–111)
Creatinine, Ser: 1 mg/dL (ref 0.61–1.24)
GFR calc Af Amer: >60 mL/min (ref >60)
GFR calc non Af Amer: >60 mL/min (ref >60)
Glucose, Bld: 131 mg/dL — ABNORMAL HIGH (ref 70–99)
Potassium: 3.7 mmol/L (ref 3.5–5.1)
Sodium: 134 mmol/L — ABNORMAL LOW (ref 135–145)

## 2019-05-18 LAB — HEPATIC FUNCTION PANEL
ALT: 57 U/L — ABNORMAL HIGH (ref 0–44)
AST: 74 U/L — ABNORMAL HIGH (ref 15–41)
Albumin: 3.1 g/dL — ABNORMAL LOW (ref 3.5–5.0)
Alkaline Phosphatase: 85 U/L (ref 38–126)
Bilirubin, Direct: 0.2 mg/dL (ref 0.0–0.2)
Indirect Bilirubin: 0.4 mg/dL (ref 0.3–0.9)
Total Bilirubin: 0.6 mg/dL (ref 0.3–1.2)
Total Protein: 6.1 g/dL — ABNORMAL LOW (ref 6.5–8.1)

## 2019-05-18 LAB — TSH: TSH: 3.46 u[IU]/mL (ref 0.350–4.500)

## 2019-05-18 LAB — MAGNESIUM: Magnesium: 1.9 mg/dL (ref 1.7–2.4)

## 2019-05-18 MED ORDER — POTASSIUM CHLORIDE CRYS ER 20 MEQ PO TBCR: 30.0000 meq | EXTENDED_RELEASE_TABLET | Freq: Every day | ORAL | 6 refills | Status: DC

## 2019-05-18 MED ORDER — SPIRONOLACTONE 25 MG PO TABS: 25.0000 mg | ORAL_TABLET | Freq: Every day | ORAL | 6 refills | Status: DC

## 2019-05-18 MED ORDER — CARVEDILOL 3.125 MG PO TABS: 3.1250 mg | ORAL_TABLET | Freq: Two times a day (BID) | ORAL | 6 refills | Status: DC

## 2019-05-18 MED ORDER — AMIODARONE HCL 200 MG PO TABS: ORAL_TABLET | ORAL | 1 refills | Status: DC

## 2019-05-18 NOTE — Discharge Summary (Addendum)
Discharge Summary    Patient ID: Justin Bernard MRN: [id number]; DOB: Sep 02, 1960  Admit date: 05/16/2019 Discharge date: 05/18/2019  Primary Care Provider: Marda Stalker, PA-C  Primary Cardiologist: Justin Champagne, MD  Primary Electrophysiologist:  Justin Peru, MD   Discharge Diagnoses    Principal Problem:   Ventricular tachyarrhythmia [facility] [place]) Active Problems:   Syncope and collapse   Hypokalemia   Chronic systolic CHF (congestive heart failure), NYHA class 2 ([place])   ICD (implantable cardioverter-defibrillator) discharge   NICM (nonischemic cardiomyopathy) ([place])   AKI (acute kidney injury) ([place])   Allergies Allergies  Allergen Reactions  . Penicillins Other (See Comments)    Has patient had a PCN reaction causing immediate rash, facial/tongue/throat swelling, SOB or lightheadedness with hypotension: Yes Has patient had a PCN reaction causing severe rash involving mucus membranes or skin necrosis: Yes Has patient had a PCN reaction that required hospitalization:  No Has patient had a PCN reaction occurring within the last 10 years: No If all of the above answers are "NO", then may proceed with Cephalosporin use..   childhood reaction, swelling & had to be given an anti    Diagnostic Studies/Procedures    2D echo 05/16/19 IMPRESSIONS  1. Left ventricular ejection fraction, by visual estimation, is 40 to 45%. The left ventricle has mild to moderately decreased function. Mildly increased left ventricular size. Left ventricular septal wall thickness was normal. There is no left  ventricular hypertrophy.  2. Definity contrast agent was given IV to delineate the left ventricular endocardial borders.  3. Left ventricular diastolic Doppler parameters are consistent with impaired relaxation pattern of LV diastolic filling.  4. Abnormal septal motion Even with definity RWMAls hard to decipher Abnormal septal motion EF appears to have improved since 09/2018.  5. Global right  ventricle has normal systolic function.The right ventricular size is normal. No increase in right ventricular wall thickness.  6. Pacing wires in RA/RV.  7. Left atrial size was moderately dilated.  8. Right atrial size was not well visualized.  9. Moderate thickening of the mitral valve leaflet(s). 10. The mitral valve is normal in structure. Trace mitral valve regurgitation. 11. The tricuspid valve is not well visualized. Tricuspid valve regurgitation is mild. 12. The aortic valve was not well visualized Aortic valve regurgitation was not visualized by color flow Doppler. Mild to moderate aortic valve sclerosis/calcification without any evidence of aortic stenosis. 13. The pulmonic valve was not well visualized. Pulmonic valve regurgitation was not assessed by color flow Doppler. 14. The aortic root was not well visualized. 15. The interatrial septum was not well visualized.  _____________    History of Present Illness     Mr. Justin Bernard is a 58 y.o. male with a hx of NICM (clean cath in 2012 - question if genetic) s/p prior ICD (implanted in [place]), DM, seasonal asthma, essential tremor, chronic venous stasis, remote ETOH abuse (pt denies any ETOH in years) who was admitted with syncope and VT/VF on device interrogation.  He has been followed by Dr. Aundra Bernard. His CHF dates back to 2011 and diagnosed in [place]. He was seen at [facility]. Of note, mother had CHF diagnosis around 46 and grandfather had CHF diagnosis around 31. Pt's disease thought to be NICM. He had Cardiolite 08/13/10 [facility], [place]: areas or reversibility in anterior wall and lesser extent inferior wall. Dilated LV with EF 15% and severe hypokinesis. LHC 08/31/10 [facility], [place]: Right dominant. Left main free of disease, branches into LAD and cicrumflex. Large caliber  vessels with no significant stenosis. No intimal irregularities in RCA. Elevated LVEDP at 40. 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 Initially, LVAD and transplant were considered, but EF improved greatly on medical management. EF previously as low as 15% but at one point had improved to 40-45%. His last echo done at [place] 09/2018 and EF had dropped again to 25-30%. He is s/p [employer] ICD, followed by Dr. Lovena Bernard. 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   He was last seen in AHF clinic in February. Echo was obtained and showed drop in EF to 25-30%. He was switched to [facility] but unable to afford this so switched back to lisinopril. Our office received device alert 04/17/19 for ICD shock/VT event 03/16/19. Office visit was requested but the patient declined to make an appointment at that time due to cost and lack of insurance. Social work got involved. The patient was able to get back on Entresto but unfortunately still re-establish back to clinic for follow-up.  On the night of 9/30, pt had syncopal spell at home and called on call provider. Apparently there was no prodrome or associated CP/dyspnea. When he regained consciousness he was on the floor and did not recall feeling any ICD shocks. He was instructed to submit a remote transmission to device team. Interrogation showed 2 episodes of VF w/ appropriate shocks x 2. Pt advised to come to the ED. He was found to have significant hypokalemia down to 2.7 requiring supplementation and AKI. He was admitted for further management of the problems below:  Hospital Course     1. Syncope with VT/VF: device interrogation revealed VT/VF w/ defibrillation x 2 on 05/15/19. - Suspected precipitated by hypokalemia w/ initial K of 2.7 (Mg 2.1) - Lasix and Entresto discontinued, started on spironolactone, continued on lower dose carvedilol (BP tending to  run soft) - He had not had any recent CP to suggest ischemic etiology. Hs troponin 55>>41>>39, likely 2/2 VF and ICD shocks - 2D echo 05/16/19 showed EF 40-45%, impaired relaxation, globally normal RV function, mild TR, moderate LAE - EP recommended amiodarone 468m BID for a week then reduce to 4054mdaily for 2 months, then possibly stop if stable - baseline TSH and LFTs sent day of discharge, Sabrina Arriaga need EP review on follow-up to decide on plan for PFTs as well depending on longterm plan for amiodarone - He was advised no driving for 6 months  2. Hypokalemia -Suspect hypokalemia caused by over diuresis after recent addition of Entresto - per Dr. CaCurt Bearsrecommend KCl 305mdaily at discharge  3. Chronic Systolic HF/NICM: may be familial given + FH in multiple relatives. EF as low as 15 % previously but improved w/ medications, as high as 40-45%. [place]. Last echo 09/2018 showed further decline in EF down to 25-30% w/ diffuse hypokinesis.  - He Arvid Marengo continue Coreg at lower dose, 3.125 mg bid - Entresto held on admit due to AKI - losartan was considered but not added on day of DC due to soft blood pressures (per discussion with Dr. CamCurt Bears Spironolactone added for hypokalemia/medical therapy - Euvolemic on exam. NYHA Functional Class II symptoms  4.H/o MSSA osteomyelitis of the right shoulder:was initially scheduled to undergo debridement in Febbut  this unfortunately was postponed due to [place] pandemic and has not yet been rescheduled. Has been followed by ortho and ID. The patient had not been on suppressive antibiotics per pharmacy notes since August, and this was not resumed by his inpatient team - No current signs of systemic infection by team. Afebrile. WBC ct WNL.  - He was encouraged to continue outpatient f/u w/ ortho and ID  5. AKI: SCr 1.95 on admit. Baseline SCr ~0.9-1.1 - Suspect over diuresis after addition of Entresto + standing PO  Lasix dose - Entresto and Lasix held and gently hydrated - AKI resolved by time of discharge - Tolerating spironolactone thus far - Ibuprofen discontinued from med list with instructions to aviod  Dr. Curt Bears has seen and examined the patient today and feels he is stable for discharge. As above, addition of losartan was considered but not added per our discussion due to softer BPs. This can be considered in the OP setting by CHF team. He has EP f/u on 05/27/19 which Dr. Curt Bears recommended to keep. He also has CHF f/u on 06/04/19. We Hero Mccathern also arrange a f/u BMET/Mg on Tuesday - have sent message to office schedulers to help arrange. I reviewed discharge instructions with patient who verbalized understanding. 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  _____________  Discharge Vitals Blood pressure 102/70, pulse 90, temperature 97.8 F (36.6 C), temperature source Oral, resp. rate (!) 25, height 6' 3.5" (1.918 m), weight 118.9 kg, SpO2 93 %.  Filed Weights   05/17/19 1024 05/18/19 0653  Weight: 119.3 kg 118.9 kg    Labs & Radiologic Studies    CBC Recent Labs    05/16/19 1031 05/16/19 1627  WBC 10.2 8.3  HGB 13.9 12.5*  HCT 45.0 41.3  MCV 85.6 85.5  PLT 301 185   Basic Metabolic Panel Recent Labs    05/17/19 1329 05/18/19 0401  NA 132* 134*  K 2.9* 3.7  CL 97* 99  CO2 23 25  GLUCOSE 144* 131*  BUN 20 16  CREATININE 1.16 1.00  CALCIUM 7.9* 8.3*  MG 2.1 1.9   High Sensitivity Troponin:   Recent Labs  Lab 05/16/19 1031 05/16/19 1627 05/16/19 1932  TROPONINIHS 55* 41* 73*   _____________  Dg Chest Port 1 View  Result Date: 05/16/2019 CLINICAL DATA:  Fatigue EXAM: PORTABLE CHEST 1 VIEW COMPARISON:  08/13/2016 FINDINGS: Cardiac shadow is mildly prominent slightly enlarged from the prior exam  but accentuated by the portable technique. Defibrillator is again noted and stable. Minimal basilar atelectasis is seen. No effusion or pneumothorax is noted. No focal infiltrate is seen. IMPRESSION: Minimal bibasilar atelectasis.  No other focal abnormality is noted. Electronically Signed   By: Inez Catalina M.D.   On: 05/16/2019 10:50   Disposition   Pt is being discharged home today in good condition.  Follow-up Plans & Appointments    Follow-up Information    [facility] Follow up on 06/04/2019.   Specialty: Cardiology Why: at 3:20 [place] information: [address] [phone] [place] [phone]       [place] [facility] Follow up.   Specialty: Cardiology Why: Please come to the office on Tuesday 05/21/19 for bloodwork to check  electrolyte (BMET, magnesium) between 8am-4pm. See appointment info below with our EP team on 05/27/19 in the [facility] office, and 06/04/19 with the Heart Failure clinic. Contact information: [address] [place] [phone]         Discharge Instructions    Diet - low sodium heart healthy   Complete by: As directed    Discharge instructions   Complete by: As directed    Increase activity slowly as tolerated. Patients who have had an episode of arrhythmia (V-tach or V-fib) and ICD shock are not allowed to drive for 6 months per [place] DMV recommendation.   You have had several medicine changes this admission - PLEASE pay attention to your medication list.  You were started on a medicine called amiodarone with very specific instructions for how to take for the first week and beyond.  You were also started on a medicine called spironolactone to help with your heart function and potassium.  Your carvedilol dose was decreased (new prescription/dose size).  Your potassium dose was  adjusted (new prescription/dose size - but you can use up what you have at home with these new instructions if you would like).  Cefalexin was removed from your medicine list since you indicated you were not taking this. Please make sure to touch base with infectious disease/orthopedic team about plan for your shoulder.  Entresto (sacubitril-valsartan) and Lasix (furosemide) were stopped due to your low potassium level and blood pressure.  Your admitting labwork showed kidney insufficiency. This has improved since admission. However, patients with kidney issues should generally stay away from medicines like ibuprofen, Advil, Motrin, naproxen, and Aleve due to risk of worsening kidney function so do not take any for now. We removed ibuprofen from your medicine list. You may take Tylenol as directed or talk to primary doctor about alternatives for pain issues.   Increase activity slowly   Complete by: As directed       Discharge Medications   Allergies as of 05/18/2019      Reactions   Penicillins Other (See Comments)   Has patient had a PCN reaction causing immediate rash, facial/tongue/throat swelling, SOB or lightheadedness with hypotension: Yes Has patient had a PCN reaction causing severe rash involving mucus membranes or skin necrosis: Yes Has patient had a PCN reaction that required hospitalization:  No Has patient had a PCN reaction occurring within the last 10 years: No If all of the above answers are "NO", then may proceed with Cephalosporin use..  childhood reaction, swelling & had to be given an anti      Medication List    STOP taking these medications   cephALEXin 500 MG capsule Commonly known as: KEFLEX   furosemide 40 MG tablet Commonly known as: LASIX   ibuprofen 200 MG tablet Commonly known as: ADVIL   sacubitril-valsartan 24-26 MG Commonly known as: ENTRESTO     TAKE these medications   albuterol 108 (90 Base) MCG/ACT inhaler Commonly known as: VENTOLIN HFA  Inhale 2 puffs into the lungs every 6 (six) hours as needed for wheezing or shortness of breath.   amiodarone 200 MG tablet Commonly known as: PACERONE Take 2 tablets (400 mg) twice a day for 1 week, then reduce to 2 tablets (446m) once daily.   carvedilol 3.125 MG tablet Commonly known as: COREG Take 1 tablet (3.125 mg total) by mouth 2 (two) times daily with a meal. What changed:   medication strength  how much to  take   glipiZIDE 5 MG 24 hr tablet Commonly known as: GLUCOTROL XL Take 5 mg by mouth daily with breakfast.   glucose monitoring kit monitoring kit 1 each by Does not apply route 4 (four) times daily - after meals and at bedtime. 1 month Diabetic Testing Supplies for QAC-QHS accuchecks.   hydrocortisone cream 1 % Apply 1 application topically 2 (two) times daily as needed for itching.   multivitamin with minerals Tabs tablet Take 1 tablet by mouth daily.   potassium chloride SA 20 MEQ tablet Commonly known as: KLOR-CON Take 1.5 tablets (30 mEq total) by mouth daily. What changed: how much to take   spironolactone 25 MG tablet Commonly known as: ALDACTONE Take 1 tablet (25 mg total) by mouth daily. Start taking on: May 19, 2019       Acute coronary syndrome (MI, NSTEMI, STEMI, etc) this admission?:  No.  The elevated Troponin was due to the acute medical illness or demand ischemia.    Outstanding Labs/Studies   BMET/Mg on Tuesday 05/21/19  Duration of Discharge Encounter   Greater than 30 minutes including physician time.  Signed, Charlie Pitter, PA-C 05/18/2019, 10:15 AM   I have seen and examined this patient with Melina Copa.  Agree with above, note added to reflect my findings.  On exam, RRR, no murmurs, lungs clear.  Discharge today with p.o. potassium repletion.  He Tellis Spivak get labs checked on Tuesday.  Also have started amiodarone.  We Shelbylynn Walczyk have him follow-up in both heart failure and EP clinic.  Brannan Cassedy M. Tahjae Durr MD 05/18/2019 11:05 AM

## 2019-05-18 NOTE — TOC Progression Note (Signed)
Transition of Care [facility]) - Progression Note    Patient Details  Name: Justin Bernard MRN: [id number] Date of Birth: [birth year]  Transition of Care [facility]) CM/SW Contact  Zenon Mayo, RN Phone Number: 05/18/2019, 12:33 PM  Clinical Narrative:    Patient for dc today, NCM assisted patient with Match for medications.       Barriers to Discharge: No Barriers Identified  Expected Discharge Plan and Services           Expected Discharge Date: 05/18/19               DME Arranged: (NA)         HH Arranged: NA           Social Determinants of Health (SDOH) Interventions    Readmission Risk Interventions No flowsheet data found.

## 2019-05-18 NOTE — Progress Notes (Addendum)
Progress Note  Patient Name: Justin Bernard Date of Encounter: 05/18/2019  Primary Cardiologist: Dr. Shirlee Bernard  Subjective   No CP or SOB  Inpatient Medications    Scheduled Meds: . amiodarone  400 mg Oral BID  . carvedilol  3.125 mg Oral BID WC  . heparin  5,000 Units Subcutaneous Q8H  . influenza vac split quadrivalent PF  0.5 mL Intramuscular Tomorrow-1000  . spironolactone  25 mg Oral Daily   Continuous Infusions:  PRN Meds: ALPRAZolam   Vital Signs    Vitals:   05/17/19 1751 05/17/19 2200 05/18/19 0626 05/18/19 0653  BP: 110/86 (!) 115/99 (!) 90/50 (!) 92/54  Pulse: (!) 107 95  95  Resp:      Temp:  98.2 F (36.8 C)  97.8 F (36.6 C)  TempSrc:  Oral  Oral  SpO2:  95%  93%  Weight:    118.9 kg  Height:        Intake/Output Summary (Last 24 hours) at 05/18/2019 0832 Last data filed at 05/17/2019 2200 Gross per 24 hour  Intake 310 ml  Output -  Net 310 ml   Last 3 Weights 05/18/2019 05/17/2019 10/09/2018  Weight (lbs) 262 lb 3.2 oz 262 lb 14.4 oz 267 lb  Weight (kg) 118.933 kg 119.251 kg 121.11 kg      Telemetry    SR, intermittent NSVT - Personally Reviewed  ECG    No new EKGs - Personally Reviewed  Physical Exam   GEN: Well nourished, well developed, in no acute distress  HEENT: normal  Neck: no JVD, carotid bruits, or masses Cardiac: RRR; no murmurs, rubs, or gallops,no edema  Respiratory:  clear to auscultation bilaterally, normal work of breathing GI: soft, nontender, nondistended, + BS MS: no deformity or atrophy  Skin: warm and dry, device site well healed Neuro:  Strength and sensation are intact Psych: euthymic mood, full affect   Labs    High Sensitivity Troponin:   Recent Labs  Lab 05/16/19 1031 05/16/19 1627 05/16/19 1932  TROPONINIHS 55* 41* 39*      Chemistry Recent Labs  Lab 05/17/19 0438 05/17/19 1329 05/18/19 0401  NA 130* 132* 134*  K 2.6* 2.9* 3.7  CL 93* 97* 99  CO2 24 23 25   GLUCOSE 166* 144* 131*   BUN 24* 20 16  CREATININE 1.16 1.16 1.00  CALCIUM 8.3* 7.9* 8.3*  GFRNONAA >60 >60 >60  GFRAA >60 >60 >60  ANIONGAP 13 12 10      Hematology Recent Labs  Lab 05/16/19 1031 05/16/19 1627  WBC 10.2 8.3  RBC 5.26 4.83  HGB 13.9 12.5*  HCT 45.0 41.3  MCV 85.6 85.5  MCH 26.4 25.9*  MCHC 30.9 30.3  RDW 23.4* 23.2*  PLT 301 296    BNPNo results for input(s): BNP, PROBNP in the last 168 hours.   DDimer No results for input(s): DDIMER in the last 168 hours.   Radiology    Dg Chest Port 1 View  Result Date: 05/16/2019 CLINICAL DATA:  Fatigue EXAM: PORTABLE CHEST 1 VIEW COMPARISON:  08/13/2016 FINDINGS: Cardiac shadow is mildly prominent slightly enlarged from the prior exam but accentuated by the portable technique. Defibrillator is again noted and stable. Minimal basilar atelectasis is seen. No effusion or pneumothorax is noted. No focal infiltrate is seen. IMPRESSION: Minimal bibasilar atelectasis.  No other focal abnormality is noted. Electronically Signed   By: 07/16/2019 M.D.   On: 05/16/2019 10:50    Cardiac Studies  09/27/2018; TTE IMPRESSIONS 1. The left ventricle has severely reduced systolic function, with an ejection fraction of 25-30%. The cavity size was mildly dilated. There is mildly increased left ventricular wall thickness. Left ventricular diastolic Doppler parameters are  consistent with impaired relaxation Left ventricular diffuse hypokinesis. 2. The right ventricle has normal systolic function. The cavity was normal. There is no increase in right ventricular wall thickness. 3. Left atrial size was mildly dilated. 4. Right atrial size was mildly dilated. 5. The mitral valve is normal in structure. No evidence of mitral valve stenosis. Mild regurgitation. 6. The tricuspid valve is normal in structure. 7. The aortic valve is tricuspid There is mild calcification of the aortic valve. No stenosis. 8. The pulmonic valve was normal in structure. 9. The  aortic root and ascending aorta are normal in size and structure. 10. Right atrial pressure is estimated at 3 mmHg. 11. PA systolic pressure 26 mmHg.   LHC 08/31/10 [facility], [place]: Right dominant. Left main free of disease, branches into LAD and cicrumflex. Large caliber vessels with no significant stenosis. No intimal irregularities in RCA. Elevated LVEDP at 40  Patient Profile     58 y.o. male with a hx of NICM, (clean cath in 2012), ? If genetic, w/ICD (implanted in [place]), initiallyLVAD and transplantwere considered but he did very well with improvment in EF, DM, seasonal asthma, essential tremor, chronic venous stasis, reports of h/o ETOH abuse, (pt denies any ETOH in years), referred to the ER after reports of syncope and device remote noted corresponding VF/therapies   Device information [employer] single chamber ICD implanted 10/06/2010 (in [place]) (as far as I can tell for primary prevention)  Interrogation (remote) Battery and lead impedence/sensing are good 05/15/2019: 01:18 ATPx1 failed > shock x1 successful, appropriate for PMVT/VF 16:51 shock x1 successful appropriate for VF 05/14/2019 07:08 ATP x2 failed, shock x1 successful, appropriate for PMVT / VF 03/16/2019 VT in monitor zone, no therapies (x8) VT w/ATPx1 failed, shock (x1) (no EGMs for immediate review)   Assessment & Plan    1. Syncope     Due to VF. Now on amiodarone. [name] need 400 mg BID for one week then 400 mg daily for 2 months then stop.  2. PMVT/VF     Continue coreg. Had low K. [name] discharge on 30 meq daily with a repeat BMP on Tuesday and HF appt next week with an APP. [name] stop entresto and start losartan 12.5 mg daily.  3. MSSA R shoulder infection Previously on antibiotics. No obvious signs of infection  4. AKI Has since normalized  For questions or updates, please contact [place] Please consult [email or web address] for contact info under     Signed, Justin Dutson Meredith Leeds, MD   05/18/2019, 8:32 AM

## 2019-05-18 NOTE — Progress Notes (Signed)
t

## 2019-05-19 ENCOUNTER — Telehealth: Payer: Self-pay | Admitting: Physician Assistant

## 2019-05-19 NOTE — Telephone Encounter (Addendum)
Patient went home from the hospital yesterday after VF arrest, started on amiodarone.  Baseline LFTs were obtained prior to discharge as outlined below - AST/ALT slightly elevated but have also been intermittently elevated in the past.  Hepatic Function Latest Ref Rng & Units 05/18/2019 09/09/2016 08/17/2016  Total Protein 6.5 - 8.1 g/dL 6.1(L) 6.2(L) 5.5(L)  Albumin 3.5 - 5.0 g/dL 3.1(L) 3.0(L) 2.7(L)  AST 15 - 41 U/L 74(H) 47(H) 115(H)  ALT 0 - 44 U/L 57(H) 28 51  Alk Phosphatase 38 - 126 U/L 85 64 115  Total Bilirubin 0.3 - 1.2 mg/dL 0.6 0.6 1.3(H)  Bilirubin, Direct 0.0 - 0.2 mg/dL 0.2 0.2 -   Will route to Dr. Lovena Le to be aware and advise if any specific changes necessary to plan for amio. Otherwise will keep EP and HF follow-up as planned. Dayna Dunn PA-C

## 2019-05-20 ENCOUNTER — Ambulatory Visit (INDEPENDENT_AMBULATORY_CARE_PROVIDER_SITE_OTHER): Payer: Self-pay | Admitting: *Deleted

## 2019-05-20 DIAGNOSIS — I472 Ventricular tachycardia, unspecified: Secondary | ICD-10-CM

## 2019-05-20 DIAGNOSIS — I428 Other cardiomyopathies: Secondary | ICD-10-CM

## 2019-05-21 ENCOUNTER — Other Ambulatory Visit: Payer: Self-pay

## 2019-05-21 ENCOUNTER — Telehealth (HOSPITAL_COMMUNITY): Payer: Self-pay | Admitting: Pharmacy Technician

## 2019-05-21 DIAGNOSIS — E876 Hypokalemia: Secondary | ICD-10-CM

## 2019-05-21 NOTE — Telephone Encounter (Signed)
Advanced Heart Failure Patient Advocate Encounter   Patient was approved to receive Entresto from [employer]  Patient ID: [id number] Effective dates: 05/20/2019 through 05/19/2020  Called to make patient aware of approval and to give him the phone number to [employer]. Left that information on voicemail.  Charlann Boxer, CPhT

## 2019-05-22 LAB — CUP PACEART REMOTE DEVICE CHECK
Battery Remaining Longevity: 48 mo
Battery Remaining Percentage: 52 %
Brady Statistic RV Percent Paced: 0 %
Date Time Interrogation Session: 20201006000200
HighPow Impedance: 62 Ohm
Implantable Lead Implant Date: 20120222
Implantable Lead Location: 753860
Implantable Lead Model: 296
Implantable Lead Serial Number: 100951
Implantable Pulse Generator Implant Date: 20120222
Lead Channel Impedance Value: 431 Ohm
Lead Channel Pacing Threshold Amplitude: 1.1 V
Lead Channel Pacing Threshold Pulse Width: 0.5 ms
Lead Channel Setting Pacing Amplitude: 2.5 V
Lead Channel Setting Pacing Pulse Width: 0.5 ms
Lead Channel Setting Sensing Sensitivity: 0.6 mV
Pulse Gen Serial Number: 100238

## 2019-05-22 LAB — BASIC METABOLIC PANEL
BUN/Creatinine Ratio: 16 (ref 9–20)
BUN: 17 mg/dL (ref 6–24)
CO2: 20 mmol/L (ref 20–29)
Calcium: 9.4 mg/dL (ref 8.7–10.2)
Chloride: 107 mmol/L — ABNORMAL HIGH (ref 96–106)
Creatinine, Ser: 1.09 mg/dL (ref 0.76–1.27)
GFR calc Af Amer: 87 mL/min/{1.73_m2} (ref >59)
GFR calc non Af Amer: 75 mL/min/{1.73_m2} (ref >59)
Glucose: 68 mg/dL (ref 65–99)
Potassium: 3.9 mmol/L (ref 3.5–5.2)
Sodium: 141 mmol/L (ref 134–144)

## 2019-05-22 LAB — MAGNESIUM: Magnesium: 1.7 mg/dL (ref 1.6–2.3)

## 2019-05-23 ENCOUNTER — Telehealth (HOSPITAL_COMMUNITY): Payer: Self-pay | Admitting: Licensed Clinical Social Worker

## 2019-05-23 ENCOUNTER — Telehealth: Payer: Self-pay | Admitting: *Deleted

## 2019-05-23 DIAGNOSIS — Z79899 Other long term (current) drug therapy: Secondary | ICD-10-CM

## 2019-05-23 MED ORDER — MAGNESIUM OXIDE -MG SUPPLEMENT 400 (240 MG) MG PO TABS: 400.0000 mg | ORAL_TABLET | Freq: Every day | ORAL | 11 refills | Status: DC

## 2019-05-23 MED ORDER — POTASSIUM CHLORIDE CRYS ER 20 MEQ PO TBCR: 40.0000 meq | EXTENDED_RELEASE_TABLET | Freq: Every day | ORAL | 3 refills | Status: DC

## 2019-05-23 NOTE — Telephone Encounter (Signed)
Call placed to pt re: lab results, See result note.

## 2019-05-23 NOTE — Addendum Note (Signed)
Addended by: Gaetano Net on: 05/23/2019 03:36 PM   Modules accepted: Orders

## 2019-05-23 NOTE — Telephone Encounter (Signed)
Pt sent CSW remaining documents for [facility] Assistance application- CSW mailed documents to financial department for review.  CSW will continue to follow and assist as needed  Jorge Ny, [facility] Desk#: [phone] Cell#: [phone]

## 2019-05-27 ENCOUNTER — Other Ambulatory Visit: Payer: Self-pay

## 2019-05-27 ENCOUNTER — Other Ambulatory Visit: Payer: Self-pay | Admitting: *Deleted

## 2019-05-27 ENCOUNTER — Ambulatory Visit (INDEPENDENT_AMBULATORY_CARE_PROVIDER_SITE_OTHER): Payer: Self-pay | Admitting: Student

## 2019-05-27 ENCOUNTER — Telehealth: Payer: Self-pay | Admitting: *Deleted

## 2019-05-27 VITALS — BP 122/84 | HR 100 | Ht 76.5 in | Wt 258.0 lb

## 2019-05-27 DIAGNOSIS — I428 Other cardiomyopathies: Secondary | ICD-10-CM

## 2019-05-27 DIAGNOSIS — I5022 Chronic systolic (congestive) heart failure: Secondary | ICD-10-CM

## 2019-05-27 DIAGNOSIS — Z79899 Other long term (current) drug therapy: Secondary | ICD-10-CM

## 2019-05-27 DIAGNOSIS — E876 Hypokalemia: Secondary | ICD-10-CM

## 2019-05-27 DIAGNOSIS — I472 Ventricular tachycardia, unspecified: Secondary | ICD-10-CM

## 2019-05-27 DIAGNOSIS — Z95810 Presence of automatic (implantable) cardiac defibrillator: Secondary | ICD-10-CM

## 2019-05-27 LAB — BASIC METABOLIC PANEL
BUN/Creatinine Ratio: 7 — ABNORMAL LOW (ref 9–20)
BUN: 7 mg/dL (ref 6–24)
CO2: 21 mmol/L (ref 20–29)
Calcium: 9.2 mg/dL (ref 8.7–10.2)
Chloride: 100 mmol/L (ref 96–106)
Creatinine, Ser: 1.03 mg/dL (ref 0.76–1.27)
GFR calc Af Amer: 93 mL/min/{1.73_m2} (ref >59)
GFR calc non Af Amer: 81 mL/min/{1.73_m2} (ref >59)
Glucose: 127 mg/dL — ABNORMAL HIGH (ref 65–99)
Potassium: 3.5 mmol/L (ref 3.5–5.2)
Sodium: 140 mmol/L (ref 134–144)

## 2019-05-27 LAB — CUP PACEART INCLINIC DEVICE CHECK
Date Time Interrogation Session: 20201012040000
HighPow Impedance: 62 Ohm
HighPow Impedance: 62 Ohm
HighPow Impedance: 73 Ohm
HighPow Impedance: 73 Ohm
Implantable Lead Implant Date: 20120222
Implantable Lead Location: 753860
Implantable Lead Model: 296
Implantable Lead Serial Number: 100951
Implantable Pulse Generator Implant Date: 20120222
Lead Channel Impedance Value: 474 Ohm
Lead Channel Impedance Value: 474 Ohm
Lead Channel Pacing Threshold Amplitude: 1.4 V
Lead Channel Pacing Threshold Amplitude: 1.4 V
Lead Channel Pacing Threshold Pulse Width: 0.5 ms
Lead Channel Pacing Threshold Pulse Width: 0.5 ms
Lead Channel Sensing Intrinsic Amplitude: 8.4 mV
Lead Channel Sensing Intrinsic Amplitude: 8.4 mV
Lead Channel Setting Pacing Amplitude: 2.5 V
Lead Channel Setting Pacing Pulse Width: 0.5 ms
Lead Channel Setting Sensing Sensitivity: 0.6 mV
Pulse Gen Serial Number: 100238

## 2019-05-27 LAB — MAGNESIUM: Magnesium: 1.6 mg/dL (ref 1.6–2.3)

## 2019-05-27 MED ORDER — AMIODARONE HCL 200 MG PO TABS: ORAL_TABLET | ORAL | 1 refills | Status: DC

## 2019-05-27 NOTE — Progress Notes (Signed)
Remote ICD transmission.   

## 2019-05-27 NOTE — Telephone Encounter (Signed)
Call placed to pt re: lab results, left a message for pt to call back.  

## 2019-05-27 NOTE — Telephone Encounter (Signed)
-----   Message from Charlie Pitter, [place] sent at 05/27/2019  3:41 PM EDT ----- These labs are to f/u recent result note. Please verify that pt is taking KCl and MagOx as outlined in chart. If so, would increase KCl to 77meq QAM 20mg  QPM and increase MagOx to 400mg  BID. Keep f/u Dr. Aundra Dubin 10/20 at which time I anticipate repeat labs will be checked to ensure lytes at goal, will cc to him to add to visit that day. Dayna Dunn PA-C

## 2019-05-27 NOTE — Patient Instructions (Signed)
Medication Instructions:  Your physician recommends that you continue on your current medications as directed. Please refer to the Current Medication list given to you today.   If you need a refill on your cardiac medications before your next appointment, please call your pharmacy.   Lab work: AS ORDERED ALREADY   If you have labs (blood work) drawn today and your tests are completely normal, you will receive your results only by: Marland Kitchen MyChart Message (if you have MyChart) OR . A paper copy in the mail If you have any lab test that is abnormal or we need to change your treatment, we will call you to review the results.  Testing/Procedures: NONE ORDERED  TODAY    Follow-Up: At [facility], you and your health needs are our priority.  As part of our continuing mission to provide you with exceptional heart care, we have created designated Provider Care Teams.  These Care Teams include your primary Cardiologist (physician) and Advanced Practice Providers (APPs -  Physician Assistants and Nurse Practitioners) who all work together to provide you with the care you need, when you need it. . You will need a follow up appointment in 2 months. Joesph July PA-C   Any Other Special Instructions Will Be Listed Below (If Applicable).

## 2019-05-27 NOTE — Progress Notes (Signed)
Electrophysiology Office Note Date: 05/27/2019  ID:  Justin Bernard, DOB 1961-05-01, MRN [id number]  PCP: Marda Stalker, PA-C Primary Cardiologist: Loralie Champagne, MD Electrophysiologist: Cristopher Peru, MD  CC: Routine ICD follow-up  Justin Bernard is a 58 y.o. male seen today for Dr Curt Bears. He has a PMH of chronic systolic CHF s/p ICD, NICM, AKI with recent VF arrest in setting of hypokalemia. He presents today for post hospital follow up.   He is feeling well overall. He has had no further ICD shocks (or VT by interrogation). Episode of NSVT noted on interrogation also seen in hospital. He denies chest pain, palpitations, dyspnea, PND, orthopnea, nausea, vomiting, dizziness, syncope, edema, weight gain, or early satiety.  He has not had any further ICD shocks.   He plans to follow up with Dr. Marcelino Scot about his chronic R shoulder infection.   Device History: Engineer, agricultural ICD implanted 09/2010 (in [place]) for Chronic systolic CHF History of appropriate therapy: Yes History of AAD therapy: Yes   Past Medical History:  Diagnosis Date  . AICD (automatic cardioverter/defibrillator) present   . Arthritis    L hip   . Asthma    pt. uses albuterol inhaler in the spring for enviromental allergies   . Cataract    LEFT EYE  . Chronic systolic heart failure (HCC)    NICM, previous heart transplant candidate with EF 10%  . Diabetes mellitus without complication ([place])    TYPE 2  . Family history of adverse reaction to anesthesia    sister- headache & N&V am and mother  . History of hiatal hernia   . History of kidney stones   . Hypertension   . Hypokalemia   . Neuromuscular disorder ([place])    essential tremor  . Osteomyelitis ([place])    a. MSSA osteomyelitis of the right shoulder.  . Ventricular fibrillation ([place])   . Ventricular tachycardia [facility])    Past Surgical History:  Procedure Laterality Date  . BACK SURGERY  07/2000   fusion , [facility]  . BONE EXCISION Right 04/29/2016   Procedure: EXCISION HETEROTOPIC BONE RIGHT HUMERUS;  Surgeon: Altamese Wooster, MD;  Location: [place];  Service: Orthopedics;  Laterality: Right;  . CARDIAC CATHETERIZATION  08/2012  . CYSTOSCOPY/URETEROSCOPY/HOLMIUM LASER/STENT PLACEMENT Bilateral 04/03/2017   Procedure: CYSTOSCOPY/URETEROSCOPY/HOLMIUM LASER/LEFT STENT REPLACEMENT AND RIGHT   STENT PLACEMENT;  Surgeon: Nickie Retort, MD;  Location: [facility];  Service: Urology;  Laterality: Bilateral;  . FRACTURE SURGERY Left    hip , r shoulder   . HARDWARE REMOVAL Right 04/29/2016   Procedure: HARDWARE REMOVAL;  Surgeon: Altamese Tuscola, MD;  Location: [place];  Service: Orthopedics;  Laterality: Right;  . HERNIA REPAIR Right    inguinal   . HIP SURGERY Left YRS AGO   hardware present, for slipped joint   . HOLMIUM LASER APPLICATION Bilateral 7/41/6384   Procedure: HOLMIUM LASER APPLICATION;  Surgeon: Nickie Retort, MD;  Location: [facility];  Service: Urology;  Laterality: Bilateral;  . ICD  2012   [employer]  . IR URETERAL STENT LEFT NEW ACCESS W/O SEP NEPHROSTOMY CATH  02/20/2017  . NEPHROLITHOTOMY Left 02/20/2017   Procedure: NEPHROLITHOTOMY PERCUTANEOUS;  Surgeon: Nickie Retort, MD;  Location: [facility];  Service: Urology;  Laterality: Left;  . ORIF HUMERUS FRACTURE Right 07/20/2015   Procedure: OPEN REDUCTION INTERNAL FIXATION (ORIF) PROXIMAL HUMERUS FRACTURE;  Surgeon: Netta Cedars, MD;  Location: [place];  Service: Orthopedics;  Laterality: Right;  . TONSILLECTOMY  AS CHILD  . ulnar surgery Right     Current Outpatient Medications  Medication Sig Dispense Refill  . albuterol (PROVENTIL HFA;VENTOLIN HFA) 108 (90 Base) MCG/ACT inhaler Inhale 2 puffs into the lungs every 6 (six) hours as needed for wheezing or shortness of breath.    Marland Kitchen amiodarone (PACERONE) 200 MG tablet Take 2 tablets (400 mg) twice a day for 1 week, then reduce to 2 tablets ('400mg'$ ) once daily. 70 tablet 1  .  carvedilol (COREG) 3.125 MG tablet Take 1 tablet (3.125 mg total) by mouth 2 (two) times daily with a meal. 60 tablet 6  . glipiZIDE (GLUCOTROL XL) 5 MG 24 hr tablet Take 5 mg by mouth daily with breakfast.     . glucose monitoring kit (FREESTYLE) monitoring kit 1 each by Does not apply route 4 (four) times daily - after meals and at bedtime. 1 month Diabetic Testing Supplies for QAC-QHS accuchecks. (Patient not taking: Reported on 05/16/2019) 1 each 1  . hydrocortisone cream 1 % Apply 1 application topically 2 (two) times daily as needed for itching.    . Magnesium Oxide 400 (240 Mg) MG TABS Take 1 tablet (400 mg total) by mouth daily. 30 tablet 11  . Multiple Vitamin (MULTIVITAMIN WITH MINERALS) TABS tablet Take 1 tablet by mouth daily. (Patient not taking: Reported on 05/16/2019) 30 tablet 0  . potassium chloride SA (KLOR-CON) 20 MEQ tablet Take 2 tablets (40 mEq total) by mouth daily. 60 tablet 3  . spironolactone (ALDACTONE) 25 MG tablet Take 1 tablet (25 mg total) by mouth daily. 30 tablet 6   No current facility-administered medications for this visit.     Allergies:   Penicillins   Social History: Social History   Socioeconomic History  . Marital status: Divorced    Spouse name: Not on file  . Number of children: Not on file  . Years of education: Not on file  . Highest education level: Not on file  Occupational History  . Not on file  Social Needs  . Financial resource strain: Not on file  . Food insecurity    Worry: Not on file    Inability: Not on file  . Transportation needs    Medical: Not on file    Non-medical: Not on file  Tobacco Use  . Smoking status: Never Smoker  . Smokeless tobacco: Never Used  Substance and Sexual Activity  . Alcohol use: No  . Drug use: No  . Sexual activity: Not Currently  Lifestyle  . Physical activity    Days per week: Not on file    Minutes per session: Not on file  . Stress: Not on file  Relationships  . Social Product manager on phone: Not on file    Gets together: Not on file    Attends religious service: Not on file    Active member of club or organization: Not on file    Attends meetings of clubs or organizations: Not on file    Relationship status: Not on file  . Intimate partner violence    Fear of current or ex partner: Not on file    Emotionally abused: Not on file    Physically abused: Not on file    Forced sexual activity: Not on file  Other Topics Concern  . Not on file  Social History Narrative  . Not on file    Family History: Family History  Problem Relation Age  of Onset  . Heart failure Mother   . Hypertension Mother   . CAD Father   . Hypertension Other   . CAD Other     Review of Systems: All other systems reviewed and are otherwise negative except as noted above.   Physical Exam: There were no vitals filed for this visit.   GEN- The patient is well appearing, alert and oriented x 3 today.   HEENT: normocephalic, atraumatic; sclera clear, conjunctiva pink; hearing intact; oropharynx clear; neck supple, no JVP Lymph- no cervical lymphadenopathy Lungs- Clear to ausculation bilaterally, normal work of breathing.  No wheezes, rales, rhonchi Heart- Regular rate and rhythm, no murmurs, rubs or gallops, PMI not laterally displaced GI- soft, non-tender, non-distended, bowel sounds present, no hepatosplenomegaly Extremities- no clubbing, cyanosis, or edema; DP/PT/radial pulses 2+ bilaterally MS- no significant deformity or atrophy Skin- warm and dry, no rash or lesion; ICD pocket well healed Psych- euthymic mood, full affect Neuro- strength and sensation are intact  ICD interrogation- reviewed in detail today,  See PACEART report  EKG:  EKG is not ordered today. The ekg ordered 05/17/2019 shows sinus tach 101 bpm, QRS 130 ms, PR 165 ms.  Recent Labs: 09/24/2018: B Natriuretic Peptide 62.4 05/16/2019: Hemoglobin 12.5; Platelets 296 05/18/2019: ALT 57; TSH 3.460 05/21/2019: BUN  17; Creatinine, Ser 1.09; Magnesium 1.7; Potassium 3.9; Sodium 141   Wt Readings from Last 3 Encounters:  05/18/19 262 lb 3.2 oz (118.9 kg)  10/09/18 267 lb (121.1 kg)  09/24/18 277 lb 9.6 oz (125.9 kg)     Other studies Reviewed: Additional studies/ records that were reviewed today include: Echo 05/16/2019 LVEF 40-45%, Most recent labwork, hospital discharge and HF/EP notes.    Assessment and Plan:  1.  Chronic systolic dysfunction, NICM Volume status stable on exam.  Stable on an appropriate medical regimen Normal ICD function See Pace Art report No changes today. He has pending labwork and follow up with CHF next week.   2. PMVT/VF Decrease amiodarone to 400 mg daily as planned.  Would consider stopping in ~ 2 months if no further VT. I will see him back at this time to discuss/assess further.   3. MSSA R shoulder infection No obvious signs of infection at this time, but encouraged follow up. This would put him at a high risk for device complication He has pending follow up with Dr. Marcelino Scot.   4. Hypokalemia Labs today.  5. Recent AKi F/u today.    Current medicines are reviewed at length with the patient today.   The patient does not have concerns regarding his medicines.  The following changes were made today:  Amiodarone decreased to daily as previously planned.   Labs/ tests ordered today include:  BMET and Mg ordered previously.    Disposition:   Follow up with me in 2 months to consider stopping Amiodarone.   Jacalyn Lefevre, PA-C  05/27/2019 10:40 AM  [facility] [address] [place] [address] [phone] (office) [phone] (fax)

## 2019-05-30 NOTE — Telephone Encounter (Signed)
2nd attempt to reach pt re: lab results, left another message for hm to call back.

## 2019-05-30 NOTE — Telephone Encounter (Signed)
-----   Message from Dayna N Dunn, PA-C sent at 05/27/2019  3:41 PM EDT ----- These labs are to f/u recent result note. Please verify that pt is taking KCl and MagOx as outlined in chart. If so, would increase KCl to 40meq QAM 20mg QPM and increase MagOx to 400mg BID. Keep f/u Dr. McLean 10/20 at which time I anticipate repeat labs will be checked to ensure lytes at goal, will cc to him to add to visit that day. Dayna Dunn PA-C  

## 2019-05-31 MED ORDER — MAGNESIUM OXIDE -MG SUPPLEMENT 400 (240 MG) MG PO TABS: 1.0000 | ORAL_TABLET | Freq: Two times a day (BID) | ORAL | 2 refills | Status: DC

## 2019-05-31 MED ORDER — POTASSIUM CHLORIDE CRYS ER 20 MEQ PO TBCR: EXTENDED_RELEASE_TABLET | ORAL | 3 refills | Status: AC

## 2019-06-02 ENCOUNTER — Other Ambulatory Visit: Payer: Self-pay

## 2019-06-02 ENCOUNTER — Telehealth: Payer: Self-pay | Admitting: Cardiology

## 2019-06-02 ENCOUNTER — Emergency Department (HOSPITAL_COMMUNITY): Payer: Medicaid Other

## 2019-06-02 ENCOUNTER — Emergency Department (HOSPITAL_COMMUNITY)
Admission: EM | Admit: 2019-06-02 | Discharge: 2019-06-02 | Disposition: A | Discharge status: Home / Self Care | Payer: Medicaid Other | Attending: Emergency Medicine | Admitting: Emergency Medicine

## 2019-06-02 DIAGNOSIS — Z7984 Long term (current) use of oral hypoglycemic drugs: Secondary | ICD-10-CM | POA: Insufficient documentation

## 2019-06-02 DIAGNOSIS — I110 Hypertensive heart disease with heart failure: Secondary | ICD-10-CM | POA: Insufficient documentation

## 2019-06-02 DIAGNOSIS — Z79899 Other long term (current) drug therapy: Secondary | ICD-10-CM | POA: Insufficient documentation

## 2019-06-02 DIAGNOSIS — R0602 Shortness of breath: Secondary | ICD-10-CM | POA: Insufficient documentation

## 2019-06-02 DIAGNOSIS — R531 Weakness: Secondary | ICD-10-CM

## 2019-06-02 DIAGNOSIS — Y9389 Activity, other specified: Secondary | ICD-10-CM | POA: Insufficient documentation

## 2019-06-02 DIAGNOSIS — M6281 Muscle weakness (generalized): Secondary | ICD-10-CM | POA: Insufficient documentation

## 2019-06-02 DIAGNOSIS — E119 Type 2 diabetes mellitus without complications: Secondary | ICD-10-CM | POA: Insufficient documentation

## 2019-06-02 DIAGNOSIS — Y9289 Other specified places as the place of occurrence of the external cause: Secondary | ICD-10-CM | POA: Insufficient documentation

## 2019-06-02 DIAGNOSIS — W06XXXA Fall from bed, initial encounter: Secondary | ICD-10-CM | POA: Insufficient documentation

## 2019-06-02 DIAGNOSIS — S41001A Unspecified open wound of right shoulder, initial encounter: Secondary | ICD-10-CM | POA: Insufficient documentation

## 2019-06-02 DIAGNOSIS — Y999 Unspecified external cause status: Secondary | ICD-10-CM | POA: Insufficient documentation

## 2019-06-02 DIAGNOSIS — I5022 Chronic systolic (congestive) heart failure: Secondary | ICD-10-CM | POA: Insufficient documentation

## 2019-06-02 DIAGNOSIS — S80211A Abrasion, right knee, initial encounter: Secondary | ICD-10-CM | POA: Insufficient documentation

## 2019-06-02 DIAGNOSIS — W19XXXA Unspecified fall, initial encounter: Secondary | ICD-10-CM

## 2019-06-02 LAB — BASIC METABOLIC PANEL
Anion gap: 15 (ref 5–15)
BUN: 10 mg/dL (ref 6–20)
CO2: 16 mmol/L — ABNORMAL LOW (ref 22–32)
Calcium: 9.2 mg/dL (ref 8.9–10.3)
Chloride: 109 mmol/L (ref 98–111)
Creatinine, Ser: 1.23 mg/dL (ref 0.61–1.24)
GFR calc Af Amer: >60 mL/min (ref >60)
GFR calc non Af Amer: >60 mL/min (ref >60)
Glucose, Bld: 126 mg/dL — ABNORMAL HIGH (ref 70–99)
Potassium: 5.2 mmol/L — ABNORMAL HIGH (ref 3.5–5.1)
Sodium: 140 mmol/L (ref 135–145)

## 2019-06-02 LAB — TROPONIN I (HIGH SENSITIVITY)
Troponin I (High Sensitivity): 9 ng/L (ref <18)
Troponin I (High Sensitivity): 9 ng/L (ref <18)

## 2019-06-02 LAB — CBC
HCT: 45.8 % (ref 39.0–52.0)
Hemoglobin: 13.6 g/dL (ref 13.0–17.0)
MCH: 26.5 pg (ref 26.0–34.0)
MCHC: 29.7 g/dL — ABNORMAL LOW (ref 30.0–36.0)
MCV: 89.3 fL (ref 80.0–100.0)
Platelets: 487 10*3/uL — ABNORMAL HIGH (ref 150–400)
RBC: 5.13 MIL/uL (ref 4.22–5.81)
RDW: 21.6 % — ABNORMAL HIGH (ref 11.5–15.5)
WBC: 10.7 10*3/uL — ABNORMAL HIGH (ref 4.0–10.5)
nRBC: 0 % (ref 0.0–0.2)

## 2019-06-02 LAB — CBG MONITORING, ED: Glucose-Capillary: 113 mg/dL — ABNORMAL HIGH (ref 70–99)

## 2019-06-02 LAB — MAGNESIUM: Magnesium: 1.9 mg/dL (ref 1.7–2.4)

## 2019-06-02 MED ORDER — SODIUM CHLORIDE 0.9% FLUSH: 3.0000 mL | Freq: Once | INTRAVENOUS | Status: DC

## 2019-06-02 NOTE — ED Triage Notes (Signed)
Pt reports waking up today with low BP, dizziness and slipped out of bed. Reports he was here for 2 weeks and his defibrillator fired 3 times. Pt unsure if it went off today. Sent by cards.

## 2019-06-02 NOTE — ED Notes (Signed)
Patient verbalized understanding of discharge instructions. Opportunities for questioning and answers were provided. Armband removed by staff. Patient removed from ED.   

## 2019-06-02 NOTE — Discharge Instructions (Signed)
You have been diagnosed today with generalized weakness and fall.  At this time there does not appear to be the presence of an emergent medical condition, however there is always the potential for conditions to change. Please read and follow the below instructions.  Please return to the Emergency Department immediately for any new or worsening symptoms or if your symptoms return. Please be sure to follow up with your Primary Care Provider within one week regarding your visit today; please call their office to schedule an appointment even if you are feeling better for a follow-up visit. Please go to your cardiology appointment on Tuesday for follow-up. Please follow-up with your orthopedist Dr. Marcelino Scot regarding your right shoulder pain and infection.  Get help right away if you: Have sudden weakness on one side of your face or body. Have chest pain. Have trouble breathing or shortness of breath. Have problems with your vision. Have trouble talking or swallowing. Have trouble standing or walking. Are light-headed. Pass out (lose consciousness). You have any new/concerning or worsening of symptoms  Please read the additional information packets attached to your discharge summary.  Do not take your medicine if  develop an itchy rash, swelling in your mouth or lips, or difficulty breathing; call 911 and seek immediate emergency medical attention if this occurs.  Note: Portions of this text may have been transcribed using voice recognition software. Every effort was made to ensure accuracy; however, inadvertent computerized transcription errors may still be present.

## 2019-06-02 NOTE — ED Notes (Signed)
ICD interrogated by [facility] scientific rep. Nothing abnormal noted on the report and patient is sinus rhythm.

## 2019-06-02 NOTE — ED Provider Notes (Signed)
[facility] EMERGENCY DEPARTMENT Provider Note   CSN: [id number] Arrival date & time: 06/02/19  1409     History   Chief Complaint Chief Complaint  Patient presents with  . Fall    HPI Justin Bernard is a 58 y.o. male.  Patient woke up at 7 AM, upon getting out of bed became light headed.  Reported anxiety and short of breath, subsequently slid to the floor scraping his right knee on the ground.  Patient reports that he landed on his right shoulder and has had a mild aching pain of this area constant worsened with movement and improved with rest.  Denies head injury or loss of consciousness.  He denies any associated chest pain.  He reports similar event occurred October 1 at which time it was found that his defibrillator had discharged 3 time and he was admitted to the hospital.  Patient is unsure whether his defibrillator fired today but reports that he did not feel the defibrillator fire during his last admission either.  Upon arrival patient reports that he is feeling improved he reports generalized weakness and fatigue.  Denies any recent illness, fever/chills, headache/vision changes, head injury, neck pain, chest pain/shortness of breath, abdominal pain, nausea/vomiting, diarrhea, dysuria, swelling or additional concerns.  Of note patient with abrasion to the right knee, denies pain reports area is not bothering him.  Patient reports acute on chronic right shoulder pain he has draining wound to the anterior shoulder.  Patient reports Tdap up-to-date within 3 years.    HPI  Past Medical History:  Diagnosis Date  . AICD (automatic cardioverter/defibrillator) present   . Arthritis    L hip   . Asthma    pt. uses albuterol inhaler in the spring for enviromental allergies   . Cataract    LEFT EYE  . Chronic systolic heart failure (HCC)    NICM, previous heart transplant candidate with EF 10%  . Diabetes mellitus without complication ([place])    TYPE 2   . Family history of adverse reaction to anesthesia    sister- headache & N&V am and mother  . History of hiatal hernia   . History of kidney stones   . Hypertension   . Hypokalemia   . Neuromuscular disorder ([place])    essential tremor  . Osteomyelitis ([place])    a. MSSA osteomyelitis of the right shoulder.  . Ventricular fibrillation ([place])   . Ventricular tachycardia [facility])     Patient Active Problem List   Diagnosis Date Noted  . Ventricular tachyarrhythmia ([place]) 05/18/2019  . NICM (nonischemic cardiomyopathy) ([place]) 05/18/2019  . AKI (acute kidney injury) ([place]) 05/18/2019  . Syncope and collapse 05/18/2019  . ICD (implantable cardioverter-defibrillator) discharge 05/16/2019  . Infection of shoulder ([place]) 05/29/2018  . Nephrolithiasis 02/20/2017  . ETOH abuse 09/22/2016  . Orthostatic hypotension 09/10/2016  . Urinary retention 09/10/2016  . Chronic venous stasis dermatitis 09/08/2016  . Diabetes mellitus type II, non insulin dependent ([place]) 08/13/2016  . Painful orthopaedic hardware ([place]) 04/29/2016  . S/P hardware removal 04/29/2016  . ICD (implantable cardioverter-defibrillator) in place 08/04/2015  . Hypokalemia 07/17/2015  . Chronic systolic CHF (congestive heart failure), NYHA class 2 ([place]) 07/17/2015  . Fracture closed, humerus 07/17/2015    Past Surgical History:  Procedure Laterality Date  . BACK SURGERY  07/2000   fusion , [facility]  . BONE EXCISION Right 04/29/2016   Procedure: EXCISION HETEROTOPIC BONE RIGHT HUMERUS;  Surgeon: Altamese New Point, MD;  Location: [place];  Service: Orthopedics;  Laterality: Right;  . CARDIAC CATHETERIZATION  08/2012  . CYSTOSCOPY/URETEROSCOPY/HOLMIUM LASER/STENT PLACEMENT Bilateral 04/03/2017   Procedure: CYSTOSCOPY/URETEROSCOPY/HOLMIUM LASER/LEFT STENT REPLACEMENT AND RIGHT   STENT PLACEMENT;  Surgeon: Nickie Retort, MD;  Location: [facility];  Service: Urology;  Laterality: Bilateral;  . FRACTURE SURGERY Left    hip , r  shoulder   . HARDWARE REMOVAL Right 04/29/2016   Procedure: HARDWARE REMOVAL;  Surgeon: Altamese Goshen, MD;  Location: [place];  Service: Orthopedics;  Laterality: Right;  . HERNIA REPAIR Right    inguinal   . HIP SURGERY Left YRS AGO   hardware present, for slipped joint   . HOLMIUM LASER APPLICATION Bilateral 9/35/7017   Procedure: HOLMIUM LASER APPLICATION;  Surgeon: Nickie Retort, MD;  Location: [facility];  Service: Urology;  Laterality: Bilateral;  . ICD  2012   [employer]  . IR URETERAL STENT LEFT NEW ACCESS W/O SEP NEPHROSTOMY CATH  02/20/2017  . NEPHROLITHOTOMY Left 02/20/2017   Procedure: NEPHROLITHOTOMY PERCUTANEOUS;  Surgeon: Nickie Retort, MD;  Location[facility];  Service: Urology;  Laterality: Left;  . ORIF HUMERUS FRACTURE Right 07/20/2015   Procedure: OPEN REDUCTION INTERNAL FIXATION (ORIF) PROXIMAL HUMERUS FRACTURE;  Surgeon: Netta Cedars, MD;  Location: [place];  Service: Orthopedics;  Laterality: Right;  . TONSILLECTOMY  AS CHILD  . ulnar surgery Right         Home Medications    Prior to Admission medications   Medication Sig Start Date End Date Taking? Authorizing Provider  albuterol (PROVENTIL HFA;VENTOLIN HFA) 108 (90 Base) MCG/ACT inhaler Inhale 2 puffs into the lungs every 6 (six) hours as needed for wheezing or shortness of breath.    [provider]  amiodarone (PACERONE) 200 MG tablet Take 2 tablets (400 mg)) once daily. 05/27/19   [name] Friar, PA-C  carvedilol (COREG) 3.125 MG tablet Take 1 tablet (3.125 mg total) by mouth 2 (two) times daily with a meal. 05/18/19   Dunn, Dayna N, PA-C  glipiZIDE (GLUCOTROL XL) 5 MG 24 hr tablet Take 5 mg by mouth daily with breakfast.  04/24/16   [provider]  glucose monitoring kit (FREESTYLE) monitoring kit 1 each by Does not apply route 4 (four) times daily - after meals and at bedtime. 1 month Diabetic Testing Supplies for QAC-QHS accuchecks. 07/23/15   Ghimire, Henreitta Leber, MD   hydrocortisone cream 1 % Apply 1 application topically 2 (two) times daily as needed for itching.    [provider]  Magnesium Oxide 400 (240 Mg) MG TABS Take 1 tablet (400 mg total) by mouth 2 (two) times daily. 05/31/19   Dunn, Nedra Hai, PA-C  Multiple Vitamin (MULTIVITAMIN WITH MINERALS) TABS tablet Take 1 tablet by mouth daily. 08/22/16   Elgergawy, Silver Huguenin, MD  potassium chloride SA (KLOR-CON) 20 MEQ tablet Take 2 tablets by mouth in the a.m,, take 1 tablet by mouth in the afternoon 05/31/19   Charlie Pitter, PA-C  spironolactone (ALDACTONE) 25 MG tablet Take 1 tablet (25 mg total) by mouth daily. 05/19/19   Charlie Pitter, PA-C    Family History Family History  Problem Relation Age of Onset  . Heart failure Mother   . Hypertension Mother   . CAD Father   . Hypertension Other   . CAD Other     Social History Social History   Tobacco Use  . Smoking status: Never Smoker  . Smokeless tobacco: Never Used  Substance  Use Topics  . Alcohol use: No  . Drug use: No     Allergies   Penicillins   Review of Systems Review of Systems Ten systems are reviewed and are negative for acute change except as noted in the HPI   Physical Exam Updated Vital Signs BP 107/87   Pulse 87   Temp 98.5 F (36.9 C) (Oral)   Resp (!) 24   Ht 6' 4.5" (1.943 m)   Wt 117 kg   SpO2 95%   BMI 31.00 kg/m   Physical Exam Constitutional:      General: He is not in acute distress.    Appearance: Normal appearance. He is well-developed. He is not ill-appearing or diaphoretic.  HENT:     Head: Normocephalic and atraumatic. No raccoon eyes, Battle's sign or contusion.     Jaw: There is normal jaw occlusion. No trismus.     Right Ear: External ear normal. No hemotympanum.     Left Ear: External ear normal. No hemotympanum.     Nose: Nose normal. No rhinorrhea.     Right Nostril: No epistaxis.     Left Nostril: No epistaxis.     Mouth/Throat:     Mouth: Mucous membranes are moist.      Pharynx: Oropharynx is clear.  Eyes:     General: Vision grossly intact. Gaze aligned appropriately.     Pupils: Pupils are equal, round, and reactive to light.  Neck:     Musculoskeletal: Normal range of motion.     Trachea: Trachea and phonation normal. No tracheal deviation.  Cardiovascular:     Rate and Rhythm: Normal rate and regular rhythm.     Pulses:          Dorsalis pedis pulses are 2+ on the right side and 2+ on the left side.     Heart sounds: Normal heart sounds.  Pulmonary:     Effort: Pulmonary effort is normal. No accessory muscle usage or respiratory distress.     Breath sounds: Normal breath sounds and air entry.  Abdominal:     General: There is no distension.     Palpations: Abdomen is soft.     Tenderness: There is no abdominal tenderness. There is no guarding or rebound.  Musculoskeletal: Normal range of motion.  Skin:    General: Skin is warm and dry.     Capillary Refill: Capillary refill takes less than 2 seconds.          Comments: Draining wound of the anterior right shoulder patient reports is chronic and without change. - 2 cm abrasion of right knee.  No pain with movement.  No signs of infection.  Antibiotic ointment per patient's wife.  Neurological:     Mental Status: He is alert.     GCS: GCS eye subscore is 4. GCS verbal subscore is 5. GCS motor subscore is 6.     Comments: Speech is clear and goal oriented, follows commands Major Cranial nerves without deficit, no facial droop Moves extremities without ataxia, coordination intact  Psychiatric:        Behavior: Behavior normal.      ED Treatments / Results  Labs (all labs ordered are listed, but only abnormal results are displayed) Labs Reviewed  BASIC METABOLIC PANEL - Abnormal; Notable for the following components:      Result Value   Potassium 5.2 (*)    CO2 16 (*)    Glucose, Bld 126 (*)    All other  components within normal limits  CBC - Abnormal; Notable for the following  components:   WBC 10.7 (*)    MCHC 29.7 (*)    RDW 21.6 (*)    Platelets 487 (*)    All other components within normal limits  CBG MONITORING, ED - Abnormal; Notable for the following components:   Glucose-Capillary 113 (*)    All other components within normal limits  MAGNESIUM  URINALYSIS, ROUTINE W REFLEX MICROSCOPIC  CBG MONITORING, ED  TROPONIN I (HIGH SENSITIVITY)  TROPONIN I (HIGH SENSITIVITY)    EKG EKG Interpretation  Date/Time:  Sunday June 02 2019 14:14:18 EDT Ventricular Rate:  110 PR Interval:    QRS Duration: 110 QT Interval:  352 QTC Calculation: 476 R Axis:   26 Text Interpretation:  Sinus tachycardia Cannot rule out Anterior infarct , age undetermined No significant change since last tracing Confirmed by Virgel Manifold [phone]) on 06/02/2019 3:56:18 PM   Radiology Dg Chest Portable 1 View  Result Date: 06/02/2019 CLINICAL DATA:  58 year old male with history of trauma from a fall. EXAM: PORTABLE CHEST 1 VIEW COMPARISON:  Chest x-ray 05/16/2019. FINDINGS: Low lung volumes. Linear subsegmental atelectasis at the right lung base, new compared to the prior study. No acute consolidative airspace disease. No pleural effusions. Cephalization of the pulmonary vasculature, without frank pulmonary edema. Mild cardiomegaly. Upper mediastinal contours are within normal limits. Left-sided pacemaker/AICD with lead tip projecting over the expected location of the right ventricle. IMPRESSION: 1. Low lung volumes with subsegmental atelectasis in the right lung base. 2. Cardiomegaly with pulmonary venous congestion, but no frank pulmonary edema. Electronically Signed   By: Vinnie Langton M.D.   On: 06/02/2019 16:32   Dg Shoulder Right Portable  Result Date: 06/02/2019 CLINICAL DATA:  Anterior right shoulder pain after fall EXAM: PORTABLE RIGHT SHOULDER COMPARISON:  04/29/2016, 06/06/2018 FINDINGS: Chronic posttraumatic deformity of the proximal right humerus secondary to  remote prior fracture and subsequent hardware removal. No discrete acute fracture component is evident. Glenohumeral joint alignment is maintained without dislocation. The [facility] joint is intact. No acute soft tissue findings. IMPRESSION: Chronic posttraumatic deformity of the proximal right humerus without evidence to suggest new fracture or dislocation. Electronically Signed   By: Davina Poke M.D.   On: 06/02/2019 16:34    Procedures Procedures (including critical care time)  Medications Ordered in ED Medications  sodium chloride flush (NS) 0.9 % injection 3 mL (has no administration in time range)     Initial Impression / Assessment and Plan / ED Course  I have reviewed the triage vital signs and the nursing notes.  Pertinent labs & imaging results that were available during my care of the patient were reviewed by me and considered in my medical decision making (see chart for details).    Initial and delta high-sensitivity troponin: 9 CBC nonacute Magnesium 1.9 BMP with potassium 5.2, nonacute CBG 113 Urinalysis pending EKG:  Sinus tachycardia Cannot rule out Anterior infarct , age undetermined No significant change since last tracing Confirmed by Virgel Manifold [phone]) on 06/02/2019 3:56:18 PM  CXR:  IMPRESSION:  1. Low lung volumes with subsegmental atelectasis in the right lung  base.  2. Cardiomegaly with pulmonary venous congestion, but no frank  pulmonary edema.   DG Right Shoulder:  IMPRESSION:  Chronic posttraumatic deformity of the proximal right humerus  without evidence to suggest new fracture or dislocation.  - Patient reevaluated multiple times resting comfortably no acute distress, no recurrence of symptoms.  He is  chronically ill-appearing but overall appears comfortable and in no acute distress.  Smiling and talking with his wife.  There have been multiple attempts to interrogate patient's device without success.  Device representative contacted and is in  route to hospital.  Plan of care is to contact cardiology for their input after device was interrogated. - 8:15 PM: Per RN Jimmye Norman, [facility] Scientific representative reports that there is been no abnormalities. - 8:55 PM: Patient reassessment resting comfortably no acute distress states no recurrence of symptoms since 7 AM this morning.  Reports that he has been feeling well since that time and has no complaints.  He is requesting to go home at this time.  I had a discussion with patient and his wife at bedside about his multiple comorbidities and their plans.  For his right shoulder infection he has a plan for surgical repair with Dr. Ginette Pitman in the next few weeks he is currently not on antibiotics but has been intermittently in the past, he is afebrile not tachycardic and does not appear to be systemically ill, do not feel indication for antibiotics at this time as it is being monitored by orthopedics already.  As to patient's weakness and shortness of breath falling out of bed this morning no recurrence of symptoms they have a follow-up appointment with cardiology on Tuesday morning and would prefer to be discharged to follow-up with them.  Feel this is reasonable at this time.  Patient encouraged to return to the emergency department immediately for recurrence of symptoms or for any new problems.  Patient and wife state understanding.  At this time there does not appear to be any evidence of an acute emergency medical condition and the patient appears stable for discharge with appropriate outpatient follow up. Diagnosis was discussed with patient who verbalizes understanding of care plan and is agreeable to discharge. I have discussed return precautions with patient and wife who verbalizes understanding of return precautions. Patient encouraged to follow-up with their PCP, cardiology, orthopedist. All questions answered.  Patient has been discharged in good condition.  Patient's case discussed with Dr.  Wilson Singer who agrees with plan to discharge with outpatient follow-up.   Note: Portions of this report may have been transcribed using voice recognition software. Every effort was made to ensure accuracy; however, inadvertent computerized transcription errors may still be present. Final Clinical Impressions(s) / ED Diagnoses   Final diagnoses:  Fall, initial encounter  Generalized weakness    ED Discharge Orders    None       Gari Crown 06/02/19 2103    Virgel Manifold, MD 06/03/19 [phone]

## 2019-06-02 NOTE — Telephone Encounter (Signed)
Pt's family called pt was on side of bed and fell off, unsure if lightheaded or shocked or just fell.  Was in floor for 1 hour has scrape on knee and black eye per brother.  He agrees to come to ER for eval.

## 2019-06-02 NOTE — ED Notes (Addendum)
Pacemaker will not interrogate. [place] contacted.

## 2019-06-03 ENCOUNTER — Encounter (HOSPITAL_COMMUNITY): Payer: Self-pay

## 2019-06-04 ENCOUNTER — Other Ambulatory Visit (HOSPITAL_COMMUNITY): Payer: Self-pay

## 2019-06-04 ENCOUNTER — Encounter (HOSPITAL_COMMUNITY): Payer: Self-pay | Admitting: Cardiology

## 2019-06-04 ENCOUNTER — Other Ambulatory Visit: Payer: Self-pay

## 2019-06-04 ENCOUNTER — Ambulatory Visit (HOSPITAL_COMMUNITY)
Admission: RE | Admit: 2019-06-04 | Discharge: 2019-06-04 | Disposition: A | Discharge status: Home / Self Care | Payer: Self-pay | Source: Ambulatory Visit | Attending: Cardiology | Admitting: Cardiology

## 2019-06-04 VITALS — BP 96/69 | HR 103 | Wt 252.8 lb

## 2019-06-04 DIAGNOSIS — I428 Other cardiomyopathies: Secondary | ICD-10-CM | POA: Insufficient documentation

## 2019-06-04 DIAGNOSIS — Z7984 Long term (current) use of oral hypoglycemic drugs: Secondary | ICD-10-CM | POA: Insufficient documentation

## 2019-06-04 DIAGNOSIS — I5022 Chronic systolic (congestive) heart failure: Secondary | ICD-10-CM | POA: Insufficient documentation

## 2019-06-04 DIAGNOSIS — B9561 Methicillin susceptible Staphylococcus aureus infection as the cause of diseases classified elsewhere: Secondary | ICD-10-CM | POA: Insufficient documentation

## 2019-06-04 DIAGNOSIS — Z8249 Family history of ischemic heart disease and other diseases of the circulatory system: Secondary | ICD-10-CM | POA: Insufficient documentation

## 2019-06-04 DIAGNOSIS — Z79899 Other long term (current) drug therapy: Secondary | ICD-10-CM | POA: Insufficient documentation

## 2019-06-04 DIAGNOSIS — E119 Type 2 diabetes mellitus without complications: Secondary | ICD-10-CM | POA: Insufficient documentation

## 2019-06-04 DIAGNOSIS — R2689 Other abnormalities of gait and mobility: Secondary | ICD-10-CM

## 2019-06-04 DIAGNOSIS — M868X1 Other osteomyelitis, shoulder: Secondary | ICD-10-CM | POA: Insufficient documentation

## 2019-06-04 DIAGNOSIS — I472 Ventricular tachycardia, unspecified: Secondary | ICD-10-CM

## 2019-06-04 LAB — COMPREHENSIVE METABOLIC PANEL
ALT: 40 U/L (ref 0–44)
AST: 57 U/L — ABNORMAL HIGH (ref 15–41)
Albumin: 3.4 g/dL — ABNORMAL LOW (ref 3.5–5.0)
Alkaline Phosphatase: 117 U/L (ref 38–126)
Anion gap: 12 (ref 5–15)
BUN: 13 mg/dL (ref 6–20)
CO2: 21 mmol/L — ABNORMAL LOW (ref 22–32)
Calcium: 8.9 mg/dL (ref 8.9–10.3)
Chloride: 107 mmol/L (ref 98–111)
Creatinine, Ser: 1.23 mg/dL (ref 0.61–1.24)
GFR calc Af Amer: >60 mL/min (ref >60)
GFR calc non Af Amer: >60 mL/min (ref >60)
Glucose, Bld: 121 mg/dL — ABNORMAL HIGH (ref 70–99)
Potassium: 3.8 mmol/L (ref 3.5–5.1)
Sodium: 140 mmol/L (ref 135–145)
Total Bilirubin: 1.1 mg/dL (ref 0.3–1.2)
Total Protein: 6.3 g/dL — ABNORMAL LOW (ref 6.5–8.1)

## 2019-06-04 LAB — TSH: TSH: 7.027 u[IU]/mL — ABNORMAL HIGH (ref 0.350–4.500)

## 2019-06-04 MED ORDER — SPIRONOLACTONE 25 MG PO TABS: 12.5000 mg | ORAL_TABLET | Freq: Every day | ORAL | 11 refills | Status: DC

## 2019-06-04 MED ORDER — SPIRONOLACTONE 25 MG PO TABS: 12.5000 mg | ORAL_TABLET | Freq: Every day | ORAL | 11 refills | Status: AC

## 2019-06-04 NOTE — Progress Notes (Signed)
Referral sent to [facility] for physical therapy.

## 2019-06-04 NOTE — Patient Instructions (Addendum)
DECREASE Spironolactone to 12.5mg  (1/2 tab) daily  You will be referred for home physical therapy.  You will get a call to arrange a visit to your home  Labs today  We will only contact you if something comes back abnormal or we need to make some changes. Otherwise no news is good news!  Wear Compression stocking during the day. A prescription have been given to you in office.  You can obtain the compression stockings at [facility] on [facility], you and your health needs are our priority. As part of our continuing mission to provide you with exceptional heart care, we have created designated Provider Care Teams. These Care Teams include your primary Cardiologist (physician) and Advanced Practice Providers (APPs- Physician Assistants and Nurse Practitioners) who all work together to provide you with the care you need, when you need it.   Your physician recommends that you schedule a follow-up appointment in: 1 month with Dr Kendall Flack may see any of the following providers on your designated Care Team at your next follow up: Marland Kitchen Dr Glori Bickers . Dr Loralie Champagne . Darrick Grinder, NP . Lyda Jester, PA   Please be sure to bring in all your medications bottles to every appointment.

## 2019-06-06 NOTE — Progress Notes (Signed)
Advanced Heart Failure Clinic Note   Patient ID: Justin Bernard, male   DOB: May 02, 1961, 58 y.o.   MRN: [id number]  PCP: Marda Stalker, PA-C with [facility] Physician Cardiology: Dr. Aundra Dubin  58 y.o.with history of nonischemic cardiomyopathy returns for for followup of CHF.    CHF was first noted in 07/2010 per patient when he developed dyspnea. He was seen at [facility]. Of note, mother had CHF diagnosis around 73 and grandfather had CHF diagnosis around 74. Pt's disease thought to be NICM with clean cath as below. 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  Initially, LVAD and transplant were considered, but EF improved greatly on medical management as echoes below demonstrate. 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   Echo in 1/18 showed EF 40-45%, diffuse hypokinesis.    Echo in 2/20 showed EF down to 25-30 with mild LV dilation and normal RV.  Echo in 10/20 showed EF back up to 40-45%.  He has had MSSA osteomyelitis of right shoulder (treated) and needs debridement.   On 05/15/19, he had a syncopal episode.  Device showed VF/shock x 2. He was markedly hypokalemic at the time and had AKI. He was orthostatic.  Lasix and lisinopril were stopped.  Amiodarone was begun.   He returns today for followup of CHF. He was not orthostatic today when checked.  He still has episodes of lightheadedness with standing, no falls. He is able to walk around his house without dyspnea.  Not going out of house on his own due to fear of falling.  No chest pain.  No orthopnea/PND.    Labs (12/16): K 3.5, creatinine 0.82, HCT 0.82 Labs (3/17): K 3.7, creatinine 0.67 Labs (02/2017): K 3.8, creatinine 0.91 Labs (8/18): K 3.8, creatinine 1.05, hgb 11.4 Labs (10/19): K 3.8, creatinine 1.31 Labs (10/20): K 5.2, creatinine 1.23  ECG (personally reviewed): sinus tachy 116, LVH  SH: He lives with his 2 nephews and  son in [place].  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   FH: Mother with CHF around 48, grandfather with CHF around 75.   ROS: All systems reviewed and negative except as per HPI.   PMH:  1. Chronic systolic CHF:  Nonischemic cardiomyopathy, initially followed at [facility]. [place] 08/13/10 [facility], [place]: areas or reversibility in anterior wall and lesser extent inferior wall. Dilated LV with EF 15% and severe hypokinesis. - LHC 08/31/10 [facility], [place]: Right dominant. Left main free of disease, branches into LAD and cicrumflex. Large caliber vessels with no significant stenosis. No intimal irregularities in RCA. Elevated LVEDP at 40 - ECHO 08/12/2010 [facility], [place]: EF <24% - ECHO 09/27/2010 [place], [place]: EF 15% with moderate RVE with marked dysfunction, moderate MR/TR, trace AR, mild PR, moderate LV dilation, moderate RV dilation, Severe LAE, mild RAE.  - ECHO 01/08/2012 [place]: EF <25%, Grade 1 DD, MR/TR significantly improved. - ECHO 09/21/2011 [place]: EF 45-55%, Grade 1 DD, RV mildly dilated and reduced, Trace MR/PR/TR, no AR. - ECHO 03/21/2012 [place]: EF 45%, Grade 1 DD, Trace MR,PR,TR.  - ECHO 12/16: EF 50-55%, mild LVH.  - ECHO 1/18: EF 40-45%, diffuse HK, mildly dilated LV, mild diffuse hypokinesis.  - ECHO 2/20: EF 25-30%.  - ECHO 10/20: EF 40-45%.  2. Type II diabetes 3. Right humeral fracture (12/16).  4. Orthostatic hypotension.  5. Nephrolithiasis: s/p lithotripsy 8/18.  6. Right shoulder  osteomyelitis, MSSA  Current Outpatient Medications  Medication Sig Dispense Refill  . albuterol (PROVENTIL HFA;VENTOLIN HFA) 108 (90 Base) MCG/ACT inhaler Inhale 2 puffs into the lungs every 6 (six) hours as needed for wheezing or shortness of breath.    Marland Kitchen amiodarone (PACERONE) 200 MG tablet Take 2 tablets (400 mg)) once daily. 180 tablet 1  . carvedilol (COREG) 3.125 MG tablet Take 1 tablet (3.125 mg total) by mouth 2 (two)  times daily with a meal. 60 tablet 6  . glipiZIDE (GLUCOTROL XL) 5 MG 24 hr tablet Take 5 mg by mouth daily with breakfast.     . glucose monitoring kit (FREESTYLE) monitoring kit 1 each by Does not apply route 4 (four) times daily - after meals and at bedtime. 1 month Diabetic Testing Supplies for QAC-QHS accuchecks. 1 each 1  . hydrocortisone cream 1 % Apply 1 application topically 2 (two) times daily as needed for itching.    . Magnesium Oxide 400 (240 Mg) MG TABS Take 1 tablet (400 mg total) by mouth 2 (two) times daily. 60 tablet 2  . Multiple Vitamin (MULTIVITAMIN WITH MINERALS) TABS tablet Take 1 tablet by mouth daily. 30 tablet 0  . potassium chloride SA (KLOR-CON) 20 MEQ tablet Take 2 tablets by mouth in the a.m,, take 1 tablet by mouth in the afternoon 90 tablet 3  . spironolactone (ALDACTONE) 25 MG tablet Take 0.5 tablets (12.5 mg total) by mouth daily. 15 tablet 11   No current facility-administered medications for this encounter.    BP 96/69   Pulse (!) 103   Wt 114.7 kg (252 lb 12.8 oz)   SpO2 92%   BMI 30.37 kg/m    Wt Readings from Last 3 Encounters:  06/04/19 114.7 kg (252 lb 12.8 oz)  06/02/19 117 kg (258 lb)  05/27/19 117 kg (258 lb)    General: NAD Neck: No JVD, no thyromegaly or thyroid nodule.  Lungs: Clear to auscultation bilaterally with normal respiratory effort. CV: Nondisplaced PMI.  Heart regular S1/S2, no S3/S4, no murmur.  No peripheral edema.  No carotid bruit.  Normal pedal pulses.  Abdomen: Soft, nontender, no hepatosplenomegaly, no distention.  Skin: Intact without lesions or rashes.  Neurologic: Alert and oriented x 3.  Psych: Normal affect. Extremities: No clubbing or cyanosis.  HEENT: Normal.   Assessment/Plan: 1. Chronic systolic CHF: Nonischemic cardiomyopathy.  Most recent echo in 10/20 with EF 40-45%, diffuse hypokinesis.  [place].  NYHA class II-III symptoms, has had prominent lightheadedness.  He is not volume overloaded on  exam.  - He can continue to stay off Lasix.   - Continue Coreg 3.125 mg bid.  - Decrease spironolactone to 12.5 mg daily.     - Hold off on ACEI/ARB/ARNI with soft BP, orthostasis.   - Wear compression stockings during the day.    2. Right shoulder osteomyelitis: MSSA.  Plan for operative debridement in the future with Dr. Marcelino Scot.  I think that he is of reasonable risk to undergo surgery.  3. VF: Patient had an episode of VF terminated by his ICD in 10/20.  This may have been related to profound hypokalemia.  - BMET today.  - Continue amiodarone x 2 months per EP. Check LFTs and TSH today.   Followup in 1 month.     Loralie Champagne, MD  06/06/2019

## 2019-06-07 ENCOUNTER — Telehealth (HOSPITAL_COMMUNITY): Payer: Self-pay

## 2019-06-07 NOTE — Telephone Encounter (Signed)
Referral for home PT faxed to [facility]

## 2019-06-12 ENCOUNTER — Telehealth (HOSPITAL_COMMUNITY): Payer: Self-pay | Admitting: Licensed Clinical Social Worker

## 2019-06-12 NOTE — Telephone Encounter (Signed)
CSW received phone call from pt sister inquiring about status of CAFA application.  CSW reviewed account and saw that CAFA had been received but not processed because his account was still stating he was Medicaid potential- future note clarified that pt was NOT eligible for any medicaid programs so should be no barrier to continuing with CAFA.  CSW Educational psychologist who is reviewing pt CAFA to inquire about status- states that due to some technical difficulties they were unaware pt was not eligible for Medicaid programs but will process his application now.  Left message for pt sister to inform- CSW will continue to follow and assist as needed.  Jorge Ny, LCSW Clinical Social Worker Advanced Heart Failure Clinic Desk#: [phone] Cell#: [phone]

## 2019-06-24 ENCOUNTER — Telehealth (HOSPITAL_COMMUNITY): Payer: Self-pay | Admitting: Licensed Clinical Social Worker

## 2019-06-24 NOTE — Telephone Encounter (Signed)
CSW received call from pt sister inquiring about pt [facility].  CSW looked at financial counseling notes and states:   CHARITY COMPLETE. PATIENT APPROVED FOR 75% FINANCIAL ASSISTANCE PER CAFA APPLICATION AND SUPPORTING DOCUMENTATION.  CAFA APP SCANNED TO ACCOUNT [id number] APPROVAL DATES OF FPL ---- 05/14/19 - 11/11/19   CSW updated pt sister on approval information.  Jorge Ny, LCSW Clinical Social Worker Advanced Heart Failure Clinic Desk#: [phone] Cell#: [phone]

## 2019-07-04 ENCOUNTER — Encounter (HOSPITAL_COMMUNITY): Payer: Medicaid Other | Admitting: Cardiology

## 2019-07-26 ENCOUNTER — Ambulatory Visit (INDEPENDENT_AMBULATORY_CARE_PROVIDER_SITE_OTHER): Payer: Self-pay | Admitting: Student

## 2019-07-26 ENCOUNTER — Encounter: Payer: Self-pay | Admitting: Student

## 2019-07-26 ENCOUNTER — Other Ambulatory Visit: Payer: Self-pay

## 2019-07-26 VITALS — BP 140/80 | HR 58 | Ht 76.0 in | Wt 248.8 lb

## 2019-07-26 DIAGNOSIS — I5022 Chronic systolic (congestive) heart failure: Secondary | ICD-10-CM

## 2019-07-26 DIAGNOSIS — I472 Ventricular tachycardia, unspecified: Secondary | ICD-10-CM

## 2019-07-26 LAB — CUP PACEART INCLINIC DEVICE CHECK
Date Time Interrogation Session: 20201211133857
Implantable Lead Implant Date: 20120222
Implantable Lead Location: 753860
Implantable Lead Model: 296
Implantable Lead Serial Number: 100951
Implantable Pulse Generator Implant Date: 20120222
Lead Channel Pacing Threshold Amplitude: 1.4 V
Lead Channel Pacing Threshold Pulse Width: 0.5 ms
Lead Channel Setting Pacing Amplitude: 2.5 V
Lead Channel Setting Pacing Pulse Width: 0.5 ms
Pulse Gen Serial Number: 100238

## 2019-07-26 MED ORDER — AMIODARONE HCL 200 MG PO TABS: 200.0000 mg | ORAL_TABLET | Freq: Every day | ORAL | 3 refills | Status: DC

## 2019-07-26 NOTE — Patient Instructions (Signed)
Medication Instructions:  Decrease Amiodarone to 200 mg Daily *If you need a refill on your cardiac medications before your next appointment, please call your pharmacy*  Lab Work: TODAY CMET TSH FREE T3 FREE T4 CBC If you have labs (blood work) drawn today and your tests are completely normal, you will receive your results only by: Marland Kitchen MyChart Message (if you have MyChart) OR . A paper copy in the mail If you have any lab test that is abnormal or we need to change your treatment, we will call you to review the results.  Testing/Procedures: none  Follow-Up: At [facility], you and your health needs are our priority.  As part of our continuing mission to provide you with exceptional heart care, we have created designated Provider Care Teams.  These Care Teams include your primary Cardiologist (physician) and Advanced Practice Providers (APPs -  Physician Assistants and Nurse Practitioners) who all work together to provide you with the care you need, when you need it.  Your next appointment:   2 month(s)  The format for your next appointment:   In Person  Provider:   Oda Kilts, PA  Other Instructions Remote monitoring is used to monitor your  ICD from home. This monitoring reduces the number of office visits required to check your device to one time per year. It allows [place] to keep an eye on the functioning of your device to ensure it is working properly. You are scheduled for a device check from home on 08/19/19. You may send your transmission at any time that day. If you have a wireless device, the transmission will be sent automatically. After your physician reviews your transmission, you will receive a postcard with your next transmission date.

## 2019-07-26 NOTE — Progress Notes (Signed)
Electrophysiology Office Note Date: 07/26/2019  ID:  Justin Bernard, DOB 1961-05-14, MRN [id number]  PCP: Marda Stalker, PA-C Primary Cardiologist: Loralie Champagne, MD Electrophysiologist: Dr. Lovena Le   CC: Routine ICD follow-up  Justin Bernard is a 58 y.o. male seen today for Dr. Lovena Le.  They present today for routine electrophysiology followup.  Since last being seen in the hospital, he has overall been doing well.  He had an additional fall this Tuesday, with bruising to his face.  EMS came and checked him out and he was OK, but had a blood glucose of 21 at the time.  Meds have since been adjusted.  No tachy-arrythmias associated with the fall. He denies chest pain, palpitations, PND, orthopnea, nausea, vomiting, dizziness, syncope, edema, weight gain, or early satiety.  He has not had additional ICD shocks.   Device History: Research officer, political party ICD implanted 2012 for Chronic systolic CHF History of appropriate therapy: Yes History of AAD therapy: Yes, Amiodarone   Past Medical History:  Diagnosis Date  . AICD (automatic cardioverter/defibrillator) present   . Arthritis    L hip   . Asthma    pt. uses albuterol inhaler in the spring for enviromental allergies   . Cataract    LEFT EYE  . Chronic systolic heart failure (HCC)    NICM, previous heart transplant candidate with EF 10%  . Diabetes mellitus without complication ([place])    TYPE 2  . Family history of adverse reaction to anesthesia    sister- headache & N&V am and mother  . History of hiatal hernia   . History of kidney stones   . Hypertension   . Hypokalemia   . Neuromuscular disorder ([place])    essential tremor  . Osteomyelitis ([place])    a. MSSA osteomyelitis of the right shoulder.  . Ventricular fibrillation ([place])   . Ventricular tachycardia [facility])    Past Surgical History:  Procedure Laterality Date  . BACK SURGERY  07/2000   fusion , [facility]  . BONE EXCISION Right  04/29/2016   Procedure: EXCISION HETEROTOPIC BONE RIGHT HUMERUS;  Surgeon: Altamese Jamesburg, MD;  Location: [place];  Service: Orthopedics;  Laterality: Right;  . CARDIAC CATHETERIZATION  08/2012  . CYSTOSCOPY/URETEROSCOPY/HOLMIUM LASER/STENT PLACEMENT Bilateral 04/03/2017   Procedure: CYSTOSCOPY/URETEROSCOPY/HOLMIUM LASER/LEFT STENT REPLACEMENT AND RIGHT   STENT PLACEMENT;  Surgeon: Nickie Retort, MD;  Location: [facility];  Service: Urology;  Laterality: Bilateral;  . FRACTURE SURGERY Left    hip , r shoulder   . HARDWARE REMOVAL Right 04/29/2016   Procedure: HARDWARE REMOVAL;  Surgeon: Altamese , MD;  Location: [place];  Service: Orthopedics;  Laterality: Right;  . HERNIA REPAIR Right    inguinal   . HIP SURGERY Left YRS AGO   hardware present, for slipped joint   . HOLMIUM LASER APPLICATION Bilateral 02/04/6332   Procedure: HOLMIUM LASER APPLICATION;  Surgeon: Nickie Retort, MD;  Location: [facility];  Service: Urology;  Laterality: Bilateral;  . ICD  2012   [employer]  . IR URETERAL STENT LEFT NEW ACCESS W/O SEP NEPHROSTOMY CATH  02/20/2017  . NEPHROLITHOTOMY Left 02/20/2017   Procedure: NEPHROLITHOTOMY PERCUTANEOUS;  Surgeon: Nickie Retort, MD;  Location: [facility];  Service: Urology;  Laterality: Left;  . ORIF HUMERUS FRACTURE Right 07/20/2015   Procedure: OPEN REDUCTION INTERNAL FIXATION (ORIF) PROXIMAL HUMERUS FRACTURE;  Surgeon: Netta Cedars, MD;  Location: [place];  Service: Orthopedics;  Laterality: Right;  . TONSILLECTOMY  AS CHILD  . ulnar surgery Right     Current Outpatient Medications  Medication Sig Dispense Refill  . albuterol (PROVENTIL HFA;VENTOLIN HFA) 108 (90 Base) MCG/ACT inhaler Inhale 2 puffs into the lungs every 6 (six) hours as needed for wheezing or shortness of breath.    Marland Kitchen amiodarone (PACERONE) 200 MG tablet Take 2 tablets (400 mg)) once daily. 180 tablet 1  . carvedilol (COREG) 3.125 MG tablet Take 1 tablet (3.125 mg total) by mouth 2 (two) times daily  with a meal. 60 tablet 6  . glipiZIDE (GLUCOTROL XL) 5 MG 24 hr tablet Take 5 mg by mouth daily with breakfast.     . glucose monitoring kit (FREESTYLE) monitoring kit 1 each by Does not apply route 4 (four) times daily - after meals and at bedtime. 1 month Diabetic Testing Supplies for QAC-QHS accuchecks. 1 each 1  . hydrocortisone cream 1 % Apply 1 application topically 2 (two) times daily as needed for itching.    . Magnesium Oxide 400 (240 Mg) MG TABS Take 1 tablet (400 mg total) by mouth 2 (two) times daily. 60 tablet 2  . Multiple Vitamin (MULTIVITAMIN WITH MINERALS) TABS tablet Take 1 tablet by mouth daily. 30 tablet 0  . potassium chloride SA (KLOR-CON) 20 MEQ tablet Take 2 tablets by mouth in the a.m,, take 1 tablet by mouth in the afternoon 90 tablet 3  . spironolactone (ALDACTONE) 25 MG tablet Take 0.5 tablets (12.5 mg total) by mouth daily. 15 tablet 11   No current facility-administered medications for this visit.    Allergies:   Penicillins   Social History: Social History   Socioeconomic History  . Marital status: Divorced    Spouse name: Not on file  . Number of children: Not on file  . Years of education: Not on file  . Highest education level: Not on file  Occupational History  . Not on file  Tobacco Use  . Smoking status: Never Smoker  . Smokeless tobacco: Never Used  Substance and Sexual Activity  . Alcohol use: No  . Drug use: No  . Sexual activity: Not Currently  Other Topics Concern  . Not on file  Social History Narrative  . Not on file   Social Determinants of Health   Financial Resource Strain:   . Difficulty of Paying Living Expenses: Not on file  Food Insecurity:   . Worried About Charity fundraiser in the Last Year: Not on file  . Ran Out of Food in the Last Year: Not on file  Transportation Needs:   . Lack of Transportation (Medical): Not on file  . Lack of Transportation (Non-Medical): Not on file  Physical Activity:   . Days of Exercise  per Week: Not on file  . Minutes of Exercise per Session: Not on file  Stress:   . Feeling of Stress : Not on file  Social Connections:   . Frequency of Communication with Friends and Family: Not on file  . Frequency of Social Gatherings with Friends and Family: Not on file  . Attends Religious Services: Not on file  . Active Member of Clubs or Organizations: Not on file  . Attends Archivist Meetings: Not on file  . Marital Status: Not on file  Intimate Partner Violence:   . Fear of Current or Ex-Partner: Not on file  . Emotionally Abused: Not on file  . Physically Abused: Not on file  . Sexually Abused: Not on file  Family History: Family History  Problem Relation Age of Onset  . Heart failure Mother   . Hypertension Mother   . CAD Father   . Hypertension Other   . CAD Other     Review of Systems: All other systems reviewed and are otherwise negative except as noted above.   Physical Exam: There were no vitals filed for this visit.   GEN- The patient is well appearing, alert and oriented x 3 today.   HEENT: normocephalic, atraumatic; sclera clear, conjunctiva pink; hearing intact; oropharynx clear; neck supple, no JVP Lymph- no cervical lymphadenopathy Lungs- Clear to ausculation bilaterally, normal work of breathing.  No wheezes, rales, rhonchi Heart- Regular rate and rhythm, no murmurs, rubs or gallops, PMI not laterally displaced GI- soft, non-tender, non-distended, bowel sounds present, no hepatosplenomegaly Extremities- no clubbing, cyanosis, or edema; DP/PT/radial pulses 2+ bilaterally MS- no significant deformity or atrophy Skin- warm and dry, no rash or lesion; ICD pocket well healed Psych- euthymic mood, full affect Neuro- strength and sensation are intact  ICD interrogation- reviewed in detail today,  See PACEART report  EKG:  EKG is not ordered today. EKG from 06/04/2019 reviewed and shows   Recent Labs: 09/24/2018: B Natriuretic Peptide  62.4 06/02/2019: Hemoglobin 13.6; Magnesium 1.9; Platelets 487 06/04/2019: ALT 40; BUN 13; Creatinine, Ser 1.23; Potassium 3.8; Sodium 140; TSH 7.027   Wt Readings from Last 3 Encounters:  06/04/19 252 lb 12.8 oz (114.7 kg)  06/02/19 258 lb (117 kg)  05/27/19 258 lb (117 kg)     Other studies Reviewed: Additional studies/ records that were reviewed today include: Previous EP notes including from recent admission. Echo  05/16/2019 LVEF 40-45%, Most recent CHF note.  Assessment and Plan:  1.  Chronic systolic dysfunction s/p [employer] single chamber ICD  euvolemic today Stable on an appropriate medical regimen Normal ICD function See Pace Art report No changes today  2. VF with ICD shock (likely precipitated by hypokalemia) BMET and mg today.  He has had no further sustained VT, but has had occasional NSVT. Will check electrolytes and amio surveillance labs today.  He has been on amiodarone 400 mg daily. We will decrease this to 200 mg daily today, and follow for stability and plan to stop at follow up if he remains stable.    Current medicines are reviewed at length with the patient today.   The patient does not have concerns regarding his medicines.  The following changes were made today:  amidoarone decreased.   Labs/ tests ordered today include:  Orders Placed This Encounter  Procedures  . Comp Met (CMET)  . TSH  . T3, free  . T4, free  . CBC   Disposition:   Follow up with me in 2 months.   Jacalyn Lefevre, PA-C  07/26/2019 11:53 AM  [facility] [address] [place] [address] [phone] (office) [phone] (fax)

## 2019-07-27 LAB — CBC
Hematocrit: 38 % (ref 37.5–51.0)
Hemoglobin: 12.7 g/dL — ABNORMAL LOW (ref 13.0–17.7)
MCH: 30.5 pg (ref 26.6–33.0)
MCHC: 33.4 g/dL (ref 31.5–35.7)
MCV: 91 fL (ref 79–97)
Platelets: 261 10*3/uL (ref 150–450)
RBC: 4.17 x10E6/uL (ref 4.14–5.80)
RDW: 17.4 % — ABNORMAL HIGH (ref 11.6–15.4)
WBC: 6.7 10*3/uL (ref 3.4–10.8)

## 2019-07-27 LAB — COMPREHENSIVE METABOLIC PANEL
ALT: 39 IU/L (ref 0–44)
AST: 87 IU/L — ABNORMAL HIGH (ref 0–40)
Albumin/Globulin Ratio: 1.8 (ref 1.2–2.2)
Albumin: 4.2 g/dL (ref 3.8–4.9)
Alkaline Phosphatase: 184 IU/L — ABNORMAL HIGH (ref 39–117)
BUN/Creatinine Ratio: 11 (ref 9–20)
BUN: 11 mg/dL (ref 6–24)
Bilirubin Total: 1.1 mg/dL (ref 0.0–1.2)
CO2: 23 mmol/L (ref 20–29)
Calcium: 8.6 mg/dL — ABNORMAL LOW (ref 8.7–10.2)
Chloride: 94 mmol/L — ABNORMAL LOW (ref 96–106)
Creatinine, Ser: 0.98 mg/dL (ref 0.76–1.27)
GFR calc Af Amer: 99 mL/min/{1.73_m2} (ref >59)
GFR calc non Af Amer: 86 mL/min/{1.73_m2} (ref >59)
Globulin, Total: 2.4 g/dL (ref 1.5–4.5)
Glucose: 116 mg/dL — ABNORMAL HIGH (ref 65–99)
Potassium: 3.6 mmol/L (ref 3.5–5.2)
Sodium: 136 mmol/L (ref 134–144)
Total Protein: 6.6 g/dL (ref 6.0–8.5)

## 2019-07-27 LAB — T3, FREE: T3, Free: 3.3 pg/mL (ref 2.0–4.4)

## 2019-07-27 LAB — T4, FREE: Free T4: 1.3 ng/dL (ref 0.82–1.77)

## 2019-07-27 LAB — TSH: TSH: 3.24 u[IU]/mL (ref 0.450–4.500)

## 2019-08-19 ENCOUNTER — Ambulatory Visit (INDEPENDENT_AMBULATORY_CARE_PROVIDER_SITE_OTHER): Payer: Self-pay | Admitting: *Deleted

## 2019-08-19 DIAGNOSIS — I428 Other cardiomyopathies: Secondary | ICD-10-CM

## 2019-08-20 LAB — CUP PACEART REMOTE DEVICE CHECK
Battery Remaining Longevity: 48 mo
Battery Remaining Percentage: 50 %
Brady Statistic RV Percent Paced: 0 %
Date Time Interrogation Session: 20210104192200
HighPow Impedance: 67 Ohm
Implantable Lead Implant Date: 20120222
Implantable Lead Location: 753860
Implantable Lead Model: 296
Implantable Lead Serial Number: 100951
Implantable Pulse Generator Implant Date: 20120222
Lead Channel Impedance Value: 473 Ohm
Lead Channel Pacing Threshold Amplitude: 1.4 V
Lead Channel Pacing Threshold Pulse Width: 0.5 ms
Lead Channel Setting Pacing Amplitude: 2.5 V
Lead Channel Setting Pacing Pulse Width: 0.5 ms
Lead Channel Setting Sensing Sensitivity: 0.6 mV
Pulse Gen Serial Number: 100238

## 2019-08-26 ENCOUNTER — Telehealth (HOSPITAL_COMMUNITY): Payer: Self-pay | Admitting: *Deleted

## 2019-08-26 NOTE — Telephone Encounter (Signed)
pts sister is a Financial risk analyst and called to report pt has been sleeping 20hrs a day, bp drops when he stands, pt c/o dizziness. She would like pt to be seen in office. Pts last office visit in October he was supposed to f/u in 45m.  I added pt on with Dr.McLean tomorrow. Pts sister aware and thanked me for the call.

## 2019-08-27 ENCOUNTER — Encounter (HOSPITAL_COMMUNITY): Payer: Medicaid Other | Admitting: Cardiology

## 2019-08-30 ENCOUNTER — Inpatient Hospital Stay (HOSPITAL_COMMUNITY)
Admission: EM | Admit: 2019-08-30 | Discharge: 2019-09-10 | DRG: 091 | Disposition: A | Discharge status: Home Health | Payer: Self-pay | Attending: Family Medicine | Admitting: Family Medicine

## 2019-08-30 ENCOUNTER — Emergency Department (HOSPITAL_COMMUNITY): Payer: Self-pay

## 2019-08-30 ENCOUNTER — Other Ambulatory Visit: Payer: Self-pay

## 2019-08-30 ENCOUNTER — Encounter (HOSPITAL_COMMUNITY): Payer: Self-pay | Admitting: *Deleted

## 2019-08-30 DIAGNOSIS — F10231 Alcohol dependence with withdrawal delirium: Secondary | ICD-10-CM | POA: Diagnosis present

## 2019-08-30 DIAGNOSIS — J9602 Acute respiratory failure with hypercapnia: Secondary | ICD-10-CM | POA: Diagnosis present

## 2019-08-30 DIAGNOSIS — G9340 Encephalopathy, unspecified: Secondary | ICD-10-CM | POA: Diagnosis present

## 2019-08-30 DIAGNOSIS — R55 Syncope and collapse: Secondary | ICD-10-CM | POA: Diagnosis present

## 2019-08-30 DIAGNOSIS — R269 Unspecified abnormalities of gait and mobility: Secondary | ICD-10-CM | POA: Diagnosis present

## 2019-08-30 DIAGNOSIS — Z87442 Personal history of urinary calculi: Secondary | ICD-10-CM

## 2019-08-30 DIAGNOSIS — Z79899 Other long term (current) drug therapy: Secondary | ICD-10-CM

## 2019-08-30 DIAGNOSIS — E669 Obesity, unspecified: Secondary | ICD-10-CM | POA: Diagnosis present

## 2019-08-30 DIAGNOSIS — Z20822 Contact with and (suspected) exposure to covid-19: Secondary | ICD-10-CM | POA: Diagnosis present

## 2019-08-30 DIAGNOSIS — I951 Orthostatic hypotension: Secondary | ICD-10-CM

## 2019-08-30 DIAGNOSIS — E874 Mixed disorder of acid-base balance: Secondary | ICD-10-CM | POA: Diagnosis not present

## 2019-08-30 DIAGNOSIS — G92 Toxic encephalopathy: Principal | ICD-10-CM | POA: Diagnosis present

## 2019-08-30 DIAGNOSIS — G250 Essential tremor: Secondary | ICD-10-CM | POA: Diagnosis present

## 2019-08-30 DIAGNOSIS — Z95810 Presence of automatic (implantable) cardiac defibrillator: Secondary | ICD-10-CM

## 2019-08-30 DIAGNOSIS — Z6831 Body mass index (BMI) 31.0-31.9, adult: Secondary | ICD-10-CM

## 2019-08-30 DIAGNOSIS — F10931 Alcohol use, unspecified with withdrawal delirium: Secondary | ICD-10-CM | POA: Diagnosis present

## 2019-08-30 DIAGNOSIS — R0600 Dyspnea, unspecified: Secondary | ICD-10-CM

## 2019-08-30 DIAGNOSIS — E1140 Type 2 diabetes mellitus with diabetic neuropathy, unspecified: Secondary | ICD-10-CM | POA: Diagnosis present

## 2019-08-30 DIAGNOSIS — Z8249 Family history of ischemic heart disease and other diseases of the circulatory system: Secondary | ICD-10-CM

## 2019-08-30 DIAGNOSIS — Z8679 Personal history of other diseases of the circulatory system: Secondary | ICD-10-CM

## 2019-08-30 DIAGNOSIS — E876 Hypokalemia: Secondary | ICD-10-CM | POA: Diagnosis present

## 2019-08-30 DIAGNOSIS — Z955 Presence of coronary angioplasty implant and graft: Secondary | ICD-10-CM

## 2019-08-30 DIAGNOSIS — I959 Hypotension, unspecified: Secondary | ICD-10-CM | POA: Diagnosis present

## 2019-08-30 DIAGNOSIS — R451 Restlessness and agitation: Secondary | ICD-10-CM | POA: Diagnosis not present

## 2019-08-30 DIAGNOSIS — J9600 Acute respiratory failure, unspecified whether with hypoxia or hypercapnia: Secondary | ICD-10-CM | POA: Diagnosis present

## 2019-08-30 DIAGNOSIS — F1010 Alcohol abuse, uncomplicated: Secondary | ICD-10-CM

## 2019-08-30 DIAGNOSIS — R569 Unspecified convulsions: Secondary | ICD-10-CM | POA: Diagnosis present

## 2019-08-30 DIAGNOSIS — M869 Osteomyelitis, unspecified: Secondary | ICD-10-CM | POA: Diagnosis present

## 2019-08-30 DIAGNOSIS — Z781 Physical restraint status: Secondary | ICD-10-CM

## 2019-08-30 DIAGNOSIS — Z880 Allergy status to penicillin: Secondary | ICD-10-CM

## 2019-08-30 DIAGNOSIS — R5381 Other malaise: Secondary | ICD-10-CM

## 2019-08-30 DIAGNOSIS — D6959 Other secondary thrombocytopenia: Secondary | ICD-10-CM | POA: Diagnosis not present

## 2019-08-30 DIAGNOSIS — I428 Other cardiomyopathies: Secondary | ICD-10-CM | POA: Diagnosis present

## 2019-08-30 DIAGNOSIS — J9601 Acute respiratory failure with hypoxia: Secondary | ICD-10-CM | POA: Diagnosis present

## 2019-08-30 DIAGNOSIS — Z941 Heart transplant status: Secondary | ICD-10-CM

## 2019-08-30 DIAGNOSIS — I5022 Chronic systolic (congestive) heart failure: Secondary | ICD-10-CM | POA: Diagnosis present

## 2019-08-30 DIAGNOSIS — I469 Cardiac arrest, cause unspecified: Secondary | ICD-10-CM | POA: Diagnosis present

## 2019-08-30 DIAGNOSIS — N179 Acute kidney failure, unspecified: Secondary | ICD-10-CM | POA: Diagnosis present

## 2019-08-30 DIAGNOSIS — I472 Ventricular tachycardia: Secondary | ICD-10-CM | POA: Diagnosis present

## 2019-08-30 DIAGNOSIS — J45909 Unspecified asthma, uncomplicated: Secondary | ICD-10-CM | POA: Diagnosis present

## 2019-08-30 DIAGNOSIS — I110 Hypertensive heart disease with heart failure: Secondary | ICD-10-CM | POA: Diagnosis present

## 2019-08-30 DIAGNOSIS — E1136 Type 2 diabetes mellitus with diabetic cataract: Secondary | ICD-10-CM | POA: Diagnosis present

## 2019-08-30 DIAGNOSIS — E119 Type 2 diabetes mellitus without complications: Secondary | ICD-10-CM

## 2019-08-30 DIAGNOSIS — Z7984 Long term (current) use of oral hypoglycemic drugs: Secondary | ICD-10-CM

## 2019-08-30 LAB — POCT I-STAT 7, (LYTES, BLD GAS, ICA,H+H)
Acid-base deficit: 3 mmol/L — ABNORMAL HIGH (ref 0.0–2.0)
Bicarbonate: 25.9 mmol/L (ref 20.0–28.0)
Calcium, Ion: 1.12 mmol/L — ABNORMAL LOW (ref 1.15–1.40)
HCT: 43 % (ref 39.0–52.0)
Hemoglobin: 14.6 g/dL (ref 13.0–17.0)
O2 Saturation: 96 %
Patient temperature: 98.4
Potassium: 2.4 mmol/L — CL (ref 3.5–5.1)
Sodium: 139 mmol/L (ref 135–145)
TCO2: 28 mmol/L (ref 22–32)
pCO2 arterial: 62 mmHg — ABNORMAL HIGH (ref 32.0–48.0)
pH, Arterial: 7.228 — ABNORMAL LOW (ref 7.350–7.450)
pO2, Arterial: 102 mmHg (ref 83.0–108.0)

## 2019-08-30 LAB — I-STAT CHEM 8, ED
BUN: 30 mg/dL — ABNORMAL HIGH (ref 6–20)
Calcium, Ion: 1.02 mmol/L — ABNORMAL LOW (ref 1.15–1.40)
Chloride: 100 mmol/L (ref 98–111)
Creatinine, Ser: 1.7 mg/dL — ABNORMAL HIGH (ref 0.61–1.24)
Glucose, Bld: 91 mg/dL (ref 70–99)
HCT: 48 % (ref 39.0–52.0)
Hemoglobin: 16.3 g/dL (ref 13.0–17.0)
Potassium: 2.5 mmol/L — CL (ref 3.5–5.1)
Sodium: 139 mmol/L (ref 135–145)
TCO2: 25 mmol/L (ref 22–32)

## 2019-08-30 LAB — COMPREHENSIVE METABOLIC PANEL
ALT: 48 U/L — ABNORMAL HIGH (ref 0–44)
AST: 129 U/L — ABNORMAL HIGH (ref 15–41)
Albumin: 3.7 g/dL (ref 3.5–5.0)
Alkaline Phosphatase: 183 U/L — ABNORMAL HIGH (ref 38–126)
Anion gap: 21 — ABNORMAL HIGH (ref 5–15)
BUN: 28 mg/dL — ABNORMAL HIGH (ref 6–20)
CO2: 22 mmol/L (ref 22–32)
Calcium: 8.6 mg/dL — ABNORMAL LOW (ref 8.9–10.3)
Chloride: 97 mmol/L — ABNORMAL LOW (ref 98–111)
Creatinine, Ser: 2.03 mg/dL — ABNORMAL HIGH (ref 0.61–1.24)
GFR calc Af Amer: 41 mL/min — ABNORMAL LOW (ref >60)
GFR calc non Af Amer: 35 mL/min — ABNORMAL LOW (ref >60)
Glucose, Bld: 94 mg/dL (ref 70–99)
Potassium: 2.8 mmol/L — ABNORMAL LOW (ref 3.5–5.1)
Sodium: 140 mmol/L (ref 135–145)
Total Bilirubin: 2 mg/dL — ABNORMAL HIGH (ref 0.3–1.2)
Total Protein: 7 g/dL (ref 6.5–8.1)

## 2019-08-30 LAB — ACETAMINOPHEN LEVEL: Acetaminophen (Tylenol), Serum: <10 ug/mL — ABNORMAL LOW (ref 10–30)

## 2019-08-30 LAB — MAGNESIUM: Magnesium: 2 mg/dL (ref 1.7–2.4)

## 2019-08-30 LAB — SALICYLATE LEVEL: Salicylate Lvl: <7 mg/dL — ABNORMAL LOW (ref 7.0–30.0)

## 2019-08-30 LAB — ETHANOL: Alcohol, Ethyl (B): <10 mg/dL (ref <10)

## 2019-08-30 LAB — RESPIRATORY PANEL BY RT PCR (FLU A&B, COVID)
Influenza A by PCR: NEGATIVE
Influenza B by PCR: NEGATIVE
SARS Coronavirus 2 by RT PCR: NEGATIVE

## 2019-08-30 LAB — TROPONIN I (HIGH SENSITIVITY): Troponin I (High Sensitivity): 38 ng/L — ABNORMAL HIGH (ref <18)

## 2019-08-30 LAB — BRAIN NATRIURETIC PEPTIDE: B Natriuretic Peptide: 256.6 pg/mL — ABNORMAL HIGH (ref 0.0–100.0)

## 2019-08-30 LAB — CBG MONITORING, ED: Glucose-Capillary: 88 mg/dL (ref 70–99)

## 2019-08-30 MED ORDER — LORAZEPAM 1 MG PO TABS: 0.0000 mg | ORAL_TABLET | Freq: Two times a day (BID) | ORAL | Status: DC

## 2019-08-30 MED ORDER — POTASSIUM CHLORIDE 10 MEQ/100ML IV SOLN
10.0000 meq | INTRAVENOUS | Status: AC
  Administered 2019-08-30: 22:00:00 10 meq via INTRAVENOUS
  Filled 2019-08-30 (×3): qty 100

## 2019-08-30 MED ORDER — THIAMINE HCL 100 MG PO TABS
100.0000 mg | ORAL_TABLET | Freq: Every day | ORAL | Status: DC
  Administered 2019-09-01 – 2019-09-10 (×9): 100 mg via ORAL
  Filled 2019-08-30 (×11): qty 1

## 2019-08-30 MED ORDER — LORAZEPAM 2 MG/ML IJ SOLN: 0.0000 mg | Freq: Two times a day (BID) | INTRAMUSCULAR | Status: DC

## 2019-08-30 MED ORDER — HEPARIN (PORCINE) 25000 UT/250ML-% IV SOLN
1850.0000 [IU]/h | INTRAVENOUS | Status: DC
  Administered 2019-08-31 (×2): 1850 [IU]/h via INTRAVENOUS
  Filled 2019-08-30: qty 250

## 2019-08-30 MED ORDER — LORAZEPAM 2 MG/ML IJ SOLN: 1.0000 mg | Freq: Once | INTRAMUSCULAR | Status: DC

## 2019-08-30 MED ORDER — PROPOFOL 1000 MG/100ML IV EMUL
5.0000 ug/kg/min | INTRAVENOUS | Status: DC
  Administered 2019-08-31: 06:00:00 35 ug/kg/min via INTRAVENOUS
  Administered 2019-08-31: 03:00:00 10 ug/kg/min via INTRAVENOUS
  Filled 2019-08-30: qty 100

## 2019-08-30 MED ORDER — ROCURONIUM BROMIDE 50 MG/5ML IV SOLN
100.0000 mg | Freq: Once | INTRAVENOUS | Status: AC
  Administered 2019-08-30: 100 mg via INTRAVENOUS

## 2019-08-30 MED ORDER — POTASSIUM CHLORIDE CRYS ER 20 MEQ PO TBCR: 40.0000 meq | EXTENDED_RELEASE_TABLET | Freq: Once | ORAL | Status: DC

## 2019-08-30 MED ORDER — LEVETIRACETAM IN NACL 1000 MG/100ML IV SOLN
1000.0000 mg | Freq: Once | INTRAVENOUS | Status: AC
  Administered 2019-08-30: 22:00:00 1000 mg via INTRAVENOUS
  Filled 2019-08-30: qty 100

## 2019-08-30 MED ORDER — LACTATED RINGERS IV BOLUS: 500.0000 mL | Freq: Once | INTRAVENOUS | Status: DC

## 2019-08-30 MED ORDER — LORAZEPAM 2 MG/ML IJ SOLN
0.0000 mg | Freq: Four times a day (QID) | INTRAMUSCULAR | Status: DC
  Administered 2019-08-30: 22:00:00 2 mg via INTRAVENOUS
  Filled 2019-08-30: qty 1

## 2019-08-30 MED ORDER — HEPARIN BOLUS VIA INFUSION
6500.0000 [IU] | Freq: Once | INTRAVENOUS | Status: AC
  Administered 2019-08-31: 01:00:00 6500 [IU] via INTRAVENOUS
  Filled 2019-08-30: qty 6500

## 2019-08-30 MED ORDER — THIAMINE HCL 100 MG/ML IJ SOLN
100.0000 mg | Freq: Every day | INTRAMUSCULAR | Status: DC
  Administered 2019-08-30 – 2019-09-02 (×3): 100 mg via INTRAVENOUS
  Filled 2019-08-30 (×3): qty 2

## 2019-08-30 MED ORDER — PROPOFOL 1000 MG/100ML IV EMUL
INTRAVENOUS | Status: AC
  Administered 2019-08-30: 10 ug/kg/min
  Filled 2019-08-30: qty 100

## 2019-08-30 MED ORDER — LORAZEPAM 1 MG PO TABS: 0.0000 mg | ORAL_TABLET | Freq: Four times a day (QID) | ORAL | Status: DC

## 2019-08-30 MED ORDER — LORAZEPAM 2 MG/ML IJ SOLN
2.0000 mg | Freq: Once | INTRAMUSCULAR | Status: AC
  Administered 2019-08-30: 23:00:00 2 mg via INTRAVENOUS
  Filled 2019-08-30: qty 1

## 2019-08-30 MED ORDER — ETOMIDATE 2 MG/ML IV SOLN
30.0000 mg | Freq: Once | INTRAVENOUS | Status: AC
  Administered 2019-08-30: 30 mg via INTRAVENOUS

## 2019-08-30 NOTE — ED Notes (Signed)
Etomidate 30mg  iv  Rocuronium 100gm iv  Dr at bedside preparing to intubate

## 2019-08-30 NOTE — Progress Notes (Signed)
ABG results given to MD.  

## 2019-08-30 NOTE — ED Notes (Signed)
Pt pulling at  Things picking things from the air mumbling still unable to understand him

## 2019-08-30 NOTE — ED Notes (Signed)
The pt is in constant ,motion sedated somewhat  Moving all over the stgretcher

## 2019-08-30 NOTE — ED Notes (Signed)
[phone] Jasten Guyette (Sister-local family) would like an update on pt.

## 2019-08-30 NOTE — ED Triage Notes (Signed)
The pt arrived by gems from home  The pt apparently fell from a chair onto his but.  Ems arrived and felt like the pt could not breathe  And they were unable to locate a pulse then suddenly they found a pulse.  On arrival to the ed the pt had blood in his mouth from a seizure ?? That nobody saw  His speech is garbled  He is very difficult to understand  The pt denies pain  The pt has a aicd  No events were recorded whenever it was interrogated here iv per ems  They gave him versed 2.5 mg

## 2019-08-30 NOTE — ED Provider Notes (Signed)
[facility] Provider Note   CSN: [id number] Arrival date & time: 08/30/19  2018  LEVEL 5 CAVEAT - ALTERED MENTAL STATUS  History Chief Complaint  Patient presents with  . Loss of Consciousness    Justin Bernard is a 59 y.o. male.  HPI 59 year old male s/p CPR. Patient was originally called out for a fall and needing help getting up. Ground level fall, seemed to slip/lose strength in legs. No obvious injuries. Got helped into bed, stiffened up and had "posturing" and then shortly after lost pulses. 2 minutes of CPR, then got pulses back, BP up to 180. Had stiffening/posturing again, given 2.5 mg versed. Now talking but confused. According to family prior to EMS transfer, was feeling normal prior to this.   Past Medical History:  Diagnosis Date  . AICD (automatic cardioverter/defibrillator) present   . Arthritis    L hip   . Asthma    pt. uses albuterol inhaler in the spring for enviromental allergies   . Cataract    LEFT EYE  . Chronic systolic heart failure (HCC)    NICM, previous heart transplant candidate with EF 10%  . Diabetes mellitus without complication ([place])    TYPE 2  . Family history of adverse reaction to anesthesia    sister- headache & N&V am and mother  . History of hiatal hernia   . History of kidney stones   . Hypertension   . Hypokalemia   . Neuromuscular disorder ([place])    essential tremor  . Osteomyelitis ([place])    a. MSSA osteomyelitis of the right shoulder.  . Ventricular fibrillation ([place])   . Ventricular tachycardia [facility])     Patient Active Problem List   Diagnosis Date Noted  . Ventricular tachyarrhythmia ([place]) 05/18/2019  . NICM (nonischemic cardiomyopathy) ([place]) 05/18/2019  . AKI (acute kidney injury) ([place]) 05/18/2019  . Syncope and collapse 05/18/2019  . ICD (implantable cardioverter-defibrillator) discharge 05/16/2019  . Infection of shoulder ([place]) 05/29/2018  . Nephrolithiasis 02/20/2017  .  ETOH abuse 09/22/2016  . Orthostatic hypotension 09/10/2016  . Urinary retention 09/10/2016  . Chronic venous stasis dermatitis 09/08/2016  . Diabetes mellitus type II, non insulin dependent ([place]) 08/13/2016  . Painful orthopaedic hardware ([place]) 04/29/2016  . S/P hardware removal 04/29/2016  . ICD (implantable cardioverter-defibrillator) in place 08/04/2015  . Hypokalemia 07/17/2015  . Chronic systolic CHF (congestive heart failure), NYHA class 2 ([place]) 07/17/2015  . Fracture closed, humerus 07/17/2015    Past Surgical History:  Procedure Laterality Date  . BACK SURGERY  07/2000   fusion , [facility]  . BONE EXCISION Right 04/29/2016   Procedure: EXCISION HETEROTOPIC BONE RIGHT HUMERUS;  Surgeon: Altamese Petersburg, MD;  Location: [place];  Service: Orthopedics;  Laterality: Right;  . CARDIAC CATHETERIZATION  08/2012  . CYSTOSCOPY/URETEROSCOPY/HOLMIUM LASER/STENT PLACEMENT Bilateral 04/03/2017   Procedure: CYSTOSCOPY/URETEROSCOPY/HOLMIUM LASER/LEFT STENT REPLACEMENT AND RIGHT   STENT PLACEMENT;  Surgeon: Nickie Retort, MD;  Location: [facility];  Service: Urology;  Laterality: Bilateral;  . FRACTURE SURGERY Left    hip , r shoulder   . HARDWARE REMOVAL Right 04/29/2016   Procedure: HARDWARE REMOVAL;  Surgeon: Altamese [place], MD;  Location: [place];  Service: Orthopedics;  Laterality: Right;  . HERNIA REPAIR Right    inguinal   . HIP SURGERY Left YRS AGO   hardware present, for slipped joint   . HOLMIUM LASER APPLICATION Bilateral 09/04/9756   Procedure: HOLMIUM LASER APPLICATION;  Surgeon:  Nickie Retort, MD;  Location: [facility];  Service: Urology;  Laterality: Bilateral;  . ICD  2012   [employer]  . IR URETERAL STENT LEFT NEW ACCESS W/O SEP NEPHROSTOMY CATH  02/20/2017  . NEPHROLITHOTOMY Left 02/20/2017   Procedure: NEPHROLITHOTOMY PERCUTANEOUS;  Surgeon: Nickie Retort, MD;  Location: [facility];  Service: Urology;  Laterality: Left;  . ORIF HUMERUS FRACTURE Right 07/20/2015    Procedure: OPEN REDUCTION INTERNAL FIXATION (ORIF) PROXIMAL HUMERUS FRACTURE;  Surgeon: Netta Cedars, MD;  Location: [place];  Service: Orthopedics;  Laterality: Right;  . TONSILLECTOMY  AS CHILD  . ulnar surgery Right        Family History  Problem Relation Age of Onset  . Heart failure Mother   . Hypertension Mother   . CAD Father   . Hypertension Other   . CAD Other     Social History   Tobacco Use  . Smoking status: Never Smoker  . Smokeless tobacco: Never Used  Substance Use Topics  . Alcohol use: No  . Drug use: No    Home Medications Prior to Admission medications   Medication Sig Start Date End Date Taking? Authorizing Provider  albuterol (PROVENTIL HFA;VENTOLIN HFA) 108 (90 Base) MCG/ACT inhaler Inhale 2 puffs into the lungs every 6 (six) hours as needed for wheezing or shortness of breath.   Yes [provider]  amiodarone (PACERONE) 200 MG tablet Take 1 tablet (200 mg total) by mouth daily. 07/26/19  Yes [name] Friar, PA-C  carvedilol (COREG) 3.125 MG tablet Take 1 tablet (3.125 mg total) by mouth 2 (two) times daily with a meal. 05/18/19  Yes Dunn, Dayna N, PA-C  hydrocortisone cream 1 % Apply 1 application topically 2 (two) times daily as needed for itching.   Yes [provider]  Magnesium Oxide 400 (240 Mg) MG TABS Take 1 tablet (400 mg total) by mouth 2 (two) times daily. 05/31/19  Yes Dunn, Dayna N, PA-C  Multiple Vitamin (MULTIVITAMIN WITH MINERALS) TABS tablet Take 1 tablet by mouth daily. 08/22/16  Yes Elgergawy, Silver Huguenin, MD  potassium chloride SA (KLOR-CON) 20 MEQ tablet Take 2 tablets by mouth in the a.m,, take 1 tablet by mouth in the afternoon 05/31/19  Yes Dunn, Dayna N, PA-C  spironolactone (ALDACTONE) 25 MG tablet Take 0.5 tablets (12.5 mg total) by mouth daily. 06/04/19  Yes Larey Dresser, MD  glucose monitoring kit (FREESTYLE) monitoring kit 1 each by Does not apply route 4 (four) times daily - after meals and at  bedtime. 1 month Diabetic Testing Supplies for QAC-QHS accuchecks. 07/23/15   Ghimire, Henreitta Leber, MD    Allergies    Penicillins  Review of Systems   Review of Systems  Unable to perform ROS: Mental status change    Physical Exam Updated Vital Signs BP (!) 123/110   Pulse 100   Temp 98.4 F (36.9 C) (Oral)   Resp (!) 25   Ht _0  (1.93 m)   Wt 117 kg   SpO2 94%   BMI 31.40 kg/m   Physical Exam Vitals and nursing note reviewed.  Constitutional:      Appearance: He is well-developed. He is ill-appearing.  HENT:     Head: Normocephalic and atraumatic.     Comments: Dried lips with dried blood. Dry oropharynx    Right Ear: External ear normal.     Left Ear: External ear normal.     Nose: Nose normal.  Eyes:  General:        Right eye: No discharge.        Left eye: No discharge.     Extraocular Movements: Extraocular movements intact.     Pupils: Pupils are equal, round, and reactive to light.  Cardiovascular:     Rate and Rhythm: Regular rhythm. Tachycardia present.     Heart sounds: Normal heart sounds.  Pulmonary:     Effort: Pulmonary effort is normal.     Breath sounds: Examination of the right-middle field reveals decreased breath sounds. Examination of the right-lower field reveals decreased breath sounds. Decreased breath sounds present.  Abdominal:     Palpations: Abdomen is soft.     Tenderness: There is no abdominal tenderness.  Musculoskeletal:     Cervical back: Neck supple.     Comments: Scar to right anterior shoulder. No wound but there is some drops of fluid on the bandage.  Skin:    General: Skin is warm and dry.  Neurological:     Mental Status: He is alert.     Comments: Awake, alert, but altered. Moves all 4 extremities symmetrically on command  Psychiatric:        Mood and Affect: Mood is not anxious.     ED Results / Procedures / Treatments   Labs (all labs ordered are listed, but only abnormal results are displayed) Labs  Reviewed  COMPREHENSIVE METABOLIC PANEL - Abnormal; Notable for the following components:      Result Value   Potassium 2.8 (*)    Chloride 97 (*)    BUN 28 (*)    Creatinine, Ser 2.03 (*)    Calcium 8.6 (*)    AST 129 (*)    ALT 48 (*)    Alkaline Phosphatase 183 (*)    Total Bilirubin 2.0 (*)    GFR calc non Af Amer 35 (*)    GFR calc Af Amer 41 (*)    Anion gap 21 (*)    All other components within normal limits  BRAIN NATRIURETIC PEPTIDE - Abnormal; Notable for the following components:   B Natriuretic Peptide 256.6 (*)    All other components within normal limits  SALICYLATE LEVEL - Abnormal; Notable for the following components:   Salicylate Lvl <8.2 (*)    All other components within normal limits  ACETAMINOPHEN LEVEL - Abnormal; Notable for the following components:   Acetaminophen (Tylenol), Serum <10 (*)    All other components within normal limits  I-STAT CHEM 8, ED - Abnormal; Notable for the following components:   Potassium 2.5 (*)    BUN 30 (*)    Creatinine, Ser 1.70 (*)    Calcium, Ion 1.02 (*)    All other components within normal limits  POCT I-STAT 7, (LYTES, BLD GAS, ICA,H+H) - Abnormal; Notable for the following components:   pH, Arterial 7.228 (*)    pCO2 arterial 62.0 (*)    Acid-base deficit 3.0 (*)    Potassium 2.4 (*)    Calcium, Ion 1.12 (*)    All other components within normal limits  TROPONIN I (HIGH SENSITIVITY) - Abnormal; Notable for the following components:   Troponin I (High Sensitivity) 38 (*)    All other components within normal limits  RESPIRATORY PANEL BY RT PCR (FLU A&B, COVID)  URINE CULTURE  ETHANOL  MAGNESIUM  RAPID URINE DRUG SCREEN, HOSP PERFORMED  URINALYSIS, ROUTINE W REFLEX MICROSCOPIC  CBC WITH DIFFERENTIAL/PLATELET  HEPARIN LEVEL (UNFRACTIONATED)  CBG MONITORING, ED  TROPONIN I (HIGH SENSITIVITY)    EKG EKG Interpretation  Date/Time:  Friday August 30 2019 20:24:37 EST Ventricular Rate:  114 PR Interval:     QRS Duration: 116 QT Interval:  339 QTC Calculation: 467 R Axis:   -5 Text Interpretation: Sinus tachycardia Ventricular premature complex Incomplete left bundle branch block Anterior Q waves, possibly due to ILBBB no significant change since Oct 2020 Confirmed by Sherwood Gambler [phone]) on 08/30/2019 8:30:59 PM   Radiology CT Head Wo Contrast  Result Date: 08/30/2019 CLINICAL DATA:  Seizure, nontraumatic (Age >= 8y) EXAM: CT HEAD WITHOUT CONTRAST TECHNIQUE: Contiguous axial images were obtained from the base of the skull through the vertex without intravenous contrast. COMPARISON:  Head CT 08/18/2016 FINDINGS: Brain: No intracranial hemorrhage, mass effect, or midline shift. Mild generalized atrophy, stable from prior. No hydrocephalus. Chronic pineal calcifications, incidental. The basilar cisterns are patent. No evidence of territorial infarct or acute ischemia. No extra-axial or intracranial fluid collection. Vascular: No hyperdense vessel or unexpected calcification. Skull: No fracture or focal lesion. Sinuses/Orbits: Small mucous retention cyst in the left maxillary sinus. Paranasal sinuses are otherwise clear. Postsurgical change of the maxillary sinuses anteriorly. Bilateral cataract resection. Other: Small calcification in the right posterior scalp, unchanged. IMPRESSION: No acute intracranial abnormality. Electronically Signed   By: Keith Rake M.D.   On: 08/30/2019 21:05   DG Chest Portable 1 View  Result Date: 08/30/2019 CLINICAL DATA:  Post CPR. EXAM: PORTABLE CHEST 1 VIEW COMPARISON:  06/02/2019 FINDINGS: Left AICD remains in place, unchanged. Cardiomegaly, vascular congestion. Bibasilar atelectasis. No effusions or acute bony abnormality. IMPRESSION: Cardiomegaly, vascular congestion.  Bibasilar atelectasis. Electronically Signed   By: Rolm Baptise M.D.   On: 08/30/2019 21:10    Procedures .Critical Care Performed by: Sherwood Gambler, MD Authorized by: Sherwood Gambler, MD    Critical care provider statement:    Critical care time (minutes):  62   Critical care time was exclusive of:  Separately billable procedures and treating other patients   Critical care was necessary to treat or prevent imminent or life-threatening deterioration of the following conditions:  Respiratory failure and CNS failure or compromise   Critical care was time spent personally by me on the following activities:  Discussions with consultants, evaluation of patient's response to treatment, examination of patient, ordering and performing treatments and interventions, ordering and review of laboratory studies, ordering and review of radiographic studies, pulse oximetry, re-evaluation of patient's condition, obtaining history from patient or surrogate, review of old charts and ventilator management Procedure Name: Intubation Date/Time: 08/30/2019 11:56 PM Performed by: Sherwood Gambler, MD Pre-anesthesia Checklist: Patient identified, Patient being monitored, Emergency Drugs available, Timeout performed and Suction available Oxygen Delivery Method: Non-rebreather mask Preoxygenation: Pre-oxygenation with 100% oxygen Induction Type: Rapid sequence Ventilation: Mask ventilation without difficulty Laryngoscope Size: Glidescope and 3 Grade View: Grade II Tube size: 7.5 mm Number of attempts: 1 Airway Equipment and Method: Video-laryngoscopy Placement Confirmation: ETT inserted through vocal cords under direct vision,  CO2 detector and Breath sounds checked- equal and bilateral Secured at: 22 cm Tube secured with: ETT holder Dental Injury: Teeth and Oropharynx as per pre-operative assessment       (including critical care time)  Medications Ordered in ED Medications  potassium chloride 10 mEq in 100 mL IVPB (10 mEq Intravenous New Bag/Given 08/30/19 2209)  LORazepam (ATIVAN) injection 0-4 mg (2 mg Intravenous Given 08/30/19 2154)    Or  LORazepam (ATIVAN) tablet 0-4 mg ( Oral See  Alternative 08/30/19 2154)  LORazepam (ATIVAN) injection 0-4 mg (has no administration in time range)    Or  LORazepam (ATIVAN) tablet 0-4 mg (has no administration in time range)  thiamine tablet 100 mg ( Oral See Alternative 08/30/19 2152)    Or  thiamine (B-1) injection 100 mg (100 mg Intravenous Given 08/30/19 2152)  lactated ringers bolus 500 mL (has no administration in time range)  LORazepam (ATIVAN) injection 1 mg (has no administration in time range)  potassium chloride SA (KLOR-CON) CR tablet 40 mEq (has no administration in time range)  LORazepam (ATIVAN) injection 2 mg (has no administration in time range)  heparin bolus via infusion 6,500 Units (has no administration in time range)  heparin ADULT infusion 100 units/mL (25000 units/266m sodium chloride 0.45%) (has no administration in time range)  levETIRAcetam (KEPPRA) IVPB 1000 mg/100 mL premix (0 mg Intravenous Stopped 08/30/19 2202)    ED Course  I have reviewed the triage vital signs and the nursing notes.  Pertinent labs & imaging results that were available during my care of the patient were reviewed by me and considered in my medical decision making (see chart for details).    MDM Rules/Calculators/A&P                      Patient presents status post cardiac arrest versus seizure.  His mental status has slowly improved since arrival.  He is currently protecting his airway.  He is slowly developing a little more mental status changes as his ED stay goes on however.  Starting to pull out his mask a little.  However he is still redirectable.  Neurology was consulted and is not sure if this was syncope with low blood pressure versus a true seizure.  Recommends 1 dose of 1 g Keppra and 1 Ativan and placing on CIWA.  Discussed with Dr. RMarlowe Saxfor admission.  She recommends IV heparin given he is significantly hypoxic on a Ventimask with no other clear reason.  Cannot get CT given his current GFR.  After she has evaluated him,  she recommends ICU admission for Precedex which I think is reasonable.  He will be given more Ativan at this time and ICU will be consulted.  While awaiting admission, on reevaluation, he is more somnolent.  I think his course projects that he will get worse and so he is intubated for airway protection.  He is quite ill.  I did try to call his sister back but she did not answer when I tried to update on him being intubated.  I did update the ICU and they will admit.  They recommend propofol for now as opposed to Precedex while he is on the vent.  He is noted now to have a little bit of purulent fluid coming from his right shoulder wound.  Unclear if this is really a source of infection but I think is reasonable to treat as he is obviously an ill patient.  Justin Narezwas evaluated in Emergency Department on 08/30/2019 for the symptoms described in the history of present illness. He was evaluated in the context of the global COVID-19 pandemic, which necessitated consideration that the patient might be at risk for infection with the SARS-CoV-2 virus that causes COVID-19. Institutional protocols and algorithms that pertain to the evaluation of patients at risk for COVID-19 are in a state of rapid change based on information released by regulatory bodies including the CDC and federal and state organizations. These policies and algorithms were followed  during the patient's care in the ED.  Final Clinical Impression(s) / ED Diagnoses Final diagnoses:  Cardiac arrest ([place])  Alcohol withdrawal syndrome, with delirium ([place])  Hypokalemia    Rx / DC Orders ED Discharge Orders    None       Sherwood Gambler, MD 08/31/19 0000

## 2019-08-30 NOTE — ED Notes (Signed)
CBG Results of 88 reported to [facility], Charity fundraiser.

## 2019-08-30 NOTE — Progress Notes (Signed)
This note also relates to the following rows which could not be included: Pulse Rate - Cannot attach notes to unvalidated device data Resp - Cannot attach notes to unvalidated device data BP - Cannot attach notes to unvalidated device data SpO2 - Cannot attach notes to unvalidated device data  Pt trialed on 2L [place], but SpO2 remained in the 80's. Pt placed back on 15L [place] at this time. RT will continue to monitor, and wean O2 as tolerated.

## 2019-08-30 NOTE — ED Notes (Signed)
ijntubated by dr Criss Alvine  numbner 22 at the lip

## 2019-08-30 NOTE — Consult Note (Addendum)
Neurology Consultation  Reason for Consult: Collapse, unresponsiveness, possible seizure Referring Physician: Dr. Sherwood Gambler  CC: Collapse, unresponsiveness, possible seizure  History is obtained from: Chart, ED providers  HPI: Justin Bernard is a 59 y.o. male past medical history of AICD placement, chronic systolic heart failure, diabetes, hypertension, essential tremor, hypokalemia, presented to the emergency room status post CPR today when EMS was called out because he fell without hitting his head and needed assistance to get up.  EMS restart his house.  They could not locate a pulse.  CPR was done for about 2 minutes prior to ROSC.  They also noted that he had stiffened up and had some posturing shortly after.  Received 2.5 mg of Versed on the way.  Appeared very unresponsive but then slowly started to recover on the way to the hospital. AICD interrogation unremarkable for an acute event. No clear witnessed seizure activity but some concern for stiffening and tonic posturing. Patient has an extensive history of alcohol abuse, reports that he had a last drink yesterday-"tiny drink". Along with a history from the chart, the ED provider was able to reach some family member who said that he had been secretly drinking a lot and that took his liquor away a few days ago.  But he still says he was drinking.  He had been seen in 2018 by the neurology service, for concerns for ataxia and gait imbalance.  No clear etiology of his worsening gait was evident but he did not have a normal gait at baseline even then.  Essential tremor also has been a problem for him for many years.  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  Today, he was also noted to have deranged renal function with elevated creatinine, severe hypokalemia.  CT head in the emergency room was unremarkable for any acute process.   ROS:ROS was performed and is  negative except as noted in the HPI.    Past Medical History:  Diagnosis Date  . AICD (automatic cardioverter/defibrillator) present   . Arthritis    L hip   . Asthma    pt. uses albuterol inhaler in the spring for enviromental allergies   . Cataract    LEFT EYE  . Chronic systolic heart failure (HCC)    NICM, previous heart transplant candidate with EF 10%  . Diabetes mellitus without complication ([place])    TYPE 2  . Family history of adverse reaction to anesthesia    sister- headache & N&V am and mother  . History of hiatal hernia   . History of kidney stones   . Hypertension   . Hypokalemia   . Neuromuscular disorder ([place])    essential tremor  . Osteomyelitis ([place])    a. MSSA osteomyelitis of the right shoulder.  . Ventricular fibrillation ([place])   . Ventricular tachycardia (HCC)     Family History  Problem Relation Age of Onset  . Heart failure Mother   . Hypertension Mother   . CAD Father   . Hypertension Other   . CAD Other    Social History:   reports that he has never smoked. He has never used smokeless tobacco. He reports that he does not drink alcohol or use drugs.  Medications  Current Facility-Administered Medications:  .  lactated ringers bolus 500 mL, 500 mL, Intravenous, Once, Sherwood Gambler, MD .  levETIRAcetam (KEPPRA) IVPB 1000 mg/100 mL premix, 1,000 mg, Intravenous, Once, [place], [employer],  MD .  LORazepam (ATIVAN) injection 0-4 mg, 0-4 mg, Intravenous, Q6H **OR** LORazepam (ATIVAN) tablet 0-4 mg, 0-4 mg, Oral, Q6H, Sherwood Gambler, MD .  Derrill Memo ON 09/02/2019] LORazepam (ATIVAN) injection 0-4 mg, 0-4 mg, Intravenous, Q12H **OR** [START ON 09/02/2019] LORazepam (ATIVAN) tablet 0-4 mg, 0-4 mg, Oral, Q12H, Sherwood Gambler, MD .  LORazepam (ATIVAN) injection 1 mg, 1 mg, Intravenous, Once, Sherwood Gambler, MD .  potassium chloride 10 mEq in 100 mL IVPB, 10 mEq, Intravenous, Q1 Hr x 3, [place], Scott, MD .  thiamine tablet 100 mg, 100 mg, Oral, Daily **OR**  thiamine (B-1) injection 100 mg, 100 mg, Intravenous, Daily, Sherwood Gambler, MD  Current Outpatient Medications:  .  albuterol (PROVENTIL HFA;VENTOLIN HFA) 108 (90 Base) MCG/ACT inhaler, Inhale 2 puffs into the lungs every 6 (six) hours as needed for wheezing or shortness of breath., Disp: , Rfl:  .  amiodarone (PACERONE) 200 MG tablet, Take 1 tablet (200 mg total) by mouth daily., Disp: 90 tablet, Rfl: 3 .  carvedilol (COREG) 3.125 MG tablet, Take 1 tablet (3.125 mg total) by mouth 2 (two) times daily with a meal., Disp: 60 tablet, Rfl: 6 .  glipiZIDE (GLUCOTROL XL) 5 MG 24 hr tablet, Take 5 mg by mouth daily with breakfast. , Disp: , Rfl:  .  glucose monitoring kit (FREESTYLE) monitoring kit, 1 each by Does not apply route 4 (four) times daily - after meals and at bedtime. 1 month Diabetic Testing Supplies for QAC-QHS accuchecks., Disp: 1 each, Rfl: 1 .  hydrocortisone cream 1 %, Apply 1 application topically 2 (two) times daily as needed for itching., Disp: , Rfl:  .  Magnesium Oxide 400 (240 Mg) MG TABS, Take 1 tablet (400 mg total) by mouth 2 (two) times daily., Disp: 60 tablet, Rfl: 2 .  Multiple Vitamin (MULTIVITAMIN WITH MINERALS) TABS tablet, Take 1 tablet by mouth daily., Disp: 30 tablet, Rfl: 0 .  potassium chloride SA (KLOR-CON) 20 MEQ tablet, Take 2 tablets by mouth in the a.m,, take 1 tablet by mouth in the afternoon, Disp: 90 tablet, Rfl: 3 .  spironolactone (ALDACTONE) 25 MG tablet, Take 0.5 tablets (12.5 mg total) by mouth daily., Disp: 15 tablet, Rfl: 11   Exam: Current vital signs: BP 124/88   Pulse 96   Temp 98.4 F (36.9 C) (Oral)   Resp (!) 28   Ht '6\' 4"'$  (1.93 m)   Wt 117 kg   SpO2 100%   BMI 31.40 kg/m  Vital signs in last 24 hours: Temp:  [98.4 F (36.9 C)] 98.4 F (36.9 C) (01/15 2044) Pulse Rate:  [96-109] 96 (01/15 2100) Resp:  [19-28] 28 (01/15 2100) BP: (115-136)/(86-96) 124/88 (01/15 2045) SpO2:  [98 %-100 %] 100 % (01/15 2100) Weight:  [117 kg] 117  kg (01/15 2056) General: Sleeping in bed, in no acute distress. HEENT: Normocephalic, blood around his lips and front teeth.  Some blood on the tongue as well as on the palate. CVS: Regular rate rhythm Respiratory: Hypoxic with nonrebreather and still saturating only at about 88 to 90% Abdomen: Nondistended nontender Extremities: Warm well perfused without edema Neurological exam Sleeping, awakens to voice, follows all commands. Speech is mildly dysarthric-also due to some dryness and blood in his mouth. No evidence of aphasia but poor attention concentration Cranial nerves: Pupils equal round react light, extraocular movements appear intact without evidence of nystagmus, face appears symmetric. Motor exam: Is antigravity in all fours with mild tremulousness. Also exhibits mild myoclonic appearing jerking-very subtle  all over his body. Sensory exam: Intact to light touch with some stocking pattern of sensory loss. DTRs: Unable to elicit knee or ankle jerks.   Labs I have reviewed labs in epic and the results pertinent to this consultation are:  CBC    Component Value Date/Time   WBC 6.7 07/26/2019 1212   WBC 10.7 (H) 06/02/2019 1425   RBC 4.17 07/26/2019 1212   RBC 5.13 06/02/2019 1425   HGB 16.3 08/30/2019 2036   HGB 12.7 (L) 07/26/2019 1212   HCT 48.0 08/30/2019 2036   HCT 38.0 07/26/2019 1212   PLT 261 07/26/2019 1212   MCV 91 07/26/2019 1212   MCH 30.5 07/26/2019 1212   MCH 26.5 06/02/2019 1425   MCHC 33.4 07/26/2019 1212   MCHC 29.7 (L) 06/02/2019 1425   RDW 17.4 (H) 07/26/2019 1212   LYMPHSABS 1.0 08/17/2016 0800   MONOABS 0.7 08/17/2016 0800   EOSABS 0.8 (H) 08/17/2016 0800   BASOSABS 0.1 08/17/2016 0800    CMP     Component Value Date/Time   NA 139 08/30/2019 2036   NA 136 07/26/2019 1212   K 2.5 (LL) 08/30/2019 2036   CL 100 08/30/2019 2036   CO2 23 07/26/2019 1212   GLUCOSE 91 08/30/2019 2036   BUN 30 (H) 08/30/2019 2036   BUN 11 07/26/2019 1212    CREATININE 1.70 (H) 08/30/2019 2036   CREATININE 1.31 05/29/2018 1016   CALCIUM 8.6 (L) 07/26/2019 1212   PROT 6.6 07/26/2019 1212   ALBUMIN 4.2 07/26/2019 1212   AST 87 (H) 07/26/2019 1212   ALT 39 07/26/2019 1212   ALKPHOS 184 (H) 07/26/2019 1212   BILITOT 1.1 07/26/2019 1212   GFRNONAA 86 07/26/2019 1212   GFRAA 99 07/26/2019 1212   Anemia, hypokalemia, elevated creatinine, elevated AST and alkaline phosphatase, mild hypocalcemia.  Imaging I have reviewed the images obtained:  CT-scan of the brain-no acute changes.  No bleed.  Assessment:  59 year old man past history of AICD placement because of chronic systolic heart failure, diabetes, hypertension, essential tremor, hypokalemia, alcohol abuse, gait disorder with progressive gait worsening over many years, brought in post CPR. EMS was called for requiring help getting the patient up after a fall but they could not locate a pulse.  CPR was done for about 2 minutes prior to ROSC. Upon awakening, he had some stiffening/tonic posturing.  Versed given. Completely unresponsive after that followed by gradual improvement in mentation. Differentials include: Post cardiac arrest convulsion versus alcohol withdrawal seizure versus syncopal convulsions.  Also toxic metabolic encephalopathy.  Recommendations: Correct toxic metabolic derangements per ER and primary team. Load Keppra 1 g IV now. 1 dose of Ativan 1 mg IV now Routine EEG in the morning. CIWA protocol Check if the pacemaker is MRI compatible and obtain MRI if possible. Do not continue AEDs at this time-final decision will be made after testing is made available. Check ABG Maintain seizure precautions  Plan discussed with EDP in person in the emergency department.  -- Amie Portland, MD [facility] Pager: [phone] If 7pm to 7am, please call on call as listed on AMION.  Addendum ABG with acidosis and hypercarbia. Management per primary team  -- Amie Portland, MD [facility] Pager: [phone] If 7pm to 7am, please call on call as listed on AMION.

## 2019-08-30 NOTE — Progress Notes (Signed)
ANTICOAGULATION CONSULT NOTE - Initial Consult  Pharmacy Consult for Heparin Indication: pulmonary embolus  Allergies  Allergen Reactions  . Penicillins Other (See Comments)    Has patient had a PCN reaction causing immediate rash, facial/tongue/throat swelling, SOB or lightheadedness with hypotension: Yes Has patient had a PCN reaction causing severe rash involving mucus membranes or skin necrosis: Yes Has patient had a PCN reaction that required hospitalization:  No Has patient had a PCN reaction occurring within the last 10 years: No If all of the above answers are "NO", then may proceed with Cephalosporin use..   childhood reaction, swelling & had to be given an anti    Patient Measurements: Height: 6\' 4"  (193 cm) Weight: 258 lb (117 kg) IBW/kg (Calculated) : 86.8 Heparin Dosing Weight: 111.1 kg   Vital Signs: Temp: 98.4 F (36.9 C) (01/15 2044) Temp Source: Oral (01/15 2044) BP: 123/110 (01/15 2200) Pulse Rate: 100 (01/15 2200)  Labs: Recent Labs    08/30/19 2035 08/30/19 2036 08/30/19 2121  HGB  --  16.3 14.6  HCT  --  48.0 43.0  CREATININE 2.03* 1.70*  --   TROPONINIHS 38*  --   --     Estimated Creatinine Clearance: 67.1 mL/min (A) (by C-G formula based on SCr of 1.7 mg/dL (H)).   Medical History: Past Medical History:  Diagnosis Date  . AICD (automatic cardioverter/defibrillator) present   . Arthritis    L hip   . Asthma    pt. uses albuterol inhaler in the spring for enviromental allergies   . Cataract    LEFT EYE  . Chronic systolic heart failure (HCC)    NICM, previous heart transplant candidate with EF 10%  . Diabetes mellitus without complication (HCC)    TYPE 2  . Family history of adverse reaction to anesthesia    sister- headache & N&V am and mother  . History of hiatal hernia   . History of kidney stones   . Hypertension   . Hypokalemia   . Neuromuscular disorder (HCC)    essential tremor  . Osteomyelitis (HCC)    a. MSSA  osteomyelitis of the right shoulder.  . Ventricular fibrillation (HCC)   . Ventricular tachycardia (HCC)     Medications:  Scheduled:  . heparin  6,500 Units Intravenous Once  . LORazepam  0-4 mg Intravenous Q6H   Or  . LORazepam  0-4 mg Oral Q6H  . [START ON 09/02/2019] LORazepam  0-4 mg Intravenous Q12H   Or  . [START ON 09/02/2019] LORazepam  0-4 mg Oral Q12H  . LORazepam  1 mg Intravenous Once  . LORazepam  2 mg Intravenous Once  . potassium chloride SA  40 mEq Oral Once  . thiamine  100 mg Oral Daily   Or  . thiamine  100 mg Intravenous Daily    Assessment: Patient is a 89 yom that presents to the ED after what appears to have been a syncopal event followed by a brief period when the patient was pulseless. The patient arrived talking however was confused. The patients current respiratory status is declining and may need intubation. CTPE study in unable to be done at this time 2/2 current renal function. With the concern for a PE Pharmacy has been asked to dose heparin in this patient.  Goal of Therapy:  Heparin level 0.3-0.7 units/ml Monitor platelets by anticoagulation protocol: Yes   Plan:   - Heparin bolus of 6500 units IV x 1 dose  - Followed by Heparin  drip @ 1850 units/hr  - Heparin level in 6 hours  - Monitor patient for s/s of bleeding and CBC while on heparin.   Duanne Limerick PharmD. BCPS  08/30/2019,10:53 PM

## 2019-08-31 ENCOUNTER — Inpatient Hospital Stay (HOSPITAL_COMMUNITY): Payer: Self-pay

## 2019-08-31 ENCOUNTER — Emergency Department (HOSPITAL_COMMUNITY): Payer: Self-pay

## 2019-08-31 DIAGNOSIS — J9600 Acute respiratory failure, unspecified whether with hypoxia or hypercapnia: Secondary | ICD-10-CM

## 2019-08-31 DIAGNOSIS — R55 Syncope and collapse: Secondary | ICD-10-CM

## 2019-08-31 DIAGNOSIS — I82541 Chronic embolism and thrombosis of right tibial vein: Secondary | ICD-10-CM

## 2019-08-31 DIAGNOSIS — G9340 Encephalopathy, unspecified: Secondary | ICD-10-CM | POA: Diagnosis present

## 2019-08-31 DIAGNOSIS — I82552 Chronic embolism and thrombosis of left peroneal vein: Secondary | ICD-10-CM

## 2019-08-31 LAB — POCT I-STAT 7, (LYTES, BLD GAS, ICA,H+H)
Acid-base deficit: 4 mmol/L — ABNORMAL HIGH (ref 0.0–2.0)
Acid-base deficit: 5 mmol/L — ABNORMAL HIGH (ref 0.0–2.0)
Acid-base deficit: 8 mmol/L — ABNORMAL HIGH (ref 0.0–2.0)
Bicarbonate: 18.6 mmol/L — ABNORMAL LOW (ref 20.0–28.0)
Bicarbonate: 20.3 mmol/L (ref 20.0–28.0)
Bicarbonate: 21 mmol/L (ref 20.0–28.0)
Calcium, Ion: 1.04 mmol/L — ABNORMAL LOW (ref 1.15–1.40)
Calcium, Ion: 1.09 mmol/L — ABNORMAL LOW (ref 1.15–1.40)
Calcium, Ion: 1.14 mmol/L — ABNORMAL LOW (ref 1.15–1.40)
HCT: 34 % — ABNORMAL LOW (ref 39.0–52.0)
HCT: 37 % — ABNORMAL LOW (ref 39.0–52.0)
HCT: 39 % (ref 39.0–52.0)
Hemoglobin: 11.6 g/dL — ABNORMAL LOW (ref 13.0–17.0)
Hemoglobin: 12.6 g/dL — ABNORMAL LOW (ref 13.0–17.0)
Hemoglobin: 13.3 g/dL (ref 13.0–17.0)
O2 Saturation: 91 %
O2 Saturation: 93 %
O2 Saturation: 99 %
Patient temperature: 37.4
Patient temperature: 98.4
Patient temperature: 98.6
Potassium: 2.4 mmol/L — CL (ref 3.5–5.1)
Potassium: 2.5 mmol/L — CL (ref 3.5–5.1)
Potassium: 2.7 mmol/L — CL (ref 3.5–5.1)
Sodium: 141 mmol/L (ref 135–145)
Sodium: 141 mmol/L (ref 135–145)
Sodium: 143 mmol/L (ref 135–145)
TCO2: 20 mmol/L — ABNORMAL LOW (ref 22–32)
TCO2: 21 mmol/L — ABNORMAL LOW (ref 22–32)
TCO2: 22 mmol/L (ref 22–32)
pCO2 arterial: 36.7 mmHg (ref 32.0–48.0)
pCO2 arterial: 38.2 mmHg (ref 32.0–48.0)
pCO2 arterial: 39.4 mmHg (ref 32.0–48.0)
pH, Arterial: 7.283 — ABNORMAL LOW (ref 7.350–7.450)
pH, Arterial: 7.351 (ref 7.350–7.450)
pH, Arterial: 7.351 (ref 7.350–7.450)
pO2, Arterial: 121 mmHg — ABNORMAL HIGH (ref 83.0–108.0)
pO2, Arterial: 68 mmHg — ABNORMAL LOW (ref 83.0–108.0)
pO2, Arterial: 71 mmHg — ABNORMAL LOW (ref 83.0–108.0)

## 2019-08-31 LAB — TROPONIN I (HIGH SENSITIVITY): Troponin I (High Sensitivity): 38 ng/L — ABNORMAL HIGH (ref <18)

## 2019-08-31 LAB — BASIC METABOLIC PANEL
Anion gap: 19 — ABNORMAL HIGH (ref 5–15)
Anion gap: 18 — ABNORMAL HIGH (ref 5–15)
Anion gap: 17 — ABNORMAL HIGH (ref 5–15)
Anion gap: 15 (ref 5–15)
Anion gap: 16 — ABNORMAL HIGH (ref 5–15)
BUN: 27 mg/dL — ABNORMAL HIGH (ref 6–20)
BUN: 24 mg/dL — ABNORMAL HIGH (ref 6–20)
BUN: 26 mg/dL — ABNORMAL HIGH (ref 6–20)
BUN: 26 mg/dL — ABNORMAL HIGH (ref 6–20)
BUN: 24 mg/dL — ABNORMAL HIGH (ref 6–20)
CO2: 20 mmol/L — ABNORMAL LOW (ref 22–32)
CO2: 20 mmol/L — ABNORMAL LOW (ref 22–32)
CO2: 18 mmol/L — ABNORMAL LOW (ref 22–32)
CO2: 18 mmol/L — ABNORMAL LOW (ref 22–32)
CO2: 16 mmol/L — ABNORMAL LOW (ref 22–32)
Calcium: 8.1 mg/dL — ABNORMAL LOW (ref 8.9–10.3)
Calcium: 7.3 mg/dL — ABNORMAL LOW (ref 8.9–10.3)
Calcium: 7.4 mg/dL — ABNORMAL LOW (ref 8.9–10.3)
Calcium: 7.8 mg/dL — ABNORMAL LOW (ref 8.9–10.3)
Calcium: 7.5 mg/dL — ABNORMAL LOW (ref 8.9–10.3)
Chloride: 103 mmol/L (ref 98–111)
Chloride: 101 mmol/L (ref 98–111)
Chloride: 107 mmol/L (ref 98–111)
Chloride: 105 mmol/L (ref 98–111)
Chloride: 106 mmol/L (ref 98–111)
Creatinine, Ser: 1.62 mg/dL — ABNORMAL HIGH (ref 0.61–1.24)
Creatinine, Ser: 1.4 mg/dL — ABNORMAL HIGH (ref 0.61–1.24)
Creatinine, Ser: 1.71 mg/dL — ABNORMAL HIGH (ref 0.61–1.24)
Creatinine, Ser: 1.6 mg/dL — ABNORMAL HIGH (ref 0.61–1.24)
Creatinine, Ser: 1.56 mg/dL — ABNORMAL HIGH (ref 0.61–1.24)
GFR calc Af Amer: 54 mL/min — ABNORMAL LOW (ref >60)
GFR calc Af Amer: >60 mL/min (ref >60)
GFR calc Af Amer: 50 mL/min — ABNORMAL LOW (ref >60)
GFR calc Af Amer: 55 mL/min — ABNORMAL LOW (ref >60)
GFR calc Af Amer: 56 mL/min — ABNORMAL LOW (ref >60)
GFR calc non Af Amer: 46 mL/min — ABNORMAL LOW (ref >60)
GFR calc non Af Amer: 55 mL/min — ABNORMAL LOW (ref >60)
GFR calc non Af Amer: 43 mL/min — ABNORMAL LOW (ref >60)
GFR calc non Af Amer: 47 mL/min — ABNORMAL LOW (ref >60)
GFR calc non Af Amer: 49 mL/min — ABNORMAL LOW (ref >60)
Glucose, Bld: 64 mg/dL — ABNORMAL LOW (ref 70–99)
Glucose, Bld: 67 mg/dL — ABNORMAL LOW (ref 70–99)
Glucose, Bld: 87 mg/dL (ref 70–99)
Glucose, Bld: 111 mg/dL — ABNORMAL HIGH (ref 70–99)
Glucose, Bld: 88 mg/dL (ref 70–99)
Potassium: 2.6 mmol/L — CL (ref 3.5–5.1)
Potassium: 3 mmol/L — ABNORMAL LOW (ref 3.5–5.1)
Potassium: 2.7 mmol/L — CL (ref 3.5–5.1)
Potassium: 3.2 mmol/L — ABNORMAL LOW (ref 3.5–5.1)
Potassium: 2.9 mmol/L — ABNORMAL LOW (ref 3.5–5.1)
Sodium: 142 mmol/L (ref 135–145)
Sodium: 139 mmol/L (ref 135–145)
Sodium: 142 mmol/L (ref 135–145)
Sodium: 138 mmol/L (ref 135–145)
Sodium: 138 mmol/L (ref 135–145)

## 2019-08-31 LAB — CBC WITH DIFFERENTIAL/PLATELET
Abs Immature Granulocytes: 0.07 10*3/uL (ref 0.00–0.07)
Basophils Absolute: 0 10*3/uL (ref 0.0–0.1)
Basophils Relative: 0 %
Eosinophils Absolute: 0.1 10*3/uL (ref 0.0–0.5)
Eosinophils Relative: 1 %
HCT: 46.8 % (ref 39.0–52.0)
Hemoglobin: 13.8 g/dL (ref 13.0–17.0)
Immature Granulocytes: 1 %
Lymphocytes Relative: 7 %
Lymphs Abs: 0.6 10*3/uL — ABNORMAL LOW (ref 0.7–4.0)
MCH: 31 pg (ref 26.0–34.0)
MCHC: 29.5 g/dL — ABNORMAL LOW (ref 30.0–36.0)
MCV: 105.2 fL — ABNORMAL HIGH (ref 80.0–100.0)
Monocytes Absolute: 0.8 10*3/uL (ref 0.1–1.0)
Monocytes Relative: 8 %
Neutro Abs: 8 10*3/uL — ABNORMAL HIGH (ref 1.7–7.7)
Neutrophils Relative %: 83 %
Platelets: 124 10*3/uL — ABNORMAL LOW (ref 150–400)
RBC: 4.45 MIL/uL (ref 4.22–5.81)
RDW: 16.6 % — ABNORMAL HIGH (ref 11.5–15.5)
WBC: 9.6 10*3/uL (ref 4.0–10.5)
nRBC: 0 % (ref 0.0–0.2)

## 2019-08-31 LAB — URINALYSIS, ROUTINE W REFLEX MICROSCOPIC
Bacteria, UA: NONE SEEN
Bilirubin Urine: NEGATIVE
Glucose, UA: NEGATIVE mg/dL
Hgb urine dipstick: NEGATIVE
Ketones, ur: 20 mg/dL — AB
Leukocytes,Ua: NEGATIVE
Nitrite: NEGATIVE
Protein, ur: 30 mg/dL — AB
Specific Gravity, Urine: 1.013 (ref 1.005–1.030)
pH: 7 (ref 5.0–8.0)

## 2019-08-31 LAB — HEPARIN LEVEL (UNFRACTIONATED)
Heparin Unfractionated: 1.28 IU/mL — ABNORMAL HIGH (ref 0.30–0.70)
Heparin Unfractionated: 0.5 IU/mL (ref 0.30–0.70)
Heparin Unfractionated: 0.46 IU/mL (ref 0.30–0.70)

## 2019-08-31 LAB — RAPID URINE DRUG SCREEN, HOSP PERFORMED
Amphetamines: NOT DETECTED
Barbiturates: NOT DETECTED
Benzodiazepines: POSITIVE — AB
Cocaine: NOT DETECTED
Opiates: NOT DETECTED
Tetrahydrocannabinol: NOT DETECTED

## 2019-08-31 LAB — TSH: TSH: 5.69 u[IU]/mL — ABNORMAL HIGH (ref 0.350–4.500)

## 2019-08-31 LAB — ECHOCARDIOGRAM COMPLETE
Height: 76 in
Weight: 4128 oz

## 2019-08-31 LAB — AMMONIA: Ammonia: 68 umol/L — ABNORMAL HIGH (ref 9–35)

## 2019-08-31 LAB — PROCALCITONIN: Procalcitonin: 0.32 ng/mL

## 2019-08-31 LAB — GLUCOSE, CAPILLARY
Glucose-Capillary: 125 mg/dL — ABNORMAL HIGH (ref 70–99)
Glucose-Capillary: 48 mg/dL — ABNORMAL LOW (ref 70–99)
Glucose-Capillary: 71 mg/dL (ref 70–99)
Glucose-Capillary: 73 mg/dL (ref 70–99)
Glucose-Capillary: 85 mg/dL (ref 70–99)
Glucose-Capillary: 87 mg/dL (ref 70–99)
Glucose-Capillary: 96 mg/dL (ref 70–99)

## 2019-08-31 LAB — D-DIMER, QUANTITATIVE: D-Dimer, Quant: 1.57 ug/mL-FEU — ABNORMAL HIGH (ref 0.00–0.50)

## 2019-08-31 LAB — LIPASE, BLOOD: Lipase: 60 U/L — ABNORMAL HIGH (ref 11–51)

## 2019-08-31 LAB — LACTIC ACID, PLASMA
Lactic Acid, Venous: 1.3 mmol/L (ref 0.5–1.9)
Lactic Acid, Venous: 1.1 mmol/L (ref 0.5–1.9)

## 2019-08-31 LAB — MAGNESIUM: Magnesium: 1.9 mg/dL (ref 1.7–2.4)

## 2019-08-31 LAB — VITAMIN B12: Vitamin B-12: 334 pg/mL (ref 180–914)

## 2019-08-31 LAB — PHOSPHORUS: Phosphorus: 3.8 mg/dL (ref 2.5–4.6)

## 2019-08-31 LAB — CK: Total CK: 215 U/L (ref 49–397)

## 2019-08-31 MED ORDER — LORAZEPAM 2 MG/ML IJ SOLN
2.0000 mg | Freq: Four times a day (QID) | INTRAMUSCULAR | Status: AC
  Administered 2019-08-31 – 2019-09-01 (×2): 2 mg via INTRAVENOUS
  Filled 2019-08-31 (×2): qty 1

## 2019-08-31 MED ORDER — DEXTROSE 50 % IV SOLN
INTRAVENOUS | Status: AC
  Administered 2019-08-31: 03:00:00 50 mL
  Filled 2019-08-31: qty 50

## 2019-08-31 MED ORDER — SODIUM CHLORIDE 0.9 % IV BOLUS
500.0000 mL | Freq: Once | INTRAVENOUS | Status: AC
  Administered 2019-08-31: 04:00:00 500 mL via INTRAVENOUS

## 2019-08-31 MED ORDER — POTASSIUM CHLORIDE 10 MEQ/100ML IV SOLN
10.0000 meq | INTRAVENOUS | Status: AC
  Administered 2019-08-31: 01:00:00 10 meq via INTRAVENOUS

## 2019-08-31 MED ORDER — CEFAZOLIN SODIUM-DEXTROSE 2-4 GM/100ML-% IV SOLN
2.0000 g | Freq: Three times a day (TID) | INTRAVENOUS | Status: DC
  Administered 2019-08-31 – 2019-09-01 (×5): 2 g via INTRAVENOUS
  Filled 2019-08-31 (×6): qty 100

## 2019-08-31 MED ORDER — FUROSEMIDE 10 MG/ML IJ SOLN
20.0000 mg | Freq: Once | INTRAMUSCULAR | Status: AC
  Administered 2019-08-31: 03:00:00 20 mg via INTRAVENOUS
  Filled 2019-08-31: qty 2

## 2019-08-31 MED ORDER — DEXMEDETOMIDINE HCL IN NACL 400 MCG/100ML IV SOLN
0.4000 ug/kg/h | INTRAVENOUS | Status: DC
  Administered 2019-08-31: 08:00:00 0.6 ug/kg/h via INTRAVENOUS
  Administered 2019-08-31: 16:00:00 0.5 ug/kg/h via INTRAVENOUS
  Administered 2019-08-31: 11:00:00 0.7 ug/kg/h via INTRAVENOUS
  Administered 2019-09-01 (×2): 0.4 ug/kg/h via INTRAVENOUS
  Administered 2019-09-01: 20:00:00 0.5 ug/kg/h via INTRAVENOUS
  Administered 2019-09-02: 04:00:00 0.6 ug/kg/h via INTRAVENOUS
  Filled 2019-08-31 (×6): qty 100

## 2019-08-31 MED ORDER — FOLIC ACID 1 MG PO TABS
1.0000 mg | ORAL_TABLET | Freq: Every day | ORAL | Status: DC
  Administered 2019-08-31 – 2019-09-04 (×5): 1 mg
  Filled 2019-08-31 (×5): qty 1

## 2019-08-31 MED ORDER — HEPARIN (PORCINE) 25000 UT/250ML-% IV SOLN
1600.0000 [IU]/h | INTRAVENOUS | Status: DC
  Administered 2019-08-31 – 2019-09-01 (×2): 1600 [IU]/h via INTRAVENOUS
  Filled 2019-08-31 (×2): qty 250

## 2019-08-31 MED ORDER — MAGNESIUM SULFATE 2 GM/50ML IV SOLN
2.0000 g | Freq: Once | INTRAVENOUS | Status: AC
  Administered 2019-08-31: 03:00:00 2 g via INTRAVENOUS
  Filled 2019-08-31: qty 50

## 2019-08-31 MED ORDER — MIDAZOLAM HCL 2 MG/2ML IJ SOLN
1.0000 mg | INTRAMUSCULAR | Status: DC | PRN
  Administered 2019-08-31 (×2): 1 mg via INTRAVENOUS
  Filled 2019-08-31 (×3): qty 2

## 2019-08-31 MED ORDER — SODIUM CHLORIDE 0.9 % IV SOLN: 2.0000 g | Freq: Once | INTRAVENOUS | Status: DC

## 2019-08-31 MED ORDER — VANCOMYCIN HCL 2000 MG/400ML IV SOLN
2000.0000 mg | Freq: Once | INTRAVENOUS | Status: AC
  Administered 2019-08-31: 2000 mg via INTRAVENOUS
  Filled 2019-08-31: qty 400

## 2019-08-31 MED ORDER — POTASSIUM CHLORIDE 10 MEQ/100ML IV SOLN
10.0000 meq | INTRAVENOUS | Status: AC
  Administered 2019-08-31 (×6): 10 meq via INTRAVENOUS
  Filled 2019-08-31 (×6): qty 100

## 2019-08-31 MED ORDER — CALCIUM GLUCONATE-NACL 1-0.675 GM/50ML-% IV SOLN
1.0000 g | Freq: Once | INTRAVENOUS | Status: AC
  Administered 2019-08-31: 03:00:00 1000 mg via INTRAVENOUS
  Filled 2019-08-31: qty 50

## 2019-08-31 MED ORDER — CALCIUM GLUCONATE-NACL 1-0.675 GM/50ML-% IV SOLN
1.0000 g | Freq: Once | INTRAVENOUS | Status: AC
  Administered 2019-08-31: 10:00:00 1000 mg via INTRAVENOUS
  Filled 2019-08-31: qty 50

## 2019-08-31 MED ORDER — POTASSIUM CHLORIDE 10 MEQ/100ML IV SOLN
10.0000 meq | INTRAVENOUS | Status: AC
  Administered 2019-08-31 (×4): 10 meq via INTRAVENOUS
  Filled 2019-08-31 (×4): qty 100

## 2019-08-31 MED ORDER — SODIUM CHLORIDE 0.9 % IV SOLN
INTRAVENOUS | Status: DC
  Administered 2019-09-01: 23:00:00 950 mL via INTRAVENOUS

## 2019-08-31 MED ORDER — ORAL CARE MOUTH RINSE
15.0000 mL | OROMUCOSAL | Status: DC
  Administered 2019-08-31 – 2019-09-01 (×11): 15 mL via OROMUCOSAL

## 2019-08-31 MED ORDER — CHLORHEXIDINE GLUCONATE CLOTH 2 % EX PADS
6.0000 | MEDICATED_PAD | Freq: Every day | CUTANEOUS | Status: DC
  Administered 2019-08-31 – 2019-09-06 (×6): 6 via TOPICAL

## 2019-08-31 MED ORDER — POTASSIUM CHLORIDE 20 MEQ/15ML (10%) PO SOLN
20.0000 meq | Freq: Two times a day (BID) | ORAL | Status: AC
  Administered 2019-08-31 (×2): 20 meq via ORAL
  Filled 2019-08-31 (×2): qty 15

## 2019-08-31 MED ORDER — LORAZEPAM 2 MG/ML IJ SOLN: 1.0000 mg | Freq: Four times a day (QID) | INTRAMUSCULAR | Status: DC

## 2019-08-31 MED ORDER — AMIODARONE HCL 200 MG PO TABS
200.0000 mg | ORAL_TABLET | Freq: Every day | ORAL | Status: DC
  Administered 2019-08-31 – 2019-09-10 (×11): 200 mg via ORAL
  Filled 2019-08-31 (×11): qty 1

## 2019-08-31 MED ORDER — ADULT MULTIVITAMIN W/MINERALS CH
1.0000 | ORAL_TABLET | Freq: Every day | ORAL | Status: DC
  Administered 2019-08-31 – 2019-09-04 (×5): 1
  Filled 2019-08-31 (×5): qty 1

## 2019-08-31 MED ORDER — POTASSIUM CHLORIDE 20 MEQ/15ML (10%) PO SOLN: 20.0000 meq | Freq: Three times a day (TID) | ORAL | Status: DC

## 2019-08-31 MED ORDER — LACTATED RINGERS IV BOLUS
1000.0000 mL | Freq: Once | INTRAVENOUS | Status: AC
  Administered 2019-08-31: 11:00:00 1000 mL via INTRAVENOUS

## 2019-08-31 MED ORDER — CHLORHEXIDINE GLUCONATE 0.12% ORAL RINSE (MEDLINE KIT)
15.0000 mL | Freq: Two times a day (BID) | OROMUCOSAL | Status: DC
  Administered 2019-08-31 – 2019-09-01 (×3): 15 mL via OROMUCOSAL

## 2019-08-31 MED ORDER — SODIUM CHLORIDE 0.9 % IV BOLUS
250.0000 mL | Freq: Once | INTRAVENOUS | Status: AC
  Administered 2019-08-31: 05:00:00 250 mL via INTRAVENOUS

## 2019-08-31 MED ORDER — MAGNESIUM SULFATE IN D5W 1-5 GM/100ML-% IV SOLN
1.0000 g | Freq: Once | INTRAVENOUS | Status: AC
  Administered 2019-08-31: 1 g via INTRAVENOUS
  Filled 2019-08-31: qty 100

## 2019-08-31 MED ORDER — CARVEDILOL 3.125 MG PO TABS
3.1250 mg | ORAL_TABLET | Freq: Two times a day (BID) | ORAL | Status: DC
  Filled 2019-08-31: qty 1

## 2019-08-31 MED ORDER — PANTOPRAZOLE SODIUM 40 MG IV SOLR
40.0000 mg | Freq: Every day | INTRAVENOUS | Status: DC
  Administered 2019-08-31 – 2019-09-01 (×2): 40 mg via INTRAVENOUS
  Filled 2019-08-31 (×2): qty 40

## 2019-08-31 NOTE — H&P (Addendum)
NAME:  Justin Bernard, MRN:  [id number], DOB:  Jan 08, 1961, LOS: 0 ADMISSION DATE:  08/30/2019, CONSULTATION DATE:  08/31/19 REFERRING MD:  Regenia Skeeter, CHIEF COMPLAINT:  Syncope   Brief History   59 y/o with  PMH of NICM, HFrEF, vtach/vfib with AICD and ETOH abuse who fell at home, EMS called and did not feel a pulse, ROSC with 2 minutes CPR and then concern for stiffening and posturing.  Pt was hypoxic and confused in the ED and then intubated.  Head CT negative and PCCM consulted for admission.  History of present illness   59 y.o. M with PMH of NICM, HFrEF, vtach/vfib with AICD and ETOH abuse who presents with a fall out of a chair at home, per report fall from standing.  When EMS arrived, there was concern for loss of pulses and about 2 minutes of CPR performed with ROSC and then stiffening and posturing concerning for seizure.  Pt in sinus rhythm on arrival to ED and awake though confused.  Became increasingly agitated despite Ativan.  He was hypoxic into 70%'s requiring facemask oxygen.  Head CT was negative and pt was intubated for hypoxia and agitation.    Work-up in the ED was significant for significant hypokalemia of 2.5, creatinine 2.03, mildly elevated transaminases with bilirubin of 2.0, troponin 38 and ETOH level <10.  Empiric heparin ordered in the setting of syncope and hypoxia for empiric treatment of PE.   [place] pacemaker was interrogated and no episodes of arrhythmia found.  Pt was also evaluated by neurology and loaded with Keppra.  Pt also has a history of chronic R should osteomyelitis and purulent drainage noted.  Afebrile without leukocytosis.   Past Medical History   has a past medical history of AICD (automatic cardioverter/defibrillator) present, Arthritis, Asthma, Cataract, Chronic systolic heart failure ([place]), Diabetes mellitus without complication ([place]), Family history of adverse reaction to anesthesia, History of hiatal hernia, History of kidney stones,  Hypertension, Hypokalemia, Neuromuscular disorder ([place]), Osteomyelitis ([place]), Ventricular fibrillation ([place]), and Ventricular tachycardia ([place]).   Significant Hospital Events   1/16 Admit to PCCM  Consults:  Neurology  Procedures:  1/16 ETT  Significant Diagnostic Tests:  1/15 CT head>>no acute findings  Micro Data:  1/15 BCx2>> 1/15 Covid-19 and influenza>>negative 1/15 UC>>   Antimicrobials:   Ancef 1/16-  Interim history/subjective:  Pt arrived to the ED awake and speaking, though confused and became increasingly agitated throughout ED course with oxygen saturations in the 70%'s  Objective   Blood pressure 116/69, pulse 98, temperature 98.4 F (36.9 C), temperature source Oral, resp. rate 20, height 6' 4" (1.93 m), weight 117 kg, SpO2 100 %.    Vent Mode: PRVC FiO2 (%):  [50 %-60 %] 60 % Set Rate:  [20 bmp] 20 bmp Vt Set:  [062 mL] 690 mL PEEP:  [5 cmH20] 5 cmH20 Plateau Pressure:  [17 cmH20] 17 cmH20  No intake or output data in the 24 hours ending 08/31/19 0038 Filed Weights   08/30/19 2056  Weight: 117 kg   General:  Obese M, intubated and sedated HEENT: MM pink/moist, ETT in place Neuro: sedated after intubation, withdraws to pain CV: s1s2 rrr, no m/r/g PULM:  ETT in place and decreased breath sounds in the bilateral bases GI: soft, bsx4 active  Extremities: warm/dry, no edema  Skin: lesion of the R shoulder without significant erythema or induration   Resolved Hospital Problem list   none  Assessment & Plan:   Fall with reported syncopal  episode and possible cardiac arrest, possible seizure activity and encephalopathy -Story of initiating event is not entirely clear, unable to reach family members to offer additional history -Per ED staff, family reported that patient was binge drinking several days ago which may support alcohol withdrawal and seizure as the etiology of today's events -Patient has also had falls associated with heart failure and  arrhythmia, no tachyarrhythmia when AICD interrogated in the ED and troponin is very mildly elevated  P: -Appreciate neurology recommendations, loaded with Keppra plan for EEG in the morning and MRI if AICD is compatible -Trend troponin, check echo -Encephalopathy possibly due to EtOH vs hepatic or metabolic -Check urine drug screen and ammonia   Acute hypoxic respiratory failure -Chest x-ray with mild vascular congestion, Covid-19  -Intubated in the ED for combination of increasing agitation and hypoxia P: - continue empiric heparin for possible PE, consider VQ scan-cannot obtain CTA secondary to AKI -Maintain full vent support with SAT/SBT as tolerated -titrate Vent setting to maintain SpO2 greater than or equal to 90%. -HOB elevated 30 degrees. -Plateau pressures less than 30 cm H20.  -Follow chest x-ray, ABG prn.   -Bronchial hygiene and RT/bronchodilator protocol.   Acute kidney injury -Creatinine 2.0, baseline less than 1 P: -Place Foley, UA pending -Trend metabolic panel and avoid nephrotoxins -Replete electrolytes potassium, magnesium, calcium   Alcohol abuse with mildly elevated transaminases -Check ammonia level and continue to monitor, check B12 level and lipase -Trend LFTs sedated with propofol, may need CIWA and resources for substance abuse once extubated -Replete thiamine    NICM, HTN -Last echo in 05/2019 with EF of 40 to 45% -EKG sinus rhythm on arrival P: -Repeat echo, give Lasix 20 mg x 1 for elevated BNP and vascular congestion on chest x-ray -Continue home amiodarone and Coreg -Repeat echo -Consider cardiology consult  Hypokalemia -History of chronic hypokalemia -Received 69 M EQ's in the ED P: -Give additional 10 M EQ IV x4 and follow every 4 BMPs   Chronic right shoulder wound -No evidence of osteo or cellulitis or purulent drainage noted in the ED P: -Obtain blood cultures and treat with Ancef  Best practice:  Diet:  N.p.o. Pain/Anxiety/Delirium protocol (if indicated): Propofol VAP protocol (if indicated): Yes DVT prophylaxis: Heparin GI prophylaxis: Protonix Glucose control: SSI Mobility: Bedrest Code Status: Full Family Communication: Unable to either contact listed Disposition: ICU  Labs   CBC: Recent Labs  Lab 08/30/19 2036 08/30/19 2121  HGB 16.3 14.6  HCT 48.0 72.6    Basic Metabolic Panel: Recent Labs  Lab 08/30/19 2035 08/30/19 2036 08/30/19 2121  NA 140 139 139  K 2.8* 2.5* 2.4*  CL 97* 100  --   CO2 22  --   --   GLUCOSE 94 91  --   BUN 28* 30*  --   CREATININE 2.03* 1.70*  --   CALCIUM 8.6*  --   --   MG 2.0  --   --    GFR: Estimated Creatinine Clearance: 67.1 mL/min (A) (by C-G formula based on SCr of 1.7 mg/dL (H)). No results for input(s): PROCALCITON, WBC, LATICACIDVEN in the last 168 hours.  Liver Function Tests: Recent Labs  Lab 08/30/19 2035  AST 129*  ALT 48*  ALKPHOS 183*  BILITOT 2.0*  PROT 7.0  ALBUMIN 3.7   No results for input(s): LIPASE, AMYLASE in the last 168 hours. No results for input(s): AMMONIA in the last 168 hours.  ABG    Component Value Date/Time  PHART 7.228 (L) 08/30/2019 2121   PCO2ART 62.0 (H) 08/30/2019 2121   PO2ART 102.0 08/30/2019 2121   HCO3 25.9 08/30/2019 2121   TCO2 28 08/30/2019 2121   ACIDBASEDEF 3.0 (H) 08/30/2019 2121   O2SAT 96.0 08/30/2019 2121     Coagulation Profile: No results for input(s): INR, PROTIME in the last 168 hours.  Cardiac Enzymes: No results for input(s): CKTOTAL, CKMB, CKMBINDEX, TROPONINI in the last 168 hours.  HbA1C: Hgb A1c MFr Bld  Date/Time Value Ref Range Status  02/14/2017 03:30 PM 4.9 4.8 - 5.6 % Final    Comment:    (NOTE)         Pre-diabetes: 5.7 - 6.4         Diabetes: >6.4         Glycemic control for adults with diabetes: <7.0   04/29/2016 09:59 AM 5.0 4.8 - 5.6 % Final    Comment:    (NOTE)         Pre-diabetes: 5.7 - 6.4         Diabetes: >6.4          Glycemic control for adults with diabetes: <7.0     CBG: Recent Labs  Lab 08/30/19 2056  GLUCAP 88    Review of Systems:   Unable to obtain secondary to sedated and intubated  Past Medical History  He,  has a past medical history of AICD (automatic cardioverter/defibrillator) present, Arthritis, Asthma, Cataract, Chronic systolic heart failure (HCC), Diabetes mellitus without complication (HCC), Family history of adverse reaction to anesthesia, History of hiatal hernia, History of kidney stones, Hypertension, Hypokalemia, Neuromuscular disorder (HCC), Osteomyelitis (HCC), Ventricular fibrillation (HCC), and Ventricular tachycardia (HCC).   Surgical History    Past Surgical History:  Procedure Laterality Date   BACK SURGERY  07/2000   fusion , [place]  LOWER   BONE EXCISION Right 04/29/2016   Procedure: EXCISION HETEROTOPIC BONE RIGHT HUMERUS;  Surgeon: Michael Handy, MD;  Location: [facility] OR;  Service: Orthopedics;  Laterality: Right;   CARDIAC CATHETERIZATION  08/2012   CYSTOSCOPY/URETEROSCOPY/HOLMIUM LASER/STENT PLACEMENT Bilateral 04/03/2017   Procedure: CYSTOSCOPY/URETEROSCOPY/HOLMIUM LASER/LEFT STENT REPLACEMENT AND RIGHT   STENT PLACEMENT;  Surgeon: Budzyn, Brian James, MD;  Location: [facility];  Service: Urology;  Laterality: Bilateral;   FRACTURE SURGERY Left    hip , r shoulder    HARDWARE REMOVAL Right 04/29/2016   Procedure: HARDWARE REMOVAL;  Surgeon: Michael Handy, MD;  Location: [facility] OR;  Service: Orthopedics;  Laterality: Right;   HERNIA REPAIR Right    inguinal    HIP SURGERY Left YRS AGO   hardware present, for slipped joint    HOLMIUM LASER APPLICATION Bilateral 04/03/2017   Procedure: HOLMIUM LASER APPLICATION;  Surgeon: Budzyn, Brian James, MD;  Location: [facility];  Service: Urology;  Laterality: Bilateral;   ICD  2012   [employer]   IR URETERAL STENT LEFT NEW ACCESS W/O SEP NEPHROSTOMY CATH  02/20/2017   NEPHROLITHOTOMY Left 02/20/2017   Procedure:  NEPHROLITHOTOMY PERCUTANEOUS;  Surgeon: Budzyn, Brian James, MD;  Location: [facility];  Service: Urology;  Laterality: Left;   ORIF HUMERUS FRACTURE Right 07/20/2015   Procedure: OPEN REDUCTION INTERNAL FIXATION (ORIF) PROXIMAL HUMERUS FRACTURE;  Surgeon: Steve Norris, MD;  Location: [facility] OR;  Service: Orthopedics;  Laterality: Right;   TONSILLECTOMY  AS CHILD   ulnar surgery Right      Social History   reports that he has never smoked. He has never used smokeless tobacco. He reports that he   does not drink alcohol or use drugs.   Family History   His family history includes CAD in his father and another family member; Heart failure in his mother; Hypertension in his mother and another family member.   Allergies Allergies  Allergen Reactions   Penicillins Other (See Comments)    Has patient had a PCN reaction causing immediate rash, facial/tongue/throat swelling, SOB or lightheadedness with hypotension: Yes Has patient had a PCN reaction causing severe rash involving mucus membranes or skin necrosis: Yes Has patient had a PCN reaction that required hospitalization:  No Has patient had a PCN reaction occurring within the last 10 years: No If all of the above answers are "NO", then may proceed with Cephalosporin use..   childhood reaction, swelling & had to be given an anti     Home Medications  Prior to Admission medications   Medication Sig Start Date End Date Taking? Authorizing Provider  albuterol (PROVENTIL HFA;VENTOLIN HFA) 108 (90 Base) MCG/ACT inhaler Inhale 2 puffs into the lungs every 6 (six) hours as needed for wheezing or shortness of breath.   Yes [provider]  amiodarone (PACERONE) 200 MG tablet Take 1 tablet (200 mg total) by mouth daily. 07/26/19  Yes [name] Friar, PA-C  carvedilol (COREG) 3.125 MG tablet Take 1 tablet (3.125 mg total) by mouth 2 (two) times daily with a meal. 05/18/19  Yes Dunn, Dayna N, PA-C  hydrocortisone cream 1 % Apply 1 application  topically 2 (two) times daily as needed for itching.   Yes [provider]  Magnesium Oxide 400 (240 Mg) MG TABS Take 1 tablet (400 mg total) by mouth 2 (two) times daily. 05/31/19  Yes Dunn, Dayna N, PA-C  Multiple Vitamin (MULTIVITAMIN WITH MINERALS) TABS tablet Take 1 tablet by mouth daily. 08/22/16  Yes Elgergawy, Silver Huguenin, MD  potassium chloride SA (KLOR-CON) 20 MEQ tablet Take 2 tablets by mouth in the a.m,, take 1 tablet by mouth in the afternoon 05/31/19  Yes Dunn, Dayna N, PA-C  spironolactone (ALDACTONE) 25 MG tablet Take 0.5 tablets (12.5 mg total) by mouth daily. 06/04/19  Yes Larey Dresser, MD  glucose monitoring kit (FREESTYLE) monitoring kit 1 each by Does not apply route 4 (four) times daily - after meals and at bedtime. 1 month Diabetic Testing Supplies for QAC-QHS accuchecks. 07/23/15   Ghimire, Henreitta Leber, MD     Critical care time: 70 minutes   Patient seen in concert with NP Gleason, critical care plan discussed at length, agree with above assessment and plan.  Dr Mariane Masters MD  CRITICAL CARE Performed by: Otilio Carpen Gleason   Total critical care time: 70 minutes  Critical care time was exclusive of separately billable procedures and treating other patients.  Critical care was necessary to treat or prevent imminent or life-threatening deterioration.  Critical care was time spent personally by me on the following activities: development of treatment plan with patient and/or surrogate as well as nursing, discussions with consultants, evaluation of patient's response to treatment, examination of patient, obtaining history from patient or surrogate, ordering and performing treatments and interventions, ordering and review of laboratory studies, ordering and review of radiographic studies, pulse oximetry and re-evaluation of patient's condition.  Otilio Carpen Gleason, Danae Chen PCCM  PCCM pager [phone]

## 2019-08-31 NOTE — Progress Notes (Signed)
eLink Physician-Brief Progress Note Patient Name: Justin Bernard DOB: Oct 18, 1960 MRN: [id number]   Date of Service  08/31/2019  HPI/Events of Note  Pt with acute respiratory failure, suspected seizures, transient cardiac arrest, and a past history cardiomyopathy, heart failure with reduced ejection fraction, and malignant arrhythmias s/p AICD in the past.  eICU Interventions  New Patient Evaluation completed. Pt is currently vented.        Thomasene Lot Ogan 08/31/2019, 2:55 AM

## 2019-08-31 NOTE — Progress Notes (Signed)
NAME:  Justin Bernard, MRN:  [id number], DOB:  Dec 23, 1960, LOS: 0 ADMISSION DATE:  08/30/2019, CONSULTATION DATE:  08/31/19 REFERRING MD:  Regenia Skeeter, CHIEF COMPLAINT:  Syncope   Brief History   59 y/o with  PMH of NICM, HFrEF, vtach/vfib with AICD and ETOH abuse who fell at home, EMS called and did not feel a pulse, ROSC with 2 minutes CPR and then concern for stiffening and posturing.  Pt was hypoxic and confused in the ED and then intubated.  Head CT negative and PCCM consulted for admission.  Empiric heparin ordered in the setting of syncope and hypoxia for empiric treatment of PE.   [place] pacemaker was interrogated and no episodes of arrhythmia found.  Pt was also evaluated by neurology and loaded with Keppra.  Pt also has a history of chronic R should osteomyelitis and purulent drainage noted.  Afebrile without leukocytosis low concern for osteomyelitis.   Past Medical History   has a past medical history of AICD (automatic cardioverter/defibrillator) present, Arthritis, Asthma, Cataract, Chronic systolic heart failure ([place]), Diabetes mellitus without complication ([place]), Family history of adverse reaction to anesthesia, History of hiatal hernia, History of kidney stones, Hypertension, Hypokalemia, Neuromuscular disorder ([place]), Osteomyelitis ([place]), Ventricular fibrillation ([place]), and Ventricular tachycardia ([place]).   Significant Hospital Events   1/16 Admit to PCCM, empiric heparin for presumed PE, keppra load for possible seizure activity 1/17 agitated lower bp, added precedex and decreased propofol Consults:  Neurology  Procedures:  1/16 ETT  Significant Diagnostic Tests:  1/15 CT head>>no acute findings  Micro Data:  1/15 BCx2>> 1/15 Covid-19 and influenza>>negative 1/15 UC>>   Antimicrobials:   Ancef 1/16-  Interim history/subjective:  Agitation overnight coming down on propofol for low bp  Objective   Blood pressure (!) 87/48, pulse 75, temperature 98.8  F (37.1 C), resp. rate (!) 23, height '6\' 4"'$  (1.93 m), weight 117 kg, SpO2 100 %.    Vent Mode: PRVC FiO2 (%):  [40 %-60 %] 50 % Set Rate:  [12 bmp-20 bmp] 20 bmp Vt Set:  [690 mL] 690 mL PEEP:  [5 cmH20] 5 cmH20 Plateau Pressure:  [15 cmH20-17 cmH20] 15 cmH20   Intake/Output Summary (Last 24 hours) at 08/31/2019 0720 Last data filed at 08/31/2019 0630 Gross per 24 hour  Intake 890.3 ml  Output 1700 ml  Net -809.7 ml   Filed Weights   08/30/19 2056  Weight: 117 kg   General:  Obese M, intubated and sedated HEENT: MM pink/moist, ETT in place Neuro: sedated after intubation, withdraws to pain CV: s1s2 rrr, no m/r/g PULM:  No increased wob, CTAB GI: soft, bsx4 active  Extremities: warm/dry, no edema  Skin: lesion of the R shoulder without significant erythema or induration   Resolved Hospital Problem list   none  Assessment & Plan:   Acute Encephalopathy: uncertain initial presentation there was a question of possible cardiac arrest, seizure activity.  Patient has also had falls associated with heart failure and arrhythmia, no tachyarrhythmia when AICD interrogated in the ED troponin flat.  Was binge drinking week before presentation.  UDS, ETOH and ammonia wnl.  Agitated this morning added precedex  P: -Neurology consulted, continue Keppra plan for EEG in the morning and MRI if AICD is compatible and patient more stable -ECHO pending -Follow cultures -low bp with propofol switch to precedex -will add CIWA protocol when off propofol/precedex   Acute hypoxic respiratory failure: Chest x-ray with mild vascular congestion, Covid-19 negative.  Intubated in the ED  for combination of increasing agitation and hypoxia.  ABG 1/16 with good oxygenation and ventilation. P: -Continue empiric heparin for possible PE, will look at ECHO, ordered D dimer, decide on need for V/Q from there -Maintain full vent support with SAT/SBT as tolerated -titrate Vent setting to maintain SpO2 greater  than or equal to 90%. -HOB elevated 30 degrees. -Plateau pressures less than 30 cm H20.  -Bronchial hygiene and RT/bronchodilator protocol.   Acute kidney injury: Admission Creatinine 2.0, baseline less than 1.  Improving P: -Trend metabolic panel and avoid nephrotoxins -Replete electrolytes potassium, magnesium, calcium   Alcohol abuse with mildly elevated transaminases:  ammonia level/lipase only mild elevation P -Trend LFTs  -CIWA and resources for substance abuse once extubated -Replete thiamine   NICM, HTN: Last echo in 05/2019 with EF of 40 to 45%, EKG sinus rhythm on arrival.  Initially given Lasix 20 mg x 1 for elevated BNP and vascular congestion on chest x-ray P: -Repeat echo, monitor volume status, hold lasix and carvedilol as pt hypotensive -Continue home amiodarone   Hypokalemia: History of chronic hypokalemia P: -check Phos, repeat mag -Give additional 10 M EQ IV x6, serial BMP -add spiro back when renal function/bp stabilizes  Chronic right shoulder wound -No evidence of osteo or cellulitis or purulent drainage noted in the ED P: -Obtain blood cultures and treat with Ancef  Best practice:  Diet: N.p.o. Pain/Anxiety/Delirium protocol (if indicated): Propofol/precedex VAP protocol (if indicated): Yes DVT prophylaxis: Heparin GI prophylaxis: Protonix Glucose control: SSI Mobility: Bedrest Code Status: Full Family Communication: Unable to either contact listed Disposition: ICU  Labs   CBC: Recent Labs  Lab 08/30/19 2036 08/30/19 2121 08/31/19 0037 08/31/19 0051 08/31/19 0329  WBC  --   --  9.6  --   --   NEUTROABS  --   --  8.0*  --   --   HGB 16.3 14.6 13.8 13.3 12.6*  HCT 48.0 43.0 46.8 39.0 37.0*  MCV  --   --  105.2*  --   --   PLT  --   --  124*  --   --     Basic Metabolic Panel: Recent Labs  Lab 08/30/19 2035 08/30/19 2035 08/30/19 2036 08/30/19 2036 08/30/19 2121 08/31/19 0037 08/31/19 0051 08/31/19 0329 08/31/19 0510  NA  140   < > 139   < > 139 142 141 141 139  K 2.8*   < > 2.5*   < > 2.4* 2.6* 2.5* 2.4* 3.0*  CL 97*  --  100  --   --  103  --   --  101  CO2 22  --   --   --   --  20*  --   --  20*  GLUCOSE 94  --  91  --   --  64*  --   --  67*  BUN 28*  --  30*  --   --  27*  --   --  24*  CREATININE 2.03*  --  1.70*  --   --  1.62*  --   --  1.40*  CALCIUM 8.6*  --   --   --   --  8.1*  --   --  7.3*  MG 2.0  --   --   --   --   --   --   --   --    < > = values in this interval not displayed.   GFR:  Estimated Creatinine Clearance: 81.4 mL/min (A) (by C-G formula based on SCr of 1.4 mg/dL (H)). Recent Labs  Lab 08/31/19 0037 08/31/19 0238 08/31/19 0510  PROCALCITON 0.32  --   --   WBC 9.6  --   --   LATICACIDVEN  --  1.3 1.1    Liver Function Tests: Recent Labs  Lab 08/30/19 2035  AST 129*  ALT 48*  ALKPHOS 183*  BILITOT 2.0*  PROT 7.0  ALBUMIN 3.7   Recent Labs  Lab 08/31/19 0510  LIPASE 60*   Recent Labs  Lab 08/31/19 0510  AMMONIA 68*    ABG    Component Value Date/Time   PHART 7.283 (L) 08/31/2019 0329   PCO2ART 39.4 08/31/2019 0329   PO2ART 68.0 (L) 08/31/2019 0329   HCO3 18.6 (L) 08/31/2019 0329   TCO2 20 (L) 08/31/2019 0329   ACIDBASEDEF 8.0 (H) 08/31/2019 0329   O2SAT 91.0 08/31/2019 0329     Coagulation Profile: No results for input(s): INR, PROTIME in the last 168 hours.  Cardiac Enzymes: No results for input(s): CKTOTAL, CKMB, CKMBINDEX, TROPONINI in the last 168 hours.  HbA1C: Hgb A1c MFr Bld  Date/Time Value Ref Range Status  02/14/2017 03:30 PM 4.9 4.8 - 5.6 % Final    Comment:    (NOTE)         Pre-diabetes: 5.7 - 6.4         Diabetes: >6.4         Glycemic control for adults with diabetes: <7.0   04/29/2016 09:59 AM 5.0 4.8 - 5.6 % Final    Comment:    (NOTE)         Pre-diabetes: 5.7 - 6.4         Diabetes: >6.4         Glycemic control for adults with diabetes: <7.0     CBG: Recent Labs  Lab 08/30/19 2056 08/31/19 0224  08/31/19 0251  GLUCAP 88 48* 125*    Review of Systems:   Unable to obtain secondary to sedated and intubated  Past Medical History  He,  has a past medical history of AICD (automatic cardioverter/defibrillator) present, Arthritis, Asthma, Cataract, Chronic systolic heart failure ([place]), Diabetes mellitus without complication ([place]), Family history of adverse reaction to anesthesia, History of hiatal hernia, History of kidney stones, Hypertension, Hypokalemia, Neuromuscular disorder ([place]), Osteomyelitis ([place]), Ventricular fibrillation ([place]), and Ventricular tachycardia ([place]).   Surgical History    Past Surgical History:  Procedure Laterality Date  . BACK SURGERY  07/2000   fusion , [facility]  . BONE EXCISION Right 04/29/2016   Procedure: EXCISION HETEROTOPIC BONE RIGHT HUMERUS;  Surgeon: Altamese Emerald, MD;  Location: [facility];  Service: Orthopedics;  Laterality: Right;  . CARDIAC CATHETERIZATION  08/2012  . CYSTOSCOPY/URETEROSCOPY/HOLMIUM LASER/STENT PLACEMENT Bilateral 04/03/2017   Procedure: CYSTOSCOPY/URETEROSCOPY/HOLMIUM LASER/LEFT STENT REPLACEMENT AND RIGHT   STENT PLACEMENT;  Surgeon: Nickie Retort, MD;  Location: [facility];  Service: Urology;  Laterality: Bilateral;  . FRACTURE SURGERY Left    hip , r shoulder   . HARDWARE REMOVAL Right 04/29/2016   Procedure: HARDWARE REMOVAL;  Surgeon: Altamese , MD;  Location: [facility];  Service: Orthopedics;  Laterality: Right;  . HERNIA REPAIR Right    inguinal   . HIP SURGERY Left YRS AGO   hardware present, for slipped joint   . HOLMIUM LASER APPLICATION Bilateral 7/41/2878   Procedure: HOLMIUM LASER APPLICATION;  Surgeon: Nickie Retort, MD;  Location: [facility];  Service:  Urology;  Laterality: Bilateral;  . ICD  2012   [employer]  . IR URETERAL STENT LEFT NEW ACCESS W/O SEP NEPHROSTOMY CATH  02/20/2017  . NEPHROLITHOTOMY Left 02/20/2017   Procedure: NEPHROLITHOTOMY PERCUTANEOUS;  Surgeon: Nickie Retort, MD;   Location: [facility];  Service: Urology;  Laterality: Left;  . ORIF HUMERUS FRACTURE Right 07/20/2015   Procedure: OPEN REDUCTION INTERNAL FIXATION (ORIF) PROXIMAL HUMERUS FRACTURE;  Surgeon: Netta Cedars, MD;  Location: [facility];  Service: Orthopedics;  Laterality: Right;  . TONSILLECTOMY  AS CHILD  . ulnar surgery Right      Social History   reports that he has never smoked. He has never used smokeless tobacco. He reports that he does not drink alcohol or use drugs.   Family History   His family history includes CAD in his father and another family member; Heart failure in his mother; Hypertension in his mother and another family member.   Allergies Allergies  Allergen Reactions  . Penicillins Other (See Comments)    Has patient had a PCN reaction causing immediate rash, facial/tongue/throat swelling, SOB or lightheadedness with hypotension: Yes Has patient had a PCN reaction causing severe rash involving mucus membranes or skin necrosis: Yes Has patient had a PCN reaction that required hospitalization:  No Has patient had a PCN reaction occurring within the last 10 years: No If all of the above answers are "NO", then may proceed with Cephalosporin use..   childhood reaction, swelling & had to be given an anti     Home Medications  Prior to Admission medications   Medication Sig Start Date End Date Taking? Authorizing Provider  albuterol (PROVENTIL HFA;VENTOLIN HFA) 108 (90 Base) MCG/ACT inhaler Inhale 2 puffs into the lungs every 6 (six) hours as needed for wheezing or shortness of breath.   Yes [provider]  amiodarone (PACERONE) 200 MG tablet Take 1 tablet (200 mg total) by mouth daily. 07/26/19  Yes [name] Friar, PA-C  carvedilol (COREG) 3.125 MG tablet Take 1 tablet (3.125 mg total) by mouth 2 (two) times daily with a meal. 05/18/19  Yes Dunn, Dayna N, PA-C  hydrocortisone cream 1 % Apply 1 application topically 2 (two) times daily as needed for itching.   Yes  [provider]  Magnesium Oxide 400 (240 Mg) MG TABS Take 1 tablet (400 mg total) by mouth 2 (two) times daily. 05/31/19  Yes Dunn, Dayna N, PA-C  Multiple Vitamin (MULTIVITAMIN WITH MINERALS) TABS tablet Take 1 tablet by mouth daily. 08/22/16  Yes Elgergawy, Silver Huguenin, MD  potassium chloride SA (KLOR-CON) 20 MEQ tablet Take 2 tablets by mouth in the a.m,, take 1 tablet by mouth in the afternoon 05/31/19  Yes Dunn, Dayna N, PA-C  spironolactone (ALDACTONE) 25 MG tablet Take 0.5 tablets (12.5 mg total) by mouth daily. 06/04/19  Yes Larey Dresser, MD  glucose monitoring kit (FREESTYLE) monitoring kit 1 each by Does not apply route 4 (four) times daily - after meals and at bedtime. 1 month Diabetic Testing Supplies for QAC-QHS accuchecks. 07/23/15   GhimireHenreitta Leber, MD

## 2019-08-31 NOTE — Progress Notes (Signed)
ABG results given to Dr Frances Furbish. ABG results within normal limits, but K- 2.7 critical.  Mat Carne RRT

## 2019-08-31 NOTE — Progress Notes (Signed)
CRITICAL VALUE ALERT  Critical Value:  K 2.7  Date & Time Notied:  1012  Provider Notified: Thornell Mule, MD  Orders Received/Actions taken:   MD Aware

## 2019-08-31 NOTE — ED Notes (Signed)
The pt has an opening in an old incision to his rt shoulder  Purulent thick drainage observed from the incision with a foul odor

## 2019-08-31 NOTE — Progress Notes (Signed)
eLink Physician-Brief Progress Note Patient Name: Justin Bernard DOB: August 30, 1960 MRN: [id number]   Date of Service  08/31/2019  HPI/Events of Note  Pt is on the ventilator and needs restraints to prevent self extubation, he also needs a PRN  Supplemental sedation drug.  eICU Interventions  Bilateral soft wrist restraints ordered, PRN Versed ordered.        [name] 08/31/2019, 3:42 AM

## 2019-08-31 NOTE — Progress Notes (Signed)
eLink Physician-Brief Progress Note Patient Name: Justin Bernard DOB: [birth year] MRN: [id number]   Date of Service  08/31/2019  HPI/Events of Note  BP 97/58, MAP 71  eICU Interventions  No new orders at this time.        Thomasene Lot Chizara Mena 08/31/2019, 6:48 AM

## 2019-08-31 NOTE — Progress Notes (Signed)
  Echocardiogram 2D Echocardiogram has been performed.  Delcie Roch 08/31/2019, 12:58 PM

## 2019-08-31 NOTE — Progress Notes (Signed)
[place] for Heparin Indication: pulmonary embolus  Allergies  Allergen Reactions  . Penicillins Other (See Comments)    Has patient had a PCN reaction causing immediate rash, facial/tongue/throat swelling, SOB or lightheadedness with hypotension: Yes Has patient had a PCN reaction causing severe rash involving mucus membranes or skin necrosis: Yes Has patient had a PCN reaction that required hospitalization:  No Has patient had a PCN reaction occurring within the last 10 years: No If all of the above answers are "NO", then may proceed with Cephalosporin use..   childhood reaction, swelling & had to be given an anti    Patient Measurements: Height: '6\' 4"'$  (193 cm) Weight: 258 lb (117 kg) IBW/kg (Calculated) : 86.8 Heparin Dosing Weight: 111.1 kg   Vital Signs: Temp: 98.6 F (37 C) (01/16 2200) Temp Source: Bladder (01/16 1600) BP: 119/64 (01/16 2200) Pulse Rate: 57 (01/16 2200)  Labs: Recent Labs    08/30/19 2035 08/30/19 2036 08/31/19 0037 08/31/19 0037 08/31/19 0051 08/31/19 0051 08/31/19 0329 08/31/19 0510 08/31/19 0510 08/31/19 0829 08/31/19 0917 08/31/19 1329 08/31/19 1525 08/31/19 2041 08/31/19 2259  HGB  --    < > 13.8   < > 13.3   < > 12.6*  --   --  11.6*  --   --   --   --   --   HCT  --    < > 46.8   < > 39.0  --  37.0*  --   --  34.0*  --   --   --   --   --   PLT  --   --  124*  --   --   --   --   --   --   --   --   --   --   --   --   HEPARINUNFRC  --   --   --   --   --   --   --  1.28*  --   --   --   --  0.50  --  0.46  CREATININE 2.03*   < > 1.62*   < >  --   --   --  1.40*   < >  --  1.71* 1.60*  --  1.56*  --   CKTOTAL  --   --   --   --   --   --   --   --   --   --  215  --   --   --   --   TROPONINIHS 38*  --  38*  --   --   --   --   --   --   --   --   --   --   --   --    < > = values in this interval not displayed.    Estimated Creatinine Clearance: 73.1 mL/min (A) (by C-G formula based on SCr  of 1.56 mg/dL (H)).   Medical History: Past Medical History:  Diagnosis Date  . AICD (automatic cardioverter/defibrillator) present   . Arthritis    L hip   . Asthma    pt. uses albuterol inhaler in the spring for enviromental allergies   . Cataract    LEFT EYE  . Chronic systolic heart failure (HCC)    NICM, previous heart transplant candidate with EF 10%  . Diabetes mellitus without complication ([place])    TYPE  2  . Family history of adverse reaction to anesthesia    sister- headache & N&V am and mother  . History of hiatal hernia   . History of kidney stones   . Hypertension   . Hypokalemia   . Neuromuscular disorder ([place])    essential tremor  . Osteomyelitis ([place])    a. MSSA osteomyelitis of the right shoulder.  . Ventricular fibrillation ([place])   . Ventricular tachycardia (HCC)     Medications:  Scheduled:  . amiodarone  200 mg Oral Daily  . chlorhexidine gluconate (MEDLINE KIT)  15 mL Mouth Rinse BID  . Chlorhexidine Gluconate Cloth  6 each Topical Daily  . folic acid  1 mg Per Tube Daily  . [START ON 09/01/2019] LORazepam  1 mg Intravenous Q6H  . LORazepam  2 mg Intravenous Q6H  . mouth rinse  15 mL Mouth Rinse 10 times per day  . multivitamin with minerals  1 tablet Per Tube Daily  . pantoprazole (PROTONIX) IV  40 mg Intravenous Daily  . thiamine  100 mg Oral Daily   Or  . thiamine  100 mg Intravenous Daily    Assessment: Patient is a 65 yom that presents to the ED after what appears to have been a syncopal event followed by a brief period when the patient was pulseless. The patient arrived talking however was confused. The patients current respiratory status is declining and may need intubation. CTPE study in unable to be done at this time 2/2 current renal function. With the concern for a PE Pharmacy has been asked to dose heparin in this patient.   Heparin level came back therapeutic at 0.5, on 1600 units/hr. Hgb down slightly 11.6, plt 124. No s/sx of  bleeding.   1/16 PM update:  Heparin level therapeutic x 2  Goal of Therapy:  Heparin level 0.3-0.7 units/ml Monitor platelets by anticoagulation protocol: Yes   Plan:   -Continue heparin drip at 1600 units/hr -Daily CBC/HL -Monitor patient for s/s of bleeding  Narda Bonds, PharmD, BCPS Clinical Pharmacist Phone: [phone]

## 2019-08-31 NOTE — Progress Notes (Signed)
[place] for Heparin Indication: pulmonary embolus  Allergies  Allergen Reactions  . Penicillins Other (See Comments)    Has patient had a PCN reaction causing immediate rash, facial/tongue/throat swelling, SOB or lightheadedness with hypotension: Yes Has patient had a PCN reaction causing severe rash involving mucus membranes or skin necrosis: Yes Has patient had a PCN reaction that required hospitalization:  No Has patient had a PCN reaction occurring within the last 10 years: No If all of the above answers are "NO", then may proceed with Cephalosporin use..   childhood reaction, swelling & had to be given an anti    Patient Measurements: Height: '6\' 4"'$  (193 cm) Weight: 258 lb (117 kg) IBW/kg (Calculated) : 86.8 Heparin Dosing Weight: 111.1 kg   Vital Signs: Temp: 98.8 F (37.1 C) (01/16 0500) Temp Source: Bladder (01/16 0400) BP: 87/48 (01/16 0500) Pulse Rate: 75 (01/16 0500)  Labs: Recent Labs    08/30/19 2035 08/30/19 2035 08/30/19 2036 08/30/19 2121 08/31/19 0037 08/31/19 0037 08/31/19 0051 08/31/19 0329 08/31/19 0510  HGB  --   --  16.3   < > 13.8   < > 13.3 12.6*  --   HCT  --   --  48.0   < > 46.8  --  39.0 37.0*  --   PLT  --   --   --   --  124*  --   --   --   --   HEPARINUNFRC  --   --   --   --   --   --   --   --  1.28*  CREATININE 2.03*   < > 1.70*  --  1.62*  --   --   --  1.40*  TROPONINIHS 38*  --   --   --  38*  --   --   --   --    < > = values in this interval not displayed.    Estimated Creatinine Clearance: 81.4 mL/min (A) (by C-G formula based on SCr of 1.4 mg/dL (H)).   Medical History: Past Medical History:  Diagnosis Date  . AICD (automatic cardioverter/defibrillator) present   . Arthritis    L hip   . Asthma    pt. uses albuterol inhaler in the spring for enviromental allergies   . Cataract    LEFT EYE  . Chronic systolic heart failure (HCC)    NICM, previous heart transplant candidate  with EF 10%  . Diabetes mellitus without complication ([place])    TYPE 2  . Family history of adverse reaction to anesthesia    sister- headache & N&V am and mother  . History of hiatal hernia   . History of kidney stones   . Hypertension   . Hypokalemia   . Neuromuscular disorder ([place])    essential tremor  . Osteomyelitis ([place])    a. MSSA osteomyelitis of the right shoulder.  . Ventricular fibrillation ([place])   . Ventricular tachycardia (HCC)     Medications:  Scheduled:  . amiodarone  200 mg Oral Daily  . carvedilol  3.125 mg Oral BID WC  . chlorhexidine gluconate (MEDLINE KIT)  15 mL Mouth Rinse BID  . mouth rinse  15 mL Mouth Rinse 10 times per day  . pantoprazole (PROTONIX) IV  40 mg Intravenous Daily  . potassium chloride SA  40 mEq Oral Once  . thiamine  100 mg Oral Daily   Or  . thiamine  100 mg Intravenous Daily    Assessment: Patient is a 52 yom that presents to the ED after what appears to have been a syncopal event followed by a brief period when the patient was pulseless. The patient arrived talking however was confused. The patients current respiratory status is declining and may need intubation. CTPE study in unable to be done at this time 2/2 current renal function. With the concern for a PE Pharmacy has been asked to dose heparin in this patient.   1/16 AM update:  Heparin level elevated No issues per RN  Goal of Therapy:  Heparin level 0.3-0.7 units/ml Monitor platelets by anticoagulation protocol: Yes   Plan:   -Hold heparin x 1 hr -Re-start heparin drip at 1600 units/hr at 0730 -Re-check heparin level at 1530 - Monitor patient for s/s of bleeding and CBC while on heparin.   Narda Bonds, PharmD, BCPS Clinical Pharmacist Phone: [phone]

## 2019-08-31 NOTE — Progress Notes (Signed)
Hypoglycemic Event  CBG: 48  Treatment: amp of D50  Symptoms: non   Follow-up CBG: Time: 0250 CBG Result: 125  Possible Reasons for Event: NPO   Comments/MD notified: Followed hypoglycemic protocol     Vincent Gros, RN

## 2019-08-31 NOTE — Progress Notes (Signed)
[place] for Heparin Indication: pulmonary embolus  Allergies  Allergen Reactions  . Penicillins Other (See Comments)    Has patient had a PCN reaction causing immediate rash, facial/tongue/throat swelling, SOB or lightheadedness with hypotension: Yes Has patient had a PCN reaction causing severe rash involving mucus membranes or skin necrosis: Yes Has patient had a PCN reaction that required hospitalization:  No Has patient had a PCN reaction occurring within the last 10 years: No If all of the above answers are "NO", then may proceed with Cephalosporin use..   childhood reaction, swelling & had to be given an anti    Patient Measurements: Height: _0  (193 cm) Weight: 258 lb (117 kg) IBW/kg (Calculated) : 86.8 Heparin Dosing Weight: 111.1 kg   Vital Signs: Temp: 97.5 F (36.4 C) (01/16 1548) Temp Source: Oral (01/16 1548) BP: 95/54 (01/16 1400) Pulse Rate: 53 (01/16 1400)  Labs: Recent Labs    08/30/19 2035 08/30/19 2036 08/31/19 0037 08/31/19 0037 08/31/19 0051 08/31/19 0051 08/31/19 0329 08/31/19 0510 08/31/19 0829 08/31/19 0917 08/31/19 1329 08/31/19 1525  HGB  --    < > 13.8   < > 13.3   < > 12.6*  --  11.6*  --   --   --   HCT  --    < > 46.8   < > 39.0  --  37.0*  --  34.0*  --   --   --   PLT  --   --  124*  --   --   --   --   --   --   --   --   --   HEPARINUNFRC  --   --   --   --   --   --   --  1.28*  --   --   --  0.50  CREATININE 2.03*   < > 1.62*   < >  --   --   --  1.40*  --  1.71* 1.60*  --   CKTOTAL  --   --   --   --   --   --   --   --   --  215  --   --   TROPONINIHS 38*  --  38*  --   --   --   --   --   --   --   --   --    < > = values in this interval not displayed.    Estimated Creatinine Clearance: 71.3 mL/min (A) (by C-G formula based on SCr of 1.6 mg/dL (H)).   Medical History: Past Medical History:  Diagnosis Date  . AICD (automatic cardioverter/defibrillator) present   . Arthritis     L hip   . Asthma    pt. uses albuterol inhaler in the spring for enviromental allergies   . Cataract    LEFT EYE  . Chronic systolic heart failure (HCC)    NICM, previous heart transplant candidate with EF 10%  . Diabetes mellitus without complication ([place])    TYPE 2  . Family history of adverse reaction to anesthesia    sister- headache & N&V am and mother  . History of hiatal hernia   . History of kidney stones   . Hypertension   . Hypokalemia   . Neuromuscular disorder ([place])    essential tremor  . Osteomyelitis ([place])    a. MSSA osteomyelitis of the right shoulder.  Marland Kitchen  Ventricular fibrillation ([place])   . Ventricular tachycardia (HCC)     Medications:  Scheduled:  . amiodarone  200 mg Oral Daily  . chlorhexidine gluconate (MEDLINE KIT)  15 mL Mouth Rinse BID  . Chlorhexidine Gluconate Cloth  6 each Topical Daily  . folic acid  1 mg Per Tube Daily  . [START ON 09/01/2019] LORazepam  1 mg Intravenous Q6H  . LORazepam  2 mg Intravenous Q6H  . mouth rinse  15 mL Mouth Rinse 10 times per day  . multivitamin with minerals  1 tablet Per Tube Daily  . pantoprazole (PROTONIX) IV  40 mg Intravenous Daily  . potassium chloride  20 mEq Oral BID  . thiamine  100 mg Oral Daily   Or  . thiamine  100 mg Intravenous Daily    Assessment: Patient is a 61 yom that presents to the ED after what appears to have been a syncopal event followed by a brief period when the patient was pulseless. The patient arrived talking however was confused. The patients current respiratory status is declining and may need intubation. CTPE study in unable to be done at this time 2/2 current renal function. With the concern for a PE Pharmacy has been asked to dose heparin in this patient.   Heparin level came back therapeutic at 0.5, on 1600 units/hr. Hgb down slightly 11.6, plt 124. No s/sx of bleeding.   Goal of Therapy:  Heparin level 0.3-0.7 units/ml Monitor platelets by anticoagulation protocol: Yes    Plan:   -Continue heparin drip at 1600 units/hr -Order heparin level in 6 hours  -Monitor patient for s/s of bleeding and CBC while on heparin.   Antonietta Jewel, PharmD, BCCCP Clinical Pharmacist  Phone: [phone]  Please check AMION for all [place] phone numbers After 10:00 PM, call [place] [phone]

## 2019-08-31 NOTE — Progress Notes (Signed)
eLink Physician-Brief Progress Note Patient Name: Justin Bernard DOB: 29-Jun-1961 MRN: [id number]   Date of Service  08/31/2019  HPI/Events of Note  Hypotension on Propofol  eICU Interventions  250 ml saline bolus ordered.        Thomasene Lot Leitha Hyppolite 08/31/2019, 4:22 AM

## 2019-08-31 NOTE — Progress Notes (Signed)
VASCULAR LAB PRELIMINARY  PRELIMINARY  PRELIMINARY  PRELIMINARY  Bilateral lower extremity venous duplex completed.    Preliminary report:  See CV proc for preliminary results.   Allura Doepke, RVT 08/31/2019, 5:20 PM

## 2019-08-31 NOTE — ED Notes (Signed)
Report given to rn on 2m 

## 2019-08-31 NOTE — Progress Notes (Signed)
EEG complete - results pending 

## 2019-08-31 NOTE — Procedures (Signed)
Patient Name: Justin Bernard  MRN: [id number]  Epilepsy Attending: Charlsie Quest  Referring Physician/Provider: Dr Caryl Pina Date: 08/31/2019 Duration: 23.28 mins  Patient history: 59yo M with h/o alcohol use, who was found pulseless by EMS sp COR for 2 mins with ROSC. Upon awakening, he had some stiffening/tonic posturing. EEG to evaluate for seizure  Level of alertness: awake, asleep/sedated  AEDs during EEG study: Ativan  Technical aspects: This EEG study was done with scalp electrodes positioned according to the 10-20 International system of electrode placement. Electrical activity was acquired at a sampling rate of 500Hz  and reviewed with a high frequency filter of 70Hz  and a low frequency filter of 1Hz . EEG data were recorded continuously and digitally stored.   DESCRIPTION: During awake state, no clear posterior dominant rhythm was seen.  EEG showed continuous generalized 3-5hz  theta-delta slowing admixed with 13-15Hz  beta activity, maximal frontocentral. Hyperventilation and photic stimulation were not performed.  ABNORMALITY - Continuous slow, generalized  IMPRESSION: This study is suggestive of moderate diffuse encephalopathy, non specific to etiology but could be secondary to sedation, benzodiazepine use. No seizures or epileptiform discharges were seen throughout the recording.  [name] 

## 2019-09-01 DIAGNOSIS — G9340 Encephalopathy, unspecified: Secondary | ICD-10-CM

## 2019-09-01 DIAGNOSIS — R55 Syncope and collapse: Secondary | ICD-10-CM

## 2019-09-01 DIAGNOSIS — F10231 Alcohol dependence with withdrawal delirium: Secondary | ICD-10-CM

## 2019-09-01 LAB — CBC
HCT: 34.4 % — ABNORMAL LOW (ref 39.0–52.0)
Hemoglobin: 10.4 g/dL — ABNORMAL LOW (ref 13.0–17.0)
MCH: 31.4 pg (ref 26.0–34.0)
MCHC: 30.2 g/dL (ref 30.0–36.0)
MCV: 103.9 fL — ABNORMAL HIGH (ref 80.0–100.0)
Platelets: 116 10*3/uL — ABNORMAL LOW (ref 150–400)
RBC: 3.31 MIL/uL — ABNORMAL LOW (ref 4.22–5.81)
RDW: 17.3 % — ABNORMAL HIGH (ref 11.5–15.5)
WBC: 7.9 10*3/uL (ref 4.0–10.5)
nRBC: 0 % (ref 0.0–0.2)

## 2019-09-01 LAB — BASIC METABOLIC PANEL
Anion gap: 16 — ABNORMAL HIGH (ref 5–15)
Anion gap: 14 (ref 5–15)
Anion gap: 8 (ref 5–15)
BUN: 22 mg/dL — ABNORMAL HIGH (ref 6–20)
BUN: 22 mg/dL — ABNORMAL HIGH (ref 6–20)
BUN: 21 mg/dL — ABNORMAL HIGH (ref 6–20)
CO2: 14 mmol/L — ABNORMAL LOW (ref 22–32)
CO2: 17 mmol/L — ABNORMAL LOW (ref 22–32)
CO2: 21 mmol/L — ABNORMAL LOW (ref 22–32)
Calcium: 7.9 mg/dL — ABNORMAL LOW (ref 8.9–10.3)
Calcium: 8 mg/dL — ABNORMAL LOW (ref 8.9–10.3)
Calcium: 7.7 mg/dL — ABNORMAL LOW (ref 8.9–10.3)
Chloride: 112 mmol/L — ABNORMAL HIGH (ref 98–111)
Chloride: 112 mmol/L — ABNORMAL HIGH (ref 98–111)
Chloride: 114 mmol/L — ABNORMAL HIGH (ref 98–111)
Creatinine, Ser: 1.57 mg/dL — ABNORMAL HIGH (ref 0.61–1.24)
Creatinine, Ser: 1.58 mg/dL — ABNORMAL HIGH (ref 0.61–1.24)
Creatinine, Ser: 1.44 mg/dL — ABNORMAL HIGH (ref 0.61–1.24)
GFR calc Af Amer: 56 mL/min — ABNORMAL LOW (ref >60)
GFR calc Af Amer: 55 mL/min — ABNORMAL LOW (ref >60)
GFR calc Af Amer: >60 mL/min (ref >60)
GFR calc non Af Amer: 48 mL/min — ABNORMAL LOW (ref >60)
GFR calc non Af Amer: 48 mL/min — ABNORMAL LOW (ref >60)
GFR calc non Af Amer: 54 mL/min — ABNORMAL LOW (ref >60)
Glucose, Bld: 81 mg/dL (ref 70–99)
Glucose, Bld: 97 mg/dL (ref 70–99)
Glucose, Bld: 100 mg/dL — ABNORMAL HIGH (ref 70–99)
Potassium: 2.9 mmol/L — ABNORMAL LOW (ref 3.5–5.1)
Potassium: 3.9 mmol/L (ref 3.5–5.1)
Potassium: 4.2 mmol/L (ref 3.5–5.1)
Sodium: 142 mmol/L (ref 135–145)
Sodium: 143 mmol/L (ref 135–145)
Sodium: 143 mmol/L (ref 135–145)

## 2019-09-01 LAB — HEPATIC FUNCTION PANEL
ALT: 31 U/L (ref 0–44)
AST: 89 U/L — ABNORMAL HIGH (ref 15–41)
Albumin: 2.6 g/dL — ABNORMAL LOW (ref 3.5–5.0)
Alkaline Phosphatase: 145 U/L — ABNORMAL HIGH (ref 38–126)
Bilirubin, Direct: 0.4 mg/dL — ABNORMAL HIGH (ref 0.0–0.2)
Indirect Bilirubin: 0.7 mg/dL (ref 0.3–0.9)
Total Bilirubin: 1.1 mg/dL (ref 0.3–1.2)
Total Protein: 5.2 g/dL — ABNORMAL LOW (ref 6.5–8.1)

## 2019-09-01 LAB — URINE CULTURE: Culture: NO GROWTH

## 2019-09-01 LAB — HEPARIN LEVEL (UNFRACTIONATED): Heparin Unfractionated: 0.41 IU/mL (ref 0.30–0.70)

## 2019-09-01 LAB — GLUCOSE, CAPILLARY
Glucose-Capillary: 78 mg/dL (ref 70–99)
Glucose-Capillary: 90 mg/dL (ref 70–99)
Glucose-Capillary: 81 mg/dL (ref 70–99)
Glucose-Capillary: 99 mg/dL (ref 70–99)
Glucose-Capillary: 101 mg/dL — ABNORMAL HIGH (ref 70–99)

## 2019-09-01 LAB — PROCALCITONIN: Procalcitonin: 0.12 ng/mL

## 2019-09-01 MED ORDER — DIPHENHYDRAMINE HCL 50 MG/ML IJ SOLN
50.0000 mg | Freq: Four times a day (QID) | INTRAMUSCULAR | Status: DC | PRN
  Administered 2019-09-01 – 2019-09-05 (×5): 50 mg via INTRAVENOUS
  Filled 2019-09-01 (×6): qty 1

## 2019-09-01 MED ORDER — POTASSIUM CHLORIDE 20 MEQ/15ML (10%) PO SOLN
40.0000 meq | ORAL | Status: AC
  Administered 2019-09-01 (×3): 40 meq
  Filled 2019-09-01 (×3): qty 30

## 2019-09-01 MED ORDER — PANTOPRAZOLE SODIUM 40 MG PO TBEC: 40.0000 mg | DELAYED_RELEASE_TABLET | Freq: Every day | ORAL | Status: DC

## 2019-09-01 MED ORDER — HEPARIN SODIUM (PORCINE) 5000 UNIT/ML IJ SOLN
5000.0000 [IU] | Freq: Three times a day (TID) | INTRAMUSCULAR | Status: DC
  Administered 2019-09-01 – 2019-09-10 (×25): 5000 [IU] via SUBCUTANEOUS
  Filled 2019-09-01 (×25): qty 1

## 2019-09-01 MED ORDER — ORAL CARE MOUTH RINSE
15.0000 mL | Freq: Two times a day (BID) | OROMUCOSAL | Status: DC
  Administered 2019-09-01 – 2019-09-06 (×10): 15 mL via OROMUCOSAL

## 2019-09-01 NOTE — Progress Notes (Signed)
[place] for Heparin Indication: pulmonary embolus  Allergies  Allergen Reactions  . Penicillins Other (See Comments)    Has patient had a PCN reaction causing immediate rash, facial/tongue/throat swelling, SOB or lightheadedness with hypotension: Yes Has patient had a PCN reaction causing severe rash involving mucus membranes or skin necrosis: Yes Has patient had a PCN reaction that required hospitalization:  No Has patient had a PCN reaction occurring within the last 10 years: No If all of the above answers are "NO", then may proceed with Cephalosporin use..   childhood reaction, swelling & had to be given an anti    Patient Measurements: Height: '6\' 4"'$  (193 cm) Weight: 260 lb 5.8 oz (118.1 kg) IBW/kg (Calculated) : 86.8 Heparin Dosing Weight: 111.1 kg   Vital Signs: Temp: 99.1 F (37.3 C) (01/17 0600) BP: 101/59 (01/17 0600) Pulse Rate: 55 (01/17 0600)  Labs: Recent Labs    08/30/19 2035 08/30/19 2036 08/31/19 0037 08/31/19 0051 08/31/19 0329 08/31/19 0510 08/31/19 0829 08/31/19 0917 08/31/19 0917 08/31/19 1329 08/31/19 1525 08/31/19 2041 08/31/19 2259 09/01/19 0249  HGB  --    < > 13.8   < > 12.6*   < > 11.6*  --   --   --   --   --   --  10.4*  HCT  --    < > 46.8   < > 37.0*  --  34.0*  --   --   --   --   --   --  34.4*  PLT  --   --  124*  --   --   --   --   --   --   --   --   --   --  116*  HEPARINUNFRC  --   --   --   --   --    < >  --   --   --   --  0.50  --  0.46 0.41  CREATININE 2.03*   < > 1.62*  --   --    < >  --  1.71*   < > 1.60*  --  1.56*  --  1.57*  CKTOTAL  --   --   --   --   --   --   --  215  --   --   --   --   --   --   TROPONINIHS 38*  --  38*  --   --   --   --   --   --   --   --   --   --   --    < > = values in this interval not displayed.    Estimated Creatinine Clearance: 72.9 mL/min (A) (by C-G formula based on SCr of 1.57 mg/dL (H)).   Medical History: Past Medical History:   Diagnosis Date  . AICD (automatic cardioverter/defibrillator) present   . Arthritis    L hip   . Asthma    pt. uses albuterol inhaler in the spring for enviromental allergies   . Cataract    LEFT EYE  . Chronic systolic heart failure (HCC)    NICM, previous heart transplant candidate with EF 10%  . Diabetes mellitus without complication ([place])    TYPE 2  . Family history of adverse reaction to anesthesia    sister- headache & N&V am and mother  . History of hiatal hernia   .  History of kidney stones   . Hypertension   . Hypokalemia   . Neuromuscular disorder ([place])    essential tremor  . Osteomyelitis ([place])    a. MSSA osteomyelitis of the right shoulder.  . Ventricular fibrillation ([place])   . Ventricular tachycardia (HCC)     Medications:  Scheduled:  . amiodarone  200 mg Oral Daily  . chlorhexidine gluconate (MEDLINE KIT)  15 mL Mouth Rinse BID  . Chlorhexidine Gluconate Cloth  6 each Topical Daily  . folic acid  1 mg Per Tube Daily  . LORazepam  1 mg Intravenous Q6H  . LORazepam  2 mg Intravenous Q6H  . mouth rinse  15 mL Mouth Rinse 10 times per day  . multivitamin with minerals  1 tablet Per Tube Daily  . pantoprazole (PROTONIX) IV  40 mg Intravenous Daily  . potassium chloride  40 mEq Per Tube Q4H  . thiamine  100 mg Oral Daily   Or  . thiamine  100 mg Intravenous Daily    Assessment: Patient is a 40 yom that presents to the ED after what appears to have been a syncopal event followed by a brief period when the patient was pulseless. The patient arrived talking, however, was confused. CTPE study in unable to be done immediately 2/2 current renal function. With the concern for a PE, Pharmacy has been asked to dose heparin in this patient. D-dimer 1.57. LE dopplers 1/16 negative for DVT.  Heparin level remains therapeutic at 0.41. Hgb down to 10.4, plt down slightly to 116. No s/sx of bleeding reported.  Goal of Therapy:  Heparin level 0.3-0.7 units/ml Monitor  platelets by anticoagulation protocol: Yes   Plan:   Continue heparin drip at 1600 units/hr Monitor daily heparin level and CBC, s/sx bleeding   Elicia Lamp, PharmD, BCPS Please check AMION for all [place] contact numbers Clinical Pharmacist 09/01/2019 7:31 AM

## 2019-09-01 NOTE — Progress Notes (Signed)
PULMONARY / CRITICAL CARE MEDICINE   NAME:  Justin Bernard, MRN:  [id number], DOB:  1960/11/16, LOS: 1 ADMISSION DATE:  08/30/2019, CONSULTATION DATE: 08/31/19 REFERRING MD: Dr. Criss Alvine , CHIEF COMPLAINT:  Syncope  BRIEF HISTORY:    59 y.o hfref, hx of vtach/vfib w/ aicd, etoh abuse who was admitted after falling in home. Found without pulse. CPR performed for 2 minutes prior to ROSC obtained. Intubated in ED. Being treated for acute encephalopathy.  SIGNIFICANT PAST MEDICAL HISTORY    AICD (automatic cardioverter/defibrillator) present, Arthritis, Asthma, Cataract, Chronic systolic heart failure (HCC), Diabetes mellitus without complication (HCC), Family history of adverse reaction to anesthesia, History of hiatal hernia, History of kidney stones, Hypertension, Hypokalemia, Neuromuscular disorder (HCC), Osteomyelitis (HCC), Ventricular fibrillation (HCC), and Ventricular tachycardia (HCC).  SIGNIFICANT EVENTS:  1/16 Admit to PCCM, empiric heparin for presumed pe, keppra load  pt agitated given precedex, dec propofol due to low bp 1/17 extubated   CULTURES:  1/16 blood culture no growth 1 day 1/15 urine culture  1/5 covid, flu-negative   ANTIBIOTICS:   Ancef 1/16>  LINES/TUBES:  Peripheral IV bilateral antecubital  Peripheral iv right hand  CONSULTANTS:  Neurology SUBJECTIVE:   Patient recalls what brought him into hospital. Stated he is doing well after being extubated this morning.   CONSTITUTIONAL: BP (!) 101/59   Pulse (!) 55   Temp 99.1 F (37.3 C)   Resp 16   Ht 6\' 4"  (1.93 m)   Wt 118.1 kg   SpO2 99%   BMI 31.69 kg/m   I/O last 3 completed shifts: In: 6270 [I.V.:3025.7; IV Piggyback:3244.3] Out: 2935 [Urine:2935]  Vent Mode: PRVC FiO2 (%):  [40 %] 40 % Set Rate:  [20 bmp] 20 bmp Vt Set:  2936 mL] 690 mL PEEP:  [5 cmH20] 5 cmH20 Pressure Support:  [8 cmH20] 8 cmH20 Plateau Pressure:  [12 cmH20-18 cmH20] 18 cmH20  PHYSICAL EXAM: General:   Sitting up in bed doing well Neuro:  Alert, oriented x3, able to count backwards in serial 10s, able to do basic computation, tremors in bilateral hands HEENT: extubated Cardiovascular: rrr, s1 and s2 audible Lungs:  Ctab, no rales or rhonchi Musculoskeletal:  No pitting edema Skin: mild excoriations   ASSESSMENT AND PLAN   Acute respiratory failure  Patient has been extubated this morning 1/17. Lower extremity doppler does not show dvt on either side. D-dimer 1.57. Chest xray 1/16 without areas of consolidation or effusion.   -discontinued ventilation orders  -was placed on IV heparin for empiric pulmonary embolism   Acute metabolic encephalopathy  Likely secondary to alcohol withdrawal. UDS, etoh, ammonia levels normal. Procalcitonin 0.12. Patient is afebrile and without leukocytosis over the past 24 hrs. EEG showed moderate diffuse encephalopathy that is nonspecific and could be secondary to sedation or benzodiabepine use. No seizure or epileptiform discharges.   -follow up blood culture  -follow up urine culture -avoid sedation   Concern for Right Shoulder Osteomyelitis  Right shoulder xray showing chronic changes without acute abnormality.   -Was started on cefazolin 1/16  Alcohol Use Disorder  Last use Friday 1/15. He states he usually drinks 1-2 ounces of bourbon, other hard liquour and beer. He was hospitalized for alcohol withdrawal previously in 2015.  -thiamine  -folic acid  -multivitamins -precedex -ativan q6hrs  -versed q4hrs prn  Acute Kidney Injury Improving. Cr 1.57, bl 1-1.3.    Hypokalemia  K=2.9, Dr. 2016 gave order for kcl Warrick Parisian via og q4hrs x3 doses.   -repeat  bmp at 900 and 1400   HFrEF TTE shwoing 40-45% ef, ivc normal in size, left ventricle global hypokinesis.   Best Practice / Goals of Care / Disposition.   DVT PROPHYLAXIS: scds SUP: protonix  NUTRITION: NPO MOBILITY: pt, ot GOALS OF CARE:full FAMILY DISCUSSIONS: Pt stated he did not  want me to call family DISPOSITION ICU  LABS  Glucose Recent Labs  Lab 08/31/19 1058 08/31/19 1541 08/31/19 1931 08/31/19 2338 09/01/19 0336 09/01/19 0704  GLUCAP 96 85 87 73 78 90    BMET Recent Labs  Lab 08/31/19 1329 08/31/19 2041 09/01/19 0249  NA 138 138 142  K 3.2* 2.9* 2.9*  CL 105 106 112*  CO2 18* 16* 14*  BUN 26* 24* 22*  CREATININE 1.60* 1.56* 1.57*  GLUCOSE 111* 88 81    Liver Enzymes Recent Labs  Lab 08/30/19 2035  AST 129*  ALT 48*  ALKPHOS 183*  BILITOT 2.0*  ALBUMIN 3.7    Electrolytes Recent Labs  Lab 08/30/19 2035 08/31/19 0037 08/31/19 0917 08/31/19 0917 08/31/19 1329 08/31/19 2041 09/01/19 0249  CALCIUM 8.6*   < > 7.4*   < > 7.8* 7.5* 7.9*  MG 2.0  --  1.9  --   --   --   --   PHOS  --   --  3.8  --   --   --   --    < > = values in this interval not displayed.    CBC Recent Labs  Lab 08/31/19 0037 08/31/19 0051 08/31/19 0329 08/31/19 0829 09/01/19 0249  WBC 9.6  --   --   --  7.9  HGB 13.8   < > 12.6* 11.6* 10.4*  HCT 46.8   < > 37.0* 34.0* 34.4*  PLT 124*  --   --   --  116*   < > = values in this interval not displayed.    ABG Recent Labs  Lab 08/31/19 0051 08/31/19 0329 08/31/19 0829  PHART 7.351 7.283* 7.351  PCO2ART 36.7 39.4 38.2  PO2ART 121.0* 68.0* 71.0*    Coag's No results for input(s): APTT, INR in the last 168 hours.  Sepsis Markers Recent Labs  Lab 08/31/19 0037 08/31/19 0238 08/31/19 0510 09/01/19 0249  LATICACIDVEN  --  1.3 1.1  --   PROCALCITON 0.32  --   --  0.12    Cardiac Enzymes No results for input(s): TROPONINI, PROBNP in the last 168 hours.  REVIEW OF SYSTEMS:     Patient doing well. Was conversive.   Lars Mage, MD Internal Medicine PGY3 09/01/2019, 8:36 AM

## 2019-09-01 NOTE — Progress Notes (Addendum)
Subjective: Patient awake , alert, NAD. 4L [place]. No further seizures, but tremors present.  Objective: Current vital signs: BP (!) 101/59   Pulse (!) 55   Temp 99.1 F (37.3 C)   Resp 16   Ht '6\' 4"'$  (1.93 m)   Wt 118.1 kg   SpO2 99%   BMI 31.69 kg/m  Vital signs in last 24 hours: Temp:  [96.8 F (36 C)-99.5 F (37.5 C)] 99.1 F (37.3 C) (01/17 0600) Pulse Rate:  [52-82] 55 (01/17 0600) Resp:  [10-22] 16 (01/17 0600) BP: (79-143)/(41-82) 101/59 (01/17 0600) SpO2:  [80 %-100 %] 99 % (01/17 0713) FiO2 (%):  [40 %] 40 % (01/17 0713) Weight:  [118.1 kg] 118.1 kg (01/17 0347)  Intake/Output from previous day: 01/16 0701 - 01/17 0700 In: 4750.9 [I.V.:2824; IV Piggyback:1927] Out: 1185 [Urine:1185] Intake/Output this shift: No intake/output data recorded. Nutritional status:  Diet Order            Diet NPO time specified  Diet effective now              Neurologic Exam:   Mental Status: Alert, oriented name/ age/month/ year.president, thought content appropriate.  Speech fluent without evidence of aphasia.  Able to follow commands without difficulty.  Cranial Nerves: II:  Visual fields grossly normal,  III,IV, VI: ptosis not present, extra-ocular motions intact bilaterally, pupils equal, round, reactive to light and accommodation V,VII: smile symmetric, facial light touch sensation normal bilaterally VIII: hearing normal bilaterally IX,X: uvula rises midline XI: bilateral shoulder shrug XII: midline tongue extension Motor: 5/5 in all 4 extremities. Tone and bulk:normal tone throughout; no atrophy noted Bilateral tremors present Sensory:light touch intact throughout, bilaterally Deep Tendon Reflexes: 2+ and symmetric biceps and patellae Plantars: Right: downgoing   Left: downgoing Cerebellar: No ataxia, no asterixis Gait: deferred   Lab Results: Results for orders placed or performed during the hospital encounter of 08/30/19 (from the past 48 hour(s))   Comprehensive metabolic panel     Status: Abnormal   Collection Time: 08/30/19  8:35 PM  Result Value Ref Range   Sodium 140 135 - 145 mmol/L   Potassium 2.8 (L) 3.5 - 5.1 mmol/L   Chloride 97 (L) 98 - 111 mmol/L   CO2 22 22 - 32 mmol/L   Glucose, Bld 94 70 - 99 mg/dL   BUN 28 (H) 6 - 20 mg/dL   Creatinine, Ser 2.03 (H) 0.61 - 1.24 mg/dL   Calcium 8.6 (L) 8.9 - 10.3 mg/dL   Total Protein 7.0 6.5 - 8.1 g/dL   Albumin 3.7 3.5 - 5.0 g/dL   AST 129 (H) 15 - 41 U/L   ALT 48 (H) 0 - 44 U/L   Alkaline Phosphatase 183 (H) 38 - 126 U/L   Total Bilirubin 2.0 (H) 0.3 - 1.2 mg/dL   GFR calc non Af Amer 35 (L) >60 mL/min   GFR calc Af Amer 41 (L) >60 mL/min   Anion gap 21 (H) 5 - 15    Comment: Performed at [facility], [address] [place], [place] [address]  Ethanol     Status: None   Collection Time: 08/30/19  8:35 PM  Result Value Ref Range   Alcohol, Ethyl (B) <10 <10 mg/dL    Comment: (NOTE) Lowest detectable limit for serum alcohol is 10 mg/dL. For medical purposes only. Performed at [facility], [place] [address], [place], [place] [address]   Brain natriuretic peptide     Status:  Abnormal   Collection Time: 08/30/19  8:35 PM  Result Value Ref Range   B Natriuretic Peptide 256.6 (H) 0.0 - 100.0 pg/mL    Comment: Performed at [facility] [address] [place], [place] [address]  Troponin I (High Sensitivity)     Status: Abnormal   Collection Time: 08/30/19  8:35 PM  Result Value Ref Range   Troponin I (High Sensitivity) 38 (H) <18 ng/L    Comment: (NOTE) Elevated high sensitivity troponin I (hsTnI) values and significant  changes across serial measurements may suggest ACS but many other  chronic and acute conditions are known to elevate hsTnI results.  Refer to the Links section for chest pain algorithms and additional  guidance. Performed at [facility], [place] [address] [place], [place] [address]   Salicylate level     Status: Abnormal    Collection Time: 08/30/19  8:35 PM  Result Value Ref Range   Salicylate Lvl <2.7 (L) 7.0 - 30.0 mg/dL    Comment: Performed at [facility] [address], [place], [place] [address]  Acetaminophen level     Status: Abnormal   Collection Time: 08/30/19  8:35 PM  Result Value Ref Range   Acetaminophen (Tylenol), Serum <10 (L) 10 - 30 ug/mL    Comment: (NOTE) Therapeutic concentrations vary significantly. A range of 10-30 ug/mL  may be an effective concentration for many patients. However, some  are best treated at concentrations outside of this range. Acetaminophen concentrations >150 ug/mL at 4 hours after ingestion  and >50 ug/mL at 12 hours after ingestion are often associated with  toxic reactions. Performed at [facility], [place] [address], [place], [place] [address]   Magnesium     Status: None   Collection Time: 08/30/19  8:35 PM  Result Value Ref Range   Magnesium 2.0 1.7 - 2.4 mg/dL    Comment: Performed at [facility], [place] [address] [place], [place] [address]  I-stat chem 8, ED (not at [facility] or [facility])     Status: Abnormal   Collection Time: 08/30/19  8:36 PM  Result Value Ref Range   Sodium 139 135 - 145 mmol/L   Potassium 2.5 (LL) 3.5 - 5.1 mmol/L   Chloride 100 98 - 111 mmol/L   BUN 30 (H) 6 - 20 mg/dL   Creatinine, Ser 1.70 (H) 0.61 - 1.24 mg/dL   Glucose, Bld 91 70 - 99 mg/dL   Calcium, Ion 1.02 (L) 1.15 - 1.40 mmol/L   TCO2 25 22 - 32 mmol/L   Hemoglobin 16.3 13.0 - 17.0 g/dL   HCT 48.0 39.0 - 52.0 %   Comment NOTIFIED PHYSICIAN   CBG monitoring, ED     Status: None   Collection Time: 08/30/19  8:56 PM  Result Value Ref Range   Glucose-Capillary 88 70 - 99 mg/dL  Respiratory Panel by RT PCR (Flu A&B, Covid) - Nasopharyngeal Swab     Status: None   Collection Time: 08/30/19  9:10 PM   Specimen: Nasopharyngeal Swab  Result Value Ref Range   SARS Coronavirus 2 by RT PCR NEGATIVE NEGATIVE    Comment: (NOTE) SARS-CoV-2 target nucleic acids are  NOT DETECTED. The SARS-CoV-2 RNA is generally detectable in upper respiratoy specimens during the acute phase of infection. The lowest concentration of SARS-CoV-2 viral copies this assay can detect is 131 copies/mL. A negative result does not preclude SARS-Cov-2 infection and should not be used as the  sole basis for treatment or other patient management decisions. A negative result may occur with  improper specimen collection/handling, submission of specimen other than nasopharyngeal swab, presence of viral mutation(s) within the areas targeted by this assay, and inadequate number of viral copies (<131 copies/mL). A negative result must be combined with clinical observations, patient history, and epidemiological information. The expected result is Negative. Fact Sheet for Patients:  PinkCheek.be Fact Sheet for Healthcare Providers:  GravelBags.it This test is not yet ap proved or cleared by the [place] FDA and  has been authorized for detection and/or diagnosis of SARS-CoV-2 by FDA under an Emergency Use Authorization (EUA). This EUA will remain  in effect (meaning this test can be used) for the duration of the COVID-19 declaration under Section 564(b)(1) of the Act, 21 U.S.C. section 360bbb-3(b)(1), unless the authorization is terminated or revoked sooner.    Influenza A by PCR NEGATIVE NEGATIVE   Influenza B by PCR NEGATIVE NEGATIVE    Comment: (NOTE) The Xpert Xpress SARS-CoV-2/FLU/RSV assay is intended as an aid in  the diagnosis of influenza from Nasopharyngeal swab specimens and  should not be used as a sole basis for treatment. Nasal washings and  aspirates are unacceptable for Xpert Xpress SARS-CoV-2/FLU/RSV  testing. Fact Sheet for Patients: PinkCheek.be Fact Sheet for Healthcare Providers: GravelBags.it This test is not yet approved or cleared by the  [place] FDA and  has been authorized for detection and/or diagnosis of SARS-CoV-2 by  FDA under an Emergency Use Authorization (EUA). This EUA will remain  in effect (meaning this test can be used) for the duration of the  Covid-19 declaration under Section 564(b)(1) of the Act, 21  U.S.C. section 360bbb-3(b)(1), unless the authorization is  terminated or revoked. Performed at [facility], [place] [address] [place], [place] [address]   I-STAT 7, (LYTES, BLD GAS, ICA, H+H)     Status: Abnormal   Collection Time: 08/30/19  9:21 PM  Result Value Ref Range   pH, Arterial 7.228 (L) 7.350 - 7.450   pCO2 arterial 62.0 (H) 32.0 - 48.0 mmHg   pO2, Arterial 102.0 83.0 - 108.0 mmHg   Bicarbonate 25.9 20.0 - 28.0 mmol/L   TCO2 28 22 - 32 mmol/L   O2 Saturation 96.0 %   Acid-base deficit 3.0 (H) 0.0 - 2.0 mmol/L   Sodium 139 135 - 145 mmol/L   Potassium 2.4 (LL) 3.5 - 5.1 mmol/L   Calcium, Ion 1.12 (L) 1.15 - 1.40 mmol/L   HCT 43.0 39.0 - 52.0 %   Hemoglobin 14.6 13.0 - 17.0 g/dL   Patient temperature 98.4 F    Collection site RADIAL, ALLEN'S TEST ACCEPTABLE    Drawn by Operator    Sample type ARTERIAL    Comment NOTIFIED PHYSICIAN   Troponin I (High Sensitivity)     Status: Abnormal   Collection Time: 08/31/19 12:37 AM  Result Value Ref Range   Troponin I (High Sensitivity) 38 (H) <18 ng/L    Comment: (NOTE) Elevated high sensitivity troponin I (hsTnI) values and significant  changes across serial measurements may suggest ACS but many other  chronic and acute conditions are known to elevate hsTnI results.  Refer to the "Links" section for chest pain algorithms and additional  guidance. Performed at [facility], [place] [address], [place], [place] [address]   CBC with Differential     Status: Abnormal   Collection Time: 08/31/19 12:37 AM  Result Value Ref Range   WBC  9.6 4.0 - 10.5 K/uL   RBC 4.45 4.22 - 5.81 MIL/uL   Hemoglobin 13.8 13.0 - 17.0 g/dL   HCT 46.8 39.0  - 52.0 %   MCV 105.2 (H) 80.0 - 100.0 fL   MCH 31.0 26.0 - 34.0 pg   MCHC 29.5 (L) 30.0 - 36.0 g/dL   RDW 16.6 (H) 11.5 - 15.5 %   Platelets 124 (L) 150 - 400 K/uL   nRBC 0.0 0.0 - 0.2 %   Neutrophils Relative % 83 %   Neutro Abs 8.0 (H) 1.7 - 7.7 K/uL   Lymphocytes Relative 7 %   Lymphs Abs 0.6 (L) 0.7 - 4.0 K/uL   Monocytes Relative 8 %   Monocytes Absolute 0.8 0.1 - 1.0 K/uL   Eosinophils Relative 1 %   Eosinophils Absolute 0.1 0.0 - 0.5 K/uL   Basophils Relative 0 %   Basophils Absolute 0.0 0.0 - 0.1 K/uL   Immature Granulocytes 1 %   Abs Immature Granulocytes 0.07 0.00 - 0.07 K/uL    Comment: Performed at [facility], [place] [address], [place], [place] [address]  Procalcitonin - Baseline     Status: None   Collection Time: 08/31/19 12:37 AM  Result Value Ref Range   Procalcitonin 0.32 ng/mL    Comment:        Interpretation: PCT (Procalcitonin) <= 0.5 ng/mL: Systemic infection (sepsis) is not likely. Local bacterial infection is possible. (NOTE)       Sepsis PCT Algorithm           Lower Respiratory Tract                                      Infection PCT Algorithm    ----------------------------     ----------------------------         PCT < 0.25 ng/mL                PCT < 0.10 ng/mL         Strongly encourage             Strongly discourage   discontinuation of antibiotics    initiation of antibiotics    ----------------------------     -----------------------------       PCT 0.25 - 0.50 ng/mL            PCT 0.10 - 0.25 ng/mL               OR       >80% decrease in PCT            Discourage initiation of                                            antibiotics      Encourage discontinuation           of antibiotics    ----------------------------     -----------------------------         PCT >= 0.50 ng/mL              PCT 0.26 - 0.50 ng/mL               AND        <80% decrease in PCT  Encourage initiation of                                              antibiotics       Encourage continuation           of antibiotics    ----------------------------     -----------------------------        PCT >= 0.50 ng/mL                  PCT > 0.50 ng/mL               AND         increase in PCT                  Strongly encourage                                      initiation of antibiotics    Strongly encourage escalation           of antibiotics                                     -----------------------------                                           PCT <= 0.25 ng/mL                                                 OR                                        > 80% decrease in PCT                                     Discontinue / Do not initiate                                             antibiotics Performed at [facility], [address], [place], [place] [address]   Basic metabolic panel     Status: Abnormal   Collection Time: 08/31/19 12:37 AM  Result Value Ref Range   Sodium 142 135 - 145 mmol/L   Potassium 2.6 (LL) 3.5 - 5.1 mmol/L    Comment: CRITICAL RESULT CALLED TO, READ BACK BY AND VERIFIED WITH: CHRISCOE C,RN 08/31/19 0140 WAYK    Chloride 103 98 - 111 mmol/L   CO2 20 (L) 22 - 32 mmol/L   Glucose, Bld 64 (L) 70 - 99 mg/dL   BUN 27 (H) 6 - 20 mg/dL   Creatinine, Ser 1.62 (H) 0.61 - 1.24 mg/dL   Calcium 8.1 (L) 8.9 -  10.3 mg/dL   GFR calc non Af Amer 46 (L) >60 mL/min   GFR calc Af Amer 54 (L) >60 mL/min   Anion gap 19 (H) 5 - 15    Comment: Performed at [facility] [address] [place], [place] [address]  I-STAT 7, (LYTES, BLD GAS, ICA, H+H)     Status: Abnormal   Collection Time: 08/31/19 12:51 AM  Result Value Ref Range   pH, Arterial 7.351 7.350 - 7.450   pCO2 arterial 36.7 32.0 - 48.0 mmHg   pO2, Arterial 121.0 (H) 83.0 - 108.0 mmHg   Bicarbonate 20.3 20.0 - 28.0 mmol/L   TCO2 21 (L) 22 - 32 mmol/L   O2 Saturation 99.0 %   Acid-base deficit 5.0 (H) 0.0 - 2.0 mmol/L   Sodium 141 135 - 145 mmol/L    Potassium 2.5 (LL) 3.5 - 5.1 mmol/L   Calcium, Ion 1.04 (L) 1.15 - 1.40 mmol/L   HCT 39.0 39.0 - 52.0 %   Hemoglobin 13.3 13.0 - 17.0 g/dL   Patient temperature 98.6 F    Collection site RADIAL, ALLEN'S TEST ACCEPTABLE    Drawn by Operator    Sample type ARTERIAL    Comment NOTIFIED PHYSICIAN   Urine rapid drug screen (hosp performed)     Status: Abnormal   Collection Time: 08/31/19  1:48 AM  Result Value Ref Range   Opiates NONE DETECTED NONE DETECTED   Cocaine NONE DETECTED NONE DETECTED   Benzodiazepines POSITIVE (A) NONE DETECTED   Amphetamines NONE DETECTED NONE DETECTED   Tetrahydrocannabinol NONE DETECTED NONE DETECTED   Barbiturates NONE DETECTED NONE DETECTED    Comment: (NOTE) DRUG SCREEN FOR MEDICAL PURPOSES ONLY.  IF CONFIRMATION IS NEEDED FOR ANY PURPOSE, NOTIFY LAB WITHIN 5 DAYS. LOWEST DETECTABLE LIMITS FOR URINE DRUG SCREEN Drug Class                     Cutoff (ng/mL) Amphetamine and metabolites    1000 Barbiturate and metabolites    200 Benzodiazepine                 774 Tricyclics and metabolites     300 Opiates and metabolites        300 Cocaine and metabolites        300 THC                            50 Performed at [facility], [place] [address], [place], [place] [address]   Urinalysis, Routine w reflex microscopic     Status: Abnormal   Collection Time: 08/31/19  1:48 AM  Result Value Ref Range   Color, Urine YELLOW YELLOW   APPearance CLEAR CLEAR   Specific Gravity, Urine 1.013 1.005 - 1.030   pH 7.0 5.0 - 8.0   Glucose, UA NEGATIVE NEGATIVE mg/dL   Hgb urine dipstick NEGATIVE NEGATIVE   Bilirubin Urine NEGATIVE NEGATIVE   Ketones, ur 20 (A) NEGATIVE mg/dL   Protein, ur 30 (A) NEGATIVE mg/dL   Nitrite NEGATIVE NEGATIVE   Leukocytes,Ua NEGATIVE NEGATIVE   RBC / HPF 0-5 0 - 5 RBC/hpf   WBC, UA 0-5 0 - 5 WBC/hpf   Bacteria, UA NONE SEEN NONE SEEN   Squamous Epithelial / LPF 0-5 0 - 5   Mucus PRESENT    Hyaline Casts, UA PRESENT      Comment: Performed at [facility], [address] [place], [place] [address]  Culture, blood (routine x 2)     Status: None (Preliminary result)   Collection Time: 08/31/19  2:18 AM   Specimen: BLOOD  Result Value Ref Range   Specimen Description BLOOD LEFT HAND    Special Requests      BOTTLES DRAWN AEROBIC ONLY Blood Culture results may not be optimal due to an inadequate volume of blood received in culture bottles   Culture      NO GROWTH 1 DAY Performed at [facility] [address] [place], [place] [address]    Report Status PENDING   Glucose, capillary     Status: Abnormal   Collection Time: 08/31/19  2:24 AM  Result Value Ref Range   Glucose-Capillary 48 (L) 70 - 99 mg/dL   Comment 1 Notify RN   Culture, blood (routine x 2)     Status: None (Preliminary result)   Collection Time: 08/31/19  2:38 AM   Specimen: BLOOD  Result Value Ref Range   Specimen Description BLOOD LEFT HAND    Special Requests      BOTTLES DRAWN AEROBIC ONLY Blood Culture results may not be optimal due to an inadequate volume of blood received in culture bottles   Culture      NO GROWTH 1 DAY Performed at [facility] [address] [place] [address], [place], [place] [address]    Report Status PENDING   Lactic acid, plasma     Status: None   Collection Time: 08/31/19  2:38 AM  Result Value Ref Range   Lactic Acid, Venous 1.3 0.5 - 1.9 mmol/L    Comment: Performed at [facility] [address] [place], [place] [address]  Glucose, capillary     Status: Abnormal   Collection Time: 08/31/19  2:51 AM  Result Value Ref Range   Glucose-Capillary 125 (H) 70 - 99 mg/dL  I-STAT 7, (LYTES, BLD GAS, ICA, H+H)     Status: Abnormal   Collection Time: 08/31/19  3:29 AM  Result Value Ref Range   pH, Arterial 7.283 (L) 7.350 - 7.450   pCO2 arterial 39.4 32.0 - 48.0 mmHg   pO2, Arterial 68.0 (L) 83.0 - 108.0 mmHg   Bicarbonate 18.6 (L) 20.0 - 28.0 mmol/L   TCO2 20 (L) 22 - 32 mmol/L   O2  Saturation 91.0 %   Acid-base deficit 8.0 (H) 0.0 - 2.0 mmol/L   Sodium 141 135 - 145 mmol/L   Potassium 2.4 (LL) 3.5 - 5.1 mmol/L   Calcium, Ion 1.14 (L) 1.15 - 1.40 mmol/L   HCT 37.0 (L) 39.0 - 52.0 %   Hemoglobin 12.6 (L) 13.0 - 17.0 g/dL   Patient temperature 98.4 F    Collection site RADIAL, ALLEN'S TEST ACCEPTABLE    Drawn by Operator    Sample type ARTERIAL    Comment NOTIFIED PHYSICIAN   Heparin level (unfractionated)     Status: Abnormal   Collection Time: 08/31/19  5:10 AM  Result Value Ref Range   Heparin Unfractionated 1.28 (H) 0.30 - 0.70 IU/mL    Comment: RESULTS CONFIRMED BY MANUAL DILUTION (NOTE) If heparin results are below expected values, and patient dosage has  been confirmed, suggest follow up testing of antithrombin III levels. Performed at [facility], [place] [address] [place], [place] [address]   Lactic acid, plasma     Status: None   Collection Time: 08/31/19  5:10 AM  Result Value Ref Range  Lactic Acid, Venous 1.1 0.5 - 1.9 mmol/L    Comment: Performed at [facility], [place] [address], [place], [place] [address]  Vitamin B12     Status: None   Collection Time: 08/31/19  5:10 AM  Result Value Ref Range   Vitamin B-12 334 180 - 914 pg/mL    Comment: (NOTE) This assay is not validated for testing neonatal or myeloproliferative syndrome specimens for Vitamin B12 levels. Performed at [facility], [place] [address], [place], [place] [address]   TSH     Status: Abnormal   Collection Time: 08/31/19  5:10 AM  Result Value Ref Range   TSH 5.690 (H) 0.350 - 4.500 uIU/mL    Comment: Performed by a 3rd Generation assay with a functional sensitivity of <=0.01 uIU/mL. Performed at  [facility], [place] [address], [place], [place] [address]   Basic metabolic panel     Status: Abnormal   Collection Time: 08/31/19  5:10 AM  Result Value Ref Range   Sodium 139 135 - 145 mmol/L   Potassium 3.0 (L) 3.5 - 5.1 mmol/L   Chloride 101 98 - 111  mmol/L   CO2 20 (L) 22 - 32 mmol/L   Glucose, Bld 67 (L) 70 - 99 mg/dL   BUN 24 (H) 6 - 20 mg/dL   Creatinine, Ser 1.40 (H) 0.61 - 1.24 mg/dL   Calcium 7.3 (L) 8.9 - 10.3 mg/dL   GFR calc non Af Amer 55 (L) >60 mL/min   GFR calc Af Amer >60 >60 mL/min   Anion gap 18 (H) 5 - 15    Comment: Performed at [facility] [address] [place], [place] [address]  Ammonia     Status: Abnormal   Collection Time: 08/31/19  5:10 AM  Result Value Ref Range   Ammonia 68 (H) 9 - 35 umol/L    Comment: Performed at [facility], [place] [address], [place], [place] [address]  Lipase, blood     Status: Abnormal   Collection Time: 08/31/19  5:10 AM  Result Value Ref Range   Lipase 60 (H) 11 - 51 U/L    Comment: Performed at [facility] [address], [place], [place] [address]  Glucose, capillary     Status: None   Collection Time: 08/31/19  7:27 AM  Result Value Ref Range   Glucose-Capillary 71 70 - 99 mg/dL  I-STAT 7, (LYTES, BLD GAS, ICA, H+H)     Status: Abnormal   Collection Time: 08/31/19  8:29 AM  Result Value Ref Range   pH, Arterial 7.351 7.350 - 7.450   pCO2 arterial 38.2 32.0 - 48.0 mmHg   pO2, Arterial 71.0 (L) 83.0 - 108.0 mmHg   Bicarbonate 21.0 20.0 - 28.0 mmol/L   TCO2 22 22 - 32 mmol/L   O2 Saturation 93.0 %   Acid-base deficit 4.0 (H) 0.0 - 2.0 mmol/L   Sodium 143 135 - 145 mmol/L   Potassium 2.7 (LL) 3.5 - 5.1 mmol/L   Calcium, Ion 1.09 (L) 1.15 - 1.40 mmol/L   HCT 34.0 (L) 39.0 - 52.0 %   Hemoglobin 11.6 (L) 13.0 - 17.0 g/dL   Patient temperature 37.4 C    Collection site RADIAL, ALLEN'S TEST ACCEPTABLE    Drawn by Operator    Sample type ARTERIAL    Comment NOTIFIED PHYSICIAN   Phosphorus     Status: None   Collection Time: 08/31/19  9:17 AM  Result Value Ref Range   Phosphorus 3.8 2.5 - 4.6 mg/dL    Comment: Performed at [facility] [address], [place], [place] [address]  Magnesium     Status: None   Collection Time: 08/31/19  9:17  AM  Result Value Ref Range   Magnesium 1.9 1.7 - 2.4 mg/dL    Comment: Performed at [facility] [address] [place], [place] [address]  CK     Status: None   Collection Time: 08/31/19  9:17 AM  Result Value Ref Range   Total CK 215 49 - 397 U/L    Comment: Performed at [facility], [place] [address], [place], [place] [address]  Basic metabolic panel     Status: Abnormal   Collection Time: 08/31/19  9:17 AM  Result Value Ref Range   Sodium 142 135 - 145 mmol/L   Potassium 2.7 (LL) 3.5 - 5.1 mmol/L    Comment: CRITICAL RESULT CALLED TO, READ BACK BY AND VERIFIED WITH: J.GLOSSON RN @ 1011 08/30/2018 BY C.EDENS    Chloride 107 98 - 111 mmol/L   CO2 18 (L) 22 - 32 mmol/L   Glucose, Bld 87 70 - 99 mg/dL   BUN 26 (H) 6 - 20 mg/dL   Creatinine, Ser 1.71 (H) 0.61 - 1.24 mg/dL   Calcium 7.4 (L) 8.9 - 10.3 mg/dL   GFR calc non Af Amer 43 (L) >60 mL/min   GFR calc Af Amer 50 (L) >60 mL/min   Anion gap 17 (H) 5 - 15    Comment: Performed at [facility], [place] [address] [place], [place] [address]  D-dimer, quantitative (not at [facility])     Status: Abnormal   Collection Time: 08/31/19  9:17 AM  Result Value Ref Range   D-Dimer, Quant 1.57 (H) 0.00 - 0.50 ug/mL-FEU    Comment: (NOTE) At the manufacturer cut-off of 0.50 ug/mL FEU, this assay has been documented to exclude PE with a sensitivity and negative predictive value of 97 to 99%.  At this time, this assay has not been approved by the FDA to exclude DVT/VTE. Results should be correlated with clinical presentation. Performed at [facility], [place] [address], [place], [place] [address]   Glucose, capillary     Status: None   Collection Time: 08/31/19 10:58 AM  Result Value Ref Range   Glucose-Capillary 96 70 - 99 mg/dL  Basic metabolic panel     Status: Abnormal   Collection Time: 08/31/19  1:29 PM  Result Value Ref Range   Sodium 138 135 - 145 mmol/L   Potassium 3.2 (L) 3.5 - 5.1 mmol/L   Chloride 105 98 -  111 mmol/L   CO2 18 (L) 22 - 32 mmol/L   Glucose, Bld 111 (H) 70 - 99 mg/dL   BUN 26 (H) 6 - 20 mg/dL   Creatinine, Ser 1.60 (H) 0.61 - 1.24 mg/dL   Calcium 7.8 (L) 8.9 - 10.3 mg/dL   GFR calc non Af Amer 47 (L) >60 mL/min   GFR calc Af Amer 55 (L) >60 mL/min   Anion gap 15 5 - 15    Comment: Performed at [facility] [address], [place], [place] [address]  Heparin level (unfractionated)     Status: None   Collection Time: 08/31/19  3:25 PM  Result Value Ref Range   Heparin Unfractionated 0.50 0.30 - 0.70 IU/mL    Comment: (NOTE) If heparin  results are below expected values, and patient dosage has  been confirmed, suggest follow up testing of antithrombin III levels. Performed at [facility], [place] [address] [place], [place] [address]   Glucose, capillary     Status: None   Collection Time: 08/31/19  3:41 PM  Result Value Ref Range   Glucose-Capillary 85 70 - 99 mg/dL  Glucose, capillary     Status: None   Collection Time: 08/31/19  7:31 PM  Result Value Ref Range   Glucose-Capillary 87 70 - 99 mg/dL  Basic metabolic panel     Status: Abnormal   Collection Time: 08/31/19  8:41 PM  Result Value Ref Range   Sodium 138 135 - 145 mmol/L   Potassium 2.9 (L) 3.5 - 5.1 mmol/L   Chloride 106 98 - 111 mmol/L   CO2 16 (L) 22 - 32 mmol/L   Glucose, Bld 88 70 - 99 mg/dL   BUN 24 (H) 6 - 20 mg/dL   Creatinine, Ser 1.56 (H) 0.61 - 1.24 mg/dL   Calcium 7.5 (L) 8.9 - 10.3 mg/dL   GFR calc non Af Amer 49 (L) >60 mL/min   GFR calc Af Amer 56 (L) >60 mL/min   Anion gap 16 (H) 5 - 15    Comment: Performed at [facility], [place] [address], [place], [place] [address]  Heparin level (unfractionated)     Status: None   Collection Time: 08/31/19 10:59 PM  Result Value Ref Range   Heparin Unfractionated 0.46 0.30 - 0.70 IU/mL    Comment: (NOTE) If heparin results are below expected values, and patient dosage has  been confirmed, suggest follow up testing of antithrombin  III levels. Performed at [facility], [place] [address], [place], [place] [address]   Glucose, capillary     Status: None   Collection Time: 08/31/19 11:38 PM  Result Value Ref Range   Glucose-Capillary 73 70 - 99 mg/dL  Basic metabolic panel     Status: Abnormal   Collection Time: 09/01/19  2:49 AM  Result Value Ref Range   Sodium 142 135 - 145 mmol/L   Potassium 2.9 (L) 3.5 - 5.1 mmol/L   Chloride 112 (H) 98 - 111 mmol/L   CO2 14 (L) 22 - 32 mmol/L   Glucose, Bld 81 70 - 99 mg/dL   BUN 22 (H) 6 - 20 mg/dL   Creatinine, Ser 1.57 (H) 0.61 - 1.24 mg/dL   Calcium 7.9 (L) 8.9 - 10.3 mg/dL   GFR calc non Af Amer 48 (L) >60 mL/min   GFR calc Af Amer 56 (L) >60 mL/min   Anion gap 16 (H) 5 - 15    Comment: Performed at [facility] [address], [place], [place] [address]  Heparin level (unfractionated)     Status: None   Collection Time: 09/01/19  2:49 AM  Result Value Ref Range   Heparin Unfractionated 0.41 0.30 - 0.70 IU/mL    Comment: (NOTE) If heparin results are below expected values, and patient dosage has  been confirmed, suggest follow up testing of antithrombin III levels. Performed at [facility], [place] [address] [place] [address]   CBC     Status: Abnormal   Collection Time: 09/01/19  2:49 AM  Result Value Ref Range   WBC 7.9 4.0 - 10.5 K/uL   RBC 3.31 (L) 4.22 - 5.81 MIL/uL   Hemoglobin 10.4 (L) 13.0 - 17.0 g/dL  HCT 34.4 (L) 39.0 - 52.0 %   MCV 103.9 (H) 80.0 - 100.0 fL   MCH 31.4 26.0 - 34.0 pg   MCHC 30.2 30.0 - 36.0 g/dL   RDW 17.3 (H) 11.5 - 15.5 %   Platelets 116 (L) 150 - 400 K/uL    Comment: REPEATED TO VERIFY PLATELET COUNT CONFIRMED BY SMEAR Immature Platelet Fraction may be clinically indicated, consider ordering this additional test [id number]    nRBC 0.0 0.0 - 0.2 %    Comment: Performed at [facility] [address] [place], [place] [address]  Procalcitonin     Status: None   Collection Time: 09/01/19   2:49 AM  Result Value Ref Range   Procalcitonin 0.12 ng/mL    Comment:        Interpretation: PCT (Procalcitonin) <= 0.5 ng/mL: Systemic infection (sepsis) is not likely. Local bacterial infection is possible. (NOTE)       Sepsis PCT Algorithm           Lower Respiratory Tract                                      Infection PCT Algorithm    ----------------------------     ----------------------------         PCT < 0.25 ng/mL                PCT < 0.10 ng/mL         Strongly encourage             Strongly discourage   discontinuation of antibiotics    initiation of antibiotics    ----------------------------     -----------------------------       PCT 0.25 - 0.50 ng/mL            PCT 0.10 - 0.25 ng/mL               OR       >80% decrease in PCT            Discourage initiation of                                            antibiotics      Encourage discontinuation           of antibiotics    ----------------------------     -----------------------------         PCT >= 0.50 ng/mL              PCT 0.26 - 0.50 ng/mL               AND        <80% decrease in PCT             Encourage initiation of                                             antibiotics       Encourage continuation           of antibiotics    ----------------------------     -----------------------------        PCT >= 0.50 ng/mL  PCT > 0.50 ng/mL               AND         increase in PCT                  Strongly encourage                                      initiation of antibiotics    Strongly encourage escalation           of antibiotics                                     -----------------------------                                           PCT <= 0.25 ng/mL                                                 OR                                        > 80% decrease in PCT                                     Discontinue / Do not initiate                                              antibiotics Performed at [facility], [address] [place], [place] [address]   Glucose, capillary     Status: None   Collection Time: 09/01/19  3:36 AM  Result Value Ref Range   Glucose-Capillary 78 70 - 99 mg/dL  Glucose, capillary     Status: None   Collection Time: 09/01/19  7:04 AM  Result Value Ref Range   Glucose-Capillary 90 70 - 99 mg/dL    Recent Results (from the past 240 hour(s))  Respiratory Panel by RT PCR (Flu A&B, Covid) - Nasopharyngeal Swab     Status: None   Collection Time: 08/30/19  9:10 PM   Specimen: Nasopharyngeal Swab  Result Value Ref Range Status   SARS Coronavirus 2 by RT PCR NEGATIVE NEGATIVE Final    Comment: (NOTE) SARS-CoV-2 target nucleic acids are NOT DETECTED. The SARS-CoV-2 RNA is generally detectable in upper respiratoy specimens during the acute phase of infection. The lowest concentration of SARS-CoV-2 viral copies this assay can detect is 131 copies/mL. A negative result does not preclude SARS-Cov-2 infection and should not be used as the sole basis for treatment or other patient management decisions. A negative result may occur with  improper specimen collection/handling, submission of specimen other than nasopharyngeal swab, presence of viral mutation(s) within the areas targeted by this assay, and inadequate number of  viral copies (<131 copies/mL). A negative result must be combined with clinical observations, patient history, and epidemiological information. The expected result is Negative. Fact Sheet for Patients:  PinkCheek.be Fact Sheet for Healthcare Providers:  GravelBags.it This test is not yet ap proved or cleared by the [place] FDA and  has been authorized for detection and/or diagnosis of SARS-CoV-2 by FDA under an Emergency Use Authorization (EUA). This EUA will remain  in effect (meaning this test can be used) for the duration of the COVID-19  declaration under Section 564(b)(1) of the Act, 21 U.S.C. section 360bbb-3(b)(1), unless the authorization is terminated or revoked sooner.    Influenza A by PCR NEGATIVE NEGATIVE Final   Influenza B by PCR NEGATIVE NEGATIVE Final    Comment: (NOTE) The Xpert Xpress SARS-CoV-2/FLU/RSV assay is intended as an aid in  the diagnosis of influenza from Nasopharyngeal swab specimens and  should not be used as a sole basis for treatment. Nasal washings and  aspirates are unacceptable for Xpert Xpress SARS-CoV-2/FLU/RSV  testing. Fact Sheet for Patients: PinkCheek.be Fact Sheet for Healthcare Providers: GravelBags.it This test is not yet approved or cleared by the [place] FDA and  has been authorized for detection and/or diagnosis of SARS-CoV-2 by  FDA under an Emergency Use Authorization (EUA). This EUA will remain  in effect (meaning this test can be used) for the duration of the  Covid-19 declaration under Section 564(b)(1) of the Act, 21  U.S.C. section 360bbb-3(b)(1), unless the authorization is  terminated or revoked. Performed at [facility], [place] [address], [place], [place] [address]   Culture, blood (routine x 2)     Status: None (Preliminary result)   Collection Time: 08/31/19  2:18 AM   Specimen: BLOOD  Result Value Ref Range Status   Specimen Description BLOOD LEFT HAND  Final   Special Requests   Final    BOTTLES DRAWN AEROBIC ONLY Blood Culture results may not be optimal due to an inadequate volume of blood received in culture bottles   Culture   Final    NO GROWTH 1 DAY Performed at [facility], [place] [address] [place], [place] [address]    Report Status PENDING  Incomplete  Culture, blood (routine x 2)     Status: None (Preliminary result)   Collection Time: 08/31/19  2:38 AM   Specimen: BLOOD  Result Value Ref Range Status   Specimen Description BLOOD LEFT HAND  Final   Special Requests    Final    BOTTLES DRAWN AEROBIC ONLY Blood Culture results may not be optimal due to an inadequate volume of blood received in culture bottles   Culture   Final    NO GROWTH 1 DAY Performed at [facility], [place] [address], [place],  [address]    Report Status PENDING  Incomplete    Lipid Panel No results for input(s): CHOL, TRIG, HDL, CHOLHDL, VLDL, LDLCALC in the last 72 hours.  Studies/Results: EEG  Result Date: 08/31/2019 Lora Havens, MD     08/31/2019  6:53 PM Patient Name: Justin Bernard MRN: [id number] Epilepsy Attending: Lora Havens Referring Physician/Provider: Dr Kerney Elbe Date: 08/31/2019 Duration: 23.28 mins Patient history: 59yo M with h/o alcohol use, who was found pulseless by EMS sp COR for 2 mins with ROSC. Upon awakening, he had some stiffening/tonic posturing. EEG to evaluate for seizure Level of alertness: awake, asleep/sedated AEDs during EEG study: Ativan Technical aspects: This EEG study was  done with scalp electrodes positioned according to the 10-20 International system of electrode placement. Electrical activity was acquired at a sampling rate of '500Hz'$  and reviewed with a high frequency filter of '70Hz'$  and a low frequency filter of '1Hz'$ . EEG data were recorded continuously and digitally stored. DESCRIPTION: During awake state, no clear posterior dominant rhythm was seen.  EEG showed continuous generalized 3-'5hz'$  theta-delta slowing admixed with 13-'15Hz'$  beta activity, maximal frontocentral. Hyperventilation and photic stimulation were not performed. ABNORMALITY - Continuous slow, generalized IMPRESSION: This study is suggestive of moderate diffuse encephalopathy, non specific to etiology but could be secondary to sedation, benzodiazepine use. No seizures or epileptiform discharges were seen throughout the recording. Lora Havens   CT Head Wo Contrast  Result Date: 08/30/2019 CLINICAL DATA:  Seizure, nontraumatic (Age >= 72y) EXAM: CT HEAD  WITHOUT CONTRAST TECHNIQUE: Contiguous axial images were obtained from the base of the skull through the vertex without intravenous contrast. COMPARISON:  Head CT 08/18/2016 FINDINGS: Brain: No intracranial hemorrhage, mass effect, or midline shift. Mild generalized atrophy, stable from prior. No hydrocephalus. Chronic pineal calcifications, incidental. The basilar cisterns are patent. No evidence of territorial infarct or acute ischemia. No extra-axial or intracranial fluid collection. Vascular: No hyperdense vessel or unexpected calcification. Skull: No fracture or focal lesion. Sinuses/Orbits: Small mucous retention cyst in the left maxillary sinus. Paranasal sinuses are otherwise clear. Postsurgical change of the maxillary sinuses anteriorly. Bilateral cataract resection. Other: Small calcification in the right posterior scalp, unchanged. IMPRESSION: No acute intracranial abnormality. Electronically Signed   By: Keith Rake M.D.   On: 08/30/2019 21:05   DG Chest Portable 1 View  Result Date: 08/31/2019 CLINICAL DATA:  Status post intubation EXAM: PORTABLE CHEST 1 VIEW COMPARISON:  Film from the previous day FINDINGS: Cardiac shadow is enlarged but stable. Defibrillator is again noted. Endotracheal tube and gastric catheter are now seen. Gastric catheter lies with the tip just below the gastroesophageal junction. The proximal side port is noted in the distal esophagus. This could be advanced several cm. The lungs are well aerated with bibasilar atelectatic changes. No pneumothorax is seen. IMPRESSION: Tubes and lines as described above. The gastric catheter could be advanced several cm further into the stomach. Electronically Signed   By: Inez Catalina M.D.   On: 08/31/2019 00:39   DG Chest Portable 1 View  Result Date: 08/30/2019 CLINICAL DATA:  Post CPR. EXAM: PORTABLE CHEST 1 VIEW COMPARISON:  06/02/2019 FINDINGS: Left AICD remains in place, unchanged. Cardiomegaly, vascular congestion. Bibasilar  atelectasis. No effusions or acute bony abnormality. IMPRESSION: Cardiomegaly, vascular congestion.  Bibasilar atelectasis. Electronically Signed   By: Rolm Baptise M.D.   On: 08/30/2019 21:10   DG Shoulder Right Port  Result Date: 08/31/2019 CLINICAL DATA:  Possible wound infection EXAM: PORTABLE RIGHT SHOULDER COMPARISON:  06/02/2019 FINDINGS: No acute fracture or dislocation is noted. Chronic changes related to prior fracture and healing are seen. No soft tissue abnormality is noted. IMPRESSION: Chronic changes without acute abnormality. Electronically Signed   By: Inez Catalina M.D.   On: 08/31/2019 00:40   ECHOCARDIOGRAM COMPLETE  Result Date: 08/31/2019   ECHOCARDIOGRAM REPORT   Patient Name:   Justin Bernard [facility] Date of Exam: 08/31/2019 Medical Rec #:  [id number]            Height:       76.0 in Accession #:    [id number]           Weight:       258.0  lb Date of Birth:  May 17, 1961           BSA:          2.47 m Patient Age:    89 years             BP:           92/62 mmHg Patient Gender: M                    HR:           55 bpm. Exam Location:  Inpatient Procedure: 2D Echo Indications:    syncope 780.2  History:        Patient has prior history of Echocardiogram examinations, most                 recent 05/16/2019. CHF; Risk Factors:Diabetes.  Sonographer:    Johny Chess Referring Phys: [id number] LAURA R GLEASON  Sonographer Comments: Echo performed with patient supine and on artificial respirator. IMPRESSIONS  1. Left ventricular ejection fraction, by visual estimation, is 40 to 45%. The left ventricle has mildly decreased function. There is no left ventricular hypertrophy.  2. The left ventricle demonstrates global hypokinesis.  3. Global right ventricle has normal systolic function.The right ventricular size is normal. No increase in right ventricular wall thickness.  4. Left atrial size was mildly dilated.  5. Right atrial size was normal.  6. The mitral valve is normal in structure. Trivial  mitral valve regurgitation. No evidence of mitral stenosis.  7. The tricuspid valve is normal in structure.  8. The aortic valve is normal in structure. Aortic valve regurgitation is not visualized. No evidence of aortic valve sclerosis or stenosis.  9. The pulmonic valve was normal in structure. Pulmonic valve regurgitation is not visualized. 10. Normal pulmonary artery systolic pressure. 11. The inferior vena cava is normal in size with greater than 50% respiratory variability, suggesting right atrial pressure of 3 mmHg. 12. Compared to 05/16/2019, there are now Doppler signs of increased left atrial pressure. 13. Prior images reviewed side by side. FINDINGS  Left Ventricle: Left ventricular ejection fraction, by visual estimation, is 40 to 45%. The left ventricle has mildly decreased function. The left ventricle demonstrates global hypokinesis. There is no left ventricular hypertrophy. Normal left atrial pressure. Right Ventricle: The right ventricular size is normal. No increase in right ventricular wall thickness. Global RV systolic function is has normal systolic function. The tricuspid regurgitant velocity is 2.08 m/s, and with an assumed right atrial pressure  of 3 mmHg, the estimated right ventricular systolic pressure is normal at 20.3 mmHg. Left Atrium: Left atrial size was mildly dilated. Right Atrium: Right atrial size was normal in size Pericardium: There is no evidence of pericardial effusion. Mitral Valve: The mitral valve is normal in structure. Trivial mitral valve regurgitation. No evidence of mitral valve stenosis by observation. Tricuspid Valve: The tricuspid valve is normal in structure. Tricuspid valve regurgitation is trivial. Aortic Valve: The aortic valve is normal in structure. Aortic valve regurgitation is not visualized. The aortic valve is structurally normal, with no evidence of sclerosis or stenosis. Pulmonic Valve: The pulmonic valve was normal in structure. Pulmonic valve  regurgitation is not visualized. Pulmonic regurgitation is not visualized. Aorta: The aortic root, ascending aorta and aortic arch are all structurally normal, with no evidence of dilitation or obstruction. Venous: The inferior vena cava is normal in size with greater than 50% respiratory variability, suggesting right atrial pressure of 3 mmHg. IAS/Shunts:  No atrial level shunt detected by color flow Doppler. There is no evidence of a patent foramen ovale. No ventricular septal defect is seen or detected. There is no evidence of an atrial septal defect.  LEFT VENTRICLE PLAX 2D LVIDd:         5.40 cm  Diastology LVIDs:         4.79 cm  LV e' lateral:   8.00 cm/s LV PW:         0.90 cm  LV E/e' lateral: 7.7 LV IVS:        0.80 cm  LV e' medial:    4.00 cm/s LVOT diam:     2.40 cm  LV E/e' medial:  15.3 LV SV:         34 ml LV SV Index:   13.56 LVOT Area:     4.52 cm  RIGHT VENTRICLE RV S prime:     12.80 cm/s TAPSE (M-mode): 1.4 cm LEFT ATRIUM             Index       RIGHT ATRIUM           Index LA diam:        4.30 cm 1.74 cm/m  RA Area:     18.40 cm LA Vol (A2C):   43.7 ml 17.70 ml/m RA Volume:   46.80 ml  18.96 ml/m LA Vol (A4C):   78.3 ml 31.72 ml/m LA Biplane Vol: 64.3 ml 26.05 ml/m  AORTIC VALVE LVOT Vmax:   65.90 cm/s LVOT Vmean:  40.300 cm/s LVOT VTI:    0.143 m  AORTA Ao Root diam: 3.40 cm MITRAL VALVE                        TRICUSPID VALVE MV Area (PHT): 5.02 cm             TR Peak grad:   17.3 mmHg MV PHT:        43.79 msec           TR Vmax:        208.00 cm/s MV Decel Time: 151 msec MV E velocity: 61.30 cm/s 103 cm/s  SHUNTS MV A velocity: 42.00 cm/s 70.3 cm/s Systemic VTI:  0.14 m MV E/A ratio:  1.46       1.5       Systemic Diam: 2.40 cm  Dani Gobble Croitoru MD Electronically signed by Sanda Klein MD Signature Date/Time: 08/31/2019/3:43:29 PM    Final    VAS [place] LOWER EXTREMITY VENOUS (DVT)  Result Date: 08/31/2019  Lower Venous Study Indications: Ventilation, immobilzation. Other Indications:  Post CPR 08/30/19. Risk Factors: ETOH abuse. Limitations: Patient on ventilator, involuntary movement (flexing feet). Comparison Study: No prior study on file Performing Technologist: Sharion Dove RVS  Examination Guidelines: A complete evaluation includes B-mode imaging, spectral Doppler, color Doppler, and power Doppler as needed of all accessible portions of each vessel. Bilateral testing is considered an integral part of a complete examination. Limited examinations for reoccurring indications may be performed as noted.  +---------+---------------+---------+-----------+----------+-----------------+ RIGHT    CompressibilityPhasicitySpontaneityPropertiesThrombus Aging    +---------+---------------+---------+-----------+----------+-----------------+ CFV      Full           Yes      Yes                                    +---------+---------------+---------+-----------+----------+-----------------+  SFJ      Full                                                           +---------+---------------+---------+-----------+----------+-----------------+ FV Prox  Full                                                           +---------+---------------+---------+-----------+----------+-----------------+ FV Mid   Full                                                           +---------+---------------+---------+-----------+----------+-----------------+ FV DistalFull                                                           +---------+---------------+---------+-----------+----------+-----------------+ PFV      Full                                                           +---------+---------------+---------+-----------+----------+-----------------+ POP      Full           Yes      Yes                                    +---------+---------------+---------+-----------+----------+-----------------+ PTV      Partial                                      Age  Indeterminate +---------+---------------+---------+-----------+----------+-----------------+ PERO                                                  Not visualized    +---------+---------------+---------+-----------+----------+-----------------+   +---------+---------------+---------+-----------+----------+-----------------+ LEFT     CompressibilityPhasicitySpontaneityPropertiesThrombus Aging    +---------+---------------+---------+-----------+----------+-----------------+ CFV      Full           Yes      Yes                                    +---------+---------------+---------+-----------+----------+-----------------+ SFJ      Full                                                           +---------+---------------+---------+-----------+----------+-----------------+  FV Prox  Full                                                           +---------+---------------+---------+-----------+----------+-----------------+ FV Mid   Full                                                           +---------+---------------+---------+-----------+----------+-----------------+ FV DistalFull                                                           +---------+---------------+---------+-----------+----------+-----------------+ PFV      Full                                                           +---------+---------------+---------+-----------+----------+-----------------+ POP      Full           Yes      Yes                                    +---------+---------------+---------+-----------+----------+-----------------+ PTV      Full                                                           +---------+---------------+---------+-----------+----------+-----------------+ PERO     Partial                                      Age Indeterminate +---------+---------------+---------+-----------+----------+-----------------+     Summary: Right: There is no  evidence of deep vein thrombosis in the lower extremity. Left: There is no evidence of deep vein thrombosis in the lower extremity.  *See table(s) above for measurements and observations.    Preliminary     Medications:  Scheduled: . amiodarone  200 mg Oral Daily  . chlorhexidine gluconate (MEDLINE KIT)  15 mL Mouth Rinse BID  . Chlorhexidine Gluconate Cloth  6 each Topical Daily  . folic acid  1 mg Per Tube Daily  . LORazepam  1 mg Intravenous Q6H  . LORazepam  2 mg Intravenous Q6H  . mouth rinse  15 mL Mouth Rinse 10 times per day  . multivitamin with minerals  1 tablet Per Tube Daily  . pantoprazole (PROTONIX) IV  40 mg Intravenous Daily  . potassium chloride  40 mEq Per Tube Q4H  . thiamine  100 mg Oral Daily   Or  . thiamine  100 mg Intravenous Daily   Continuous: . sodium  chloride 100 mL/hr at 09/01/19 0600  .  ceFAZolin (ANCEF) IV 2 g (09/01/19 0629)  . dexmedetomidine (PRECEDEX) IV infusion 0.4 mcg/kg/hr (09/01/19 0600)  . 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 EMS had been called for requiring help getting the patient up after a fall but they could not locate a pulse.  CPR was done for about 2 minutes prior to ROSC. Upon awakening, he had some stiffening/tonic posturing with Versed given. He was completely unresponsive after that followed by gradual improvement in mentation. 1. Overall clinical picture most consistent with a syncopal convulsion.  2. Also presented with metabolic derangements 3. EEG performed yesterday was suggestive of moderate diffuse encephalopathy, non specific to etiology but could be secondary to benzodiazepine use. No seizures or epileptiform discharges were seen throughout the recording. 4. On exam today:  Patient on 4L [place], awake, alert, oriented. Tremors present in BUE. No myoclonic jerks  Recommendations: 1. Was dosed with Keppra in the ED. Based on negative EEG and no focal lesion seen on CT, long term anticonvulsant not indicated at this time.  2. CIWA protocol 3. Outpatient Neurology follow up for progressive chronic gait dysfunction. May also benefit from home PT evaluation and treatment.  4. Neurology will sign off at this time. Please call if there are additional questions.    LOS: 1 day    09/01/2019  7:29 AM

## 2019-09-01 NOTE — Progress Notes (Signed)
eLink Physician-Brief Progress Note Patient Name: Justin Bernard DOB: 03/05/1961 MRN: [id number]   Date of Service  09/01/2019  HPI/Events of Note  K+ persistently low despite several replacement attempts per protocol.  eICU Interventions  KCL 40 meq via OG tube Q 4 hours x 3 doses.        [name] U [name] 09/01/2019, 4:20 AM

## 2019-09-01 NOTE — Progress Notes (Signed)
Pharmacy Note  55 yom presenting s/p fall, syncopal event followed by a brief period when the patient was pulseless. Patient was initially started on cefazolin (day #3) on admit due to history of chronic R shoulder wound; however, CCM documented no evidence of osteomyelitis/cellulitis or purulent drainage. Right shoulder xray showed chronic changes without acute abnormality. All cultures are negative to date. Afebrile, WBC wnl, LA 1.1.  Plan Discussed with Dr. Marchelle Gearing - will d/c cefazolin without evidence of current infection and monitor off antibiotics for now   Babs Bertin, PharmD, BCPS Please check AMION for all [facility] contact numbers Clinical Pharmacist 09/01/2019 2:15 PM

## 2019-09-01 NOTE — Procedures (Signed)
Extubation Procedure Note  Patient Details:   Name: Justin Bernard DOB: 1961-07-31 MRN: [id number]   Airway Documentation:    Vent end date: 09/01/19 Vent end time: 0840   Evaluation  O2 sats: stable throughout Complications: No apparent complications Patient did tolerate procedure well. Bilateral Breath Sounds: Clear, Diminished   Patient extubated per MD order & placed on 4L [place]. Patient able to speak & cough post extubation. IS instructed.  Jacqulynn Cadet 09/01/2019, 8:44 AM

## 2019-09-02 ENCOUNTER — Other Ambulatory Visit: Payer: Self-pay

## 2019-09-02 DIAGNOSIS — I469 Cardiac arrest, cause unspecified: Secondary | ICD-10-CM

## 2019-09-02 DIAGNOSIS — E876 Hypokalemia: Secondary | ICD-10-CM

## 2019-09-02 DIAGNOSIS — J9601 Acute respiratory failure with hypoxia: Secondary | ICD-10-CM

## 2019-09-02 LAB — CBC
HCT: 39.4 % (ref 39.0–52.0)
HCT: 41.7 % (ref 39.0–52.0)
Hemoglobin: 11.8 g/dL — ABNORMAL LOW (ref 13.0–17.0)
Hemoglobin: 12.3 g/dL — ABNORMAL LOW (ref 13.0–17.0)
MCH: 32.1 pg (ref 26.0–34.0)
MCH: 31.5 pg (ref 26.0–34.0)
MCHC: 29.9 g/dL — ABNORMAL LOW (ref 30.0–36.0)
MCHC: 29.5 g/dL — ABNORMAL LOW (ref 30.0–36.0)
MCV: 107.1 fL — ABNORMAL HIGH (ref 80.0–100.0)
MCV: 106.6 fL — ABNORMAL HIGH (ref 80.0–100.0)
Platelets: 123 10*3/uL — ABNORMAL LOW (ref 150–400)
Platelets: 141 10*3/uL — ABNORMAL LOW (ref 150–400)
RBC: 3.68 MIL/uL — ABNORMAL LOW (ref 4.22–5.81)
RBC: 3.91 MIL/uL — ABNORMAL LOW (ref 4.22–5.81)
RDW: 18 % — ABNORMAL HIGH (ref 11.5–15.5)
RDW: 18.1 % — ABNORMAL HIGH (ref 11.5–15.5)
WBC: 5.5 10*3/uL (ref 4.0–10.5)
WBC: 6.3 10*3/uL (ref 4.0–10.5)
nRBC: 0 % (ref 0.0–0.2)
nRBC: 0 % (ref 0.0–0.2)

## 2019-09-02 LAB — GLUCOSE, CAPILLARY: Glucose-Capillary: 92 mg/dL (ref 70–99)

## 2019-09-02 LAB — COMPREHENSIVE METABOLIC PANEL
ALT: 30 U/L (ref 0–44)
AST: 90 U/L — ABNORMAL HIGH (ref 15–41)
Albumin: 2.9 g/dL — ABNORMAL LOW (ref 3.5–5.0)
Alkaline Phosphatase: 176 U/L — ABNORMAL HIGH (ref 38–126)
Anion gap: 13 (ref 5–15)
BUN: 17 mg/dL (ref 6–20)
CO2: 17 mmol/L — ABNORMAL LOW (ref 22–32)
Calcium: 8.2 mg/dL — ABNORMAL LOW (ref 8.9–10.3)
Chloride: 116 mmol/L — ABNORMAL HIGH (ref 98–111)
Creatinine, Ser: 1.27 mg/dL — ABNORMAL HIGH (ref 0.61–1.24)
GFR calc Af Amer: >60 mL/min (ref >60)
GFR calc non Af Amer: >60 mL/min (ref >60)
Glucose, Bld: 92 mg/dL (ref 70–99)
Potassium: 3.9 mmol/L (ref 3.5–5.1)
Sodium: 146 mmol/L — ABNORMAL HIGH (ref 135–145)
Total Bilirubin: 1.3 mg/dL — ABNORMAL HIGH (ref 0.3–1.2)
Total Protein: 5.9 g/dL — ABNORMAL LOW (ref 6.5–8.1)

## 2019-09-02 LAB — BASIC METABOLIC PANEL
Anion gap: 13 (ref 5–15)
BUN: 18 mg/dL (ref 6–20)
CO2: 17 mmol/L — ABNORMAL LOW (ref 22–32)
Calcium: 8.1 mg/dL — ABNORMAL LOW (ref 8.9–10.3)
Chloride: 116 mmol/L — ABNORMAL HIGH (ref 98–111)
Creatinine, Ser: 1.33 mg/dL — ABNORMAL HIGH (ref 0.61–1.24)
GFR calc Af Amer: >60 mL/min (ref >60)
GFR calc non Af Amer: 59 mL/min — ABNORMAL LOW (ref >60)
Glucose, Bld: 98 mg/dL (ref 70–99)
Potassium: 4 mmol/L (ref 3.5–5.1)
Sodium: 146 mmol/L — ABNORMAL HIGH (ref 135–145)

## 2019-09-02 LAB — PHOSPHORUS
Phosphorus: 2.6 mg/dL (ref 2.5–4.6)
Phosphorus: 2.7 mg/dL (ref 2.5–4.6)

## 2019-09-02 LAB — MAGNESIUM
Magnesium: 1.8 mg/dL (ref 1.7–2.4)
Magnesium: 1.8 mg/dL (ref 1.7–2.4)

## 2019-09-02 LAB — PROCALCITONIN: Procalcitonin: 0.16 ng/mL

## 2019-09-02 LAB — TROPONIN I (HIGH SENSITIVITY): Troponin I (High Sensitivity): 8 ng/L (ref <18)

## 2019-09-02 MED ORDER — HEPARIN SODIUM (PORCINE) 5000 UNIT/ML IJ SOLN: 5000.0000 [IU] | Freq: Three times a day (TID) | INTRAMUSCULAR | Status: DC

## 2019-09-02 MED ORDER — HYDROCORTISONE 1 % EX CREA
TOPICAL_CREAM | Freq: Two times a day (BID) | CUTANEOUS | Status: DC | PRN
  Administered 2019-09-08: 1 via TOPICAL
  Filled 2019-09-02 (×3): qty 28

## 2019-09-02 MED ORDER — LORAZEPAM 1 MG PO TABS: 1.0000 mg | ORAL_TABLET | ORAL | Status: DC | PRN

## 2019-09-02 MED ORDER — LORAZEPAM 2 MG/ML IJ SOLN
1.0000 mg | INTRAMUSCULAR | Status: DC | PRN
  Administered 2019-09-02 – 2019-09-03 (×3): 2 mg via INTRAVENOUS
  Filled 2019-09-02 (×4): qty 1

## 2019-09-02 MED ORDER — CARVEDILOL 3.125 MG PO TABS
3.1250 mg | ORAL_TABLET | Freq: Two times a day (BID) | ORAL | Status: DC
  Administered 2019-09-02 – 2019-09-05 (×3): 3.125 mg via ORAL
  Filled 2019-09-02 (×6): qty 1

## 2019-09-02 MED ORDER — ALBUTEROL SULFATE HFA 108 (90 BASE) MCG/ACT IN AERS
2.0000 | INHALATION_SPRAY | Freq: Four times a day (QID) | RESPIRATORY_TRACT | Status: DC | PRN
  Administered 2019-09-02 – 2019-09-08 (×6): 2 via RESPIRATORY_TRACT
  Filled 2019-09-02 (×2): qty 6.7

## 2019-09-02 NOTE — Progress Notes (Signed)
Report called to [facility]

## 2019-09-02 NOTE — Progress Notes (Signed)
Patient transferred to 45me26 via wheelchair with oxygen . Had his cellphone,Ipad with chargers and glasses .He tolerated the transfer well

## 2019-09-02 NOTE — Progress Notes (Signed)
Spoke to [facility] who will resume care tomorrow 1/19. Transfer orders for medial telemetry placed.  Lorenso Courier, MD Internal Medicine PGY3 09/02/2019, 10:05 AM

## 2019-09-02 NOTE — Progress Notes (Signed)
No bleeding active or pink d/c seen when pt void, will continue monitor,

## 2019-09-02 NOTE — Evaluation (Signed)
Occupational Therapy Evaluation Patient Details Name: Justin Bernard MRN: [id number] DOB: May 10, 1961 Today's Date: 09/02/2019    History of Present Illness 59 y/o with  PMH of NICM, HFrEF, essential tremors, gait disorder, vtach/vfib with AICD and ETOH abuse who fell at home, EMS called and did not feel a pulse, ROSC with 2 minutes CPR and then concern for stiffening and posturing.  Pt was hypoxic and confused in the ED and then intubated.Acute respiratory failure secondary to acute metabolic encephalopathy.  Possible alcohol withdrawal.  Possible seizures   Clinical Impression   This 59 yo male admitted with above presents to acute OT with dizziness with position changes, very varied BP during session (extremely high to low--see PT note), drop in sats on 2 liters of O2 with activity, decreased balance, decreased mobility all affecting his safety and independence with basic ADLs as he was independent pta but now is setup/S to min A +2 when up on his feet. He will benefit from acute OT with follow up [place] if he has 24 hour S/prn A otherwise may need ST SNF.    Follow Up Recommendations  Home health OT(as long as he has 24 hour S/prn A; otherwise ST SNF.)    Equipment Recommendations  None recommended by OT       Precautions / Restrictions Precautions Precautions: Fall Precaution Comments: watch BP and sats Restrictions Weight Bearing Restrictions: No      Mobility Bed Mobility Overal bed mobility: Needs Assistance Bed Mobility: Sit to Supine       Sit to supine: Min guard;HOB elevated   General bed mobility comments: Due to dizziness  Transfers Overall transfer level: Needs assistance Equipment used: 2 person hand held assist Transfers: Sit to/from [employer] Sit to Stand: Min assist;+2 physical assistance Stand pivot transfers: +2 physical assistance;Min assist            Balance Overall balance assessment: Needs assistance Sitting-balance  support: No upper extremity supported;Feet supported Sitting balance-Leahy Scale: Good     Standing balance support: Bilateral upper extremity supported Standing balance-Leahy Scale: Poor                             ADL either performed or assessed with clinical judgement   ADL Overall ADL's : Needs assistance/impaired Eating/Feeding: Independent;Sitting   Grooming: Set up;Sitting   Upper Body Bathing: Set up;Sitting   Lower Body Bathing: Minimal assistance;+2 for physical assistance;Sit to/from stand   Upper Body Dressing : Set up;Sitting   Lower Body Dressing: Minimal assistance;+2 for physical assistance;Sit to/from stand   Toilet Transfer: Minimal assistance;+2 for physical assistance;Stand-pivot Toilet Transfer Details (indicate cue type and reason): Bed to recliner Toileting- Clothing Manipulation and Hygiene: Moderate assistance Toileting - Clothing Manipulation Details (indicate cue type and reason): min A +2 sit<>stand             Vision Patient Visual Report: No change from baseline              Pertinent Vitals/Pain Pain Assessment: No/denies pain     Hand Dominance Right   Extremity/Trunk Assessment Upper Extremity Assessment Upper Extremity Assessment: Overall WFL for tasks assessed           Communication Communication Communication: No difficulties   Cognition Arousal/Alertness: Awake/alert Behavior During Therapy: WFL for tasks assessed/performed Overall Cognitive Status: Within Functional Limits for tasks assessed  Home Living Family/patient expects to be discharged to:: Private residence Living Arrangements: Alone Available Help at Discharge: Family;Available 24 hours/day;Available PRN/intermittently(says his sister can stay with him) Type of Home: House Home Access: Stairs to enter [employer] of Steps: 3 Entrance Stairs-Rails: None Home Layout: One  level     Bathroom Shower/Tub: Walk-in Pensions consultant: Standard     Home Equipment: Hand held shower head;Wheelchair - Economist - 4 wheels   Additional Comments: Pt reports he has to sit up to 2 hours at times when he first gets up or after a nap because he is too dizzy to stand up      Prior Functioning/Environment Level of Independence: Independent with assistive device(s)                 OT Problem List: Decreased strength;Decreased activity tolerance;Impaired balance (sitting and/or standing);Cardiopulmonary status limiting activity      OT Treatment/Interventions: Self-care/ADL training;DME and/or AE instruction;Patient/family education;Balance training    OT Goals(Current goals can be found in the care plan section) Acute Rehab OT Goals Patient Stated Goal: to not feel so dizzy when he get ups OT Goal Formulation: With patient Time For Goal Achievement: 09/16/19 Potential to Achieve Goals: Good  OT Frequency: Min 2X/week           Co-evaluation PT/OT/SLP Co-Evaluation/Treatment: Yes Reason for Co-Treatment: For patient/therapist safety;To address functional/ADL transfers PT goals addressed during session: Mobility/safety with mobility;Balance;Strengthening/ROM OT goals addressed during session: ADL's and self-care;Strengthening/ROM      AM-PAC OT "6 Clicks" Daily Activity     Outcome Measure Help from another person eating meals?: None Help from another person taking care of personal grooming?: A Little Help from another person toileting, which includes using toliet, bedpan, or urinal?: A Little Help from another person bathing (including washing, rinsing, drying)?: A Little Help from another person to put on and taking off regular upper body clothing?: A Little Help from another person to put on and taking off regular lower body clothing?: A Little 6 Click Score: 19   End of Session Equipment Utilized During Treatment:  Gait belt;Oxygen(2 liters) Nurse Communication: Mobility status(BP varied alot)  Activity Tolerance: (limited by dizziness (wide range of BP--see PT note)and decrease in sats) Patient left: in chair;with call bell/phone within reach;with chair alarm set  OT Visit Diagnosis: Unsteadiness on feet (R26.81);Other abnormalities of gait and mobility (R26.89);Muscle weakness (generalized) (M62.81);Dizziness and giddiness (R42)                Time: 3532-9924 OT Time Calculation (min): 44 min Charges:  OT General Charges $OT Visit: 1 Visit OT Evaluation $OT Eval Moderate Complexity: 1 Mod OT Treatments $Self Care/Home Management : 8-22 mins  CathyOTR/L Acute Rehab Services Pager [phone] Office [phone]     09/02/2019, 10:01 AM

## 2019-09-02 NOTE — Progress Notes (Signed)
SLP Cancellation Note  Patient Details Name: Justin Bernard MRN: [id number] DOB: 10-27-1960   Cancelled treatment:   Pt passed Yale swallow protocol, therefore formal SLP swallow eval is not warranted.  Our service will sign off.  Izreal Kock L. Samson Frederic, MA CCC/SLP Acute Rehabilitation Services Office number [phone] Pager [phone]         Blenda Mounts Laurice 09/02/2019, 12:13 PM

## 2019-09-02 NOTE — Progress Notes (Signed)
Pt has pink bloody  Around urethra after voiding, hold heparin at this time per bleeding, notify e link provider, states hold heparin when active bleeding, and ok to give heaprin when bleeding stop, will continue monitor.

## 2019-09-02 NOTE — Progress Notes (Addendum)
PULMONARY / CRITICAL CARE MEDICINE   NAME:  Justin Bernard, MRN:  [id number], DOB:  12/24/1960, LOS: 2 ADMISSION DATE:  08/30/2019, CONSULTATION DATE: 08/31/19 REFERRING MD: Dr. Criss Alvine , CHIEF COMPLAINT:  Syncope  BRIEF HISTORY:    59 y.o hfref, hx of vtach/vfib w/ aicd, etoh abuse who was admitted after falling in home. Found without pulse. CPR performed for 2 minutes prior to ROSC obtained. Intubated in ED. Being treated for acute encephalopathy.  SIGNIFICANT PAST MEDICAL HISTORY    AICD (automatic cardioverter/defibrillator) present, Arthritis, Asthma, Cataract, Chronic systolic heart failure (HCC), Diabetes mellitus without complication (HCC), Family history of adverse reaction to anesthesia, History of hiatal hernia, History of kidney stones, Hypertension, Hypokalemia, Neuromuscular disorder (HCC), Osteomyelitis (HCC), Ventricular fibrillation (HCC), and Ventricular tachycardia (HCC).  SIGNIFICANT EVENTS:  1/16 Admit to PCCM, empiric heparin for presumed pe, keppra load  pt agitated given precedex, dec propofol due to low bp 1/17 extubated  1/18 stable respirating on room air, titrated off precedex, transferred to medical telemetry  CULTURES:  1/16 blood culture no growth 1 day 1/15 urine culture  1/5 covid, flu-negative   ANTIBIOTICS:   Ancef 1/16>  LINES/TUBES:  Peripheral IV bilateral antecubital  Peripheral iv right hand  CONSULTANTS:  Neurology SUBJECTIVE:   Patient recalls what brought him into hospital. Stated he is doing well after being extubated this morning.   CONSTITUTIONAL: BP 121/73   Pulse 61   Temp 98.1 F (36.7 C)   Resp 17   Ht 6\' 4"  (1.93 m)   Wt 115.1 kg   SpO2 97%   BMI 30.89 kg/m   I/O last 3 completed shifts: In: 6496.7 [I.V.:4369.8; IV Piggyback:2126.9] Out: 1780 [Urine:1780]  Vent Mode: PRVC FiO2 (%):  [40 %] 40 % Set Rate:  [20 bmp] 20 bmp Vt Set:  mL] 690 mL PEEP:  [5 cmH20] 5 cmH20 Plateau Pressure:  [18 cmH20] 18  cmH20  PHYSICAL EXAM: General: resting comfortably Neuro: alert, oriented x3, able to follow commands HEENT: no conjunctival injection  Cardiovascular: rrr, s1 and s2 audible, no murmurs Lungs: CTAB, no rales, rhonchi Musculoskeletal:  No edema Skin: warm extremities   ASSESSMENT AND PLAN   Acute respiratory failure  Patient continues to well off of extubation done 1/17.   Lower extremity doppler does not show dvt on either side. D-dimer 1.57. Patient placed on prophylactic dose of heparin. Chest xray 1/16 without areas of consolidation or effusion.   Acute metabolic encephalopathy  Improved.  Likely secondary to alcohol withdrawal. UDS, etoh, ammonia levels normal. Procalcitonin 0.16. EEG showed moderate diffuse encephalopathy that is nonspecific.Patient afebrile, without leukocytosis (wbc 5.5).   Titrated off precedex will swith to prn ativan  -prn ativan -follow up blood culture 1/16 no growth 1 day  -follow up urine culture 1/16 no growth -avoid sedation  -Patient can transfer out of floor.  Concern for Right Shoulder Osteomyelitis  Right shoulder xray showing chronic changes without acute abnormality. Discontinued cefazolin 1/17.   Alcohol Use Disorder  Last use Friday 1/15. He states he usually drinks 1-2 ounces of bourbon, other hard liquour and beer. He was hospitalized for alcohol withdrawal previously in 2015.   Did not require prn versed, had one dose of ativan early 1/17 am.  -thiamine  -folic acid  -multivitamins -titrate off precedex -ativan q6hrs  -versed q4hrs prn  NICM, HTN  -continue amiodarone -continue coreg  Acute Kidney Injury Bmp pending   Hypokalemia  Resolved, will f/u morning bmp  Thrombocytopenia  Platelets 123 increased from 116, likely secondary to alcohol use.  HFrEF TTE shwoing 40-45% ef, ivc normal in size, left ventricle global hypokinesis. Last   Best Practice / Goals of Care / Disposition.   DVT PROPHYLAXIS: sq  heparin SUP: none NUTRITION: cardiac diet MOBILITY: pt, ot GOALS OF CARE:full FAMILY DISCUSSIONS: Pt stated he did not want me to call family DISPOSITION ICU  LABS  Glucose Recent Labs  Lab 08/31/19 2338 09/01/19 0336 09/01/19 0704 09/01/19 1106 09/01/19 1512 09/01/19 1916  GLUCAP 73 78 90 81 99 101*    BMET Recent Labs  Lab 09/01/19 0249 09/01/19 0910 09/01/19 1422  NA 142 143 143  K 2.9* 3.9 4.2  CL 112* 112* 114*  CO2 14* 17* 21*  BUN 22* 22* 21*  CREATININE 1.57* 1.58* 1.44*  GLUCOSE 81 97 100*    Liver Enzymes Recent Labs  Lab 08/30/19 2035 09/01/19 1422  AST 129* 89*  ALT 48* 31  ALKPHOS 183* 145*  BILITOT 2.0* 1.1  ALBUMIN 3.7 2.6*    Electrolytes Recent Labs  Lab 08/30/19 2035 08/31/19 0037 08/31/19 0917 08/31/19 1329 09/01/19 0249 09/01/19 0910 09/01/19 1422 09/02/19 0328  CALCIUM 8.6*   < > 7.4*   < > 7.9* 8.0* 7.7*  --   MG 2.0  --  1.9  --   --   --   --  1.8  PHOS  --   --  3.8  --   --   --   --  2.6   < > = values in this interval not displayed.    CBC Recent Labs  Lab 08/31/19 0037 08/31/19 0051 08/31/19 0829 09/01/19 0249 09/02/19 0328  WBC 9.6  --   --  7.9 5.5  HGB 13.8   < > 11.6* 10.4* 11.8*  HCT 46.8   < > 34.0* 34.4* 39.4  PLT 124*  --   --  116* 123*   < > = values in this interval not displayed.    ABG Recent Labs  Lab 08/31/19 0051 08/31/19 0329 08/31/19 0829  PHART 7.351 7.283* 7.351  PCO2ART 36.7 39.4 38.2  PO2ART 121.0* 68.0* 71.0*    Coag's No results for input(s): APTT, INR in the last 168 hours.  Sepsis Markers Recent Labs  Lab 08/31/19 0037 08/31/19 0238 08/31/19 0510 09/01/19 0249 09/02/19 0328  LATICACIDVEN  --  1.3 1.1  --   --   PROCALCITON 0.32  --   --  0.12 0.16    Cardiac Enzymes No results for input(s): TROPONINI, PROBNP in the last 168 hours.  REVIEW OF SYSTEMS:     Patient doing well. Was conversive.   Lars Mage, MD Internal Medicine PGY3

## 2019-09-02 NOTE — Evaluation (Signed)
Physical Therapy Evaluation Patient Details Name: Justin Bernard MRN: [id number] DOB: Sep 03, 1960 Today's Date: 09/02/2019   History of Present Illness  59 y/o with  PMH of NICM, HFrEF, essential tremors, gait disorder, vtach/vfib with AICD and ETOH abuse who fell at home, EMS called and did not feel a pulse, ROSC with 2 minutes CPR and then concern for stiffening and posturing.  Pt was hypoxic and confused in the ED and then intubated.Acute respiratory failure secondary to acute metabolic encephalopathy.  Possible alcohol withdrawal.  Possible seizures    Clinical Impression  Pt admitted with above. Pt c/o dizziness with all movement. Reports 12/10 on dizziness scale upon sitting up. BP taken several times and was inconsistent between high and low. See BPs below. Pt reports "sometimes I sit on the EOB at home for 2 hours because I don't feel safe getting up." Pt functioning at minA level but haven't attempted ambulation due to significant dizziness. Pt lives alone however states his sister can come stay with him. Due to his dizziness and history of falling pt requires 24/7 supervision for safe d/c home. Aware pt on CIWA, pt shaky during session. Acute PT to cont to follow.  All taken in L UE BP 120/69 (upon entering room) BP 145/122 (upon long sit) - pt c/o 12/10 dizziness BP 82/58 upon sitting up EOB BP 96/73 s/p 3 min sitting EOB BP 69/58 once transferred to chair BP 91/49 s/p 5 min in chair     Follow Up Recommendations Home health PT;Supervision/Assistance - 24 hour    Equipment Recommendations  None recommended by PT(has all DME)    Recommendations for Other Services       Precautions / Restrictions Precautions Precautions: Fall Precaution Comments: watch BP and sats Restrictions Weight Bearing Restrictions: No      Mobility  Bed Mobility Overal bed mobility: Needs Assistance Bed Mobility: Sit to Supine       Sit to supine: Min guard;HOB elevated   General  bed mobility comments: Due to dizziness  Transfers Overall transfer level: Needs assistance Equipment used: 2 person hand held assist Transfers: Sit to/from [employer] Sit to Stand: Min assist;+2 physical assistance Stand pivot transfers: +2 physical assistance;Min assist       General transfer comment: minA to steady as pt was 12/10 on scale of dizziness  Ambulation/Gait             General Gait Details: limited to std pvt to chair as pt with 12/10 dizziness  Stairs            Wheelchair Mobility    Modified Rankin (Stroke Patients Only)       Balance Overall balance assessment: Needs assistance Sitting-balance support: No upper extremity supported;Feet supported Sitting balance-Leahy Scale: Good Sitting balance - Comments: pt did have c/o of 12/10 dizziness   Standing balance support: Bilateral upper extremity supported Standing balance-Leahy Scale: Poor Standing balance comment: limited by dizziness                             Pertinent Vitals/Pain Pain Assessment: No/denies pain    Home Living Family/patient expects to be discharged to:: Private residence Living Arrangements: Alone Available Help at Discharge: Family;Available 24 hours/day;Available PRN/intermittently(says his sister can stay with him) Type of Home: House Home Access: Stairs to enter Entrance Stairs-Rails: None Entrance Stairs-Number of Steps: 3 Home Layout: One level Home Equipment: Hand held shower head;Wheelchair - Economist -  4 wheels Additional Comments: Pt reports he has to sit up to 2 hours at times when he first gets up or after a nap because he is too dizzy to stand up    Prior Function Level of Independence: Independent with assistive device(s)         Comments: reports using cane and rollator recently but doesn't use typically     Hand Dominance   Dominant Hand: Right    Extremity/Trunk Assessment   Upper Extremity  Assessment Upper Extremity Assessment: Overall WFL for tasks assessed    Lower Extremity Assessment Lower Extremity Assessment: Overall WFL for tasks assessed    Cervical / Trunk Assessment Cervical / Trunk Assessment: Normal  Communication   Communication: No difficulties  Cognition Arousal/Alertness: Awake/alert Behavior During Therapy: WFL for tasks assessed/performed Overall Cognitive Status: Within Functional Limits for tasks assessed                                        General Comments General comments (skin integrity, edema, etc.): pt with scabbed over lips    Exercises     Assessment/Plan    PT Assessment Patient needs continued PT services  PT Problem List Decreased strength;Decreased range of motion;Decreased activity tolerance;Decreased balance;Decreased mobility;Decreased coordination;Decreased cognition;Decreased knowledge of use of DME       PT Treatment Interventions DME instruction;Gait training;Stair training;Functional mobility training;Therapeutic activities;Balance training;Therapeutic exercise    PT Goals (Current goals can be found in the Care Plan section)  Acute Rehab PT Goals Patient Stated Goal: stop the dizziness PT Goal Formulation: With patient Time For Goal Achievement: 09/16/19 Potential to Achieve Goals: Good    Frequency Min 3X/week   Barriers to discharge Decreased caregiver support(lives alone, however states his sister can stay with him)      Co-evaluation PT/OT/SLP Co-Evaluation/Treatment: Yes Reason for Co-Treatment: For patient/therapist safety PT goals addressed during session: Mobility/safety with mobility OT goals addressed during session: ADL's and self-care;Strengthening/ROM       AM-PAC PT "6 Clicks" Mobility  Outcome Measure Help needed turning from your back to your side while in a flat bed without using bedrails?: None Help needed moving from lying on your back to sitting on the side of a flat  bed without using bedrails?: None Help needed moving to and from a bed to a chair (including a wheelchair)?: A Little Help needed standing up from a chair using your arms (e.g., wheelchair or bedside chair)?: A Little Help needed to walk in hospital room?: A Lot Help needed climbing 3-5 steps with a railing? : A Lot 6 Click Score: 18    End of Session Equipment Utilized During Treatment: Gait belt Activity Tolerance: Treatment limited secondary to medical complications (Comment)(limited by dizziness) Patient left: in chair;with chair alarm set;with call bell/phone within reach Nurse Communication: Mobility status(and BP) PT Visit Diagnosis: Unsteadiness on feet (R26.81);History of falling (Z91.81);Difficulty in walking, not elsewhere classified (R26.2)    Time: 3557-3220 PT Time Calculation (min) (ACUTE ONLY): 43 min   Charges:   PT Evaluation $PT Eval Moderate Complexity: 1 Mod          Kittie Plater, PT, DPT Acute Rehabilitation Services Pager #: [phone] Office #: [phone]   Berline Lopes 09/02/2019, 10:17 AM

## 2019-09-03 ENCOUNTER — Inpatient Hospital Stay (HOSPITAL_COMMUNITY): Payer: Self-pay

## 2019-09-03 DIAGNOSIS — N179 Acute kidney failure, unspecified: Secondary | ICD-10-CM

## 2019-09-03 DIAGNOSIS — J9602 Acute respiratory failure with hypercapnia: Secondary | ICD-10-CM

## 2019-09-03 DIAGNOSIS — I509 Heart failure, unspecified: Secondary | ICD-10-CM

## 2019-09-03 LAB — BASIC METABOLIC PANEL
Anion gap: 8 (ref 5–15)
Anion gap: 9 (ref 5–15)
BUN: 12 mg/dL (ref 6–20)
BUN: 11 mg/dL (ref 6–20)
CO2: 21 mmol/L — ABNORMAL LOW (ref 22–32)
CO2: 22 mmol/L (ref 22–32)
Calcium: 8.1 mg/dL — ABNORMAL LOW (ref 8.9–10.3)
Calcium: 8.7 mg/dL — ABNORMAL LOW (ref 8.9–10.3)
Chloride: 114 mmol/L — ABNORMAL HIGH (ref 98–111)
Chloride: 111 mmol/L (ref 98–111)
Creatinine, Ser: 1.02 mg/dL (ref 0.61–1.24)
Creatinine, Ser: 1 mg/dL (ref 0.61–1.24)
GFR calc Af Amer: >60 mL/min (ref >60)
GFR calc Af Amer: >60 mL/min (ref >60)
GFR calc non Af Amer: >60 mL/min (ref >60)
GFR calc non Af Amer: >60 mL/min (ref >60)
Glucose, Bld: 137 mg/dL — ABNORMAL HIGH (ref 70–99)
Glucose, Bld: 130 mg/dL — ABNORMAL HIGH (ref 70–99)
Potassium: 3.8 mmol/L (ref 3.5–5.1)
Potassium: 3.9 mmol/L (ref 3.5–5.1)
Sodium: 143 mmol/L (ref 135–145)
Sodium: 142 mmol/L (ref 135–145)

## 2019-09-03 LAB — BLOOD GAS, ARTERIAL
Acid-base deficit: 6.5 mmol/L — ABNORMAL HIGH (ref 0.0–2.0)
Bicarbonate: 22.5 mmol/L (ref 20.0–28.0)
Drawn by: 21179
FIO2: 75
O2 Saturation: 96.9 %
Patient temperature: 37
pCO2 arterial: 82.7 mmHg (ref 32.0–48.0)
pH, Arterial: 7.063 — CL (ref 7.350–7.450)
pO2, Arterial: 116 mmHg — ABNORMAL HIGH (ref 83.0–108.0)

## 2019-09-03 LAB — CBC
HCT: 40 % (ref 39.0–52.0)
HCT: 47.9 % (ref 39.0–52.0)
Hemoglobin: 11.7 g/dL — ABNORMAL LOW (ref 13.0–17.0)
Hemoglobin: 13.8 g/dL (ref 13.0–17.0)
MCH: 31.4 pg (ref 26.0–34.0)
MCH: 31.4 pg (ref 26.0–34.0)
MCHC: 29.3 g/dL — ABNORMAL LOW (ref 30.0–36.0)
MCHC: 28.8 g/dL — ABNORMAL LOW (ref 30.0–36.0)
MCV: 107.2 fL — ABNORMAL HIGH (ref 80.0–100.0)
MCV: 109.1 fL — ABNORMAL HIGH (ref 80.0–100.0)
Platelets: 193 10*3/uL (ref 150–400)
Platelets: 159 10*3/uL (ref 150–400)
RBC: 3.73 MIL/uL — ABNORMAL LOW (ref 4.22–5.81)
RBC: 4.39 MIL/uL (ref 4.22–5.81)
RDW: 17.9 % — ABNORMAL HIGH (ref 11.5–15.5)
RDW: 17.7 % — ABNORMAL HIGH (ref 11.5–15.5)
WBC: 6.7 10*3/uL (ref 4.0–10.5)
WBC: 6.5 10*3/uL (ref 4.0–10.5)
nRBC: 0 % (ref 0.0–0.2)
nRBC: 0 % (ref 0.0–0.2)

## 2019-09-03 LAB — POCT I-STAT 7, (LYTES, BLD GAS, ICA,H+H)
Acid-base deficit: 4 mmol/L — ABNORMAL HIGH (ref 0.0–2.0)
Bicarbonate: 24.7 mmol/L (ref 20.0–28.0)
Calcium, Ion: 1.27 mmol/L (ref 1.15–1.40)
HCT: 41 % (ref 39.0–52.0)
Hemoglobin: 13.9 g/dL (ref 13.0–17.0)
O2 Saturation: 98 %
Potassium: 3.2 mmol/L — ABNORMAL LOW (ref 3.5–5.1)
Sodium: 144 mmol/L (ref 135–145)
TCO2: 26 mmol/L (ref 22–32)
pCO2 arterial: 56.9 mmHg — ABNORMAL HIGH (ref 32.0–48.0)
pH, Arterial: 7.245 — ABNORMAL LOW (ref 7.350–7.450)
pO2, Arterial: 118 mmHg — ABNORMAL HIGH (ref 83.0–108.0)

## 2019-09-03 LAB — PHOSPHORUS: Phosphorus: 3.3 mg/dL (ref 2.5–4.6)

## 2019-09-03 LAB — GLUCOSE, CAPILLARY
Glucose-Capillary: 127 mg/dL — ABNORMAL HIGH (ref 70–99)
Glucose-Capillary: 115 mg/dL — ABNORMAL HIGH (ref 70–99)

## 2019-09-03 LAB — MAGNESIUM: Magnesium: 1.6 mg/dL — ABNORMAL LOW (ref 1.7–2.4)

## 2019-09-03 LAB — BRAIN NATRIURETIC PEPTIDE: B Natriuretic Peptide: 667.8 pg/mL — ABNORMAL HIGH (ref 0.0–100.0)

## 2019-09-03 MED ORDER — LORAZEPAM 2 MG/ML IJ SOLN
1.0000 mg | INTRAMUSCULAR | Status: DC | PRN
  Administered 2019-09-03 (×3): 2 mg via INTRAVENOUS
  Filled 2019-09-03 (×3): qty 1

## 2019-09-03 MED ORDER — LORAZEPAM 1 MG PO TABS
1.0000 mg | ORAL_TABLET | ORAL | Status: DC | PRN
  Administered 2019-09-04 – 2019-09-05 (×2): 1 mg via ORAL
  Administered 2019-09-05 (×2): 2 mg via ORAL
  Filled 2019-09-03 (×2): qty 1
  Filled 2019-09-03 (×2): qty 2

## 2019-09-03 MED ORDER — FUROSEMIDE 10 MG/ML IJ SOLN
80.0000 mg | Freq: Once | INTRAMUSCULAR | Status: AC
  Administered 2019-09-03: 18:00:00 80 mg via INTRAVENOUS

## 2019-09-03 MED ORDER — FUROSEMIDE 10 MG/ML IJ SOLN
INTRAMUSCULAR | Status: AC
  Filled 2019-09-03: qty 8

## 2019-09-03 NOTE — Progress Notes (Signed)
PT Cancellation Note  Patient Details Name: Ferdinando Lodge MRN: [id number] DOB: 01/03/1961   Cancelled Treatment:      Pt supine in bed upon PT arrival, pt declining OOB activity this session. Pt reports that he would just like to sleep right now. Declines bed level therapies as well. Will follow up as time/schedule allows.    Mindi Curling Schagen 09/03/2019, 10:59 AM

## 2019-09-03 NOTE — Progress Notes (Addendum)
Transfer Summary Justin Bernard is a 59 y.o male with vtach/vfib with aicd, hx of etoh use admitted on 08/30/19 after falling out of his bed and found pulseless. CPR performed for 2 minutes and ROSC was obtained. He was brought into the ED and intubated. He was able to be successfully extubated after 1 day 1/16-1/17. He was stabilized and transferred to [facility] hospital[facility] service on 2-3L via [place].   Overnight subsequent to transfer to floor unit the patient was reported to have difficulty breathing and was placed on a venturi mask 11L which continued to get worse throughout the day. Rapid response was called approximately 1600 1/19 due to patient being obtunded, using accessory muscles, and being somnolent. Dr Gwenlyn Perking with [facility] obtained ABG, placed patient on bipap, and called for icu transfer.   Went to bedside to evaluate patient. He was laying in bed with bipap on, appears somnolent, able to respond to name, using accessory muscles to breath, 1+ pitting edema in lower extremities.   Assessment and plan  Patient continues to be afebrile. ABG showing acute metabolic acidosis ph 7.063, co2 82, o2 116, bicarb 6.5. Chest xray with new right lower lobe opacity that is concerning for aspiration vs effusion. However, due to patient not being febrile or having leukocytosis infectious process is less likely.   Will treat as pulmonary edema likely due to inadequate diuresis. Last TTE done 1/16 showed ef 40-45%, left ventricle with global hypokinesis, IVC with greater than 50% respiratory variability, no mitral stenosis, no aortic valve sclerosis or stenosis.   -IV lasix 80mg  -Monitor I/O  -Daily Weights -transfer to ICU  -stat bmp to check for gap -stat cbc -bnp -repeat abg after 30 min -continue carvedilol 3.125mg  bid   , MD Internal Medicine PGY3 09/03/2019, 5:00 PM

## 2019-09-03 NOTE — Significant Event (Signed)
Rapid Response Event Note  Overview: Time Called: 1520 Arrival Time: 1520 Event Type: Respiratory Rounding on pt. Pt found to have decreased LOC and increased work of breathing.   Initial Focused Assessment: Pt lying in bed. Pt opens eyes to persistent verbal & physical stimuli, but does not sustain eye opening. Pt will not answer orientation, but will say "stop it" to me when trying to arouse him. Pt moves all extremities. PERRLA. Pt is warm, diaphoretic to touch. Lung sounds are clear. Accessory muscle use noted in pt's upper chest and upper abdomen. Initially pt was on 12L/50% Veni mask, ultimately required 15L NRB to maintain oxygen saturations >92%. Pt received 2mg  IV ativan at 1330.   Interventions: ABG 7.06/82.7/116/22.5 Dr. 06-23-1995 updated and is consulting PCCM. BiPAP 100%  12/6  RT at bedside to place pt on BiPAP. Once on BiPAP, pt RR now 19, pulling tidal volumes of 400.  Plan of Care (if not transferred): Pt transferred with RRRN to 95M Called away to another rapid response call at 1637. Spoke with the charge RN on 95M at 1634 who was preparing a bed for this pt.   Event Summary: Name of Physician Notified: Dr. Gwenlyn Perking at 1545    at    Outcome: Transferred (Comment)(Transferred to 95M)  Event End Time: 1635  1636

## 2019-09-03 NOTE — Progress Notes (Deleted)
Patient presented with a COVID positive result Dr Vassie Loll notified and Pt does NOT need to be on isolation at this time see MD note. Patient was first tested  COVID positive 08/07/19. Pt admitted for GI bleed today

## 2019-09-03 NOTE — Progress Notes (Signed)
Contacted by rapid response nurse, patient is more obtunded, using accessory muscles mildly and very somnolent. ABG demonstrating PH 7.063, CO2 82.7, o2 116 and bicarb 6.5. BIPAP orders in place and given severe acidosis with acute hypercapnic resp failure will transfer to ICU. PCCM has been contacted and will follow patient along for further assistance and recommendations.  Vassie Loll MD [phone]

## 2019-09-03 NOTE — Progress Notes (Signed)
Patient transported to 2M13 from 3E26 without any apparent complications.

## 2019-09-03 NOTE — Plan of Care (Signed)

## 2019-09-03 NOTE — Progress Notes (Signed)
PROGRESS NOTE    Justin Bernard  GXQ:[id number] DOB: Jul 29, 1961 DOA: 08/30/2019 PCP: Jarrett Soho, PA-C     Brief Narrative:   59 y.o. M with PMH of NICM, HFrEF, vtach/vfib with AICD and ETOH abuse who presents with a fall out of a chair at home, per report fall from standing.  When EMS arrived, there was concern for loss of pulses and about 2 minutes of CPR performed with ROSC and then stiffening and posturing concerning for seizure.  Pt in sinus rhythm on arrival to ED and awake though confused.  Became increasingly agitated despite Ativan.  He was hypoxic into 70%'s requiring facemask oxygen.  Head CT was negative and pt was intubated for hypoxia and agitation.    Work-up in the ED was significant for significant hypokalemia of 2.5, creatinine 2.03, mildly elevated transaminases with bilirubin of 2.0, troponin 38 and ETOH level <10.  Empiric heparin ordered in the setting of syncope and hypoxia for empiric treatment of PE.   [employer] pacemaker was interrogated and no episodes of arrhythmia found.  Pt was also evaluated by neurology and loaded with Keppra.    Assessment & Plan: 1-Alcohol withdrawal syndrome, with delirium (HCC) -Extubated on 09/01/2019 -At this moment is still with intermittent episode of high C1 -Continue as needed Ativan -Continue thiamine and folic acid -Close monitoring to clinical response.  2-Syncope -Per AICD interrogation no arrhythmia isolated -Most likely associated with alcohol abuse -Continue monitoring on telemetry. -Continue to follow replete electrolytes.  3-acute metabolic encephalopathy -Oriented x3 even he was minimally interactive. -Flat affect appreciated -Continue to follow mental status.  4-heart failure with reduced ejection fraction -Continue to follow daily weights and strict I's and O's -Low-sodium diet has been encouraged -Continue beta-blocker  5-history of V. tach/V. fib -Continue beta-blockers and  Passarella -Continue monitoring on telemetry -Close monitoring of electrolytes with further repletion as needed.  6-acute kidney injury -improved with IVF's -will continue to follow electrolytes and renal function.   DVT prophylaxis: Heparin Code Status: Full code Family Communication: No family at bedside. Disposition Plan: Continue as needed Ativan, follow clinical response and increase physical activity.  Overnight still with increase CIWA score requiring Ativan management.  PT evaluation pending.  Consultants:   None  Procedures:   See below for x-ray reports  Antimicrobials:  Anti-infectives (From admission, onward)   Start     Dose/Rate Route Frequency Ordered Stop   08/31/19 0030  ceFAZolin (ANCEF) IVPB 2g/100 mL premix  Status:  Discontinued     2 g 200 mL/hr over 30 Minutes Intravenous Every 8 hours 08/31/19 0028 09/01/19 1417   08/31/19 0015  vancomycin (VANCOREADY) IVPB 2000 mg/400 mL     2,000 mg 200 mL/hr over 120 Minutes Intravenous  Once 08/31/19 0003 08/31/19 0311   08/31/19 0015  ceFEPIme (MAXIPIME) 2 g in sodium chloride 0.9 % 100 mL IVPB  Status:  Discontinued     2 g 200 mL/hr over 30 Minutes Intravenous  Once 08/31/19 0003 08/31/19 0028       Subjective: No fever, no chest pain, no nausea vomiting.  Flat affect and decreased interaction appreciated during examination.  Objective: Vitals:   09/03/19 0743 09/03/19 1255 09/03/19 1536 09/03/19 1548  BP: 109/64 120/75 109/74   Pulse: (!) 109 (!) 109 100   Resp: 19 20  (!) 28  Temp: 97.6 F (36.4 C) 98.6 F (37 C)    TempSrc:      SpO2: 94% (!) 83% 91% 99%  Weight:      Height:        Intake/Output Summary (Last 24 hours) at 09/03/2019 1557 Last data filed at 09/03/2019 1500 Gross per 24 hour  Intake 720 ml  Output 725 ml  Net -5 ml   Filed Weights   09/01/19 0347 09/02/19 0500 09/03/19 0432  Weight: 118.1 kg 115.1 kg 115.7 kg    Examination: General exam: Alert, awake, oriented x 3;  flat affect and decreased interaction.  No nausea, no vomiting, no chest pain.  Currently afebrile and denying hallucinations. Respiratory system: Positive rhonchi bilaterally, no using accessory muscles. Cardiovascular system: RRR. No murmurs, rubs, gallops. Gastrointestinal system: Abdomen is obese, nondistended, soft and nontender. No organomegaly or masses felt. Normal bowel sounds heard. Central nervous system: Alert and oriented. No focal neurological deficits. Extremities: No cyanosis or clubbing Skin: No rashes, no petechiae Psychiatry: Flat affect, decreased interaction.    Data Reviewed: I have personally reviewed following labs and imaging studies  CBC: Recent Labs  Lab 08/31/19 0037 08/31/19 0051 08/31/19 0829 09/01/19 0249 09/02/19 0328 09/02/19 0907 09/03/19 0542  WBC 9.6  --   --  7.9 5.5 6.3 6.7  NEUTROABS 8.0*  --   --   --   --   --   --   HGB 13.8   < > 11.6* 10.4* 11.8* 12.3* 11.7*  HCT 46.8   < > 34.0* 34.4* 39.4 41.7 40.0  MCV 105.2*  --   --  103.9* 107.1* 106.6* 107.2*  PLT 124*  --   --  116* 123* 141* 193   < > = values in this interval not displayed.   Basic Metabolic Panel: Recent Labs  Lab 08/30/19 2035 08/30/19 2036 08/31/19 0917 08/31/19 1329 09/01/19 0910 09/01/19 1422 09/02/19 0328 09/02/19 0708 09/02/19 0907 09/03/19 0542  NA 140   < > 142   < > 143 143  --  146* 146* 143  K 2.8*   < > 2.7*   < > 3.9 4.2  --  4.0 3.9 3.8  CL 97*   < > 107   < > 112* 114*  --  116* 116* 114*  CO2 22   < > 18*   < > 17* 21*  --  17* 17* 21*  GLUCOSE 94   < > 87   < > 97 100*  --  98 92 137*  BUN 28*   < > 26*   < > 22* 21*  --  18 17 12   CREATININE 2.03*   < > 1.71*   < > 1.58* 1.44*  --  1.33* 1.27* 1.02  CALCIUM 8.6*   < > 7.4*   < > 8.0* 7.7*  --  8.1* 8.2* 8.1*  MG 2.0  --  1.9  --   --   --  1.8  --  1.8 1.6*  PHOS  --   --  3.8  --   --   --  2.6  --  2.7 3.3   < > = values in this interval not displayed.   GFR: Estimated Creatinine  Clearance: 111.2 mL/min (by C-G formula based on SCr of 1.02 mg/dL).   Liver Function Tests: Recent Labs  Lab 08/30/19 2035 09/01/19 1422 09/02/19 0907  AST 129* 89* 90*  ALT 48* 31 30  ALKPHOS 183* 145* 176*  BILITOT 2.0* 1.1 1.3*  PROT 7.0 5.2* 5.9*  ALBUMIN 3.7 2.6* 2.9*   Recent Labs  Lab 08/31/19 0510  LIPASE 60*   Recent Labs  Lab 08/31/19 0510  AMMONIA 68*   Cardiac Enzymes: Recent Labs  Lab 08/31/19 0917  CKTOTAL 215   CBG: Recent Labs  Lab 09/01/19 0704 09/01/19 1106 09/01/19 1512 09/01/19 1916 09/02/19 2121  GLUCAP 90 81 99 101* 92   Urine analysis:    Component Value Date/Time   COLORURINE YELLOW 08/31/2019 0148   APPEARANCEUR CLEAR 08/31/2019 0148   LABSPEC 1.013 08/31/2019 0148   PHURINE 7.0 08/31/2019 0148   GLUCOSEU NEGATIVE 08/31/2019 0148   HGBUR NEGATIVE 08/31/2019 0148   BILIRUBINUR NEGATIVE 08/31/2019 0148   KETONESUR 20 (A) 08/31/2019 0148   PROTEINUR 30 (A) 08/31/2019 0148   NITRITE NEGATIVE 08/31/2019 0148   LEUKOCYTESUR NEGATIVE 08/31/2019 0148    Recent Results (from the past 240 hour(s))  Respiratory Panel by RT PCR (Flu A&B, Covid) - Nasopharyngeal Swab     Status: None   Collection Time: 08/30/19  9:10 PM   Specimen: Nasopharyngeal Swab  Result Value Ref Range Status   SARS Coronavirus 2 by RT PCR NEGATIVE NEGATIVE Final    Comment: (NOTE) SARS-CoV-2 target nucleic acids are NOT DETECTED. The SARS-CoV-2 RNA is generally detectable in upper respiratoy specimens during the acute phase of infection. The lowest concentration of SARS-CoV-2 viral copies this assay can detect is 131 copies/mL. A negative result does not preclude SARS-Cov-2 infection and should not be used as the sole basis for treatment or other patient management decisions. A negative result may occur with  improper specimen collection/handling, submission of specimen other than nasopharyngeal swab, presence of viral mutation(s) within the areas targeted  by this assay, and inadequate number of viral copies (<131 copies/mL). A negative result must be combined with clinical observations, patient history, and epidemiological information. The expected result is Negative. Fact Sheet for Patients:  https://www.moore.com/ Fact Sheet for Healthcare Providers:  https://www.young.biz/ This test is not yet ap proved or cleared by the [place] FDA and  has been authorized for detection and/or diagnosis of SARS-CoV-2 by FDA under an Emergency Use Authorization (EUA). This EUA will remain  in effect (meaning this test can be used) for the duration of the COVID-19 declaration under Section 564(b)(1) of the Act, 21 U.S.C. section 360bbb-3(b)(1), unless the authorization is terminated or revoked sooner.    Influenza A by PCR NEGATIVE NEGATIVE Final   Influenza B by PCR NEGATIVE NEGATIVE Final    Comment: (NOTE) The Xpert Xpress SARS-CoV-2/FLU/RSV assay is intended as an aid in  the diagnosis of influenza from Nasopharyngeal swab specimens and  should not be used as a sole basis for treatment. Nasal washings and  aspirates are unacceptable for Xpert Xpress SARS-CoV-2/FLU/RSV  testing. Fact Sheet for Patients: https://www.moore.com/ Fact Sheet for Healthcare Providers: https://www.young.biz/ This test is not yet approved or cleared by the [place] FDA and  has been authorized for detection and/or diagnosis of SARS-CoV-2 by  FDA under an Emergency Use Authorization (EUA). This EUA will remain  in effect (meaning this test can be used) for the duration of the  Covid-19 declaration under Section 564(b)(1) of the Act, 21  U.S.C. section 360bbb-3(b)(1), unless the authorization is  terminated or revoked. Performed at [facility], [address], [place], [place] [address]   Urine culture     Status: None   Collection Time: 08/31/19  1:48 AM   Specimen: Urine,  Random  Result Value Ref Range Status   Specimen Description URINE, RANDOM  Final   Special Requests  NONE  Final   Culture   Final    NO GROWTH Performed at [facility], [address], [place], [place] [address]    Report Status 09/01/2019 FINAL  Final  Culture, blood (routine x 2)     Status: None (Preliminary result)   Collection Time: 08/31/19  2:18 AM   Specimen: BLOOD  Result Value Ref Range Status   Specimen Description BLOOD LEFT HAND  Final   Special Requests   Final    BOTTLES DRAWN AEROBIC ONLY Blood Culture results may not be optimal due to an inadequate volume of blood received in culture bottles   Culture   Final    NO GROWTH 3 DAYS Performed at [facility], [address] [place], [place] [address]    Report Status PENDING  Incomplete  Culture, blood (routine x 2)     Status: None (Preliminary result)   Collection Time: 08/31/19  2:38 AM   Specimen: BLOOD  Result Value Ref Range Status   Specimen Description BLOOD LEFT HAND  Final   Special Requests   Final    BOTTLES DRAWN AEROBIC ONLY Blood Culture results may not be optimal due to an inadequate volume of blood received in culture bottles   Culture   Final    NO GROWTH 3 DAYS Performed at [facility], [address] [place], [place] [address]    Report Status PENDING  Incomplete     Radiology Studies: No results found.   Scheduled Meds: . amiodarone  200 mg Oral Daily  . carvedilol  3.125 mg Oral BID WC  . Chlorhexidine Gluconate Cloth  6 each Topical Daily  . folic acid  1 mg Per Tube Daily  . heparin injection (subcutaneous)  5,000 Units Subcutaneous Q8H  . mouth rinse  15 mL Mouth Rinse BID  . multivitamin with minerals  1 tablet Per Tube Daily  . thiamine  100 mg Oral Daily   Continuous Infusions:   LOS: 3 days    Time spent: 30 minutes.    Vassie Loll, MD [facility] Pager [phone]  09/03/2019, 3:57 PM

## 2019-09-03 NOTE — Progress Notes (Signed)
Patient became lethargic will open his eye for few seconds to response to voice and go back to sleep, oxygen saturation down to 86%/11 L venturi mask patient have been taking the mask off and on  rapid came rounding on the patient, MD and respiratory called, ABG done see epic for MD new order and ABG result. Patient placed on Bipap and awaiting to be transfer to 86M per order

## 2019-09-03 NOTE — Progress Notes (Signed)
o2 sat 87-90% when pt sleeping and on 3 l n/c , pt breathing  Through mouth, unable to elevate o2sat, lungs sound clear, notify RT, place o2 mask on with 40 %, o2 sat back up to 93%, rest in bed, no sob, will continue monitor

## 2019-09-04 DIAGNOSIS — J810 Acute pulmonary edema: Secondary | ICD-10-CM

## 2019-09-04 DIAGNOSIS — I5040 Unspecified combined systolic (congestive) and diastolic (congestive) heart failure: Secondary | ICD-10-CM

## 2019-09-04 DIAGNOSIS — I472 Ventricular tachycardia: Secondary | ICD-10-CM

## 2019-09-04 LAB — CBC
HCT: 36.7 % — ABNORMAL LOW (ref 39.0–52.0)
Hemoglobin: 11 g/dL — ABNORMAL LOW (ref 13.0–17.0)
MCH: 31.7 pg (ref 26.0–34.0)
MCHC: 30 g/dL (ref 30.0–36.0)
MCV: 105.8 fL — ABNORMAL HIGH (ref 80.0–100.0)
Platelets: 129 10*3/uL — ABNORMAL LOW (ref 150–400)
RBC: 3.47 MIL/uL — ABNORMAL LOW (ref 4.22–5.81)
RDW: 17.2 % — ABNORMAL HIGH (ref 11.5–15.5)
WBC: 3.7 10*3/uL — ABNORMAL LOW (ref 4.0–10.5)
nRBC: 0 % (ref 0.0–0.2)

## 2019-09-04 LAB — BASIC METABOLIC PANEL
Anion gap: 9 (ref 5–15)
Anion gap: 10 (ref 5–15)
BUN: 11 mg/dL (ref 6–20)
BUN: 14 mg/dL (ref 6–20)
CO2: 26 mmol/L (ref 22–32)
CO2: 26 mmol/L (ref 22–32)
Calcium: 8.1 mg/dL — ABNORMAL LOW (ref 8.9–10.3)
Calcium: 8.7 mg/dL — ABNORMAL LOW (ref 8.9–10.3)
Chloride: 109 mmol/L (ref 98–111)
Chloride: 107 mmol/L (ref 98–111)
Creatinine, Ser: 0.99 mg/dL (ref 0.61–1.24)
Creatinine, Ser: 1.06 mg/dL (ref 0.61–1.24)
GFR calc Af Amer: >60 mL/min (ref >60)
GFR calc Af Amer: >60 mL/min (ref >60)
GFR calc non Af Amer: >60 mL/min (ref >60)
GFR calc non Af Amer: >60 mL/min (ref >60)
Glucose, Bld: 139 mg/dL — ABNORMAL HIGH (ref 70–99)
Glucose, Bld: 113 mg/dL — ABNORMAL HIGH (ref 70–99)
Potassium: 3.2 mmol/L — ABNORMAL LOW (ref 3.5–5.1)
Potassium: 3.5 mmol/L (ref 3.5–5.1)
Sodium: 144 mmol/L (ref 135–145)
Sodium: 143 mmol/L (ref 135–145)

## 2019-09-04 LAB — PHOSPHORUS: Phosphorus: 2.7 mg/dL (ref 2.5–4.6)

## 2019-09-04 LAB — GLUCOSE, CAPILLARY
Glucose-Capillary: 110 mg/dL — ABNORMAL HIGH (ref 70–99)
Glucose-Capillary: 115 mg/dL — ABNORMAL HIGH (ref 70–99)
Glucose-Capillary: 125 mg/dL — ABNORMAL HIGH (ref 70–99)
Glucose-Capillary: 128 mg/dL — ABNORMAL HIGH (ref 70–99)
Glucose-Capillary: 87 mg/dL (ref 70–99)
Glucose-Capillary: 98 mg/dL (ref 70–99)

## 2019-09-04 LAB — MAGNESIUM: Magnesium: 1.4 mg/dL — ABNORMAL LOW (ref 1.7–2.4)

## 2019-09-04 MED ORDER — FUROSEMIDE 10 MG/ML IJ SOLN
80.0000 mg | Freq: Once | INTRAMUSCULAR | Status: AC
  Administered 2019-09-04: 09:00:00 80 mg via INTRAVENOUS
  Filled 2019-09-04: qty 8

## 2019-09-04 MED ORDER — SENNA 8.6 MG PO TABS
1.0000 | ORAL_TABLET | Freq: Every day | ORAL | Status: DC | PRN
  Administered 2019-09-05: 11:00:00 8.6 mg via ORAL
  Filled 2019-09-04: qty 1

## 2019-09-04 MED ORDER — SODIUM CHLORIDE 0.9 % IV SOLN
INTRAVENOUS | Status: DC | PRN
  Administered 2019-09-04: 250 mL via INTRAVENOUS

## 2019-09-04 MED ORDER — POLYETHYLENE GLYCOL 3350 17 G PO PACK
17.0000 g | PACK | Freq: Every day | ORAL | Status: DC | PRN
  Administered 2019-09-05: 11:00:00 17 g via ORAL
  Filled 2019-09-04: qty 1

## 2019-09-04 MED ORDER — MAGNESIUM SULFATE 2 GM/50ML IV SOLN
2.0000 g | Freq: Once | INTRAVENOUS | Status: AC
  Administered 2019-09-04: 10:00:00 2 g via INTRAVENOUS
  Filled 2019-09-04: qty 50

## 2019-09-04 MED ORDER — DEXMEDETOMIDINE HCL IN NACL 400 MCG/100ML IV SOLN
0.4000 ug/kg/h | INTRAVENOUS | Status: DC
  Administered 2019-09-04: 04:00:00 1 ug/kg/h via INTRAVENOUS
  Administered 2019-09-04: 09:00:00 0.4 ug/kg/h via INTRAVENOUS
  Administered 2019-09-04: 0.8 ug/kg/h via INTRAVENOUS
  Filled 2019-09-04 (×3): qty 100

## 2019-09-04 MED ORDER — POTASSIUM CHLORIDE 10 MEQ/100ML IV SOLN
10.0000 meq | INTRAVENOUS | Status: AC
  Administered 2019-09-04 (×5): 10 meq via INTRAVENOUS
  Filled 2019-09-04 (×5): qty 100

## 2019-09-04 MED ORDER — POLYETHYLENE GLYCOL 3350 17 G PO PACK
17.0000 g | PACK | Freq: Once | ORAL | Status: AC
  Administered 2019-09-04: 14:00:00 17 g via ORAL
  Filled 2019-09-04: qty 1

## 2019-09-04 MED ORDER — MAGNESIUM SULFATE 2 GM/50ML IV SOLN
2.0000 g | Freq: Once | INTRAVENOUS | Status: AC
  Administered 2019-09-04: 08:00:00 2 g via INTRAVENOUS
  Filled 2019-09-04: qty 50

## 2019-09-04 NOTE — Progress Notes (Signed)
PULMONARY / CRITICAL CARE MEDICINE   NAME:  Justin Bernard, MRN:  [id number], DOB:  1961/04/12, LOS: 4 ADMISSION DATE:  08/30/2019, CONSULTATION DATE: 08/31/19 REFERRING MD: Dr. Regenia Skeeter , CHIEF COMPLAINT:  Syncope  BRIEF HISTORY:    Justin Bernard is a 59 y.o male with vtach/vfib with aicd, hx of etoh use admitted on 08/30/19 after falling out of his bed and found pulseless. CPR performed for 2 minutes and ROSC was obtained. He was brought into the ED and intubated. He was able to be successfully extubated after 1 day 1/16-1/17. He was stabilized and transferred to [facility] hospitalist service on 2-3L via [place].   Overnight (early am) 1/19 subsequent to transfer to floor unit the patient was reported to have difficulty breathing and was placed on a venturi mask 11L which continued to get worse throughout the day. Rapid response was called approximately 1600 1/19 due to patient being obtunded, using accessory muscles, and being somnolent. Patient transferred back to ICU on 1/19 for management of acute hypoxemic and hypercarbic respiratory failure.   SIGNIFICANT PAST MEDICAL HISTORY    AICD (automatic cardioverter/defibrillator) present, Arthritis, Asthma, Cataract, Chronic systolic heart failure ([place]), Diabetes mellitus without complication ([place]), Family history of adverse reaction to anesthesia, History of hiatal hernia, History of kidney stones, Hypertension, Hypokalemia, Neuromuscular disorder ([place]), Osteomyelitis ([place]), Ventricular fibrillation ([place]), and Ventricular tachycardia ([place]).  SIGNIFICANT EVENTS:  1/16 Admit to PCCM, empiric heparin for presumed pe, keppra load  pt agitated given precedex, dec propofol due to low bp 1/17 extubated  1/18 stable respirating on room air, titrated off precedex, transferred to medical telemetry 1/19 early morning patient became hypoxic had to be placed on 11L via venturi mask, throughout day got worse transitioned to nonrebreather, and bipap. Transferred back  to ICU  CULTURES:  1/16 blood culture no growth 1 day 1/15 urine culture  1/5 covid, flu-negative   ANTIBIOTICS:   Ancef 1/16>1/17  LINES/TUBES:  Peripheral IV bilateral antecubital  Peripheral iv right hand  CONSULTANTS:    SUBJECTIVE:  According to elink overnight the patient was agitated, flailing his limbs. Symptoms were not being controlled by ativan so precedex was started.   CONSTITUTIONAL: BP (!) 90/54   Pulse (!) 59   Temp 98.5 F (36.9 C) (Oral)   Resp 13   Ht 6\' 4"  (1.93 m)   Wt 108.2 kg   SpO2 100%   BMI 29.04 kg/m   I/O last 3 completed shifts: In: 1328.1 [P.O.:1040; I.V.:288.1] Out: 2050 [Urine:2050]  FiO2 (%):  [50 %-70 %] 70 %  PHYSICAL EXAM: General: resting comfortably in bed Neuro: Is asleep, able to be woken up easily HEENT:Bipap mask on  Cardiovascular: rrr, s1 and s2 audible Lungs: occasional few rhonchi  Musculoskeletal: edema Skin: ecchymosis on left forearm  ASSESSMENT AND PLAN   Acute hypoxemic and hypoxic respiratory failure  Thought to be secondary to pulmonary edema and HFrEF exacerbation. BNP 667. Patient continues to be afebrile without leukocytosis.   Patient transferred back to icu evening of 1/19 after being placed on bipap. ABG subsequent to bipap therapy was primary respiratory acidosis with secondary metabolic acidosis ph 9.563, pco2 56.9, po2 118, bicarb 24.7  Patient put out 4L after receiving IV lasix 80mg . Weight does not appear to be congruent between floor unit and ICU as the patient was 217lbs 1/19 and 238lbs on 1/20.  -continue IV lasix 80mg  qd  -continue carvedilol 3.125mg  bid  -continue amiodarone 200mg  qd  -continue monitoring I/Os and daily weights  -  daily bmp  Acute metabolic encephalopathy  Alcohol withdrawal Likely due to acute hypoxemic and hypoxic respiratory failure and alcohol withdrawal. Patient required 3 doses of ativan during the day 1/19 but needed to be switched to precedex overnight.    -titrate precedex, currently 0.6 -blood culture 1/16 no growth 3 days  -urine culture 1/16 no growth  Hypomagnesemia  Mg 1.4  -gave magnesium sulfate 2g  Concern for Right Shoulder Osteomyelitis  Right shoulder xray showing chronic changes without acute abnormality. Discontinued cefazolin 1/17.   Alcohol Use Disorder  Alcohol Withdrawal  Last use Friday 1/15. He states he usually drinks 1-2 ounces of bourbon, other hard liquour and beer. He was hospitalized for alcohol withdrawal previously in 2015.   Patient required 3 doses of ativan 1/19 and was switched to precedex overnight.   -thiamine  -folic acid  -multivitamins -titrate off precedex -ativan q6hrs  -versed q4hrs prn  Acute Kidney Injury Resolved   Thrombocytopenia  Platelets 123 increased from 116, likely secondary to alcohol use.  -will follow up cbc 1/20  HFrEF TTE shwoing 40-45% ef, ivc normal in size, left ventricle global hypokinesis.    Best Practice / Goals of Care / Disposition.   DVT PROPHYLAXIS: sq heparin SUP: none NUTRITION: cardiac diet MOBILITY: pt, ot GOALS OF CARE:full FAMILY DISCUSSIONS: Pt stated he did not want me to call family DISPOSITION ICU  LABS  Glucose Recent Labs  Lab 09/01/19 1916 09/02/19 2121 09/03/19 1802 09/03/19 1941 09/04/19 0005 09/04/19 0412  GLUCAP 101* 92 127* 115* 87 115*    BMET Recent Labs  Lab 09/02/19 0907 09/02/19 0907 09/03/19 0542 09/03/19 1912 09/03/19 2019  NA 146*   < > 143 142 144  K 3.9   < > 3.8 3.9 3.2*  CL 116*  --  114* 111  --   CO2 17*  --  21* 22  --   BUN 17  --  12 11  --   CREATININE 1.27*  --  1.02 1.00  --   GLUCOSE 92  --  137* 130*  --    < > = values in this interval not displayed.    Liver Enzymes Recent Labs  Lab 08/30/19 2035 09/01/19 1422 09/02/19 0907  AST 129* 89* 90*  ALT 48* 31 30  ALKPHOS 183* 145* 176*  BILITOT 2.0* 1.1 1.3*  ALBUMIN 3.7 2.6* 2.9*    Electrolytes Recent Labs  Lab  09/02/19 0907 09/03/19 0542 09/03/19 1912 09/04/19 0235  CALCIUM 8.2* 8.1* 8.7*  --   MG 1.8 1.6*  --  1.4*  PHOS 2.7 3.3  --  2.7    CBC Recent Labs  Lab 09/02/19 0907 09/02/19 0907 09/03/19 0542 09/03/19 1912 09/03/19 2019  WBC 6.3  --  6.7 6.5  --   HGB 12.3*   < > 11.7* 13.8 13.9  HCT 41.7   < > 40.0 47.9 41.0  PLT 141*  --  193 159  --    < > = values in this interval not displayed.    ABG Recent Labs  Lab 08/31/19 0829 09/03/19 1552 09/03/19 2019  PHART 7.351 7.063* 7.245*  PCO2ART 38.2 82.7* 56.9*  PO2ART 71.0* 116* 118.0*    Coag's No results for input(s): APTT, INR in the last 168 hours.  Sepsis Markers Recent Labs  Lab 08/31/19 0037 08/31/19 0238 08/31/19 0510 09/01/19 0249 09/02/19 0328  LATICACIDVEN  --  1.3 1.1  --   --   PROCALCITON 0.32  --   --  0.12 0.16    Cardiac Enzymes No results for input(s): TROPONINI, PROBNP in the last 168 hours.  REVIEW OF SYSTEMS:     Patient doing well. Was conversive.   Lorenso Courier, MD Internal Medicine PGY3

## 2019-09-04 NOTE — Progress Notes (Signed)
Patient extremely agitated and trying to hurt himself. He is flailing around and trying to get out of bed. He is on the CIWA protocol and the ordered Ativan is not helping his withdrawals. Staying with the patient until I feel he is safe.  Informed MD, awaiting orders.   Cranford Mon

## 2019-09-04 NOTE — Progress Notes (Signed)
Physical Therapy Treatment Patient Details Name: Justin Bernard MRN: [id number] DOB: [birth year] Today's Date: 09/04/2019    History of Present Illness 59 y/o with  PMH of NICM, HFrEF, essential tremors, gait disorder, vtach/vfib with AICD and ETOH abuse who fell at home, EMS called and did not feel a pulse, ROSC with 2 minutes CPR and then concern for stiffening and posturing.  Pt was hypoxic and confused in the ED and then intubated.Acute respiratory failure secondary to acute metabolic encephalopathy.  Possible alcohol withdrawal.  Possible seizures    PT Comments    Patient progressing slowly due to continued hypotension when up in chair with some dizziness.  He continues to be a high fall risk.  Feel his best option would be post-acute rehab in CIR setting to maximize mobility, safety, independence and activity tolerance.  PT will continue to follow acutely.   Follow Up Recommendations  Supervision/Assistance - 24 hour;CIR     Equipment Recommendations  None recommended by PT    Recommendations for Other Services       Precautions / Restrictions Precautions Precautions: Fall Precaution Comments: watch BP and sats    Mobility  Bed Mobility Overal bed mobility: Needs Assistance Bed Mobility: Supine to Sit     Supine to sit: Min assist;HOB elevated     General bed mobility comments: assist for lines, trunk  Transfers Overall transfer level: Needs assistance Equipment used: Rolling walker (2 wheeled) Transfers: Stand Pivot Transfers;Sit to/from Stand Sit to Stand: Min assist;+2 safety/equipment Stand pivot transfers: Min assist;+2 physical assistance;+2 safety/equipment       General transfer comment: assist up to RW to stand and step to chair with +2 for safety/balance, lines  Ambulation/Gait             General Gait Details: NT due to dizziness, hypotension   Stairs             Wheelchair Mobility    Modified Rankin (Stroke Patients  Only)       Balance Overall balance assessment: Needs assistance Sitting-balance support: Feet unsupported Sitting balance-Leahy Scale: Fair Sitting balance - Comments: flexed posture at EOB no UE support   Standing balance support: Bilateral upper extremity supported Standing balance-Leahy Scale: Poor Standing balance comment: UE support for balance                            Cognition Arousal/Alertness: Awake/alert Behavior During Therapy: Flat affect Overall Cognitive Status: Impaired/Different from baseline Area of Impairment: Following commands;Problem solving;Safety/judgement;Orientation                 Orientation Level: Time;Disoriented to;Situation     Following Commands: Follows one step commands with increased time Safety/Judgement: Decreased awareness of safety;Decreased awareness of deficits   Problem Solving: Slow processing;Requires verbal cues;Requires tactile cues;Decreased initiation General Comments: reports feeling confused today, still on some sedation, RN titrating down      Exercises General Exercises - Lower Extremity Ankle Circles/Pumps: AROM;10 reps;Supine Long Arc Quad: AROM;10 reps;Seated Heel Slides: AROM;5 reps;Supine Hip Flexion/Marching: AAROM;5 reps;Seated    General Comments General comments (skin integrity, edema, etc.): BP in supine 102/63, seated 91/67, after up to chair 77/56, RN in room and aware, reclined more in chair; on HFNC at 6LPM throughout with SpO2 in 90's      Pertinent Vitals/Pain Pain Assessment: No/denies pain    Home Living  Prior Function            PT Goals (current goals can now be found in the care plan section) Progress towards PT goals: Progressing toward goals    Frequency    Min 3X/week      PT Plan Discharge plan needs to be updated    Co-evaluation              AM-PAC PT "6 Clicks" Mobility   Outcome Measure  Help needed turning from  your back to your side while in a flat bed without using bedrails?: None Help needed moving from lying on your back to sitting on the side of a flat bed without using bedrails?: A Little Help needed moving to and from a bed to a chair (including a wheelchair)?: A Little Help needed standing up from a chair using your arms (e.g., wheelchair or bedside chair)?: A Little Help needed to walk in hospital room?: A Lot Help needed climbing 3-5 steps with a railing? : A Lot 6 Click Score: 17    End of Session Equipment Utilized During Treatment: Oxygen Activity Tolerance: Treatment limited secondary to medical complications (Comment)(limited by hypotension) Patient left: in chair;with call bell/phone within reach;with chair alarm set;with nursing/sitter in room Nurse Communication: Mobility status PT Visit Diagnosis: Other abnormalities of gait and mobility (R26.89);Difficulty in walking, not elsewhere classified (R26.2);History of falling (Z91.81)     Time: 5329-9242 PT Time Calculation (min) (ACUTE ONLY): 24 min  Charges:  $Therapeutic Exercise: 8-22 mins $Therapeutic Activity: 8-22 mins                     Sheran Lawless, [facility] [phone] 09/04/2019    Elray Mcgregor 09/04/2019, 12:11 PM

## 2019-09-04 NOTE — Progress Notes (Signed)
eLink Physician-Brief Progress Note Patient Name: Justin Bernard DOB: 23-Feb-1961 MRN: [id number]   Date of Service  09/04/2019  HPI/Events of Note  Pt extremely agitated, pulling at his mask, flailing his limbs, he is on CIWA protocol but Ativan is not adequately sedating him.  eICU Interventions  Precedex infusion ordered,  Sitter ordered.        Thomasene Lot [name] 09/04/2019, 12:04 AM

## 2019-09-04 NOTE — Progress Notes (Signed)
Rehab Admissions Coordinator Note:  Per PT recommendation, this patient was screened by Cheri Rous for appropriateness for an Inpatient Acute Rehab Consult.  At this time, we are recommending an  Inpatient Rehab consult.   Please have MD place consult order in the chart to allow for further follow up and assessment of pt's candidacy for CIR.   Cheri Rous 09/04/2019, 12:30 PM  I can be reached at [phone].

## 2019-09-05 DIAGNOSIS — G9341 Metabolic encephalopathy: Secondary | ICD-10-CM

## 2019-09-05 LAB — BASIC METABOLIC PANEL
Anion gap: 13 (ref 5–15)
BUN: 12 mg/dL (ref 6–20)
CO2: 26 mmol/L (ref 22–32)
Calcium: 8.5 mg/dL — ABNORMAL LOW (ref 8.9–10.3)
Chloride: 105 mmol/L (ref 98–111)
Creatinine, Ser: 0.94 mg/dL (ref 0.61–1.24)
GFR calc Af Amer: >60 mL/min (ref >60)
GFR calc non Af Amer: >60 mL/min (ref >60)
Glucose, Bld: 131 mg/dL — ABNORMAL HIGH (ref 70–99)
Potassium: 3 mmol/L — ABNORMAL LOW (ref 3.5–5.1)
Sodium: 144 mmol/L (ref 135–145)

## 2019-09-05 LAB — CULTURE, BLOOD (ROUTINE X 2)
Culture: NO GROWTH
Culture: NO GROWTH

## 2019-09-05 LAB — GLUCOSE, CAPILLARY: Glucose-Capillary: 99 mg/dL (ref 70–99)

## 2019-09-05 LAB — MAGNESIUM: Magnesium: 1.8 mg/dL (ref 1.7–2.4)

## 2019-09-05 LAB — PHOSPHORUS: Phosphorus: 1.5 mg/dL — ABNORMAL LOW (ref 2.5–4.6)

## 2019-09-05 MED ORDER — POTASSIUM PHOSPHATE MONOBASIC 500 MG PO TABS
500.0000 mg | ORAL_TABLET | Freq: Two times a day (BID) | ORAL | Status: DC
  Filled 2019-09-05: qty 1

## 2019-09-05 MED ORDER — MAGNESIUM SULFATE 2 GM/50ML IV SOLN
2.0000 g | Freq: Once | INTRAVENOUS | Status: AC
  Administered 2019-09-05: 2 g via INTRAVENOUS
  Filled 2019-09-05: qty 50

## 2019-09-05 MED ORDER — LORAZEPAM 1 MG PO TABS
1.0000 mg | ORAL_TABLET | ORAL | Status: DC | PRN
  Administered 2019-09-06 – 2019-09-07 (×3): 1 mg via ORAL
  Administered 2019-09-08: 2 mg via ORAL
  Administered 2019-09-08: 1 mg via ORAL
  Filled 2019-09-05 (×3): qty 1
  Filled 2019-09-05: qty 2
  Filled 2019-09-05: qty 1

## 2019-09-05 MED ORDER — POTASSIUM CHLORIDE CRYS ER 20 MEQ PO TBCR
40.0000 meq | EXTENDED_RELEASE_TABLET | Freq: Once | ORAL | Status: AC
  Administered 2019-09-05: 11:00:00 40 meq via ORAL
  Filled 2019-09-05: qty 2

## 2019-09-05 MED ORDER — CHLORDIAZEPOXIDE HCL 25 MG PO CAPS
25.0000 mg | ORAL_CAPSULE | Freq: Every day | ORAL | Status: AC
  Administered 2019-09-07: 25 mg via ORAL
  Filled 2019-09-05: qty 1

## 2019-09-05 MED ORDER — POTASSIUM CHLORIDE CRYS ER 20 MEQ PO TBCR: 40.0000 meq | EXTENDED_RELEASE_TABLET | Freq: Once | ORAL | Status: DC

## 2019-09-05 MED ORDER — POTASSIUM PHOSPHATES 15 MMOLE/5ML IV SOLN
30.0000 mmol | Freq: Once | INTRAVENOUS | Status: AC
  Administered 2019-09-05: 11:00:00 30 mmol via INTRAVENOUS
  Filled 2019-09-05: qty 10

## 2019-09-05 MED ORDER — FOLIC ACID 1 MG PO TABS
1.0000 mg | ORAL_TABLET | Freq: Every day | ORAL | Status: DC
  Administered 2019-09-05 – 2019-09-10 (×6): 1 mg via ORAL
  Filled 2019-09-05 (×6): qty 1

## 2019-09-05 MED ORDER — FUROSEMIDE 10 MG/ML IJ SOLN
80.0000 mg | Freq: Once | INTRAMUSCULAR | Status: AC
  Administered 2019-09-05: 80 mg via INTRAVENOUS
  Filled 2019-09-05: qty 8

## 2019-09-05 MED ORDER — ADULT MULTIVITAMIN W/MINERALS CH
1.0000 | ORAL_TABLET | Freq: Every day | ORAL | Status: DC
  Administered 2019-09-05 – 2019-09-10 (×6): 1 via ORAL
  Filled 2019-09-05 (×6): qty 1

## 2019-09-05 MED ORDER — CARVEDILOL 6.25 MG PO TABS
6.2500 mg | ORAL_TABLET | Freq: Two times a day (BID) | ORAL | Status: DC
  Administered 2019-09-05 – 2019-09-10 (×7): 6.25 mg via ORAL
  Filled 2019-09-05 (×8): qty 1

## 2019-09-05 MED ORDER — CHLORDIAZEPOXIDE HCL 25 MG PO CAPS
25.0000 mg | ORAL_CAPSULE | Freq: Two times a day (BID) | ORAL | Status: AC
  Administered 2019-09-06 (×2): 25 mg via ORAL
  Filled 2019-09-05 (×2): qty 1

## 2019-09-05 MED ORDER — ACETAMINOPHEN 325 MG PO TABS
650.0000 mg | ORAL_TABLET | Freq: Once | ORAL | Status: AC
  Administered 2019-09-05: 20:00:00 650 mg via ORAL
  Filled 2019-09-05: qty 2

## 2019-09-05 MED ORDER — CHLORDIAZEPOXIDE HCL 25 MG PO CAPS
25.0000 mg | ORAL_CAPSULE | Freq: Three times a day (TID) | ORAL | Status: AC
  Administered 2019-09-05 (×3): 25 mg via ORAL
  Filled 2019-09-05 (×3): qty 1

## 2019-09-05 NOTE — Progress Notes (Signed)
Spoke with a triad hospitalist who agreed to assume care of the patient as of 7am on 1/22. We appreciate their assistance with the care of our patient.   Lanelle Bal, MD Critical Care Resident, PGY-3  Please see Attending A/P and/or Addendum for final recommendations.

## 2019-09-05 NOTE — Progress Notes (Addendum)
PULMONARY / CRITICAL CARE MEDICINE   NAME:  Justin Bernard, MRN:  [id number], DOB:  March 04, 1961, LOS: 5 ADMISSION DATE:  08/30/2019, CONSULTATION DATE: 08/31/19 REFERRING MD: Dr. Criss Alvine , CHIEF COMPLAINT:  Syncope  BRIEF HISTORY:    Justin Bernard is a 59 y.o male with a history of vtach/vfib s/p ACID, and a hx of EtOH use disorder who was admitted on 08/30/19 after falling out of his bed and being found pulseless. CPR was performed for ~2 minutes prior to ROSC. He was brought into the ED where he was intubated for airway protection and hypercarbia. He was able to be successfully extubated after a single day 1/16-1/17. He was stabilized and transferred to triad hospitalist service on 2-3L via [place].   Overnight (early am) 1/19 he was subsequently transferred back to the ICU after the patient was reported to have difficulty breathing and was placed on a venturi mask 11L without improvement. Rapid response was called approximately 1600 1/19 due to patient being obtunded with use of accessory muscles noted. Patient transferred back to ICU on 1/19 for management of combined acute hypoxemic and hypercarbic respiratory failure.   SIGNIFICANT PAST MEDICAL HISTORY   AICD (automatic cardioverter/defibrillator) present, Arthritis, Asthma, Cataract, Chronic systolic heart failure (HCC), Diabetes mellitus without complication (HCC), Family history of adverse reaction to anesthesia, History of hiatal hernia, History of kidney stones, Hypertension, Hypokalemia, Neuromuscular disorder (HCC), Osteomyelitis (HCC), Ventricular fibrillation (HCC), and Ventricular tachycardia (HCC).  SIGNIFICANT EVENTS:  1/16 Admit to PCCM, empiric heparin for presumed pe, keppra load  pt agitated given precedex, dec propofol due to low bp 1/17 extubated  1/18 stable respirating on room air, titrated off precedex, transferred to medical telemetry 1/19 early morning patient became hypoxic had to be placed on 11L via venturi mask,  throughout day got worse transitioned to nonrebreather, and bipap. Transferred back to ICU 1/21 Patient feeling improved. Denied acute complaints.   CULTURES:  1/16 blood culture NGTD 1/15 urine culture NGTD 1/5 covid, flu-negative   ANTIBIOTICS:  Ancef 1/16>1/17  LINES/TUBES:  Peripheral IV bilateral antecubital  Peripheral iv right hand  CONSULTANTS:  PCCM  SUBJECTIVE:  The patient stated that he slept well overnight and denied acute complaints. He denied pain, nausea, weakness, vomiting or loose stool.   CONSTITUTIONAL: BP 103/71   Pulse 91   Temp 98.5 F (36.9 C) (Oral)   Resp (!) 28   Ht 6\' 4"  (1.93 m)   Wt 109.2 kg   SpO2 (!) 88%   BMI 29.30 kg/m   I/O last 3 completed shifts: In: 1303.5 [P.O.:480; I.V.:235.5; IV Piggyback:588] Out: 7045 [Urine:7045]  FiO2 (%):  [60 %] 60 %  PHYSICAL EXAM: General: A/O x4, in no acute distress, afebrile, nondiaphoretic HEENT: PEERL, EMO intact Cardio: RRR, no mrg's  Pulmonary: CTA bilaterally, no wheezing or crackles  Abdomen: Bowel sounds normal, soft, nontender  MSK: BLE nontender,+1 edema bilaterally  Neuro: Alert, conversational Psych: Appropriate affect, not depressed in appearance, engages well  I/O: I In 1.1L, Out 4L  ASSESSMENT AND PLAN   Acute hypoxemic and hypercapnic respiratory failure  Thought to be secondary to pulmonary edema and HFrEF exacerbation with LVEF of 40-45%. BNP minimally elevated to 667. Patient continues to be afebrile without a leukocytosis.  Patient put out 4L after receiving IV lasix 80mg  on 1/19 and another 4L on 1/20 with improvement in his symptoms.  Plan -Stable for transfer to the floor today -Continue IV lasix 80mg  again today -Increase carvedilol 6.25mg  bid  -Continue  amiodarone 200mg  qd  -Continue monitoring I/Os and daily weights  -Trend daily bmp's  Acute metabolic encephalopathy  Alcohol withdrawal Likely due to acute hypoxemic and hypercapnic respiratory failure with  possible alcohol withdrawal although we should be out of the window for this by now. Patient required 3 doses of ativan during the day 1/19 but needed to be switched to precedex at one point. He is now OFF precedex. Plan -Discontinue precedex from system -Start librium taper at 25mg  TID today followed by 25mg  BID tomorrow and 25mg  once on day three  Hypomagnesemia  Hypophosphatemia  Likely 2/2 to poor baseline nutrition and large volume diuresis.  Plan -Monitor potassium, mag and phos daily -Replete as indicated  Concern for Right Shoulder Osteomyelitis  Right shoulder xray showing chronic changes without acute abnormality. Discontinued cefazolin 1/17.   Alcohol Use Disorder  Last use Friday 1/15. He states he usually drinks 1-2 ounces of bourbon, other hard liquour and beer daily. He was hospitalized for alcohol withdrawal previously in 2015.  Plan -Thiamine  -Golic acid  -Multivitamins  Acute Kidney Injury Resolved.  HFrEF TTE shwoing 40-45% ef, ivc normal in size, left ventricle global hypokinesis.    Best Practice / Goals of Care / Disposition.   DVT PROPHYLAXIS: sq heparin SUP: none NUTRITION: cardiac diet MOBILITY: pt, ot GOALS OF CARE:full FAMILY DISCUSSIONS: Pt stated he did not want me to call family DISPOSITION ICU  LABS  Glucose Recent Labs  Lab 09/04/19 0005 09/04/19 0412 09/04/19 0756 09/04/19 1149 09/04/19 1541 09/04/19 1954  GLUCAP 87 115* 125* 110* 98 128*   BMET Recent Labs  Lab 09/03/19 1912 09/03/19 1912 09/03/19 2019 09/04/19 0729 09/04/19 1431  NA 142   < > 144 144 143  K 3.9   < > 3.2* 3.2* 3.5  CL 111  --   --  109 107  CO2 22  --   --  26 26  BUN 11  --   --  11 14  CREATININE 1.00  --   --  0.99 1.06  GLUCOSE 130*  --   --  139* 113*   < > = values in this interval not displayed.   Liver Enzymes Recent Labs  Lab 08/30/19 2035 09/01/19 1422 09/02/19 0907  AST 129* 89* 90*  ALT 48* 31 30  ALKPHOS 183* 145* 176*  BILITOT  2.0* 1.1 1.3*  ALBUMIN 3.7 2.6* 2.9*    Electrolytes Recent Labs  Lab 09/03/19 0542 09/03/19 0542 09/03/19 1912 09/04/19 0235 09/04/19 0729 09/04/19 1431 09/05/19 0237  CALCIUM 8.1*   < > 8.7*  --  8.1* 8.7*  --   MG 1.6*  --   --  1.4*  --   --  1.8  PHOS 3.3  --   --  2.7  --   --  1.5*   < > = values in this interval not displayed.   CBC Recent Labs  Lab 09/03/19 0542 09/03/19 0542 09/03/19 1912 09/03/19 2019 09/04/19 0729  WBC 6.7  --  6.5  --  3.7*  HGB 11.7*   < > 13.8 13.9 11.0*  HCT 40.0   < > 47.9 41.0 36.7*  PLT 193  --  159  --  129*   < > = values in this interval not displayed.   ABG Recent Labs  Lab 08/31/19 0829 09/03/19 1552 09/03/19 2019  PHART 7.351 7.063* 7.245*  PCO2ART 38.2 82.7* 56.9*  PO2ART 71.0* 116* 118.0*   Coag's No results  for input(s): APTT, INR in the last 168 hours.  Sepsis Markers Recent Labs  Lab 08/31/19 0037 08/31/19 0238 08/31/19 0510 09/01/19 0249 09/02/19 0328  LATICACIDVEN  --  1.3 1.1  --   --   PROCALCITON 0.32  --   --  0.12 0.16   Cardiac Enzymes No results for input(s): TROPONINI, PROBNP in the last 168 hours.  REVIEW OF SYSTEMS:     Kathi Ludwig, MD [facility] Internal Medicine, PGY-3  Please see Attending A/P and/or Addendum for final recommendations.

## 2019-09-06 LAB — BASIC METABOLIC PANEL
Anion gap: 11 (ref 5–15)
BUN: 10 mg/dL (ref 6–20)
CO2: 29 mmol/L (ref 22–32)
Calcium: 8.4 mg/dL — ABNORMAL LOW (ref 8.9–10.3)
Chloride: 100 mmol/L (ref 98–111)
Creatinine, Ser: 0.84 mg/dL (ref 0.61–1.24)
GFR calc Af Amer: >60 mL/min (ref >60)
GFR calc non Af Amer: >60 mL/min (ref >60)
Glucose, Bld: 124 mg/dL — ABNORMAL HIGH (ref 70–99)
Potassium: 2.9 mmol/L — ABNORMAL LOW (ref 3.5–5.1)
Sodium: 140 mmol/L (ref 135–145)

## 2019-09-06 LAB — PHOSPHORUS: Phosphorus: 2.5 mg/dL (ref 2.5–4.6)

## 2019-09-06 LAB — MAGNESIUM: Magnesium: 1.8 mg/dL (ref 1.7–2.4)

## 2019-09-06 MED ORDER — POTASSIUM CHLORIDE 10 MEQ/100ML IV SOLN
10.0000 meq | INTRAVENOUS | Status: AC
  Administered 2019-09-06 (×2): 10 meq via INTRAVENOUS
  Filled 2019-09-06: qty 100

## 2019-09-06 MED ORDER — POTASSIUM CHLORIDE CRYS ER 20 MEQ PO TBCR
40.0000 meq | EXTENDED_RELEASE_TABLET | Freq: Two times a day (BID) | ORAL | Status: AC
  Administered 2019-09-06 (×2): 40 meq via ORAL
  Filled 2019-09-06 (×2): qty 2

## 2019-09-06 MED ORDER — ONDANSETRON HCL 4 MG/2ML IJ SOLN
4.0000 mg | Freq: Four times a day (QID) | INTRAMUSCULAR | Status: DC | PRN
  Administered 2019-09-06: 4 mg via INTRAVENOUS
  Filled 2019-09-06: qty 2

## 2019-09-06 MED ORDER — POTASSIUM CHLORIDE 20 MEQ PO PACK: 40.0000 meq | PACK | Freq: Two times a day (BID) | ORAL | Status: DC

## 2019-09-06 MED ORDER — POTASSIUM CHLORIDE 10 MEQ/100ML IV SOLN
INTRAVENOUS | Status: AC
  Filled 2019-09-06: qty 100

## 2019-09-06 NOTE — Progress Notes (Signed)
PROGRESS NOTE    Justin Bernard  EZM:[id number] DOB: 03-15-1961 DOA: 08/30/2019 PCP: Jarrett Soho, PA-C   Brief Narrative:  59 y.o.M with PMH of NICM, HFrEF, vtach/vfib with AICD and ETOH abuse who presents with a fall out of a chair at home, per report fall from standing. When EMS arrived, there was concern for loss of pulses and about 2 minutes of CPR performed with ROSC and then stiffening and posturing concerning for seizure. Pt in sinus rhythm on arrival to ED and awake though confused. Became increasingly agitated despite Ativan. He was hypoxic into 70%'s requiring facemask oxygen. Head CT was negative and pt was intubated for hypoxia and agitation. He was able to be successfully extubated after a single day 1/16-1/17. He was stabilized and transferred to triad hospitalist serviceon 2-3L via [place].  Overnight (early am) 1/19 he was subsequently transferred back to the ICU after the patient was reported to have difficulty breathing and was placed on a venturi mask 11L without improvement. Rapid response was calledapproximately 1600 1/19due to patient being obtunded with use of accessory muscles noted. Patient transferred back to ICU on 1/19 for management of combined acute hypoxemic and hypercarbic respiratory failure.  He successfully extubated.  He is requiring 5 L supplemental oxygen,saturating 94%. BIPAP at night and PRN.   Assessment & Plan:   Active Problems:   Alcohol withdrawal syndrome, with delirium (HCC)   Syncope   Encephalopathy   Acute respiratory failure (HCC)  Acute hypoxemic and hypercapnic respiratory failure  Thought to be secondary to pulmonary edema and HFrEF exacerbation with LVEF of 40-45%. BNP minimally elevated to 667. Patient continues to be afebrile without a leukocytosis.  Patient put out 4L after receiving IV lasix 80mg  on 1/19 and another 4L on 1/20 with improvement in his symptoms.  Plan :  -Stable for transfer to the floor  today -Continue IV lasix 80mg  again today -Increase carvedilol 6.25mg  bid  -Continue amiodarone 200mg  qd  -Continue monitoring I/Os and daily weights  -Trend daily bmp's  Acute metabolic encephalopathy  Alcohol withdrawal Likely due to acute hypoxemic and hypercapnic respiratory failure with possible alcohol withdrawal although he should be out of the window for this by now. Patient required 3 doses of ativan during the day 1/19 but needed to be switched to precedex at one point. He is now OFF precedex. Plan -Discontinue precedex from system. -Start librium taper at 25mg  TID today followed by 25mg  BID tomorrow and 25mg  once on day three  Hypomagnesemia  Hypophosphatemia  Likely 2/2 to poor baseline nutrition and large volume diuresis.  Plan -Monitor potassium, mag and phos daily -Replete as indicated  Concern for Right Shoulder Osteomyelitis  Right shoulder xray showing chronic changes without acute abnormality. Discontinued cefazolin 1/17.   Alcohol Use Disorder  Last use Friday 1/15. He states he usually drinks 1-2 ounces of bourbon, other hard liquour and beer daily. He was hospitalized for alcohol withdrawal previously in 2015.  Plan -Thiamine  -Golic acid  -Multivitamins  Acute Kidney Injury Resolved.  HFrEF TTE shwoing 40-45% ef, ivc normal in size, left ventricle global hypokinesis.     DVT prophylaxis: Heparin sq Code Status: Full Family Communication: No one at bed side. Disposition Plan:  Inpatient rehab once medically cleared. Consultants:   PCCM  Procedures: Antimicrobials: Anti-infectives (From admission, onward)   Start     Dose/Rate Route Frequency Ordered Stop   08/31/19 0030  ceFAZolin (ANCEF) IVPB 2g/100 mL premix  Status:  Discontinued  2 g 200 mL/hr over 30 Minutes Intravenous Every 8 hours 08/31/19 0028 09/01/19 1417   08/31/19 0015  vancomycin (VANCOREADY) IVPB 2000 mg/400 mL     2,000 mg 200 mL/hr over 120 Minutes Intravenous   Once 08/31/19 0003 08/31/19 0311   08/31/19 0015  ceFEPIme (MAXIPIME) 2 g in sodium chloride 0.9 % 100 mL IVPB  Status:  Discontinued     2 g 200 mL/hr over 30 Minutes Intravenous  Once 08/31/19 0003 08/31/19 0028      Subjective: Patient was seen and examined at bedside,  He appears more comfortable, He was having a bowel movement when he was seen.  Denies any chest pain or other concerns.  No overnight events.  Objective: Vitals:   09/06/19 0000 09/06/19 0100 09/06/19 0220 09/06/19 0912  BP: 93/62 (!) 102/59 101/70 (!) 121/97  Pulse: 80 84 83 92  Resp: 20   (!) 21  Temp:   98.2 F (36.8 C) 98.9 F (37.2 C)  TempSrc:   Oral Oral  SpO2: 98% 99% 95% 94%  Weight:      Height:        Intake/Output Summary (Last 24 hours) at 09/06/2019 1553 Last data filed at 09/06/2019 1500 Gross per 24 hour  Intake 1026.96 ml  Output 1725 ml  Net -698.04 ml   Filed Weights   09/03/19 1800 09/04/19 0500 09/05/19 0400  Weight: 98.7 kg 108.2 kg 109.2 kg    Examination:  General exam: Appears calm and comfortable  Respiratory system: Clear to auscultation. Respiratory effort normal. Cardiovascular system: S1 & S2 heard, RRR. No JVD, murmurs, rubs, gallops or clicks. No pedal edema. Gastrointestinal system: Abdomen is nondistended, soft and nontender. No organomegaly or masses felt. Normal bowel sounds heard. Central nervous system: Alert and oriented. No focal neurological deficits. Extremities: No swelling, No edema. Skin: No rashes, lesions or ulcers Psychiatry: Judgement and insight appear normal. Mood & affect appropriate.    Data Reviewed: I have personally reviewed following labs and imaging studies  CBC: Recent Labs  Lab 08/31/19 0037 08/31/19 0051 09/02/19 0328 09/02/19 0328 09/02/19 0907 09/03/19 0542 09/03/19 1912 09/03/19 2019 09/04/19 0729  WBC 9.6   < > 5.5  --  6.3 6.7 6.5  --  3.7*  NEUTROABS 8.0*  --   --   --   --   --   --   --   --   HGB 13.8   < > 11.8*   <  > 12.3* 11.7* 13.8 13.9 11.0*  HCT 46.8   < > 39.4   < > 41.7 40.0 47.9 41.0 36.7*  MCV 105.2*   < > 107.1*  --  106.6* 107.2* 109.1*  --  105.8*  PLT 124*   < > 123*  --  141* 193 159  --  129*   < > = values in this interval not displayed.   Basic Metabolic Panel: Recent Labs  Lab 09/02/19 0907 09/02/19 0907 09/03/19 0542 09/03/19 0542 09/03/19 1912 09/03/19 1912 09/03/19 2019 09/04/19 0235 09/04/19 0729 09/04/19 1431 09/05/19 0237 09/05/19 0822 09/06/19 0420  NA 146*   < > 143   < > 142   < > 144  --  144 143  --  144 140  K 3.9   < > 3.8   < > 3.9   < > 3.2*  --  3.2* 3.5  --  3.0* 2.9*  CL 116*   < > 114*   < >  111  --   --   --  109 107  --  105 100  CO2 17*   < > 21*   < > 22  --   --   --  26 26  --  26 29  GLUCOSE 92   < > 137*   < > 130*  --   --   --  139* 113*  --  131* 124*  BUN 17   < > 12   < > 11  --   --   --  11 14  --  12 10  CREATININE 1.27*   < > 1.02   < > 1.00  --   --   --  0.99 1.06  --  0.94 0.84  CALCIUM 8.2*   < > 8.1*   < > 8.7*  --   --   --  8.1* 8.7*  --  8.5* 8.4*  MG 1.8  --  1.6*  --   --   --   --  1.4*  --   --  1.8  --  1.8  PHOS 2.7  --  3.3  --   --   --   --  2.7  --   --  1.5*  --  2.5   < > = values in this interval not displayed.   GFR: Estimated Creatinine Clearance: 131.5 mL/min (by C-G formula based on SCr of 0.84 mg/dL). Liver Function Tests: Recent Labs  Lab 08/30/19 2035 09/01/19 1422 09/02/19 0907  AST 129* 89* 90*  ALT 48* 31 30  ALKPHOS 183* 145* 176*  BILITOT 2.0* 1.1 1.3*  PROT 7.0 5.2* 5.9*  ALBUMIN 3.7 2.6* 2.9*   Recent Labs  Lab 08/31/19 0510  LIPASE 60*   Recent Labs  Lab 08/31/19 0510  AMMONIA 68*   Coagulation Profile: No results for input(s): INR, PROTIME in the last 168 hours. Cardiac Enzymes: Recent Labs  Lab 08/31/19 0917  CKTOTAL 215   BNP (last 3 results) No results for input(s): PROBNP in the last 8760 hours. HbA1C: No results for input(s): HGBA1C in the last 72  hours. CBG: Recent Labs  Lab 09/04/19 0756 09/04/19 1149 09/04/19 1541 09/04/19 1954 09/05/19 0746  GLUCAP 125* 110* 98 128* 99   Lipid Profile: No results for input(s): CHOL, HDL, LDLCALC, TRIG, CHOLHDL, LDLDIRECT in the last 72 hours. Thyroid Function Tests: No results for input(s): TSH, T4TOTAL, FREET4, T3FREE, THYROIDAB in the last 72 hours. Anemia Panel: No results for input(s): VITAMINB12, FOLATE, FERRITIN, TIBC, IRON, RETICCTPCT in the last 72 hours. Sepsis Labs: Recent Labs  Lab 08/31/19 0037 08/31/19 0238 08/31/19 0510 09/01/19 0249 09/02/19 0328  PROCALCITON 0.32  --   --  0.12 0.16  LATICACIDVEN  --  1.3 1.1  --   --     Recent Results (from the past 240 hour(s))  Respiratory Panel by RT PCR (Flu A&B, Covid) - Nasopharyngeal Swab     Status: None   Collection Time: 08/30/19  9:10 PM   Specimen: Nasopharyngeal Swab  Result Value Ref Range Status   SARS Coronavirus 2 by RT PCR NEGATIVE NEGATIVE Final    Comment: (NOTE) SARS-CoV-2 target nucleic acids are NOT DETECTED. The SARS-CoV-2 RNA is generally detectable in upper respiratoy specimens during the acute phase of infection. The lowest concentration of SARS-CoV-2 viral copies this assay can detect is 131 copies/mL. A negative result does not preclude SARS-Cov-2 infection and should not be used as  the sole basis for treatment or other patient management decisions. A negative result may occur with  improper specimen collection/handling, submission of specimen other than nasopharyngeal swab, presence of viral mutation(s) within the areas targeted by this assay, and inadequate number of viral copies (<131 copies/mL). A negative result must be combined with clinical observations, patient history, and epidemiological information. The expected result is Negative. Fact Sheet for Patients:  https://www.moore.com/ Fact Sheet for Healthcare Providers:   https://www.young.biz/ This test is not yet ap proved or cleared by the [place] FDA and  has been authorized for detection and/or diagnosis of SARS-CoV-2 by FDA under an Emergency Use Authorization (EUA). This EUA will remain  in effect (meaning this test can be used) for the duration of the COVID-19 declaration under Section 564(b)(1) of the Act, 21 U.S.C. section 360bbb-3(b)(1), unless the authorization is terminated or revoked sooner.    Influenza A by PCR NEGATIVE NEGATIVE Final   Influenza B by PCR NEGATIVE NEGATIVE Final    Comment: (NOTE) The Xpert Xpress SARS-CoV-2/FLU/RSV assay is intended as an aid in  the diagnosis of influenza from Nasopharyngeal swab specimens and  should not be used as a sole basis for treatment. Nasal washings and  aspirates are unacceptable for Xpert Xpress SARS-CoV-2/FLU/RSV  testing. Fact Sheet for Patients: https://www.moore.com/ Fact Sheet for Healthcare Providers: https://www.young.biz/ This test is not yet approved or cleared by the [place] FDA and  has been authorized for detection and/or diagnosis of SARS-CoV-2 by  FDA under an Emergency Use Authorization (EUA). This EUA will remain  in effect (meaning this test can be used) for the duration of the  Covid-19 declaration under Section 564(b)(1) of the Act, 21  U.S.C. section 360bbb-3(b)(1), unless the authorization is  terminated or revoked. Performed at [facility], [address] [place], [place] [address]   Urine culture     Status: None   Collection Time: 08/31/19  1:48 AM   Specimen: Urine, Random  Result Value Ref Range Status   Specimen Description URINE, RANDOM  Final   Special Requests NONE  Final   Culture   Final    NO GROWTH Performed at [facility], [address], [place], [place] [address]    Report Status 09/01/2019 FINAL  Final  Culture, blood (routine x 2)     Status: None    Collection Time: 08/31/19  2:18 AM   Specimen: BLOOD  Result Value Ref Range Status   Specimen Description BLOOD LEFT HAND  Final   Special Requests   Final    BOTTLES DRAWN AEROBIC ONLY Blood Culture results may not be optimal due to an inadequate volume of blood received in culture bottles   Culture   Final    NO GROWTH 5 DAYS Performed at [facility], [address], [place], [place] [address]    Report Status 09/05/2019 FINAL  Final  Culture, blood (routine x 2)     Status: None   Collection Time: 08/31/19  2:38 AM   Specimen: BLOOD  Result Value Ref Range Status   Specimen Description BLOOD LEFT HAND  Final   Special Requests   Final    BOTTLES DRAWN AEROBIC ONLY Blood Culture results may not be optimal due to an inadequate volume of blood received in culture bottles   Culture   Final    NO GROWTH 5 DAYS Performed at [facility], [address], [place], [place] [address]  Report Status 09/05/2019 FINAL  Final    Radiology Studies: No results found.   Scheduled Meds: . amiodarone  200 mg Oral Daily  . carvedilol  6.25 mg Oral BID WC  . chlordiazePOXIDE  25 mg Oral BID   Followed by  . [START ON 09/07/2019] chlordiazePOXIDE  25 mg Oral Daily  . Chlorhexidine Gluconate Cloth  6 each Topical Daily  . folic acid  1 mg Oral Daily  . heparin injection (subcutaneous)  5,000 Units Subcutaneous Q8H  . mouth rinse  15 mL Mouth Rinse BID  . multivitamin with minerals  1 tablet Oral Daily  . potassium chloride SA  40 mEq Oral BID  . thiamine  100 mg Oral Daily   Continuous Infusions: . sodium chloride Stopped (09/04/19 1650)  . potassium chloride Stopped (09/06/19 0938)     LOS: 6 days    Time spent: 25 mins    Javyon Fontan, MD [facility]   If 7PM-7AM, please contact night-coverage

## 2019-09-06 NOTE — Progress Notes (Signed)
Inpatient Rehabilitation Admissions Coordinator  Inpatient rehab consult received. I await further therapy treatment progress before able to assist with planning most appropriate rehab venue. I will follow up on Monday if pt remains in house.  Ottie Glazier, RN, MSN Rehab Admissions Coordinator [phone] 09/06/2019 1:31 PM

## 2019-09-06 NOTE — Progress Notes (Signed)
Pt transferred to 5M15. Report given to [facility]. Pts belonging transported with patient including cell phone, tablet, two pairs of eye glasses, and charging cord. Pts VSS, on 5L [place].

## 2019-09-06 NOTE — Progress Notes (Signed)
Physical Therapy Treatment Patient Details Name: Justin Bernard MRN: [id number] DOB: [birth year] Today's Date: 09/06/2019    History of Present Illness 59 y/o with  PMH of NICM, HFrEF, essential tremors, gait disorder, vtach/vfib with AICD and ETOH abuse who fell at home, EMS called and did not feel a pulse, ROSC with 2 minutes CPR and then concern for stiffening and posturing.  Pt was hypoxic and confused in the ED and then intubated.Acute respiratory failure secondary to acute metabolic encephalopathy.  Possible alcohol withdrawal.  Possible seizures    PT Comments    Pt was seen for mobility and control of balance standing, as well as a ck of sats and BP in orthostatic mode.  He is dropping both in standing and is feeling shaky at the knees when this happens.  Pt describes having to make repeated efforts to stand at home prior to this.  Follow up with CIR request next week, and will work toward greater tolerance for activity to support BP and increase his capacity to work on rehab unit.   On room air, BP sitting was 91/56, standing first trial 97/72 but sat before completion, sats 87%.  With cannula on, was 92/48 standing and pulse 127, sats 90% on supplemental O2.     Follow Up Recommendations  CIR     Equipment Recommendations  None recommended by PT    Recommendations for Other Services       Precautions / Restrictions Precautions Precautions: Fall Precaution Comments: watch BP and sats Restrictions Weight Bearing Restrictions: No    Mobility  Bed Mobility Overal bed mobility: Modified Independent             General bed mobility comments: on side of bed when PT Arrived  Transfers Overall transfer level: Needs assistance Equipment used: Rolling walker (2 wheeled) Transfers: Sit to/from Stand Sit to Stand: Mod assist         General transfer comment: stood with walker to ck BP and noted his was low diastolically  Ambulation/Gait              General Gait Details: deferred   Stairs             Wheelchair Mobility    Modified Rankin (Stroke Patients Only)       Balance Overall balance assessment: Needs assistance Sitting-balance support: Feet supported Sitting balance-Leahy Scale: Fair     Standing balance support: Bilateral upper extremity supported;During functional activity Standing balance-Leahy Scale: Poor Standing balance comment: notes his knees feel buckled                            Cognition Arousal/Alertness: Awake/alert Behavior During Therapy: Flat affect Overall Cognitive Status: Impaired/Different from baseline Area of Impairment: Awareness;Problem solving;Safety/judgement                       Following Commands: Follows one step commands with increased time Safety/Judgement: Decreased awareness of safety;Decreased awareness of deficits Awareness: Intellectual Problem Solving: Requires verbal cues;Slow processing General Comments: sounds more alert and interested in going to rehab but was sitting with gown open to door on side of bed      Exercises      General Comments General comments (skin integrity, edema, etc.): sitting BP was 91/56 and standing second time was 92/48, pulse 127 and sat down to 90% on cannula, first standing was 87% on room air  Pertinent Vitals/Pain Pain Assessment: No/denies pain    Home Living                      Prior Function            PT Goals (current goals can now be found in the care plan section) Acute Rehab PT Goals Patient Stated Goal: stop the dizziness Progress towards PT goals: Progressing toward goals    Frequency    Min 3X/week      PT Plan Current plan remains appropriate    Co-evaluation              AM-PAC PT "6 Clicks" Mobility   Outcome Measure  Help needed turning from your back to your side while in a flat bed without using bedrails?: None Help needed moving from lying on  your back to sitting on the side of a flat bed without using bedrails?: A Little Help needed moving to and from a bed to a chair (including a wheelchair)?: A Little Help needed standing up from a chair using your arms (e.g., wheelchair or bedside chair)?: A Lot Help needed to walk in hospital room?: A Lot Help needed climbing 3-5 steps with a railing? : A Lot 6 Click Score: 16    End of Session Equipment Utilized During Treatment: Gait belt;Oxygen Activity Tolerance: Treatment limited secondary to medical complications (Comment)(hypotension) Patient left: in bed;with call bell/phone within reach(sitting on bedside) Nurse Communication: Mobility status PT Visit Diagnosis: Other abnormalities of gait and mobility (R26.89);Difficulty in walking, not elsewhere classified (R26.2);History of falling (Z91.81)     Time: 3875-6433 PT Time Calculation (min) (ACUTE ONLY): 24 min  Charges:  $Therapeutic Activity: 23-37 mins                    Justin Bernard 09/06/2019, 2:25 PM   Justin Bernard, PT MS Acute Rehab Dept. Number: [place] and [place]

## 2019-09-06 NOTE — Progress Notes (Signed)
Occupational Therapy Treatment Patient Details Name: Justin Bernard MRN: [id number] DOB: 1960/10/02 Today's Date: 09/06/2019    History of present illness 59 y/o with  PMH of NICM, HFrEF, essential tremors, gait disorder, vtach/vfib with AICD and ETOH abuse who fell at home, EMS called and did not feel a pulse, ROSC with 2 minutes CPR and then concern for stiffening and posturing.  Pt was hypoxic and confused in the ED and then intubated.Acute respiratory failure secondary to acute metabolic encephalopathy.  Possible alcohol withdrawal.  Possible seizures   OT comments  Patient seated EOB upon entry, reports feeling better and looking into inpatient rehab to return to independence.  Session limited by symptomatic hypotension, including lightheadedness and dizziness.  Vitals below.  Tolerated minimal standing at EOB, then assisted to sidelying with min assist due to symptoms.  RN present and notified.  Will follow acutely. Updated dc plan to CIR.  EOB: BP 92/64, 94% RA, HR 90  Stand: BP 84/48, 88% RA, HR 125 Supine: BP 104/64, SpO2 94% 3L, HR 90   Follow Up Recommendations  CIR;Supervision/Assistance - 24 hour    Equipment Recommendations  None recommended by OT    Recommendations for Other Services      Precautions / Restrictions Precautions Precautions: Fall Precaution Comments: watch BP and sats Restrictions Weight Bearing Restrictions: No       Mobility Bed Mobility Overal bed mobility: Needs Assistance Bed Mobility: Sit to Sidelying         Sit to sidelying: Min assist General bed mobility comments: min assist to return to sidelying, as patient reports feeling lightheaded   Transfers Overall transfer level: Needs assistance Equipment used: Rolling walker (2 wheeled) Transfers: Sit to/from Stand Sit to Stand: Mod assist         General transfer comment: to power up and steady from EOB    Balance Overall balance assessment: Needs  assistance Sitting-balance support: Feet supported Sitting balance-Leahy Scale: Fair     Standing balance support: Bilateral upper extremity supported;During functional activity Standing balance-Leahy Scale: Poor Standing balance comment: relaint on BUE and external support                           ADL either performed or assessed with clinical judgement   ADL                                         General ADL Comments: limited session due to hypotensive     Vision       Perception     Praxis      Cognition Arousal/Alertness: Awake/alert Behavior During Therapy: Flat affect Overall Cognitive Status: Impaired/Different from baseline Area of Impairment: Awareness;Problem solving;Safety/judgement                       Following Commands: Follows one step commands with increased time Safety/Judgement: Decreased awareness of safety;Decreased awareness of deficits Awareness: Emergent Problem Solving: Requires verbal cues;Slow processing General Comments: patient with slow processing and decreased awareness; patient requires verbal cueing and mod cueing for safety         Exercises     Shoulder Instructions       General Comments vitals monitored during session     Pertinent Vitals/ Pain       Pain Assessment: No/denies pain  Home Living  Prior Functioning/Environment              Frequency  Min 2X/week        Progress Toward Goals  OT Goals(current goals can now be found in the care plan section)  Progress towards OT goals: Not progressing toward goals - comment(hypotension)  Acute Rehab OT Goals Patient Stated Goal: stop the dizziness OT Goal Formulation: With patient  Plan Discharge plan remains appropriate;Frequency remains appropriate    Co-evaluation                 AM-PAC OT "6 Clicks" Daily Activity     Outcome Measure   Help from  another person eating meals?: None Help from another person taking care of personal grooming?: A Little Help from another person toileting, which includes using toliet, bedpan, or urinal?: A Little Help from another person bathing (including washing, rinsing, drying)?: A Little Help from another person to put on and taking off regular upper body clothing?: A Little Help from another person to put on and taking off regular lower body clothing?: A Little 6 Click Score: 19    End of Session Equipment Utilized During Treatment: Gait belt;Oxygen(3L)  OT Visit Diagnosis: Unsteadiness on feet (R26.81);Other abnormalities of gait and mobility (R26.89);Muscle weakness (generalized) (M62.81);Dizziness and giddiness (R42)   Activity Tolerance Treatment limited secondary to medical complications (Comment)(hypotensive )   Patient Left in bed;with call bell/phone within reach;with bed alarm set;with nursing/sitter in room   Nurse Communication Mobility status(BP)        Time: 4010-2725 OT Time Calculation (min): 19 min  Charges: OT General Charges $OT Visit: 1 Visit OT Treatments $Therapeutic Activity: 8-22 mins  Jolaine Artist, OT [place] Pager [phone] Office [phone]    Delight Stare 09/06/2019, 2:39 PM

## 2019-09-06 NOTE — Plan of Care (Signed)
  Problem: Activity: Goal: Risk for activity intolerance will decrease Outcome: Progressing   

## 2019-09-06 NOTE — Plan of Care (Signed)
  Problem: Clinical Measurements: Goal: Respiratory complications will improve Outcome: Progressing   Problem: Clinical Measurements: Goal: Cardiovascular complication will be avoided Outcome: Progressing   Problem: Activity: Goal: Risk for activity intolerance will decrease Outcome: Progressing   Problem: Nutrition: Goal: Adequate nutrition will be maintained Outcome: Progressing   Problem: Coping: Goal: Level of anxiety will decrease Outcome: Progressing   Problem: Elimination: Goal: Will not experience complications related to bowel motility Outcome: Progressing Goal: Will not experience complications related to urinary retention Outcome: Progressing   Problem: Pain Managment: Goal: General experience of comfort will improve Outcome: Progressing   Problem: Safety: Goal: Ability to remain free from injury will improve Outcome: Progressing   Problem: Skin Integrity: Goal: Risk for impaired skin integrity will decrease Outcome: Progressing   

## 2019-09-07 LAB — CBC
HCT: 37.2 % — ABNORMAL LOW (ref 39.0–52.0)
Hemoglobin: 11.4 g/dL — ABNORMAL LOW (ref 13.0–17.0)
MCH: 31.9 pg (ref 26.0–34.0)
MCHC: 30.6 g/dL (ref 30.0–36.0)
MCV: 104.2 fL — ABNORMAL HIGH (ref 80.0–100.0)
Platelets: 193 10*3/uL (ref 150–400)
RBC: 3.57 MIL/uL — ABNORMAL LOW (ref 4.22–5.81)
RDW: 17.2 % — ABNORMAL HIGH (ref 11.5–15.5)
WBC: 4.8 10*3/uL (ref 4.0–10.5)
nRBC: 0 % (ref 0.0–0.2)

## 2019-09-07 LAB — COMPREHENSIVE METABOLIC PANEL
ALT: 31 U/L (ref 0–44)
AST: 60 U/L — ABNORMAL HIGH (ref 15–41)
Albumin: 2.5 g/dL — ABNORMAL LOW (ref 3.5–5.0)
Alkaline Phosphatase: 135 U/L — ABNORMAL HIGH (ref 38–126)
Anion gap: 10 (ref 5–15)
BUN: 9 mg/dL (ref 6–20)
CO2: 26 mmol/L (ref 22–32)
Calcium: 8.7 mg/dL — ABNORMAL LOW (ref 8.9–10.3)
Chloride: 105 mmol/L (ref 98–111)
Creatinine, Ser: 0.82 mg/dL (ref 0.61–1.24)
GFR calc Af Amer: >60 mL/min (ref >60)
GFR calc non Af Amer: >60 mL/min (ref >60)
Glucose, Bld: 114 mg/dL — ABNORMAL HIGH (ref 70–99)
Potassium: 3.9 mmol/L (ref 3.5–5.1)
Sodium: 141 mmol/L (ref 135–145)
Total Bilirubin: 0.8 mg/dL (ref 0.3–1.2)
Total Protein: 5.2 g/dL — ABNORMAL LOW (ref 6.5–8.1)

## 2019-09-07 LAB — MAGNESIUM: Magnesium: 1.7 mg/dL (ref 1.7–2.4)

## 2019-09-07 LAB — PHOSPHORUS: Phosphorus: 3.1 mg/dL (ref 2.5–4.6)

## 2019-09-07 LAB — GLUCOSE, CAPILLARY: Glucose-Capillary: 111 mg/dL — ABNORMAL HIGH (ref 70–99)

## 2019-09-07 MED ORDER — SODIUM CHLORIDE 0.9% FLUSH
3.0000 mL | Freq: Two times a day (BID) | INTRAVENOUS | Status: DC
  Administered 2019-09-07 – 2019-09-10 (×5): 3 mL via INTRAVENOUS

## 2019-09-07 MED ORDER — DIPHENHYDRAMINE HCL 25 MG PO CAPS
50.0000 mg | ORAL_CAPSULE | Freq: Four times a day (QID) | ORAL | Status: DC | PRN
  Administered 2019-09-08 – 2019-09-10 (×5): 50 mg via ORAL
  Filled 2019-09-07 (×5): qty 2

## 2019-09-07 MED ORDER — ACETAMINOPHEN 325 MG PO TABS
650.0000 mg | ORAL_TABLET | Freq: Four times a day (QID) | ORAL | Status: DC | PRN
  Administered 2019-09-07 – 2019-09-10 (×5): 650 mg via ORAL
  Filled 2019-09-07 (×5): qty 2

## 2019-09-07 MED ORDER — IBUPROFEN 400 MG PO TABS
400.0000 mg | ORAL_TABLET | Freq: Four times a day (QID) | ORAL | Status: DC | PRN
  Administered 2019-09-10: 400 mg via ORAL
  Filled 2019-09-07: qty 1

## 2019-09-07 MED ORDER — SODIUM CHLORIDE 0.9% FLUSH: 3.0000 mL | INTRAVENOUS | Status: DC | PRN

## 2019-09-07 NOTE — Progress Notes (Signed)
[facility] notified patient has headache no medication ordered states 5/10 pain score.Ilean Skill

## 2019-09-07 NOTE — Plan of Care (Signed)

## 2019-09-07 NOTE — Progress Notes (Signed)
Physical Therapy Treatment Patient Details Name: Justin Bernard MRN: [id number] DOB: [birth year] Today's Date: 09/07/2019    History of Present Illness 59 y/o with  PMH of NICM, HFrEF, essential tremors, gait disorder, vtach/vfib with AICD and ETOH abuse who fell at home, EMS called and did not feel a pulse, ROSC with 2 minutes CPR and then concern for stiffening and posturing.  Pt was hypoxic and confused in the ED and then intubated.Acute respiratory failure secondary to acute metabolic encephalopathy.  Possible alcohol withdrawal.  Possible seizures    PT Comments     Patient did better today although he was still limited. He increased his gait distance to 12 feet but he was fatigued after. His Sao2 dropped. He had his o2 off when therapy arrived. He walked without it but de-sated quickly. He came back up quickly with o2 on. He is very motivated to go home but is aware of his endurance deficits and that he will likely need rehab. Acute therapy will continue to work with him on endurance.    Follow Up Recommendations   SNF V home      Equipment Recommendations       Recommendations for Other Services       Precautions / Restrictions Precautions Precautions: Fall Precaution Comments: watch BP and sats Restrictions Weight Bearing Restrictions: No    Mobility  Bed Mobility               General bed mobility comments: patient found sitting eob   Transfers Overall transfer level: Needs assistance Equipment used: Rolling walker (2 wheeled) Transfers: Sit to/from Stand Sit to Stand: Min guard         General transfer comment: improved balance today. Still has mild synciope but no LOB   Ambulation/Gait Ambulation/Gait assistance: Min guard Gait Distance (Feet): 12 Feet     Gait velocity: decreased   General Gait Details: B/P after gait still at 94/52 which was close to his baseline. Sao2 dropped to 79 %. on room air. O2 placed back on and quickly rose to  92%. patient advised to keep his O2 on. He freuwnlty removes it.    Stairs             Wheelchair Mobility    Modified Rankin (Stroke Patients Only)       Balance Overall balance assessment: Needs assistance Sitting-balance support: Feet supported Sitting balance-Leahy Scale: Fair     Standing balance support: Bilateral upper extremity supported;During functional activity Standing balance-Leahy Scale: Poor Standing balance comment: relaint on BUE and external support                            Cognition Arousal/Alertness: Awake/alert Behavior During Therapy: Flat affect Overall Cognitive Status: Impaired/Different from baseline Area of Impairment: Awareness;Problem solving;Safety/judgement                 Orientation Level: Time;Disoriented to;Situation     Following Commands: Follows one step commands with increased time Safety/Judgement: Decreased awareness of safety;Decreased awareness of deficits Awareness: Emergent Problem Solving: Requires verbal cues;Slow processing General Comments: patient with slow processing and decreased awareness; patient requires verbal cueing and mod cueing for safety but improved today       Exercises General Exercises - Lower Extremity Ankle Circles/Pumps: 15 reps Long Arc Quad: 15 reps Heel Slides: 15 reps Hip Flexion/Marching: 15 reps    General Comments        Pertinent Vitals/Pain Pain  Assessment: No/denies pain    Home Living                      Prior Function            PT Goals (current goals can now be found in the care plan section) Acute Rehab PT Goals Patient Stated Goal: stop the dizziness PT Goal Formulation: With patient Time For Goal Achievement: 09/16/19 Potential to Achieve Goals: Good Progress towards PT goals: Progressing toward goals    Frequency    Min 3X/week      PT Plan Current plan remains appropriate    Co-evaluation              AM-PAC PT  "6 Clicks" Mobility   Outcome Measure  Help needed turning from your back to your side while in a flat bed without using bedrails?: None Help needed moving from lying on your back to sitting on the side of a flat bed without using bedrails?: A Little Help needed moving to and from a bed to a chair (including a wheelchair)?: A Little Help needed standing up from a chair using your arms (e.g., wheelchair or bedside chair)?: A Little Help needed to walk in hospital room?: A Little Help needed climbing 3-5 steps with a railing? : A Lot 6 Click Score: 18    End of Session Equipment Utilized During Treatment: Gait belt;Oxygen Activity Tolerance: Treatment limited secondary to medical complications (Comment) Patient left: in bed;with call bell/phone within reach Nurse Communication: Mobility status PT Visit Diagnosis: Other abnormalities of gait and mobility (R26.89);Difficulty in walking, not elsewhere classified (R26.2);History of falling (Z91.81)     Time: 1250-1320 PT Time Calculation (min) (ACUTE ONLY): 30 min  Charges:  $Gait Training: 8-22 mins $Therapeutic Exercise: 8-22 mins                      Carney Living PT DPT  09/07/2019, 3:37 PM

## 2019-09-07 NOTE — Progress Notes (Signed)
PROGRESS NOTE    Justin Bernard  IHW:[id number] DOB: 02-27-1961 DOA: 08/30/2019 PCP: Jarrett Soho, PA-C   Brief Narrative:  59 y.o.M with PMH of NICM, HFrEF, vtach/vfib with AICD and ETOH abuse who presents with a fall out of a chair at home, per report fall from standing. When EMS arrived, there was concern for loss of pulses and about 2 minutes of CPR performed with ROSC and then stiffening and posturing concerning for seizure. Pt in sinus rhythm on arrival to ED and awake though confused. Became increasingly agitated despite Ativan. He was hypoxic into 70%'s requiring facemask oxygen. Head CT was negative and pt was intubated for hypoxia and agitation. He was able to be successfully extubated after a single day 1/16-1/17. He was stabilized and [facility] via [place].  Overnight (early am) 1/19 he was subsequently transferred back to the ICU after the patient was reported to have difficulty breathing and was placed on a venturi mask 11L without improvement. Rapid response was calledapproximately 1600 1/19due to patient being obtunded with use of accessory muscles noted. Patient transferred back to ICU on 1/19 for management of combined acute hypoxemic and hypercarbic respiratory failure.  He successfully extubated.  He was requiring  5L supplemental oxygen,saturating 94%. BIPAP at night and PRN.   Assessment & Plan:   Active Problems:   Alcohol withdrawal syndrome, with delirium (HCC)   Syncope   Encephalopathy   Acute respiratory failure (HCC)  Acute hypoxemic and hypercapnic respiratory failure -Improving  Thought to be secondary to pulmonary edema and HFrEF exacerbation with LVEF of 40-45%. BNP minimally elevated to 667. Patient continues to be afebrile without a leukocytosis.  Patient put out 4L after receiving IV lasix 80mg  on 1/19 and another 4L on 1/20 with improvement in his symptoms.  -He was given lasix 80mg  yesterday. -Increase  carvedilol 6.25mg  bid  -Continue amiodarone 200mg  qd  -Continue monitoring I/Os and daily weights  -Trend daily bmp's  Acute metabolic encephalopathy  - Improving Alcohol withdrawal Likely due to acute hypoxemic and hypercapnic respiratory failure with possible alcohol withdrawal although he should be out of the window for this by now. Patient required 3 doses of ativan during the day 1/19 but needed to be switched to precedex at one point. He is now OFF precedex. Plan -Discontinue precedex from system. -Continue librium taper at 25mg  TID today followed by 25mg  BID tomorrow and 25mg  once on day three  Hypomagnesemia  Hypophosphatemia  Likely 2/2 to poor baseline nutrition and large volume diuresis.  Plan -Monitor potassium, mag and phos daily -Replete as indicated  Concern for Right Shoulder Osteomyelitis  Right shoulder xray showing chronic changes without acute abnormality. Discontinued cefazolin 1/17.   Alcohol Use Disorder  Last use Friday 1/15. He states he usually drinks 1-2 ounces of bourbon, other hard liquour and beer daily. He was hospitalized for alcohol withdrawal previously in 2015.  Continue -Thiamine  -Golic acid  -Multivitamins  Acute Kidney Injury Resolved.  HFrEF TTE shwoing 40-45% ef, ivc normal in size, left ventricle global hypokinesis.     DVT prophylaxis: Heparin sq Code Status: Full Family Communication: No one at bed side. Disposition Plan:  Inpatient rehab once medically cleared. Consultants:   PCCM  Procedures: Antimicrobials: Anti-infectives (From admission, onward)   Start     Dose/Rate Route Frequency Ordered Stop   08/31/19 0030  ceFAZolin (ANCEF) IVPB 2g/100 mL premix  Status:  Discontinued     2 g 200 mL/hr over 30  Minutes Intravenous Every 8 hours 08/31/19 0028 09/01/19 1417   08/31/19 0015  vancomycin (VANCOREADY) IVPB 2000 mg/400 mL     2,000 mg 200 mL/hr over 120 Minutes Intravenous  Once 08/31/19 0003 08/31/19 0311    08/31/19 0015  ceFEPIme (MAXIPIME) 2 g in sodium chloride 0.9 % 100 mL IVPB  Status:  Discontinued     2 g 200 mL/hr over 30 Minutes Intravenous  Once 08/31/19 0003 08/31/19 0028      Subjective: Patient was seen and examined at bedside,  He appears more comfortable, Denies any chest pain or other concerns.  No overnight events.  Objective: Vitals:   09/06/19 2044 09/07/19 0200 09/07/19 0524 09/07/19 0932  BP: 112/72 118/77 112/65 93/67  Pulse: 87 91 92 86  Resp: 18  16 18   Temp:   98.2 F (36.8 C) 98 F (36.7 C)  TempSrc:   Oral Oral  SpO2: 94%  93% 99%  Weight:      Height:        Intake/Output Summary (Last 24 hours) at 09/07/2019 1425 Last data filed at 09/07/2019 1300 Gross per 24 hour  Intake 588.46 ml  Output 800 ml  Net -211.54 ml   Filed Weights   09/03/19 1800 09/04/19 0500 09/05/19 0400  Weight: 98.7 kg 108.2 kg 109.2 kg    Examination:  General exam: Appears calm and comfortable  Respiratory system: Clear to auscultation. Respiratory effort normal. Cardiovascular system: S1 & S2 heard, RRR. No JVD, murmurs, rubs, gallops or clicks. No pedal edema. Gastrointestinal system: Abdomen is nondistended, soft and nontender. No organomegaly or masses felt. Normal bowel sounds heard. Central nervous system: Alert and oriented. No focal neurological deficits. Extremities: No swelling, No edema. Skin: No rashes, lesions or ulcers Psychiatry: Judgement and insight appear normal. Mood & affect appropriate.    Data Reviewed: I have personally reviewed following labs and imaging studies  CBC: Recent Labs  Lab 09/02/19 0907 09/02/19 0907 09/03/19 0542 09/03/19 1912 09/03/19 2019 09/04/19 0729 09/07/19 0620  WBC 6.3  --  6.7 6.5  --  3.7* 4.8  HGB 12.3*   < > 11.7* 13.8 13.9 11.0* 11.4*  HCT 41.7   < > 40.0 47.9 41.0 36.7* 37.2*  MCV 106.6*  --  107.2* 109.1*  --  105.8* 104.2*  PLT 141*  --  193 159  --  129* 193   < > = values in this interval not displayed.    Basic Metabolic Panel: Recent Labs  Lab 09/03/19 0542 09/03/19 1912 09/04/19 0235 09/04/19 0729 09/04/19 1431 09/05/19 0237 09/05/19 0822 09/06/19 0420 09/07/19 0620  NA 143   < >  --  144 143  --  144 140 141  K 3.8   < >  --  3.2* 3.5  --  3.0* 2.9* 3.9  CL 114*   < >  --  109 107  --  105 100 105  CO2 21*   < >  --  26 26  --  26 29 26   GLUCOSE 137*   < >  --  139* 113*  --  131* 124* 114*  BUN 12   < >  --  11 14  --  12 10 9   CREATININE 1.02   < >  --  0.99 1.06  --  0.94 0.84 0.82  CALCIUM 8.1*   < >  --  8.1* 8.7*  --  8.5* 8.4* 8.7*  MG 1.6*  --  1.4*  --   --  1.8  --  1.8 1.7  PHOS 3.3  --  2.7  --   --  1.5*  --  2.5 3.1   < > = values in this interval not displayed.   GFR: Estimated Creatinine Clearance: 134.7 mL/min (by C-G formula based on SCr of 0.82 mg/dL). Liver Function Tests: Recent Labs  Lab 09/01/19 1422 09/02/19 0907 09/07/19 0620  AST 89* 90* 60*  ALT 31 30 31   ALKPHOS 145* 176* 135*  BILITOT 1.1 1.3* 0.8  PROT 5.2* 5.9* 5.2*  ALBUMIN 2.6* 2.9* 2.5*   No results for input(s): LIPASE, AMYLASE in the last 168 hours. No results for input(s): AMMONIA in the last 168 hours. Coagulation Profile: No results for input(s): INR, PROTIME in the last 168 hours. Cardiac Enzymes: No results for input(s): CKTOTAL, CKMB, CKMBINDEX, TROPONINI in the last 168 hours. BNP (last 3 results) No results for input(s): PROBNP in the last 8760 hours. HbA1C: No results for input(s): HGBA1C in the last 72 hours. CBG: Recent Labs  Lab 09/04/19 1149 09/04/19 1541 09/04/19 1954 09/05/19 0746 09/07/19 0657  GLUCAP 110* 98 128* 99 111*   Lipid Profile: No results for input(s): CHOL, HDL, LDLCALC, TRIG, CHOLHDL, LDLDIRECT in the last 72 hours. Thyroid Function Tests: No results for input(s): TSH, T4TOTAL, FREET4, T3FREE, THYROIDAB in the last 72 hours. Anemia Panel: No results for input(s): VITAMINB12, FOLATE, FERRITIN, TIBC, IRON, RETICCTPCT in the last 72  hours. Sepsis Labs: Recent Labs  Lab 09/01/19 0249 09/02/19 0328  PROCALCITON 0.12 0.16    Recent Results (from the past 240 hour(s))  Respiratory Panel by RT PCR (Flu A&B, Covid) - Nasopharyngeal Swab     Status: None   Collection Time: 08/30/19  9:10 PM   Specimen: Nasopharyngeal Swab  Result Value Ref Range Status   SARS Coronavirus 2 by RT PCR NEGATIVE NEGATIVE Final    Comment: (NOTE) SARS-CoV-2 target nucleic acids are NOT DETECTED. The SARS-CoV-2 RNA is generally detectable in upper respiratoy specimens during the acute phase of infection. The lowest concentration of SARS-CoV-2 viral copies this assay can detect is 131 copies/mL. A negative result does not preclude SARS-Cov-2 infection and should not be used as the sole basis for treatment or other patient management decisions. A negative result may occur with  improper specimen collection/handling, submission of specimen other than nasopharyngeal swab, presence of viral mutation(s) within the areas targeted by this assay, and inadequate number of viral copies (<131 copies/mL). A negative result must be combined with clinical observations, patient history, and epidemiological information. The expected result is Negative. Fact Sheet for Patients:  PinkCheek.be Fact Sheet for Healthcare Providers:  GravelBags.it This test is not yet ap proved or cleared by the [place] FDA and  has been authorized for detection and/or diagnosis of SARS-CoV-2 by FDA under an Emergency Use Authorization (EUA). This EUA will remain  in effect (meaning this test can be used) for the duration of the COVID-19 declaration under Section 564(b)(1) of the Act, 21 U.S.C. section 360bbb-3(b)(1), unless the authorization is terminated or revoked sooner.    Influenza A by PCR NEGATIVE NEGATIVE Final   Influenza B by PCR NEGATIVE NEGATIVE Final    Comment: (NOTE) The Xpert Xpress  SARS-CoV-2/FLU/RSV assay is intended as an aid in  the diagnosis of influenza from Nasopharyngeal swab specimens and  should not be used as a sole basis for treatment. Nasal washings and  aspirates are unacceptable for Xpert Xpress SARS-CoV-2/FLU/RSV  testing. Fact Sheet for Patients: PinkCheek.be  Fact Sheet for Healthcare Providers: https://www.young.biz/ This test is not yet approved or cleared by the [place] FDA and  has been authorized for detection and/or diagnosis of SARS-CoV-2 by  FDA under an Emergency Use Authorization (EUA). This EUA will remain  in effect (meaning this test can be used) for the duration of the  Covid-19 declaration under Section 564(b)(1) of the Act, 21  U.S.C. section 360bbb-3(b)(1), unless the authorization is  terminated or revoked. Performed at [facility], [address], [place], [place] [address]   Urine culture     Status: None   Collection Time: 08/31/19  1:48 AM   Specimen: Urine, Random  Result Value Ref Range Status   Specimen Description URINE, RANDOM  Final   Special Requests NONE  Final   Culture   Final    NO GROWTH Performed at [facility], [address], [place], [place] [address]    Report Status 09/01/2019 FINAL  Final  Culture, blood (routine x 2)     Status: None   Collection Time: 08/31/19  2:18 AM   Specimen: BLOOD  Result Value Ref Range Status   Specimen Description BLOOD LEFT HAND  Final   Special Requests   Final    BOTTLES DRAWN AEROBIC ONLY Blood Culture results may not be optimal due to an inadequate volume of blood received in culture bottles   Culture   Final    NO GROWTH 5 DAYS Performed at [facility], [address] [place], [place] [address]    Report Status 09/05/2019 FINAL  Final  Culture, blood (routine x 2)     Status: None   Collection Time: 08/31/19  2:38 AM   Specimen: BLOOD  Result Value Ref Range Status   Specimen  Description BLOOD LEFT HAND  Final   Special Requests   Final    BOTTLES DRAWN AEROBIC ONLY Blood Culture results may not be optimal due to an inadequate volume of blood received in culture bottles   Culture   Final    NO GROWTH 5 DAYS Performed at [facility], [address], [place], [place] [address]    Report Status 09/05/2019 FINAL  Final    Radiology Studies: No results found.   Scheduled Meds: . amiodarone  200 mg Oral Daily  . carvedilol  6.25 mg Oral BID WC  . folic acid  1 mg Oral Daily  . heparin injection (subcutaneous)  5,000 Units Subcutaneous Q8H  . multivitamin with minerals  1 tablet Oral Daily  . sodium chloride flush  3 mL Intravenous Q12H  . thiamine  100 mg Oral Daily   Continuous Infusions: . sodium chloride Stopped (09/04/19 1650)     LOS: 7 days    Time spent: 25 mins    Smantha Boakye, MD [facility]   If 7PM-7AM, please contact night-coverage

## 2019-09-08 LAB — CBC
HCT: 38.4 % — ABNORMAL LOW (ref 39.0–52.0)
Hemoglobin: 12 g/dL — ABNORMAL LOW (ref 13.0–17.0)
MCH: 31.7 pg (ref 26.0–34.0)
MCHC: 31.3 g/dL (ref 30.0–36.0)
MCV: 101.6 fL — ABNORMAL HIGH (ref 80.0–100.0)
Platelets: 179 10*3/uL (ref 150–400)
RBC: 3.78 MIL/uL — ABNORMAL LOW (ref 4.22–5.81)
RDW: 17 % — ABNORMAL HIGH (ref 11.5–15.5)
WBC: 6.5 10*3/uL (ref 4.0–10.5)
nRBC: 0 % (ref 0.0–0.2)

## 2019-09-08 LAB — COMPREHENSIVE METABOLIC PANEL
ALT: 32 U/L (ref 0–44)
AST: 64 U/L — ABNORMAL HIGH (ref 15–41)
Albumin: 2.7 g/dL — ABNORMAL LOW (ref 3.5–5.0)
Alkaline Phosphatase: 136 U/L — ABNORMAL HIGH (ref 38–126)
Anion gap: 10 (ref 5–15)
BUN: 11 mg/dL (ref 6–20)
CO2: 26 mmol/L (ref 22–32)
Calcium: 8.8 mg/dL — ABNORMAL LOW (ref 8.9–10.3)
Chloride: 101 mmol/L (ref 98–111)
Creatinine, Ser: 0.81 mg/dL (ref 0.61–1.24)
GFR calc Af Amer: >60 mL/min (ref >60)
GFR calc non Af Amer: >60 mL/min (ref >60)
Glucose, Bld: 124 mg/dL — ABNORMAL HIGH (ref 70–99)
Potassium: 4.3 mmol/L (ref 3.5–5.1)
Sodium: 137 mmol/L (ref 135–145)
Total Bilirubin: 0.9 mg/dL (ref 0.3–1.2)
Total Protein: 5.4 g/dL — ABNORMAL LOW (ref 6.5–8.1)

## 2019-09-08 LAB — PHOSPHORUS: Phosphorus: 2.9 mg/dL (ref 2.5–4.6)

## 2019-09-08 LAB — MAGNESIUM: Magnesium: 1.7 mg/dL (ref 1.7–2.4)

## 2019-09-08 NOTE — Progress Notes (Signed)
PROGRESS NOTE    Justin Bernard  HFW:[id number] DOB: 02-05-1961 DOA: 08/30/2019 PCP: Jarrett Soho, PA-C   Brief Narrative:  59 y.o.M with PMH of NICM, HFrEF, vtach/vfib with AICD and ETOH abuse who presents with a fall out of a chair at home, per report fall from standing. When EMS arrived, there was concern for loss of pulses and about 2 minutes of CPR performed with ROSC and then stiffening and posturing concerning for seizure. Pt in sinus rhythm on arrival to ED and awake though confused. Became increasingly agitated despite Ativan. He was hypoxic into 70%'s requiring facemask oxygen. Head CT was negative and pt was intubated for hypoxia and agitation. He was able to be successfully extubated after a single day 1/16-1/17. He was stabilized and transferred to triad hospitalist serviceon 2-3L via [place].  Overnight (early am) 1/19 he was subsequently transferred back to the ICU after the patient was reported to have difficulty breathing and was placed on a venturi mask 11L without improvement. Rapid response was calledapproximately 1600 1/19due to patient being obtunded with use of accessory muscles noted. Patient transferred back to ICU on 1/19 for management of combined acute hypoxemic and hypercarbic respiratory failure.  He is successfully extubated.  He is requiring 2L supplemental oxygen,saturating 94%. BIPAP at night and PRN.   Assessment & Plan:   Active Problems:   Alcohol withdrawal syndrome, with delirium (HCC)   Syncope   Encephalopathy   Acute respiratory failure (HCC)  Acute hypoxemic and hypercapnic respiratory failure -Improving  Thought to be secondary to pulmonary edema and HFrEF exacerbation with LVEF of 40-45%. BNP minimally elevated to 667. Patient continues to be afebrile without a leukocytosis.  Patient put out 4L after receiving IV lasix 80mg  on 1/19 and another 4L on 1/20 with improvement in his symptoms.  -He was given lasix 80mg   yesterday. -Increase carvedilol 6.25mg  bid  -Continue amiodarone 200mg  qd  -Continue monitoring I/Os and daily weights  -Trend daily bmp's  Acute metabolic encephalopathy  - Improving Alcohol withdrawal Likely due to acute hypoxemic and hypercapnic respiratory failure with possible alcohol withdrawal although he should be out of the window for this by now. Patient required 3 doses of ativan during the day 1/19 but needed to be switched to precedex at one point. He is now OFF precedex. Plan -Discontinued precedex from system 1/22 -Continue librium taper at 25mg  TID today followed by 25mg  BID tomorrow and 25mg  once on day three  Hypomagnesemia - resolved Hypophosphatemia  Likely 2/2 to poor baseline nutrition and large volume diuresis.  Plan -Monitor potassium, mag and phos daily -Replete as indicated  Concern for Right Shoulder Osteomyelitis  Right shoulder xray showing chronic changes without acute abnormality. Discontinued cefazolin 1/17.   Alcohol Use Disorder  Last use Friday 1/15. He states he usually drinks 1-2 ounces of bourbon, other hard liquour and beer daily. He was hospitalized for alcohol withdrawal previously in 2015.  Continue -Thiamine  -Golic acid  -Multivitamins  Acute Kidney Injury Resolved.  HFrEF TTE shwoing 40-45% ef, ivc normal in size, left ventricle global hypokinesis.     DVT prophylaxis: Heparin sq Code Status: Full Family Communication: No one at bed side. Disposition Plan:  Inpatient rehab once medically cleared. Patient has poor endurance, gets short of breath with exertion. Consultants:   PCCM  Procedures: Antimicrobials: Anti-infectives (From admission, onward)   Start     Dose/Rate Route Frequency Ordered Stop   08/31/19 0030  ceFAZolin (ANCEF) IVPB 2g/100 mL premix  Status:  Discontinued     2 g 200 mL/hr over 30 Minutes Intravenous Every 8 hours 08/31/19 0028 09/01/19 1417   08/31/19 0015  vancomycin (VANCOREADY) IVPB 2000  mg/400 mL     2,000 mg 200 mL/hr over 120 Minutes Intravenous  Once 08/31/19 0003 08/31/19 0311   08/31/19 0015  ceFEPIme (MAXIPIME) 2 g in sodium chloride 0.9 % 100 mL IVPB  Status:  Discontinued     2 g 200 mL/hr over 30 Minutes Intravenous  Once 08/31/19 0003 08/31/19 0028      Subjective: Patient was seen and examined at bedside,  He appears more comfortable, Denies any chest pain or other concerns.  No overnight events.  Objective: Vitals:   09/07/19 2135 09/08/19 0458 09/08/19 0500 09/08/19 0850  BP:  115/72  105/86  Pulse:  78  95  Resp:  16  18  Temp:  98.2 F (36.8 C)  97.8 F (36.6 C)  TempSrc:  Oral  Oral  SpO2: 90% 93%  97%  Weight:   110.9 kg   Height:        Intake/Output Summary (Last 24 hours) at 09/08/2019 1335 Last data filed at 09/08/2019 0900 Gross per 24 hour  Intake 820 ml  Output 1650 ml  Net -830 ml   Filed Weights   09/05/19 0400 09/07/19 0030 09/08/19 0500  Weight: 109.2 kg 111.1 kg 110.9 kg    Examination:  General exam: Appears calm and comfortable, on 2L supplemental O2 Respiratory system: Clear to auscultation. Respiratory effort normal. Cardiovascular system: S1 & S2 heard, RRR. No JVD, murmurs, rubs, gallops or clicks. No pedal edema. Gastrointestinal system: Abdomen is nondistended, soft and nontender. No organomegaly or masses felt. Normal bowel sounds heard. Central nervous system: Alert and oriented. No focal neurological deficits. Extremities: No swelling, No edema. Skin: No rashes, lesions or ulcers Psychiatry: Judgement and insight appear normal. Mood & affect appropriate.    Data Reviewed: I have personally reviewed following labs and imaging studies  CBC: Recent Labs  Lab 09/03/19 0542 09/03/19 0542 09/03/19 1912 09/03/19 2019 09/04/19 0729 09/07/19 0620 09/08/19 0451  WBC 6.7  --  6.5  --  3.7* 4.8 6.5  HGB 11.7*   < > 13.8 13.9 11.0* 11.4* 12.0*  HCT 40.0   < > 47.9 41.0 36.7* 37.2* 38.4*  MCV 107.2*  --   109.1*  --  105.8* 104.2* 101.6*  PLT 193  --  159  --  129* 193 179   < > = values in this interval not displayed.   Basic Metabolic Panel: Recent Labs  Lab 09/04/19 0235 09/04/19 0729 09/04/19 1431 09/05/19 0237 09/05/19 0822 09/06/19 0420 09/07/19 0620 09/08/19 0451  NA  --    < > 143  --  144 140 141 137  K  --    < > 3.5  --  3.0* 2.9* 3.9 4.3  CL  --    < > 107  --  105 100 105 101  CO2  --    < > 26  --  26 29 26 26   GLUCOSE  --    < > 113*  --  131* 124* 114* 124*  BUN  --    < > 14  --  12 10 9 11   CREATININE  --    < > 1.06  --  0.94 0.84 0.82 0.81  CALCIUM  --    < > 8.7*  --  8.5* 8.4* 8.7* 8.8*  MG 1.4*  --   --  1.8  --  1.8 1.7 1.7  PHOS 2.7  --   --  1.5*  --  2.5 3.1 2.9   < > = values in this interval not displayed.   GFR: Estimated Creatinine Clearance: 137.2 mL/min (by C-G formula based on SCr of 0.81 mg/dL). Liver Function Tests: Recent Labs  Lab 09/01/19 1422 09/02/19 0907 09/07/19 0620 09/08/19 0451  AST 89* 90* 60* 64*  ALT 31 30 31  32  ALKPHOS 145* 176* 135* 136*  BILITOT 1.1 1.3* 0.8 0.9  PROT 5.2* 5.9* 5.2* 5.4*  ALBUMIN 2.6* 2.9* 2.5* 2.7*   No results for input(s): LIPASE, AMYLASE in the last 168 hours. No results for input(s): AMMONIA in the last 168 hours. Coagulation Profile: No results for input(s): INR, PROTIME in the last 168 hours. Cardiac Enzymes: No results for input(s): CKTOTAL, CKMB, CKMBINDEX, TROPONINI in the last 168 hours. BNP (last 3 results) No results for input(s): PROBNP in the last 8760 hours. HbA1C: No results for input(s): HGBA1C in the last 72 hours. CBG: Recent Labs  Lab 09/04/19 1149 09/04/19 1541 09/04/19 1954 09/05/19 0746 09/07/19 0657  GLUCAP 110* 98 128* 99 111*   Lipid Profile: No results for input(s): CHOL, HDL, LDLCALC, TRIG, CHOLHDL, LDLDIRECT in the last 72 hours. Thyroid Function Tests: No results for input(s): TSH, T4TOTAL, FREET4, T3FREE, THYROIDAB in the last 72 hours. Anemia  Panel: No results for input(s): VITAMINB12, FOLATE, FERRITIN, TIBC, IRON, RETICCTPCT in the last 72 hours. Sepsis Labs: Recent Labs  Lab 09/02/19 0328  PROCALCITON 0.16    Recent Results (from the past 240 hour(s))  Respiratory Panel by RT PCR (Flu A&B, Covid) - Nasopharyngeal Swab     Status: None   Collection Time: 08/30/19  9:10 PM   Specimen: Nasopharyngeal Swab  Result Value Ref Range Status   SARS Coronavirus 2 by RT PCR NEGATIVE NEGATIVE Final    Comment: (NOTE) SARS-CoV-2 target nucleic acids are NOT DETECTED. The SARS-CoV-2 RNA is generally detectable in upper respiratoy specimens during the acute phase of infection. The lowest concentration of SARS-CoV-2 viral copies this assay can detect is 131 copies/mL. A negative result does not preclude SARS-Cov-2 infection and should not be used as the sole basis for treatment or other patient management decisions. A negative result may occur with  improper specimen collection/handling, submission of specimen other than nasopharyngeal swab, presence of viral mutation(s) within the areas targeted by this assay, and inadequate number of viral copies (<131 copies/mL). A negative result must be combined with clinical observations, patient history, and epidemiological information. The expected result is Negative. Fact Sheet for Patients:  PinkCheek.be Fact Sheet for Healthcare Providers:  GravelBags.it This test is not yet ap proved or cleared by the [place] FDA and  has been authorized for detection and/or diagnosis of SARS-CoV-2 by FDA under an Emergency Use Authorization (EUA). This EUA will remain  in effect (meaning this test can be used) for the duration of the COVID-19 declaration under Section 564(b)(1) of the Act, 21 U.S.C. section 360bbb-3(b)(1), unless the authorization is terminated or revoked sooner.    Influenza A by PCR NEGATIVE NEGATIVE Final    Influenza B by PCR NEGATIVE NEGATIVE Final    Comment: (NOTE) The Xpert Xpress SARS-CoV-2/FLU/RSV assay is intended as an aid in  the diagnosis of influenza from Nasopharyngeal swab specimens and  should not be used as a sole basis for treatment. Nasal washings and  aspirates are unacceptable for Xpert Xpress SARS-CoV-2/FLU/RSV  testing. Fact Sheet  for Patients: https://www.moore.com/ Fact Sheet for Healthcare Providers: https://www.young.biz/ This test is not yet approved or cleared by the [place] FDA and  has been authorized for detection and/or diagnosis of SARS-CoV-2 by  FDA under an Emergency Use Authorization (EUA). This EUA will remain  in effect (meaning this test can be used) for the duration of the  Covid-19 declaration under Section 564(b)(1) of the Act, 21  U.S.C. section 360bbb-3(b)(1), unless the authorization is  terminated or revoked. Performed at [facility], [address], [place], [place] [address]   Urine culture     Status: None   Collection Time: 08/31/19  1:48 AM   Specimen: Urine, Random  Result Value Ref Range Status   Specimen Description URINE, RANDOM  Final   Special Requests NONE  Final   Culture   Final    NO GROWTH Performed at [facility], [address], [place], [place] [address]    Report Status 09/01/2019 FINAL  Final  Culture, blood (routine x 2)     Status: None   Collection Time: 08/31/19  2:18 AM   Specimen: BLOOD  Result Value Ref Range Status   Specimen Description BLOOD LEFT HAND  Final   Special Requests   Final    BOTTLES DRAWN AEROBIC ONLY Blood Culture results may not be optimal due to an inadequate volume of blood received in culture bottles   Culture   Final    NO GROWTH 5 DAYS Performed at [facility], [address], [place], [place] [address]    Report Status 09/05/2019 FINAL  Final  Culture, blood (routine x 2)     Status: None   Collection Time: 08/31/19   2:38 AM   Specimen: BLOOD  Result Value Ref Range Status   Specimen Description BLOOD LEFT HAND  Final   Special Requests   Final    BOTTLES DRAWN AEROBIC ONLY Blood Culture results may not be optimal due to an inadequate volume of blood received in culture bottles   Culture   Final    NO GROWTH 5 DAYS Performed at [facility], [address], [place], [place] [address]    Report Status 09/05/2019 FINAL  Final    Radiology Studies: No results found.   Scheduled Meds: . amiodarone  200 mg Oral Daily  . carvedilol  6.25 mg Oral BID WC  . folic acid  1 mg Oral Daily  . heparin injection (subcutaneous)  5,000 Units Subcutaneous Q8H  . multivitamin with minerals  1 tablet Oral Daily  . sodium chloride flush  3 mL Intravenous Q12H  . thiamine  100 mg Oral Daily   Continuous Infusions: . sodium chloride Stopped (09/04/19 1650)     LOS: 8 days    Time spent: 25 mins    Katalina Magri, MD [facility]   If 7PM-7AM, please contact night-coverage

## 2019-09-09 LAB — CBC
HCT: 40.4 % (ref 39.0–52.0)
Hemoglobin: 12.5 g/dL — ABNORMAL LOW (ref 13.0–17.0)
MCH: 31.6 pg (ref 26.0–34.0)
MCHC: 30.9 g/dL (ref 30.0–36.0)
MCV: 102 fL — ABNORMAL HIGH (ref 80.0–100.0)
Platelets: 268 10*3/uL (ref 150–400)
RBC: 3.96 MIL/uL — ABNORMAL LOW (ref 4.22–5.81)
RDW: 16.7 % — ABNORMAL HIGH (ref 11.5–15.5)
WBC: 6.7 10*3/uL (ref 4.0–10.5)
nRBC: 0 % (ref 0.0–0.2)

## 2019-09-09 LAB — COMPREHENSIVE METABOLIC PANEL
ALT: 36 U/L (ref 0–44)
AST: 64 U/L — ABNORMAL HIGH (ref 15–41)
Albumin: 2.8 g/dL — ABNORMAL LOW (ref 3.5–5.0)
Alkaline Phosphatase: 139 U/L — ABNORMAL HIGH (ref 38–126)
Anion gap: 10 (ref 5–15)
BUN: 10 mg/dL (ref 6–20)
CO2: 27 mmol/L (ref 22–32)
Calcium: 9.3 mg/dL (ref 8.9–10.3)
Chloride: 103 mmol/L (ref 98–111)
Creatinine, Ser: 0.8 mg/dL (ref 0.61–1.24)
GFR calc Af Amer: >60 mL/min (ref >60)
GFR calc non Af Amer: >60 mL/min (ref >60)
Glucose, Bld: 129 mg/dL — ABNORMAL HIGH (ref 70–99)
Potassium: 3.7 mmol/L (ref 3.5–5.1)
Sodium: 140 mmol/L (ref 135–145)
Total Bilirubin: 0.8 mg/dL (ref 0.3–1.2)
Total Protein: 5.9 g/dL — ABNORMAL LOW (ref 6.5–8.1)

## 2019-09-09 MED ORDER — FUROSEMIDE 10 MG/ML IJ SOLN
40.0000 mg | Freq: Two times a day (BID) | INTRAMUSCULAR | Status: DC
  Administered 2019-09-09 – 2019-09-10 (×2): 40 mg via INTRAVENOUS
  Filled 2019-09-09 (×2): qty 4

## 2019-09-09 MED ORDER — MELATONIN 3 MG PO TABS
6.0000 mg | ORAL_TABLET | Freq: Every evening | ORAL | Status: DC | PRN
  Administered 2019-09-09: 6 mg via ORAL
  Filled 2019-09-09 (×2): qty 2

## 2019-09-09 NOTE — Progress Notes (Addendum)
PROGRESS NOTE    Mazin Emma  TIW:[id number] DOB: [birth year] DOA: 08/30/2019 PCP: Jarrett Soho, PA-C     Brief Narrative:  59 y.o.M with PMH of NICM, HFrEF, vtach/vfib with AICD and ETOH abuse who presents with a fall out of a chair at home, per report fall from standing. When EMS arrived, there was concern for loss of pulses and about 2 minutes of CPR performed with ROSC and then stiffening and posturing concerning for seizure. Pt in sinus rhythm on arrival to ED and awake though confused. Became increasingly agitated despite Ativan. He was hypoxic into 70%'s requiring facemask oxygen. Head CT was negative and pt was intubated for hypoxia and agitation. He was able to be successfully extubated aftera singleday 1/16-1/17. He was stabilized and transferred to triad hospitalist serviceon 2-3L via [place].  Overnight (early am) 1/19he wassubsequentlytransferred back to the ICU afterthe patient was reported to have difficulty breathing and was placed on a venturi mask 11L without improvement.Rapid response was calledapproximately 1600 1/19due to patient being obtunded with use ofaccessory musclesnoted. Patient transferred back to ICU on 1/19 for management ofcombinedacute hypoxemic and hypercarbic respiratory failure.  He is successfully extubated.  He is requiring 2L supplemental oxygen,saturating 94%. BIPAP at night and PRN.   New events last 24 hours / Subjective: Patient is currently off oxygen.  When he ambulated, his saturations were as low as 92%.  He reports no shortness of breath.  He is still having some swelling in his lower extremities.  No coughing or fevers.  Working with therapy has been fair.  His blood pressure spiked to the low 200s when he went to brush his teeth today.  He normally runs in the high 90s or low 100s systolic.  Assessment & Plan:   Principal Problem:   Encephalopathy  Resolved  Active Problems:   Deconditioning  PT/OT  appreciated  Rehab team to coordinate CIR placement when stable; due to lack of beds, other rehab options are encouraged  Consult Care Management    Chronic systolic CHF (congestive heart failure), NYHA class 2 (HCC)  Lasix 40 mg twice daily  Monitor I/os and wts  Salt restriction   Cont Amiodarone  Monitor CMP  Coreg 6.25 mg bid    Alcohol withdrawal syndrome, with delirium (HCC)  CIWA  MV  Thiamine    Acute respiratory failure (HCC)  Resolved  DVT prophylaxis: HPN Code Status: Full Family Communication: Self Disposition Plan: Pt coming from home; CIF/rehab; Barriers to discharge include poor availability of available beds for the patient   Consultants:   CCM  Rehab team    Antimicrobials:  Anti-infectives (From admission, onward)   Start     Dose/Rate Route Frequency Ordered Stop   08/31/19 0030  ceFAZolin (ANCEF) IVPB 2g/100 mL premix  Status:  Discontinued     2 g 200 mL/hr over 30 Minutes Intravenous Every 8 hours 08/31/19 0028 09/01/19 1417   08/31/19 0015  vancomycin (VANCOREADY) IVPB 2000 mg/400 mL     2,000 mg 200 mL/hr over 120 Minutes Intravenous  Once 08/31/19 0003 08/31/19 0311   08/31/19 0015  ceFEPIme (MAXIPIME) 2 g in sodium chloride 0.9 % 100 mL IVPB  Status:  Discontinued     2 g 200 mL/hr over 30 Minutes Intravenous  Once 08/31/19 0003 08/31/19 0028   Objective: Vitals:   09/09/19 0930 09/09/19 0935 09/09/19 0938 09/09/19 0940  BP: (!) 183/132 (!) 234/202 (!) 232/217 100/68  Pulse:    79  Resp:  Temp:      TempSrc:      SpO2: 92% 92%  93%  Weight:      Height:        Intake/Output Summary (Last 24 hours) at 09/09/2019 1254 Last data filed at 09/09/2019 2376 Gross per 24 hour  Intake 800 ml  Output 2150 ml  Net -1350 ml   Filed Weights   09/07/19 0030 09/08/19 0500 09/08/19 2200  Weight: 111.1 kg 110.9 kg 111 kg    Examination:  General exam: Appears calm and comfortable  Respiratory system: Clear to auscultation.  Respiratory effort normal. No respiratory distress. No conversational dyspnea.  Cardiovascular system: S1 & S2 heard, RRR. No murmurs. No pedal edema. Gastrointestinal system: Abdomen is nondistended, soft and nontender. Normal bowel sounds heard. Central nervous system: Alert and oriented. No focal neurological deficits. Speech clear.  Extremities: Symmetric in appearance  Skin: No rashes, lesions or ulcers on exposed skin  Psychiatry: Judgement and insight appear normal. Mood & affect appropriate.   Data Reviewed: I have personally reviewed following labs and imaging studies  CBC: Recent Labs  Lab 09/03/19 1912 09/03/19 1912 09/03/19 2019 09/04/19 0729 09/07/19 0620 09/08/19 0451 09/09/19 0831  WBC 6.5  --   --  3.7* 4.8 6.5 6.7  HGB 13.8   < > 13.9 11.0* 11.4* 12.0* 12.5*  HCT 47.9   < > 41.0 36.7* 37.2* 38.4* 40.4  MCV 109.1*  --   --  105.8* 104.2* 101.6* 102.0*  PLT 159  --   --  129* 193 179 268   < > = values in this interval not displayed.   Basic Metabolic Panel: Recent Labs  Lab 09/04/19 0235 09/04/19 0729 09/04/19 1431 09/05/19 0237 09/05/19 0822 09/06/19 0420 09/07/19 0620 09/08/19 0451 09/09/19 0831  NA  --    < >   < >  --  144 140 141 137 140  K  --    < >   < >  --  3.0* 2.9* 3.9 4.3 3.7  CL  --    < >   < >  --  105 100 105 101 103  CO2  --    < >   < >  --  26 29 26 26 27   GLUCOSE  --    < >   < >  --  131* 124* 114* 124* 129*  BUN  --    < >   < >  --  12 10 9 11 10   CREATININE  --    < >   < >  --  0.94 0.84 0.82 0.81 0.80  CALCIUM  --    < >   < >  --  8.5* 8.4* 8.7* 8.8* 9.3  MG 1.4*  --   --  1.8  --  1.8 1.7 1.7  --   PHOS 2.7  --   --  1.5*  --  2.5 3.1 2.9  --    < > = values in this interval not displayed.   GFR: Estimated Creatinine Clearance: 139.1 mL/min (by C-G formula based on SCr of 0.8 mg/dL). Liver Function Tests: Recent Labs  Lab 09/07/19 0620 09/08/19 0451 09/09/19 0831  AST 60* 64* 64*  ALT 31 32 36  ALKPHOS 135* 136*  139*  BILITOT 0.8 0.9 0.8  PROT 5.2* 5.4* 5.9*  ALBUMIN 2.5* 2.7* 2.8*   CBG: Recent Labs  Lab 09/04/19 1149 09/04/19 1541 09/04/19 1954 09/05/19 0746 09/07/19  0657  GLUCAP 110* 98 128* 99 111*    Recent Results (from the past 240 hour(s))  Respiratory Panel by RT PCR (Flu A&B, Covid) - Nasopharyngeal Swab     Status: None   Collection Time: 08/30/19  9:10 PM   Specimen: Nasopharyngeal Swab  Result Value Ref Range Status   SARS Coronavirus 2 by RT PCR NEGATIVE NEGATIVE Final    Comment: (NOTE) SARS-CoV-2 target nucleic acids are NOT DETECTED. The SARS-CoV-2 RNA is generally detectable in upper respiratoy specimens during the acute phase of infection. The lowest concentration of SARS-CoV-2 viral copies this assay can detect is 131 copies/mL. A negative result does not preclude SARS-Cov-2 infection and should not be used as the sole basis for treatment or other patient management decisions. A negative result may occur with  improper specimen collection/handling, submission of specimen other than nasopharyngeal swab, presence of viral mutation(s) within the areas targeted by this assay, and inadequate number of viral copies (<131 copies/mL). A negative result must be combined with clinical observations, patient history, and epidemiological information. The expected result is Negative. Fact Sheet for Patients:  https://www.moore.com/ Fact Sheet for Healthcare Providers:  https://www.young.biz/ This test is not yet ap proved or cleared by the [place] FDA and  has been authorized for detection and/or diagnosis of SARS-CoV-2 by FDA under an Emergency Use Authorization (EUA). This EUA will remain  in effect (meaning this test can be used) for the duration of the COVID-19 declaration under Section 564(b)(1) of the Act, 21 U.S.C. section 360bbb-3(b)(1), unless the authorization is terminated or revoked sooner.    Influenza A by PCR  NEGATIVE NEGATIVE Final   Influenza B by PCR NEGATIVE NEGATIVE Final    Comment: (NOTE) The Xpert Xpress SARS-CoV-2/FLU/RSV assay is intended as an aid in  the diagnosis of influenza from Nasopharyngeal swab specimens and  should not be used as a sole basis for treatment. Nasal washings and  aspirates are unacceptable for Xpert Xpress SARS-CoV-2/FLU/RSV  testing. Fact Sheet for Patients: https://www.moore.com/ Fact Sheet for Healthcare Providers: https://www.young.biz/ This test is not yet approved or cleared by the [place] FDA and  has been authorized for detection and/or diagnosis of SARS-CoV-2 by  FDA under an Emergency Use Authorization (EUA). This EUA will remain  in effect (meaning this test can be used) for the duration of the  Covid-19 declaration under Section 564(b)(1) of the Act, 21  U.S.C. section 360bbb-3(b)(1), unless the authorization is  terminated or revoked. Performed at [facility], [address], [place], [place] [address]   Urine culture     Status: None   Collection Time: 08/31/19  1:48 AM   Specimen: Urine, Random  Result Value Ref Range Status   Specimen Description URINE, RANDOM  Final   Special Requests NONE  Final   Culture   Final    NO GROWTH Performed at [facility], [address], [place], [place] [address]    Report Status 09/01/2019 FINAL  Final  Culture, blood (routine x 2)     Status: None   Collection Time: 08/31/19  2:18 AM   Specimen: BLOOD  Result Value Ref Range Status   Specimen Description BLOOD LEFT HAND  Final   Special Requests   Final    BOTTLES DRAWN AEROBIC ONLY Blood Culture results may not be optimal due to an inadequate volume of blood received in culture bottles   Culture   Final    NO GROWTH 5 DAYS Performed  at [facility], [address], [place], [place] [address]    Report Status 09/05/2019 FINAL  Final  Culture, blood (routine x 2)     Status: None    Collection Time: 08/31/19  2:38 AM   Specimen: BLOOD  Result Value Ref Range Status   Specimen Description BLOOD LEFT HAND  Final   Special Requests   Final    BOTTLES DRAWN AEROBIC ONLY Blood Culture results may not be optimal due to an inadequate volume of blood received in culture bottles   Culture   Final    NO GROWTH 5 DAYS Performed at [facility], [address], [place], [place] [address]    Report Status 09/05/2019 FINAL  Final     Scheduled Meds: . amiodarone  200 mg Oral Daily  . carvedilol  6.25 mg Oral BID WC  . folic acid  1 mg Oral Daily  . furosemide  40 mg Intravenous BID  . heparin injection (subcutaneous)  5,000 Units Subcutaneous Q8H  . multivitamin with minerals  1 tablet Oral Daily  . sodium chloride flush  3 mL Intravenous Q12H  . thiamine  100 mg Oral Daily   Continuous Infusions: . sodium chloride Stopped (09/04/19 1650)     LOS: 9 days      Time spent: 20 minutes   Sharlene Dory, DO [facility] 09/09/2019, 12:54 PM   Available via Epic secure chat 7am-7pm After these hours, please refer to coverage provider listed on [email or web address]

## 2019-09-09 NOTE — Progress Notes (Signed)
Occupational Therapy Treatment Patient Details Name: Justin Bernard MRN: [id number] DOB: 10-16-1960 Today's Date: 09/09/2019    History of present illness 59 y/o with  PMH of NICM, HFrEF, essential tremors, gait disorder, vtach/vfib with AICD and ETOH abuse who fell at home, EMS called and did not feel a pulse, ROSC with 2 minutes CPR and then concern for stiffening and posturing.  Pt was hypoxic and confused in the ED and then intubated.Acute respiratory failure secondary to acute metabolic encephalopathy.  Possible alcohol withdrawal.  Possible seizures   OT comments  Pt pleasant and agreeable to therapy. On RA throughout session with saturations >90 throughout. Pt was min A for ambulation to the sink (reaching out for furniture to stabilize, and with HHA from therapist); Pt seated at sink and able to complete oral care, face washing, hand washing, prior to ambulation to chair OT took vitals, and BP was 183/132, 234/202, switched arms and BP still 232/217, Pt developed severe headache (Pt rated 15/10), returned supine and BP was 100/68. (RN In room for last 2 BP) Pt headache down to 12/10 and provided with tylenol. Pt's pulse and O2 WFL throughout. Stayed in room for additional 10 min to monitor, and Pt able to move all 4 extremities, even smile, no drag, clear speech, vision at baseline. RN and NT aware and planning to check on patient in 10 min again. OT will continue to follow. Due to fatigue, cognition, deconditioning, CIR remains best option for Pt. Current POC remains appropriate and essential to maximize safety and independence in ADL and functional transfers.    Follow Up Recommendations  CIR;Supervision/Assistance - 24 hour    Equipment Recommendations  Other (comment)(defer to next venue of care)    Recommendations for Other Services      Precautions / Restrictions Precautions Precautions: Fall Precaution Comments: watch BP Restrictions Weight Bearing Restrictions: No        Mobility Bed Mobility Overal bed mobility: Needs Assistance Bed Mobility: Supine to Sit;Sit to Supine     Supine to sit: Supervision Sit to supine: Supervision   General bed mobility comments: no physical assist needed, supervision for safety  Transfers Overall transfer level: Needs assistance Equipment used: 1 person hand held assist(should use RW for safety) Transfers: Sit to/from Stand Sit to Stand: Min guard         General transfer comment: improved balance today, still relies on external assist for balance    Balance Overall balance assessment: Needs assistance Sitting-balance support: Feet supported Sitting balance-Leahy Scale: Good     Standing balance support: Bilateral upper extremity supported;During functional activity Standing balance-Leahy Scale: Poor Standing balance comment: furniture surfs and HHA                           ADL either performed or assessed with clinical judgement   ADL Overall ADL's : Needs assistance/impaired     Grooming: Set up;Sitting;Standing;Wash/dry hands;Wash/dry face;Oral care Grooming Details (indicate cue type and reason): sitting in chair at sink, able to lean forwards for water/spitting Upper Body Bathing: Moderate assistance Upper Body Bathing Details (indicate cue type and reason): assist for washing back and applying cortizone cream for itching         Lower Body Dressing: Min guard;Sitting/lateral leans Lower Body Dressing Details (indicate cue type and reason): able to don shoes EOB without assist Toilet Transfer: Minimal assistance;Ambulation Toilet Transfer Details (indicate cue type and reason): reaches for furniture support - and HHA  Functional mobility during ADLs: Minimal assistance;Cueing for safety General ADL Comments: limited session by BP     Vision       Perception     Praxis      Cognition Arousal/Alertness: Awake/alert Behavior During Therapy: WFL for tasks  assessed/performed Overall Cognitive Status: Impaired/Different from baseline Area of Impairment: Awareness;Problem solving;Safety/judgement                         Safety/Judgement: Decreased awareness of safety;Decreased awareness of deficits Awareness: Emergent Problem Solving: Requires verbal cues;Slow processing General Comments: vc for problem solving at sink, conversationally appropriate, vc for safety         Exercises     Shoulder Instructions       General Comments Session limited by BP (see vitals flowsheet) RN in room at end of session and aware    Pertinent Vitals/ Pain       Pain Assessment: 0-10 Pain Score: 10-Worst pain ever Pain Location: heache (at end of session) Pain Descriptors / Indicators: Headache;Sharp Pain Intervention(s): Limited activity within patient's tolerance;Monitored during session;Repositioned;RN gave pain meds during session  Home Living                                          Prior Functioning/Environment              Frequency  Min 2X/week        Progress Toward Goals  OT Goals(current goals can now be found in the care plan section)  Progress towards OT goals: Progressing toward goals(but limited due to BP)  Acute Rehab OT Goals Patient Stated Goal: do whatever I need to do to get better OT Goal Formulation: With patient Time For Goal Achievement: 09/16/19 Potential to Achieve Goals: Good  Plan Discharge plan remains appropriate;Frequency remains appropriate    Co-evaluation                 AM-PAC OT "6 Clicks" Daily Activity     Outcome Measure   Help from another person eating meals?: None Help from another person taking care of personal grooming?: A Little Help from another person toileting, which includes using toliet, bedpan, or urinal?: A Little Help from another person bathing (including washing, rinsing, drying)?: A Little Help from another person to put on and taking  off regular upper body clothing?: A Little Help from another person to put on and taking off regular lower body clothing?: A Little 6 Click Score: 19    End of Session Equipment Utilized During Treatment: Gait belt  OT Visit Diagnosis: Unsteadiness on feet (R26.81);Other abnormalities of gait and mobility (R26.89);Muscle weakness (generalized) (M62.81);Dizziness and giddiness (R42)   Activity Tolerance Treatment limited secondary to medical complications (Comment)(elevated BP and headache)   Patient Left in bed;with call bell/phone within reach;with bed alarm set;with nursing/sitter in room   Nurse Communication Mobility status(BP/headache)        Time: 8756-4332 OT Time Calculation (min): 41 min  Charges: OT General Charges $OT Visit: 1 Visit OT Treatments $Self Care/Home Management : 23-37 mins $Therapeutic Activity: 8-22 mins  Jesse Sans OTR/L Acute Rehabilitation Services Pager: [phone] Office: [place] 09/09/2019, 10:08 AM

## 2019-09-09 NOTE — TOC Initial Note (Addendum)
Transition of Care [facility]) - Initial/Assessment Note    Patient Details  Name: Justin Bernard MRN: [id number] Date of Birth: 1961/08/12  Transition [facility]) CM/SW Contact:    Bartholomew Crews, RN Phone Number: [phone] 09/09/2019, 5:09 PM  Clinical Narrative:                 Spoke with patient at the bedside. PTA home alone with his lab, [place]. Discussed options for rehab if not able to go to CIR d/t limited beds and long waiting list. Patient agreeable to [facility] PT and OT - will request charity assistance. Will need HH orders with Face to Face.   Discussed PCP follow up. Patient has not been able to see PA-C at [facility] since losing his insurance. Patient agreeable to appointment at [facility] which is close proximity to his home. NCM to schedule appointment in the AM.   States he already has DME at home, including rollator, wheelchair, and 3 canes.   Patient states his pharmacy is [facility]. He states that he received Match letter last admission, but he is currently still receiving medication assistance. He states that his sister, Arbie Cookey, will pick up his medications.   He states Arbie Cookey will provide transportation home. He also provided verbal permission for NCM to speak with Arbie Cookey as needed for discharge planning.   TOC following.   Expected Discharge Plan: [place] Barriers to Discharge: Continued Medical Work up   Patient Goals and CMS Choice Patient states their goals for this hospitalization and ongoing recovery are:: to go home to [place] his lab CMS Medicare.gov Compare Post Acute Care list provided to:: Patient Choice offered to / list presented to : Patient  Expected Discharge Plan and Services Expected Discharge Plan: [place] In-house Referral: Clinical Social Work, Development worker, community Discharge Planning Services: CM Consult, [place] Acute Care Choice: [place] arrangements  for the past 2 months: Single Family Home                 DME Arranged: N/A DME Agency: NA       HH Arranged: PT, OT          Prior Living Arrangements/Services Living arrangements for the past 2 months: Single Family Home Lives with:: Self, Pets Patient language and need for interpreter reviewed:: Yes Do you feel safe going back to the place where you live?: Yes      Need for Family Participation in Patient Care: Yes (Comment) Care giver support system in place?: Yes (comment) Current home services: DME Criminal Activity/Legal Involvement Pertinent to Current Situation/Hospitalization: No - Comment as needed  Activities of Daily Living Home Assistive Devices/Equipment: Cane (specify quad or straight), Walker (specify type), Wheelchair ADL Screening (condition at time of admission) Patient's cognitive ability adequate to safely complete daily activities?: Yes Is the patient deaf or have difficulty hearing?: No Does the patient have difficulty seeing, even when wearing glasses/contacts?: No Does the patient have difficulty concentrating, remembering, or making decisions?: No Patient able to express need for assistance with ADLs?: Yes Does the patient have difficulty dressing or bathing?: Yes Independently performs ADLs?: No Communication: Independent Dressing (OT): Needs assistance Is this a change from baseline?: Change from baseline, expected to last >3 days Grooming: Needs assistance Is this a change from baseline?: Change from baseline, expected to last >3 days Feeding: Independent Bathing: Needs assistance Is this a change from baseline?: Change from baseline, expected to  last >3 days Toileting: Needs assistance Is this a change from baseline?: Change from baseline, expected to last >3days In/Out Bed: Needs assistance Is this a change from baseline?: Change from baseline, expected to last >3 days Walks in Home: Needs assistance Is this a change from baseline?: Change  from baseline, expected to last >3 days Does the patient have difficulty walking or climbing stairs?: Yes Weakness of Legs: Both Weakness of Arms/Hands: None  Permission Sought/Granted Permission sought to share information with : Family Supports Permission granted to share information with : Yes, Verbal Permission Granted  Share Information with NAME: Andris Brothers     Permission granted to share info w Relationship: sister  Permission granted to share info w Contact Information: [phone]  Emotional Assessment Appearance:: Appears stated age Attitude/Demeanor/Rapport: Engaged Affect (typically observed): Accepting Orientation: : Oriented to Self, Oriented to  Time, Oriented to Place, Oriented to Situation Alcohol / Substance Use: Not Applicable Psych Involvement: No (comment)  Admission diagnosis:  Cardiac arrest (HCC) [I46.9] Hypokalemia [E87.6] Syncope [R55] Alcohol withdrawal syndrome, with delirium (HCC) [F10.231] Patient Active Problem List   Diagnosis Date Noted  . Encephalopathy   . Acute respiratory failure (HCC)   . Ventricular tachyarrhythmia (HCC) 05/18/2019  . NICM (nonischemic cardiomyopathy) (HCC) 05/18/2019  . AKI (acute kidney injury) (HCC) 05/18/2019  . Syncope and collapse 05/18/2019  . ICD (implantable cardioverter-defibrillator) discharge 05/16/2019  . Infection of shoulder (HCC) 05/29/2018  . Nephrolithiasis 02/20/2017  . ETOH abuse 09/22/2016  . Orthostatic hypotension 09/10/2016  . Urinary retention 09/10/2016  . Chronic venous stasis dermatitis 09/08/2016  . Diabetes mellitus type II, non insulin dependent (HCC) 08/13/2016  . Alcohol withdrawal syndrome, with delirium (HCC) 08/13/2016  . Painful orthopaedic hardware (HCC) 04/29/2016  . S/P hardware removal 04/29/2016  . ICD (implantable cardioverter-defibrillator) in place 08/04/2015  . Hypokalemia 07/17/2015  . Chronic systolic CHF (congestive heart failure), NYHA class 2 (HCC) 07/17/2015   . Fracture closed, humerus 07/17/2015   PCP:  Jarrett Soho, PA-C Pharmacy[facility]   [phone] [place] [place] - [address] AT [address] OF [facility] [address] [place] [address] Phone: [phone] Fax: [phone]  [facility] [address], [place] - [address] [place] [address] Phone: [phone] Fax: [phone]     Social Determinants of Health (SDOH) Interventions    Readmission Risk Interventions No flowsheet data found.

## 2019-09-09 NOTE — Progress Notes (Signed)
Physical Therapy Treatment Patient Details Name: Justin Bernard MRN: [id number] DOB: [birth year] Today's Date: 09/09/2019    History of Present Illness 59 y/o with  PMH of NICM, HFrEF, essential tremors, gait disorder, vtach/vfib with AICD and ETOH abuse who fell at home, EMS called and did not feel a pulse, ROSC with 2 minutes CPR and then concern for stiffening and posturing.  Pt was hypoxic and confused in the ED and then intubated.Acute respiratory failure secondary to acute metabolic encephalopathy.  Possible alcohol withdrawal.  Possible seizures    PT Comments    Pt had a more abbreviated PT session than OT today due to his increased standing BP after exerting to get to the sink.  Pt is still hoping to go to rehab, and with management of symptoms should be in a better position to get stronger to go home with family.  He is a bit tired this morning as well as a result of his issues, and is resting in the recliner when PT completed the session.  Resume more with therapy next visit based on his BP's and O2 sats, as well as ck of HR.   Follow Up Recommendations  CIR     Equipment Recommendations  None recommended by PT    Recommendations for Other Services       Precautions / Restrictions Precautions Precautions: Fall Precaution Comments: watch BP and sats Restrictions Weight Bearing Restrictions: No    Mobility  Bed Mobility Overal bed mobility: Needs Assistance Bed Mobility: Supine to Sit;Sit to Supine     Supine to sit: Supervision Sit to supine: Supervision   General bed mobility comments: on chair when PT arrived  Transfers Overall transfer level: Needs assistance Equipment used: 1 person hand held assist Transfers: Sit to/from Stand Sit to Stand: Supervision;From elevated surface         General transfer comment: pt remained in contact with chair with UE's due to his BP being high in standing earlier  Ambulation/Gait             General Gait  Details: held due to BP   Stairs             Wheelchair Mobility    Modified Rankin (Stroke Patients Only)       Balance Overall balance assessment: Needs assistance Sitting-balance support: Feet supported Sitting balance-Leahy Scale: Good     Standing balance support: Bilateral upper extremity supported;During functional activity Standing balance-Leahy Scale: Poor Standing balance comment: supervised with HHA on chair maintained                            Cognition Arousal/Alertness: Awake/alert Behavior During Therapy: WFL for tasks assessed/performed Overall Cognitive Status: Impaired/Different from baseline Area of Impairment: Awareness;Problem solving;Safety/judgement                       Following Commands: Follows one step commands with increased time Safety/Judgement: Decreased awareness of safety;Decreased awareness of deficits Awareness: Intellectual Problem Solving: Requires verbal cues General Comments: vc for problem solving at sink, conversationally appropriate, vc for safety       Exercises General Exercises - Lower Extremity Ankle Circles/Pumps: AROM;5 reps Long Arc Quad: Strengthening;10 reps Heel Slides: Strengthening;10 reps Hip ABduction/ADduction: Strengthening;10 reps Hip Flexion/Marching: AROM;10 reps    General Comments General comments (skin integrity, edema, etc.): did not work on standing balance and endurance since BP was quite high earlier  Pertinent Vitals/Pain Pain Assessment: No/denies pain Pain Score: 10-Worst pain ever Pain Location: heache (at end of session) Pain Descriptors / Indicators: Headache;Sharp Pain Intervention(s): Limited activity within patient's tolerance;Monitored during session;Repositioned;RN gave pain meds during session    Home Living                      Prior Function            PT Goals (current goals can now be found in the care plan section) Acute Rehab PT  Goals Patient Stated Goal: to get better  Progress towards PT goals: Progressing toward goals    Frequency    Min 3X/week      PT Plan Current plan remains appropriate    Co-evaluation              AM-PAC PT "6 Clicks" Mobility   Outcome Measure  Help needed turning from your back to your side while in a flat bed without using bedrails?: None Help needed moving from lying on your back to sitting on the side of a flat bed without using bedrails?: None Help needed moving to and from a bed to a chair (including a wheelchair)?: A Little Help needed standing up from a chair using your arms (e.g., wheelchair or bedside chair)?: A Little Help needed to walk in hospital room?: A Little Help needed climbing 3-5 steps with a railing? : A Lot 6 Click Score: 19    End of Session Equipment Utilized During Treatment: Oxygen Activity Tolerance: Patient limited by fatigue;Treatment limited secondary to medical complications (Comment) Patient left: in chair;with call bell/phone within reach;with chair alarm set Nurse Communication: Mobility status PT Visit Diagnosis: Other abnormalities of gait and mobility (R26.89);Difficulty in walking, not elsewhere classified (R26.2);History of falling (Z91.81)     Time: 3888-2800 PT Time Calculation (min) (ACUTE ONLY): 21 min  Charges:  $Therapeutic Exercise: 8-22 mins                   Ramond Dial 09/09/2019, 12:12 PM   Mee Hives, PT MS Acute Rehab Dept. Number: [place]

## 2019-09-09 NOTE — Progress Notes (Signed)
Inpatient Rehabilitation Admissions Coordinator  Noted BP issues limiting therapy today. I currently do not have a CIR bed for this patient . I will follow his progress but other rehab venues need to be explored. I have updated RN CM, [place] and SW, [place].  Ottie Glazier, RN, MSN Rehab Admissions Coordinator [phone] 09/09/2019 1:35 PM

## 2019-09-10 DIAGNOSIS — R5381 Other malaise: Secondary | ICD-10-CM | POA: Clinically undetermined

## 2019-09-10 LAB — COMPREHENSIVE METABOLIC PANEL
ALT: 31 U/L (ref 0–44)
AST: 68 U/L — ABNORMAL HIGH (ref 15–41)
Albumin: 2.5 g/dL — ABNORMAL LOW (ref 3.5–5.0)
Alkaline Phosphatase: 125 U/L (ref 38–126)
Anion gap: 10 (ref 5–15)
BUN: 10 mg/dL (ref 6–20)
CO2: 29 mmol/L (ref 22–32)
Calcium: 9 mg/dL (ref 8.9–10.3)
Chloride: 102 mmol/L (ref 98–111)
Creatinine, Ser: 0.8 mg/dL (ref 0.61–1.24)
GFR calc Af Amer: >60 mL/min (ref >60)
GFR calc non Af Amer: >60 mL/min (ref >60)
Glucose, Bld: 111 mg/dL — ABNORMAL HIGH (ref 70–99)
Potassium: 4 mmol/L (ref 3.5–5.1)
Sodium: 141 mmol/L (ref 135–145)
Total Bilirubin: 1.2 mg/dL (ref 0.3–1.2)
Total Protein: 5.1 g/dL — ABNORMAL LOW (ref 6.5–8.1)

## 2019-09-10 LAB — CBC
HCT: 39.2 % (ref 39.0–52.0)
Hemoglobin: 12.3 g/dL — ABNORMAL LOW (ref 13.0–17.0)
MCH: 31.6 pg (ref 26.0–34.0)
MCHC: 31.4 g/dL (ref 30.0–36.0)
MCV: 100.8 fL — ABNORMAL HIGH (ref 80.0–100.0)
Platelets: 235 10*3/uL (ref 150–400)
RBC: 3.89 MIL/uL — ABNORMAL LOW (ref 4.22–5.81)
RDW: 16.7 % — ABNORMAL HIGH (ref 11.5–15.5)
WBC: 6.6 10*3/uL (ref 4.0–10.5)
nRBC: 0 % (ref 0.0–0.2)

## 2019-09-10 MED ORDER — HYDROCORTISONE 1 % EX CREA: 1.0000 "application " | TOPICAL_CREAM | Freq: Two times a day (BID) | CUTANEOUS | 0 refills | Status: AC

## 2019-09-10 MED ORDER — FUROSEMIDE 40 MG PO TABS: 40.0000 mg | ORAL_TABLET | Freq: Two times a day (BID) | ORAL | 0 refills | Status: DC

## 2019-09-10 NOTE — TOC Progression Note (Signed)
Transition of Care [facility]) - Progression Note    Patient Details  Name: Justin Bernard MRN: [id number] Date of Birth: May 10, 1961  [facility]) CM/SW Contact  Bess Kinds, RN Phone Number: [phone] 09/10/2019, 8:53 AM  Clinical Narrative:    Spoke with representative at [facility]. Hospital f/u scheduled for 10/03/2019 at 9:20 am. TOC following.   Expected Discharge Plan: Home w Home Health Services Barriers to Discharge: Continued Medical Work up  Expected Discharge Plan and Services Expected Discharge Plan: Home w Home Health Services In-house Referral: Clinical Social Work, Artist Discharge Planning Services: CM Consult, [facility] Post Acute Care Choice: Home Health Living arrangements for the past 2 months: Single Family Home                 DME Arranged: N/A DME Agency: NA       HH Arranged: PT, OT           Social Determinants of Health (SDOH) Interventions    Readmission Risk Interventions No flowsheet data found.

## 2019-09-10 NOTE — TOC Transition Note (Signed)
Transition of Care [facility]) - CM/SW Discharge Note   Patient Details  Name: Justin Bernard MRN: [id number] Date of Birth: 1961/02/02  Transition of Care [facility]) CM/SW Contact:  Bess Kinds, RN Phone Number: [phone] 09/10/2019, 12:49 PM   Clinical Narrative:    Referral placed to [facility] for [facility] PT and OT. Patient qualified for charity and accepted. Spoke with patient at the bedside to follow up. Patient also advised of hospital follow up appointment scheduled at [facility]. HH and appointment information has been placed on discharge instructions. Patient verbalized understanding. He states that his sister will be picking him up at 2pm. No further TOC needs identified at this time.    Final next level of care: Home w Home Health Services Barriers to Discharge: No Barriers Identified   Patient Goals and CMS Choice Patient states their goals for this hospitalization and ongoing recovery are:: to go home to [facility] his lab CMS Medicare.gov Compare Post Acute Care list provided to:: Patient Choice offered to / list presented to : Patient  Discharge Placement                       Discharge Plan and Services In-house Referral: Clinical Social Work, Artist Discharge Planning Services: CM Consult, [facility] Post Acute Care Choice: Home Health          DME Arranged: N/A DME Agency: NA       HH Arranged: PT, OT HH Agency: [facility] Date [facility] Agency Contacted: 09/10/19 Time HH Agency Contacted: 1200 Representative spoke with at [facility] Agency: Kandee Keen  Social Determinants of Health (SDOH) Interventions     Readmission Risk Interventions No flowsheet data found.

## 2019-09-10 NOTE — Discharge Summary (Signed)
Physician Discharge Summary  Justin Bernard PRF:[id number] DOB: Jan 04, 1961 DOA: 08/30/2019  PCP: Justin Francois, NP  Admit date: 08/30/2019 Discharge date: 09/10/2019  Admitted From: Home Disposition: Home  Recommendations for Outpatient Follow-up:  1. Follow up with PCP in 1 week 2. Please obtain BMP/CBC in 1 week  Home Health: PT/OT  Equipment/Devices: None   Discharge Condition: Fair CODE STATUS: Full  Diet recommendation: Heart Healthy  Brief/Interim Summary: 59 y.o.M with PMH of NICM, HFrEF, vtach/vfib with AICD and ETOH abuse who presents with a fall out of a chair at home, per report fall from standing. When EMS arrived, there was concern for loss of pulses and about 2 minutes of CPR performed with ROSC and then stiffening and posturing concerning for seizure. Pt in sinus rhythm on arrival to ED and awake though confused. Became increasingly agitated despite Ativan. He was hypoxic into 70%'s requiring facemask oxygen. Head CT was negative and pt was intubated for hypoxia and agitation. He was able to be successfully extubated aftera singleday 1/16-1/17. He was stabilized and [facility] via . Overnight (early am) 1/19he wassubsequentlytransferred back to the ICU afterthe patient was reported to have difficulty breathing and was placed on a venturi mask 11L without improvement.Rapid response was calledapproximately 1600 1/19due to patient being obtunded with use ofaccessory musclesnoted. Patient transferred back to ICU on 1/19 for management ofcombinedacute hypoxemic and hypercarbic respiratory failure.  Pt continued to progress to room air and was able to work with physical therapy.  There was some concern of volume overload.  He ended up diuresing a considerable amount and the swelling in his lower extremities went down as his breathing improved.  The ideal plan was to get him set up with inpatient rehab, but due to poor  bed availability, it was agreed that he would go home with family and home health.  Subjective on day of discharge: Patient had an episode of chest pain prior to my arrival.  EKG unremarkable.  It was not brought on by exertion, rather after a meal.  Nothing made it better or worse.  No associated symptoms.  Pain is gone by the time I spoke with him.  Feeling relatively well and ready to go home.  Still a little weak.  Discharge Diagnoses:  Principal Problem:   Encephalopathy Active Problems:   Chronic systolic CHF (congestive heart failure), NYHA class 2 (HCC)   Diabetes mellitus type II, non insulin dependent (HCC)   Alcohol withdrawal syndrome, with delirium ([place])   Acute respiratory failure [facility])  Discharge Instructions  Discharge Instructions    Call MD for:  difficulty breathing, headache or visual disturbances   Complete by: As directed    Call MD for:  persistant dizziness or light-headedness   Complete by: As directed    Call MD for:  persistant nausea and vomiting   Complete by: As directed    Call MD for:  severe uncontrolled pain   Complete by: As directed    Call MD for:  temperature >100.4   Complete by: As directed    Diet - low sodium heart healthy   Complete by: As directed    Diet - low sodium heart healthy   Complete by: As directed    Increase activity slowly   Complete by: As directed    Increase activity slowly   Complete by: As directed      Allergies as of 09/10/2019      Reactions   Penicillins  Other (See Comments)   Has patient had a PCN reaction causing immediate rash, facial/tongue/throat swelling, SOB or lightheadedness with hypotension: Yes Has patient had a PCN reaction causing severe rash involving mucus membranes or skin necrosis: Yes Has patient had a PCN reaction that required hospitalization:  No Has patient had a PCN reaction occurring within the last 10 years: No If all of the above answers are "NO", then may proceed with Cephalosporin  use..  childhood reaction, swelling & had to be given an anti      Medication List    TAKE these medications   albuterol 108 (90 Base) MCG/ACT inhaler Commonly known as: VENTOLIN HFA Inhale 2 puffs into the lungs every 6 (six) hours as needed for wheezing or shortness of breath.   amiodarone 200 MG tablet Commonly known as: PACERONE Take 1 tablet (200 mg total) by mouth daily.   carvedilol 3.125 MG tablet Commonly known as: COREG Take 1 tablet (3.125 mg total) by mouth 2 (two) times daily with a meal.   furosemide 40 MG tablet Commonly known as: Lasix Take 1 tablet (40 mg total) by mouth 2 (two) times daily.   glucose monitoring kit monitoring kit 1 each by Does not apply route 4 (four) times daily - after meals and at bedtime. 1 month Diabetic Testing Supplies for QAC-QHS accuchecks.   hydrocortisone cream 1 % Apply 1 application topically 2 (two) times daily. What changed:   when to take this  reasons to take this   Magnesium Oxide 400 (240 Mg) MG Tabs Take 1 tablet (400 mg total) by mouth 2 (two) times daily.   multivitamin with minerals Tabs tablet Take 1 tablet by mouth daily.   potassium chloride SA 20 MEQ tablet Commonly known as: KLOR-CON Take 2 tablets by mouth in the a.m,, take 1 tablet by mouth in the afternoon   spironolactone 25 MG tablet Commonly known as: ALDACTONE Take 0.5 tablets (12.5 mg total) by mouth daily.       Allergies  Allergen Reactions  . Penicillins Other (See Comments)    Has patient had a PCN reaction causing immediate rash, facial/tongue/throat swelling, SOB or lightheadedness with hypotension: Yes Has patient had a PCN reaction causing severe rash involving mucus membranes or skin necrosis: Yes Has patient had a PCN reaction that required hospitalization:  No Has patient had a PCN reaction occurring within the last 10 years: No If all of the above answers are "NO", then may proceed with Cephalosporin use..   childhood  reaction, swelling & had to be given an anti    Consultations:  Critical care  Rehab team  Neurology   Procedures/Studies: EEG  Result Date: 08/31/2019 Justin Havens, MD     08/31/2019  6:53 PM Patient Name: Justin Bernard MRN: [id number] Epilepsy Attending: Lora Bernard Referring Physician/Provider: Dr Kerney Elbe Date: 08/31/2019 Duration: 23.28 mins Patient history: 59yo M with h/o alcohol use, who was found pulseless by EMS sp COR for 2 mins with ROSC. Upon awakening, he had some stiffening/tonic posturing. EEG to evaluate for seizure Level of alertness: awake, asleep/sedated AEDs during EEG study: Ativan Technical aspects: This EEG study was done with scalp electrodes positioned according to the 10-20 International system of electrode placement. Electrical activity was acquired at a sampling rate of '500Hz'$  and reviewed with a high frequency filter of '70Hz'$  and a low frequency filter of '1Hz'$ . EEG data were recorded continuously and digitally stored. DESCRIPTION: During awake state, no clear posterior dominant  rhythm was seen.  EEG showed continuous generalized 3-'5hz'$  theta-delta slowing admixed with 13-'15Hz'$  beta activity, maximal frontocentral. Hyperventilation and photic stimulation were not performed. ABNORMALITY - Continuous slow, generalized IMPRESSION: This study is suggestive of moderate diffuse encephalopathy, non specific to etiology but could be secondary to sedation, benzodiazepine use. No seizures or epileptiform discharges were seen throughout the recording. Justin Bernard   CT Head Wo Contrast  Result Date: 08/30/2019 CLINICAL DATA:  Seizure, nontraumatic (Age >= 38y) EXAM: CT HEAD WITHOUT CONTRAST TECHNIQUE: Contiguous axial images were obtained from the base of the skull through the vertex without intravenous contrast. COMPARISON:  Head CT 08/18/2016 FINDINGS: Brain: No intracranial hemorrhage, mass effect, or midline shift. Mild generalized atrophy, stable from prior.  No hydrocephalus. Chronic pineal calcifications, incidental. The basilar cisterns are patent. No evidence of territorial infarct or acute ischemia. No extra-axial or intracranial fluid collection. Vascular: No hyperdense vessel or unexpected calcification. Skull: No fracture or focal lesion. Sinuses/Orbits: Small mucous retention cyst in the left maxillary sinus. Paranasal sinuses are otherwise clear. Postsurgical change of the maxillary sinuses anteriorly. Bilateral cataract resection. Other: Small calcification in the right posterior scalp, unchanged. IMPRESSION: No acute intracranial abnormality. Electronically Signed   By: Keith Rake M.D.   On: 08/30/2019 21:05   DG CHEST PORT 1 VIEW  Result Date: 09/03/2019 CLINICAL DATA:  59 year old male with shortness of breath. EXAM: PORTABLE CHEST 1 VIEW COMPARISON:  Chest radiograph dated 08/31/2019. FINDINGS: Interval removal of endotracheal and enteric tubes. There is shallow inspiration with bibasilar atelectasis. Interval development of bilateral small pleural effusions, right greater left with associated bibasilar atelectasis although infiltrate is not excluded. Clinical correlation is recommended. No pneumothorax. Stable cardiomegaly. Left pectoral AICD device. No acute osseous pathology. IMPRESSION: 1. Interval removal of endotracheal and enteric tube. 2. Shallow inspiration with interval development of small bilateral pleural effusions, right greater left, and associated bibasilar atelectasis. Pneumonia is not excluded. Clinical correlation is recommended. Electronically Signed   By: Anner Crete M.D.   On: 09/03/2019 17:21   DG Chest Portable 1 View  Result Date: 08/31/2019 CLINICAL DATA:  Status post intubation EXAM: PORTABLE CHEST 1 VIEW COMPARISON:  Film from the previous day FINDINGS: Cardiac shadow is enlarged but stable. Defibrillator is again noted. Endotracheal tube and gastric catheter are now seen. Gastric catheter lies with the tip  just below the gastroesophageal junction. The proximal side port is noted in the distal esophagus. This could be advanced several cm. The lungs are well aerated with bibasilar atelectatic changes. No pneumothorax is seen. IMPRESSION: Tubes and lines as described above. The gastric catheter could be advanced several cm further into the stomach. Electronically Signed   By: Inez Catalina M.D.   On: 08/31/2019 00:39   DG Chest Portable 1 View  Result Date: 08/30/2019 CLINICAL DATA:  Post CPR. EXAM: PORTABLE CHEST 1 VIEW COMPARISON:  06/02/2019 FINDINGS: Left AICD remains in place, unchanged. Cardiomegaly, vascular congestion. Bibasilar atelectasis. No effusions or acute bony abnormality. IMPRESSION: Cardiomegaly, vascular congestion.  Bibasilar atelectasis. Electronically Signed   By: Rolm Baptise M.D.   On: 08/30/2019 21:10   DG Shoulder Right Port  Result Date: 08/31/2019 CLINICAL DATA:  Possible wound infection EXAM: PORTABLE RIGHT SHOULDER COMPARISON:  06/02/2019 FINDINGS: No acute fracture or dislocation is noted. Chronic changes related to prior fracture and healing are seen. No soft tissue abnormality is noted. IMPRESSION: Chronic changes without acute abnormality. Electronically Signed   By: Inez Catalina M.D.   On: 08/31/2019 00:40  ECHOCARDIOGRAM COMPLETE  Result Date: 08/31/2019   ECHOCARDIOGRAM REPORT   Patient Name:   LOXLEY SCHMALE [facility] Date of Exam: 08/31/2019 Medical Rec #:  [id number]            Height:       76.0 in Accession #:    [id number]           Weight:       258.0 lb Date of Birth:  1961-06-05           BSA:          2.47 m Patient Age:    53 years             BP:           92/62 mmHg Patient Gender: M                    HR:           55 bpm. Exam Location:  Inpatient Procedure: 2D Echo Indications:    syncope 780.2  History:        Patient has prior history of Echocardiogram examinations, most                 recent 05/16/2019. CHF; Risk Factors:Diabetes.  Sonographer:    Johny Chess Referring Phys: [id number] LAURA R GLEASON  Sonographer Comments: Echo performed with patient supine and on artificial respirator. IMPRESSIONS  1. Left ventricular ejection fraction, by visual estimation, is 40 to 45%. The left ventricle has mildly decreased function. There is no left ventricular hypertrophy.  2. The left ventricle demonstrates global hypokinesis.  3. Global right ventricle has normal systolic function.The right ventricular size is normal. No increase in right ventricular wall thickness.  4. Left atrial size was mildly dilated.  5. Right atrial size was normal.  6. The mitral valve is normal in structure. Trivial mitral valve regurgitation. No evidence of mitral stenosis.  7. The tricuspid valve is normal in structure.  8. The aortic valve is normal in structure. Aortic valve regurgitation is not visualized. No evidence of aortic valve sclerosis or stenosis.  9. The pulmonic valve was normal in structure. Pulmonic valve regurgitation is not visualized. 10. Normal pulmonary artery systolic pressure. 11. The inferior vena cava is normal in size with greater than 50% respiratory variability, suggesting right atrial pressure of 3 mmHg. 12. Compared to 05/16/2019, there are now Doppler signs of increased left atrial pressure. 13. Prior images reviewed side by side. FINDINGS  Left Ventricle: Left ventricular ejection fraction, by visual estimation, is 40 to 45%. The left ventricle has mildly decreased function. The left ventricle demonstrates global hypokinesis. There is no left ventricular hypertrophy. Normal left atrial pressure. Right Ventricle: The right ventricular size is normal. No increase in right ventricular wall thickness. Global RV systolic function is has normal systolic function. The tricuspid regurgitant velocity is 2.08 m/s, and with an assumed right atrial pressure  of 3 mmHg, the estimated right ventricular systolic pressure is normal at 20.3 mmHg. Left Atrium: Left atrial size  was mildly dilated. Right Atrium: Right atrial size was normal in size Pericardium: There is no evidence of pericardial effusion. Mitral Valve: The mitral valve is normal in structure. Trivial mitral valve regurgitation. No evidence of mitral valve stenosis by observation. Tricuspid Valve: The tricuspid valve is normal in structure. Tricuspid valve regurgitation is trivial. Aortic Valve: The aortic valve is normal in structure. Aortic valve regurgitation is not visualized. The aortic valve is  structurally normal, with no evidence of sclerosis or stenosis. Pulmonic Valve: The pulmonic valve was normal in structure. Pulmonic valve regurgitation is not visualized. Pulmonic regurgitation is not visualized. Aorta: The aortic root, ascending aorta and aortic arch are all structurally normal, with no evidence of dilitation or obstruction. Venous: The inferior vena cava is normal in size with greater than 50% respiratory variability, suggesting right atrial pressure of 3 mmHg. IAS/Shunts: No atrial level shunt detected by color flow Doppler. There is no evidence of a patent foramen ovale. No ventricular septal defect is seen or detected. There is no evidence of an atrial septal defect.  LEFT VENTRICLE PLAX 2D LVIDd:         5.40 cm  Diastology LVIDs:         4.79 cm  LV e' lateral:   8.00 cm/s LV PW:         0.90 cm  LV E/e' lateral: 7.7 LV IVS:        0.80 cm  LV e' medial:    4.00 cm/s LVOT diam:     2.40 cm  LV E/e' medial:  15.3 LV SV:         34 ml LV SV Index:   13.56 LVOT Area:     4.52 cm  RIGHT VENTRICLE RV S prime:     12.80 cm/s TAPSE (M-mode): 1.4 cm LEFT ATRIUM             Index       RIGHT ATRIUM           Index LA diam:        4.30 cm 1.74 cm/m  RA Area:     18.40 cm LA Vol (A2C):   43.7 ml 17.70 ml/m RA Volume:   46.80 ml  18.96 ml/m LA Vol (A4C):   78.3 ml 31.72 ml/m LA Biplane Vol: 64.3 ml 26.05 ml/m  AORTIC VALVE LVOT Vmax:   65.90 cm/s LVOT Vmean:  40.300 cm/s LVOT VTI:    0.143 m  AORTA Ao Root  diam: 3.40 cm MITRAL VALVE                        TRICUSPID VALVE MV Area (PHT): 5.02 cm             TR Peak grad:   17.3 mmHg MV PHT:        43.79 msec           TR Vmax:        208.00 cm/s MV Decel Time: 151 msec MV E velocity: 61.30 cm/s 103 cm/s  SHUNTS MV A velocity: 42.00 cm/s 70.3 cm/s Systemic VTI:  0.14 m MV E/A ratio:  1.46       1.5       Systemic Diam: 2.40 cm  Dani Gobble Croitoru MD Electronically signed by Sanda Klein MD Signature Date/Time: 08/31/2019/3:43:29 PM    Final    CUP PACEART REMOTE DEVICE CHECK  Result Date: 08/20/2019 Scheduled remote reviewed.  Normal device function.  2 NSVT events Next remote 91 days.  VAS [place] LOWER EXTREMITY VENOUS (DVT)  Result Date: 09/01/2019  Lower Venous Study Indications: Ventilation, immobilzation. Other Indications: Post CPR 08/30/19. Risk Factors: ETOH abuse. Limitations: Patient on ventilator, involuntary movement (flexing feet). Comparison Study: No prior study on file Performing Technologist: Sharion Dove RVS  Examination Guidelines: A complete evaluation includes B-mode imaging, spectral Doppler, color Doppler, and power Doppler as needed of all accessible  portions of each vessel. Bilateral testing is considered an integral part of a complete examination. Limited examinations for reoccurring indications may be performed as noted.  +---------+---------------+---------+-----------+----------+-----------------+ RIGHT    CompressibilityPhasicitySpontaneityPropertiesThrombus Aging    +---------+---------------+---------+-----------+----------+-----------------+ CFV      Full           Yes      Yes                                    +---------+---------------+---------+-----------+----------+-----------------+ SFJ      Full                                                           +---------+---------------+---------+-----------+----------+-----------------+ FV Prox  Full                                                            +---------+---------------+---------+-----------+----------+-----------------+ FV Mid   Full                                                           +---------+---------------+---------+-----------+----------+-----------------+ FV DistalFull                                                           +---------+---------------+---------+-----------+----------+-----------------+ PFV      Full                                                           +---------+---------------+---------+-----------+----------+-----------------+ POP      Full           Yes      Yes                                    +---------+---------------+---------+-----------+----------+-----------------+ PTV      Partial                                      Age Indeterminate +---------+---------------+---------+-----------+----------+-----------------+ PERO                                                  Not visualized    +---------+---------------+---------+-----------+----------+-----------------+   +---------+---------------+---------+-----------+----------+-----------------+ LEFT     CompressibilityPhasicitySpontaneityPropertiesThrombus Aging    +---------+---------------+---------+-----------+----------+-----------------+ CFV  Full           Yes      Yes                                    +---------+---------------+---------+-----------+----------+-----------------+ SFJ      Full                                                           +---------+---------------+---------+-----------+----------+-----------------+ FV Prox  Full                                                           +---------+---------------+---------+-----------+----------+-----------------+ FV Mid   Full                                                           +---------+---------------+---------+-----------+----------+-----------------+ FV DistalFull                                                            +---------+---------------+---------+-----------+----------+-----------------+ PFV      Full                                                           +---------+---------------+---------+-----------+----------+-----------------+ POP      Full           Yes      Yes                                    +---------+---------------+---------+-----------+----------+-----------------+ PTV      Full                                                           +---------+---------------+---------+-----------+----------+-----------------+ PERO     Partial                                      Age Indeterminate +---------+---------------+---------+-----------+----------+-----------------+     Summary: Right: Findings consistent with age indeterminate deep vein thrombosis involving the right posterior tibial veins. Left: Findings consistent with age indeterminate deep vein thrombosis involving the left peroneal veins.  *See table(s) above for measurements and observations. Electronically signed by Monica Martinez MD on 09/01/2019 at 3:54:38 PM.  Final      Discharge Exam: Vitals:   09/09/19 2213 09/10/19 0810  BP: 91/61 102/70  Pulse: 86 93  Resp:  18  Temp:    SpO2: 93% 94%    Exam General: Pt is alert, awake, not in acute distress Cardiovascular: RRR, S1/S2 +, 1+ edema up to distal third of tibia bilaterally, improved from yesterday Respiratory: CTA bilaterally, no wheezing, no rhonchi, no respiratory distress, no conversational dyspnea  Abdominal: Soft, NT, ND, bowel sounds + Extremities: no edema, no cyanosis Psych: Normal mood and affect, stable judgement and insight     The results of significant diagnostics from this hospitalization (including imaging, microbiology, ancillary and laboratory) are listed below for reference.     Labs: BNP (last 3 results) Recent Labs    09/24/18 1550 08/30/19 2035 09/03/19 1912  BNP 62.4 256.6* 667.8*    Basic Metabolic Panel: Recent Labs  Lab 09/04/19 0235 09/04/19 0729 09/05/19 0237 09/05/19 3646 09/06/19 0420 09/07/19 0620 09/08/19 0451 09/09/19 0831 09/10/19 0614  NA  --    < >  --    < > 140 141 137 140 141  K  --    < >  --    < > 2.9* 3.9 4.3 3.7 4.0  CL  --    < >  --    < > 100 105 101 103 102  CO2  --    < >  --    < > '29 26 26 27 29  '$ GLUCOSE  --    < >  --    < > 124* 114* 124* 129* 111*  BUN  --    < >  --    < > '10 9 11 10 10  '$ CREATININE  --    < >  --    < > 0.84 0.82 0.81 0.80 0.80  CALCIUM  --    < >  --    < > 8.4* 8.7* 8.8* 9.3 9.0  MG 1.4*  --  1.8  --  1.8 1.7 1.7  --   --   PHOS 2.7  --  1.5*  --  2.5 3.1 2.9  --   --    < > = values in this interval not displayed.   Liver Function Tests: Recent Labs  Lab 09/07/19 0620 09/08/19 0451 09/09/19 0831 09/10/19 0614  AST 60* 64* 64* 68*  ALT 31 32 36 31  ALKPHOS 135* 136* 139* 125  BILITOT 0.8 0.9 0.8 1.2  PROT 5.2* 5.4* 5.9* 5.1*  ALBUMIN 2.5* 2.7* 2.8* 2.5*   CBC: Recent Labs  Lab 09/04/19 0729 09/07/19 0620 09/08/19 0451 09/09/19 0831 09/10/19 0614  WBC 3.7* 4.8 6.5 6.7 6.6  HGB 11.0* 11.4* 12.0* 12.5* 12.3*  HCT 36.7* 37.2* 38.4* 40.4 39.2  MCV 105.8* 104.2* 101.6* 102.0* 100.8*  PLT 129* 193 179 268 235   CBG: Recent Labs  Lab 09/04/19 1149 09/04/19 1541 09/04/19 1954 09/05/19 0746 09/07/19 0657  GLUCAP 110* 98 128* 99 111*   Urinalysis    Component Value Date/Time   COLORURINE YELLOW 08/31/2019 0148   APPEARANCEUR CLEAR 08/31/2019 0148   LABSPEC 1.013 08/31/2019 0148   PHURINE 7.0 08/31/2019 0148   GLUCOSEU NEGATIVE 08/31/2019 0148   HGBUR NEGATIVE 08/31/2019 0148   BILIRUBINUR NEGATIVE 08/31/2019 0148   KETONESUR 20 (A) 08/31/2019 0148   PROTEINUR 30 (A) 08/31/2019 0148   NITRITE NEGATIVE 08/31/2019 0148   LEUKOCYTESUR NEGATIVE 08/31/2019 0148  Patient was seen and examined on the day of discharge and was found to be in stable condition. Time coordinating  discharge: 35 minutes including assessment and coordination of care, as well as examination of the patient.   SIGNED:  Shelda Pal, DO [facility] 09/10/2019, 10:32 AM

## 2019-09-10 NOTE — Progress Notes (Signed)
Patient complaining of 7/10 right sided chest pain that he describes as throbbing. Patient stated that the pain started when he laid down after eating. Vital signs obtained and charted. EKG preformed. MD notified. No further orders. Patient stated that pain had gone away. Tylenol PRN given. Will continue to monitor.

## 2019-09-10 NOTE — Discharge Instructions (Signed)

## 2019-09-10 NOTE — Progress Notes (Signed)
NT placed EKG in pt chart. RN notified.

## 2019-09-10 NOTE — Progress Notes (Signed)
DISCHARGE NOTE HOME Justin Bernard to be discharged Home per MD order. Discussed prescriptions and follow up appointments with the patient. Prescriptions given to patient; medication list explained in detail. Patient verbalized understanding.  Skin clean, dry and intact without evidence of skin break down, no evidence of skin tears noted. IV catheter discontinued intact. Site without signs and symptoms of complications. Dressing and pressure applied. Pt denies pain at the site currently. No complaints noted.  Patient free of lines, drains, and wounds.   An After Visit Summary (AVS) was printed and given to the patient. Patient escorted via wheelchair, and discharged home via private auto.  [name] BSN, RN3

## 2019-09-30 ENCOUNTER — Ambulatory Visit (INDEPENDENT_AMBULATORY_CARE_PROVIDER_SITE_OTHER): Payer: Medicaid Other | Admitting: Nurse Practitioner

## 2019-09-30 ENCOUNTER — Encounter: Payer: Self-pay | Admitting: Nurse Practitioner

## 2019-09-30 ENCOUNTER — Other Ambulatory Visit: Payer: Self-pay

## 2019-09-30 VITALS — BP 119/82 | HR 96 | Temp 98.1°F | Resp 14 | Ht 76.5 in | Wt 244.0 lb

## 2019-09-30 DIAGNOSIS — J9601 Acute respiratory failure with hypoxia: Secondary | ICD-10-CM

## 2019-09-30 DIAGNOSIS — Z0000 Encounter for general adult medical examination without abnormal findings: Secondary | ICD-10-CM

## 2019-09-30 DIAGNOSIS — E119 Type 2 diabetes mellitus without complications: Secondary | ICD-10-CM

## 2019-09-30 DIAGNOSIS — I5022 Chronic systolic (congestive) heart failure: Secondary | ICD-10-CM

## 2019-09-30 DIAGNOSIS — R809 Proteinuria, unspecified: Secondary | ICD-10-CM | POA: Insufficient documentation

## 2019-09-30 DIAGNOSIS — Z6829 Body mass index (BMI) 29.0-29.9, adult: Secondary | ICD-10-CM

## 2019-09-30 LAB — POCT GLYCOSYLATED HEMOGLOBIN (HGB A1C): Hemoglobin A1C: 5.1 % (ref 4.0–5.6)

## 2019-09-30 LAB — POCT URINALYSIS DIPSTICK
Bilirubin, UA: NEGATIVE
Blood, UA: NEGATIVE
Glucose, UA: NEGATIVE
Ketones, UA: NEGATIVE
Nitrite, UA: NEGATIVE
Protein, UA: POSITIVE — AB
Spec Grav, UA: 1.015 (ref 1.010–1.025)
Urobilinogen, UA: 0.2 E.U./dL
pH, UA: 7.5 (ref 5.0–8.0)

## 2019-09-30 MED ORDER — ALBUTEROL SULFATE HFA 108 (90 BASE) MCG/ACT IN AERS: 2.0000 | INHALATION_SPRAY | Freq: Four times a day (QID) | RESPIRATORY_TRACT | 0 refills | Status: AC | PRN

## 2019-09-30 NOTE — Patient Instructions (Signed)
Fat and Cholesterol Restricted Eating Plan Getting too much fat and cholesterol in your diet may cause health problems. Choosing the right foods helps keep your fat and cholesterol at normal levels. This can keep you from getting certain diseases. Your doctor may recommend an eating plan that includes:  Total fat: ______% or less of total calories a day.  Saturated fat: ______% or less of total calories a day.  Cholesterol: less than _________mg a day.  Fiber: ______g a day. What are tips for following this plan? Meal planning  At meals, divide your plate into four equal parts: ? Fill one-half of your plate with vegetables and green salads. ? Fill one-fourth of your plate with whole grains. ? Fill one-fourth of your plate with low-fat (lean) protein foods.  Eat fish that is high in omega-3 fats at least two times a week. This includes mackerel, tuna, sardines, and salmon.  Eat foods that are high in fiber, such as whole grains, beans, apples, broccoli, carrots, peas, and barley. General tips   Work with your doctor to lose weight if you need to.  Avoid: ? Foods with added sugar. ? Fried foods. ? Foods with partially hydrogenated oils.  Limit alcohol intake to no more than 1 drink a day for nonpregnant women and 2 drinks a day for men. One drink equals 12 oz of beer, 5 oz of wine, or 1 oz of hard liquor. Reading food labels  Check food labels for: ? Trans fats. ? Partially hydrogenated oils. ? Saturated fat (g) in each serving. ? Cholesterol (mg) in each serving. ? Fiber (g) in each serving.  Choose foods with healthy fats, such as: ? Monounsaturated fats. ? Polyunsaturated fats. ? Omega-3 fats.  Choose grain products that have whole grains. Look for the word "whole" as the first word in the ingredient list. Cooking  Cook foods using low-fat methods. These include baking, boiling, grilling, and broiling.  Eat more home-cooked foods. Eat at restaurants and buffets  less often.  Avoid cooking using saturated fats, such as butter, cream, palm oil, palm kernel oil, and coconut oil. Recommended foods  Fruits  All fresh, canned (in natural juice), or frozen fruits. Vegetables  Fresh or frozen vegetables (raw, steamed, roasted, or grilled). Green salads. Grains  Whole grains, such as whole wheat or whole grain breads, crackers, cereals, and pasta. Unsweetened oatmeal, bulgur, barley, quinoa, or brown rice. Corn or whole wheat flour tortillas. Meats and other protein foods  Ground beef (85% or leaner), grass-fed beef, or beef trimmed of fat. Skinless chicken or turkey. Ground chicken or turkey. Pork trimmed of fat. All fish and seafood. Egg whites. Dried beans, peas, or lentils. Unsalted nuts or seeds. Unsalted canned beans. Nut butters without added sugar or oil. Dairy  Low-fat or nonfat dairy products, such as skim or 1% milk, 2% or reduced-fat cheeses, low-fat and fat-free ricotta or cottage cheese, or plain low-fat and nonfat yogurt. Fats and oils  Tub margarine without trans fats. Light or reduced-fat mayonnaise and salad dressings. Avocado. Olive, canola, sesame, or safflower oils. The items listed above may not be a complete list of foods and beverages you can eat. Contact a dietitian for more information. Foods to avoid Fruits  Canned fruit in heavy syrup. Fruit in cream or butter sauce. Fried fruit. Vegetables  Vegetables cooked in cheese, cream, or butter sauce. Fried vegetables. Grains  White bread. White pasta. White rice. Cornbread. Bagels, pastries, and croissants. Crackers and snack foods that contain trans fat   and hydrogenated oils. Meats and other protein foods  Fatty cuts of meat. Ribs, chicken wings, bacon, sausage, bologna, salami, chitterlings, fatback, hot dogs, bratwurst, and packaged lunch meats. Liver and organ meats. Whole eggs and egg yolks. Chicken and turkey with skin. Fried meat. Dairy  Whole or 2% milk, cream,  half-and-half, and cream cheese. Whole milk cheeses. Whole-fat or sweetened yogurt. Full-fat cheeses. Nondairy creamers and whipped toppings. Processed cheese, cheese spreads, and cheese curds. Beverages  Alcohol. Sugar-sweetened drinks such as sodas, lemonade, and fruit drinks. Fats and oils  Butter, stick margarine, lard, shortening, ghee, or bacon fat. Coconut, palm kernel, and palm oils. Sweets and desserts  Corn syrup, sugars, honey, and molasses. Candy. Jam and jelly. Syrup. Sweetened cereals. Cookies, pies, cakes, donuts, muffins, and ice cream. The items listed above may not be a complete list of foods and beverages you should avoid. Contact a dietitian for more information. Summary  Choosing the right foods helps keep your fat and cholesterol at normal levels. This can keep you from getting certain diseases.  At meals, fill one-half of your plate with vegetables and green salads.  Eat high-fiber foods, like whole grains, beans, apples, carrots, peas, and barley.  Limit added sugar, saturated fats, alcohol, and fried foods. This information is not intended to replace advice given to you by your health care provider. Make sure you discuss any questions you have with your health care provider. Document Revised: 04/04/2018 Document Reviewed: 04/18/2017 Elsevier Patient Education  2020 Else[employer] Inc.  

## 2019-09-30 NOTE — Progress Notes (Signed)
New Patient Office Visit  Subjective:  Patient ID: Justin Bernard, male    DOB: [birth year]  Age: 59 y.o. MRN: [id number]  CC:  Chief Complaint  Patient presents with  . Establish Care    CHF   . Hospitalization Follow-up    seizure   . Diabetes    HPI Justin Bernard presents for hospital follow up.  He is a new patient.  He did have some insurance issues with his primary care provider.  He has a history of systolic heart failure, hypertension, ventricular tachycardia, orthostatic hypotension, nonischemic cardiomyopathy and diabetes currently diet-controlled. He states that he had a fall that was related to a seizure.  He denies a history of seizure.  He states that in October 2020 he suffered a fall that was related to low blood glucose.  His current A1c is 5.1%.  He is not taking any medications.  He admits that he is on a no salt diet so he does not cook all his foods from home.  He does walk his dog. He also has episodes of low blood pressure.  This makes him dizzy.  He knows nail to sit back down.  He denies any current chest pain, shortness of breath, dyspnea on exertion nausea, vomiting.  He does follow-up with cardiology for his heart failure and tachycardia.  He is currently on amiodarone and admits that it possibly be discontinued.  He feels like he is doing well overall.  He continues to do daily weights and his weight has been stable.  On Friday morning he did take a dose of Lasix because of increased swelling.                Past Medical History:  Diagnosis Date  . AICD (automatic cardioverter/defibrillator) present   . Arthritis    L hip   . Asthma    pt. uses albuterol inhaler in the spring for enviromental allergies   . Cataract    LEFT EYE  . Chronic systolic heart failure (HCC)    NICM, previous heart transplant candidate with EF 10%  . Diabetes mellitus without complication ([place])    TYPE 2  . Family history of adverse reaction to anesthesia     sister- headache & N&V am and mother  . History of hiatal hernia   . History of kidney stones   . Hypertension   . Hypokalemia   . Neuromuscular disorder ([place])    essential tremor  . Osteomyelitis ([place])    a. MSSA osteomyelitis of the right shoulder.  . Ventricular fibrillation ([place])   . Ventricular tachycardia [facility])     Past Surgical History:  Procedure Laterality Date  . BACK SURGERY  07/2000   fusion , [facility]  . BONE EXCISION Right 04/29/2016   Procedure: EXCISION HETEROTOPIC BONE RIGHT HUMERUS;  Surgeon: Altamese [place], MD;  Location: [place];  Service: Orthopedics;  Laterality: Right;  . CARDIAC CATHETERIZATION  08/2012  . CYSTOSCOPY/URETEROSCOPY/HOLMIUM LASER/STENT PLACEMENT Bilateral 04/03/2017   Procedure: CYSTOSCOPY/URETEROSCOPY/HOLMIUM LASER/LEFT STENT REPLACEMENT AND RIGHT   STENT PLACEMENT;  Surgeon: Nickie Retort, MD;  Location: [facility];  Service: Urology;  Laterality: Bilateral;  . FRACTURE SURGERY Left    hip , r shoulder   . HARDWARE REMOVAL Right 04/29/2016   Procedure: HARDWARE REMOVAL;  Surgeon: Altamese [place], MD;  Location: [place];  Service: Orthopedics;  Laterality: Right;  . HERNIA REPAIR Right    inguinal   . HIP SURGERY Left  YRS AGO   hardware present, for slipped joint   . HOLMIUM LASER APPLICATION Bilateral 0/86/5784   Procedure: HOLMIUM LASER APPLICATION;  Surgeon: Nickie Retort, MD;  Location: [facility];  Service: Urology;  Laterality: Bilateral;  . ICD  2012   [employer]  . IR URETERAL STENT LEFT NEW ACCESS W/O SEP NEPHROSTOMY CATH  02/20/2017  . NEPHROLITHOTOMY Left 02/20/2017   Procedure: NEPHROLITHOTOMY PERCUTANEOUS;  Surgeon: Nickie Retort, MD;  Location: [facility];  Service: Urology;  Laterality: Left;  . ORIF HUMERUS FRACTURE Right 07/20/2015   Procedure: OPEN REDUCTION INTERNAL FIXATION (ORIF) PROXIMAL HUMERUS FRACTURE;  Surgeon: Netta [place], MD;  Location: [place];  Service: Orthopedics;  Laterality: Right;  .  TONSILLECTOMY  AS CHILD  . ulnar surgery Right     Family History  Problem Relation Age of Onset  . Heart failure Mother   . Hypertension Mother   . CAD Father   . Hypertension Other   . CAD Other     Social History   Socioeconomic History  . Marital status: Divorced    Spouse name: Not on file  . Number of children: Not on file  . Years of education: Not on file  . Highest education level: Not on file  Occupational History  . Not on file  Tobacco Use  . Smoking status: Never Smoker  . Smokeless tobacco: Never Used  Substance and Sexual Activity  . Alcohol use: No  . Drug use: No  . Sexual activity: Not Currently  Other Topics Concern  . Not on file  Social History Narrative  . Not on file   Social Determinants of Health   Financial Resource Strain:   . Difficulty of Paying Living Expenses: Not on file  Food Insecurity:   . Worried About Charity fundraiser in the Last Year: Not on file  . Ran Out of Food in the Last Year: Not on file  Transportation Needs:   . Lack of Transportation (Medical): Not on file  . Lack of Transportation (Non-Medical): Not on file  Physical Activity:   . Days of Exercise per Week: Not on file  . Minutes of Exercise per Session: Not on file  Stress:   . Feeling of Stress : Not on file  Social Connections:   . Frequency of Communication with Friends and Family: Not on file  . Frequency of Social Gatherings with Friends and Family: Not on file  . Attends Religious Services: Not on file  . Active Member of Clubs or Organizations: Not on file  . Attends Archivist Meetings: Not on file  . Marital Status: Not on file  Intimate Partner Violence:   . Fear of Current or Ex-Partner: Not on file  . Emotionally Abused: Not on file  . Physically Abused: Not on file  . Sexually Abused: Not on file    ROS Review of Systems  Constitutional: Negative.   HENT: Negative.   Eyes: Negative.   Respiratory: Negative.    Cardiovascular: Negative.   Gastrointestinal: Negative.   Endocrine: Negative.   Genitourinary: Negative.   Musculoskeletal: Negative.   Skin: Negative.   Allergic/Immunologic: Negative.   Neurological: Negative.   Hematological: Negative.   Psychiatric/Behavioral: Negative.     Objective:   Today's Vitals: BP 119/82 (BP Location: Left Arm, Patient Position: Sitting, Cuff Size: Normal)   Pulse 96   Temp 98.1 F (36.7 C) (Oral)   Resp 14   Ht 6' 4.5" (1.943 m)  Wt 244 lb (110.7 kg)   SpO2 97%   BMI 29.31 kg/m   Physical Exam Constitutional:      Appearance: He is obese.  HENT:     Head: Normocephalic.     Mouth/Throat:     Mouth: Mucous membranes are moist.     Pharynx: Oropharynx is clear.  Cardiovascular:     Rate and Rhythm: Normal rate and regular rhythm.     Comments: 1 + pitting edema right  Pulmonary:     Effort: Pulmonary effort is normal.     Comments: Decreased BS in bilateral bases  Abdominal:     General: Bowel sounds are normal.     Palpations: Abdomen is soft.  Musculoskeletal:        General: Normal range of motion.     Cervical back: Normal range of motion.  Skin:    General: Skin is warm and dry.     Coloration: Skin is pale.  Neurological:     Mental Status: He is alert and oriented to person, place, and time.  Psychiatric:        Mood and Affect: Mood normal.        Behavior: Behavior normal.        Thought Content: Thought content normal.        Judgment: Judgment normal.     Assessment & Plan:   Problem List Items Addressed This Visit      High   Chronic systolic CHF (congestive heart failure), NYHA class 2 (HCC) - Primary (Chronic)   Relevant Orders   Microalbumin, urine   Diabetes mellitus type II, non insulin dependent ([place])   Relevant Orders   Urinalysis Dipstick (Completed)   HgB A1c (Completed)   Microalbumin, urine   Comp. Metabolic Panel (12)     Unprioritized   RESOLVED: Acute respiratory failure (HCC)    Relevant Orders   CBC with Differential/Platelet    Other Visit Diagnoses    Body mass index 29.0-29.9, adult       Healthcare maintenance       Relevant Orders   PSA   Lipid Panel      Outpatient Encounter Medications as of 09/30/2019  Medication Sig  . albuterol (VENTOLIN HFA) 108 (90 Base) MCG/ACT inhaler Inhale 2 puffs into the lungs every 6 (six) hours as needed for wheezing or shortness of breath.  Marland Kitchen amiodarone (PACERONE) 200 MG tablet Take 1 tablet (200 mg total) by mouth daily.  . carvedilol (COREG) 3.125 MG tablet Take 1 tablet (3.125 mg total) by mouth 2 (two) times daily with a meal.  . furosemide (LASIX) 40 MG tablet Take 1 tablet (40 mg total) by mouth 2 (two) times daily.  Marland Kitchen glucose monitoring kit (FREESTYLE) monitoring kit 1 each by Does not apply route 4 (four) times daily - after meals and at bedtime. 1 month Diabetic Testing Supplies for QAC-QHS accuchecks.  . hydrocortisone cream 1 % Apply 1 application topically 2 (two) times daily.  . Magnesium Oxide 400 (240 Mg) MG TABS Take 1 tablet (400 mg total) by mouth 2 (two) times daily.  . Multiple Vitamin (MULTIVITAMIN WITH MINERALS) TABS tablet Take 1 tablet by mouth daily.  . potassium chloride SA (KLOR-CON) 20 MEQ tablet Take 2 tablets by mouth in the a.m,, take 1 tablet by mouth in the afternoon  . spironolactone (ALDACTONE) 25 MG tablet Take 0.5 tablets (12.5 mg total) by mouth daily.  . [DISCONTINUED] albuterol (PROVENTIL HFA;VENTOLIN HFA) 108 (90 Base) MCG/ACT  inhaler Inhale 2 puffs into the lungs every 6 (six) hours as needed for wheezing or shortness of breath.   No facility-administered encounter medications on file as of 09/30/2019.    Follow-up: Return in about 3 months (around 12/28/2019).   Vevelyn Francois, NP

## 2019-10-01 LAB — CBC WITH DIFFERENTIAL/PLATELET
Basophils Absolute: 0.1 10*3/uL (ref 0.0–0.2)
Basos: 1 %
EOS (ABSOLUTE): 0.6 10*3/uL — ABNORMAL HIGH (ref 0.0–0.4)
Eos: 9 %
Hematocrit: 36.3 % — ABNORMAL LOW (ref 37.5–51.0)
Hemoglobin: 11.8 g/dL — ABNORMAL LOW (ref 13.0–17.7)
Immature Grans (Abs): 0 10*3/uL (ref 0.0–0.1)
Immature Granulocytes: 0 %
Lymphocytes Absolute: 1.2 10*3/uL (ref 0.7–3.1)
Lymphs: 20 %
MCH: 30.2 pg (ref 26.6–33.0)
MCHC: 32.5 g/dL (ref 31.5–35.7)
MCV: 93 fL (ref 79–97)
Monocytes Absolute: 0.6 10*3/uL (ref 0.1–0.9)
Monocytes: 9 %
Neutrophils Absolute: 3.8 10*3/uL (ref 1.4–7.0)
Neutrophils: 61 %
Platelets: 318 10*3/uL (ref 150–450)
RBC: 3.91 x10E6/uL — ABNORMAL LOW (ref 4.14–5.80)
RDW: 14.1 % (ref 11.6–15.4)
WBC: 6.2 10*3/uL (ref 3.4–10.8)

## 2019-10-01 LAB — COMP. METABOLIC PANEL (12)
AST: 37 IU/L (ref 0–40)
Albumin/Globulin Ratio: 1.5 (ref 1.2–2.2)
Albumin: 3.3 g/dL — ABNORMAL LOW (ref 3.8–4.9)
Alkaline Phosphatase: 119 IU/L — ABNORMAL HIGH (ref 39–117)
BUN/Creatinine Ratio: 7 — ABNORMAL LOW (ref 9–20)
BUN: 6 mg/dL (ref 6–24)
Bilirubin Total: 0.4 mg/dL (ref 0.0–1.2)
Calcium: 9 mg/dL (ref 8.7–10.2)
Chloride: 107 mmol/L — ABNORMAL HIGH (ref 96–106)
Creatinine, Ser: 0.83 mg/dL (ref 0.76–1.27)
GFR calc Af Amer: 113 mL/min/{1.73_m2} (ref >59)
GFR calc non Af Amer: 98 mL/min/{1.73_m2} (ref >59)
Globulin, Total: 2.2 g/dL (ref 1.5–4.5)
Glucose: 86 mg/dL (ref 65–99)
Potassium: 4 mmol/L (ref 3.5–5.2)
Sodium: 143 mmol/L (ref 134–144)
Total Protein: 5.5 g/dL — ABNORMAL LOW (ref 6.0–8.5)

## 2019-10-01 LAB — LIPID PANEL
Chol/HDL Ratio: 4.1 ratio (ref 0.0–5.0)
Cholesterol, Total: 149 mg/dL (ref 100–199)
HDL: 36 mg/dL — ABNORMAL LOW (ref >39)
LDL Chol Calc (NIH): 88 mg/dL (ref 0–99)
Triglycerides: 140 mg/dL (ref 0–149)
VLDL Cholesterol Cal: 25 mg/dL (ref 5–40)

## 2019-10-01 LAB — MICROALBUMIN, URINE: Microalbumin, Urine: 13.9 ug/mL

## 2019-10-01 LAB — PSA: Prostate Specific Ag, Serum: 0.4 ng/mL (ref 0.0–4.0)

## 2019-10-01 NOTE — Progress Notes (Signed)
Electrophysiology Office Note Date: 10/02/2019  ID:  Justin Bernard, DOB 03-Jun-1961, MRN [id number]  PCP: Vevelyn Francois, NP Primary Cardiologist: Loralie Champagne, MD Electrophysiologist: Cristopher Peru, MD   CC: Routine ICD follow-up  Justin Bernard is a 59 y.o. male seen today for Dr. Lovena Le.  They present today for post hospital follow up. Admitted from 1/15 to 1/26 for collapse. There was some concern for LOC and ? Cardiac arrest, but patient also had stiffening and posturing concerning for seizure. ICD interrogation on arrival with no arrhythmia found despite K of 2.5. He had a prolonged hospital stay and was initially intubated. CT head showed no acute changes. EEG with moderate diffuse encephalopathy. This gradually resolved and he was released to home with family support. There was some question of seizure, but with no further and negative EEG, Neuro did not recommend long term anticonvulsant. Pt is feeling much better. He was told it was a seizure that led to his admission. He has no scheduled neurology follow up to his knowledge.   He was working with PT and OT up until last week. He has mild peripheral edema, but has not taken his lasix today. He denies chest pain, palpitations, dyspnea, PND, orthopnea, nausea, vomiting, dizziness, syncope, edema, weight gain, or early satiety.  He has not had any further ICD shocks.   Device History: Research officer, political party ICD implanted 2012 for chronic systolic CHF History of appropriate therapy: Yes History of AAD therapy: Yes   Past Medical History:  Diagnosis Date  . AICD (automatic cardioverter/defibrillator) present   . Arthritis    L hip   . Asthma    pt. uses albuterol inhaler in the spring for enviromental allergies   . Cataract    LEFT EYE  . Chronic systolic heart failure (HCC)    NICM, previous heart transplant candidate with EF 10%  . Diabetes mellitus without complication ([place])    TYPE 2  . Family  history of adverse reaction to anesthesia    sister- headache & N&V am and mother  . History of hiatal hernia   . History of kidney stones   . Hypertension   . Hypokalemia   . Neuromuscular disorder ([place])    essential tremor  . Osteomyelitis ([place])    a. MSSA osteomyelitis of the right shoulder.  . Ventricular fibrillation ([place])   . Ventricular tachycardia [facility])    Past Surgical History:  Procedure Laterality Date  . BACK SURGERY  07/2000   fusion , [facility]  . BONE EXCISION Right 04/29/2016   Procedure: EXCISION HETEROTOPIC BONE RIGHT HUMERUS;  Surgeon: Altamese Putnam, MD;  Location: [place];  Service: Orthopedics;  Laterality: Right;  . CARDIAC CATHETERIZATION  08/2012  . CYSTOSCOPY/URETEROSCOPY/HOLMIUM LASER/STENT PLACEMENT Bilateral 04/03/2017   Procedure: CYSTOSCOPY/URETEROSCOPY/HOLMIUM LASER/LEFT STENT REPLACEMENT AND RIGHT   STENT PLACEMENT;  Surgeon: Nickie Retort, MD;  [facility];  Service: Urology;  Laterality: Bilateral;  . FRACTURE SURGERY Left    hip , r shoulder   . HARDWARE REMOVAL Right 04/29/2016   Procedure: HARDWARE REMOVAL;  Surgeon: Altamese [place], MD;  Location: [place];  Service: Orthopedics;  Laterality: Right;  . HERNIA REPAIR Right    inguinal   . HIP SURGERY Left YRS AGO   hardware present, for slipped joint   . HOLMIUM LASER APPLICATION Bilateral 2/44/6286   Procedure: HOLMIUM LASER APPLICATION;  Surgeon: Nickie Retort, MD;  [facility];  Service: Urology;  Laterality:  Bilateral;  . ICD  2012   [employer]  . IR URETERAL STENT LEFT NEW ACCESS W/O SEP NEPHROSTOMY CATH  02/20/2017  . NEPHROLITHOTOMY Left 02/20/2017   Procedure: NEPHROLITHOTOMY PERCUTANEOUS;  Surgeon: Nickie Retort, MD;  [facility];  Service: Urology;  Laterality: Left;  . ORIF HUMERUS FRACTURE Right 07/20/2015   Procedure: OPEN REDUCTION INTERNAL FIXATION (ORIF) PROXIMAL HUMERUS FRACTURE;  Surgeon: Netta Cedars, MD;  Location: [place];  Service:  Orthopedics;  Laterality: Right;  . TONSILLECTOMY  AS CHILD  . ulnar surgery Right     Current Outpatient Medications  Medication Sig Dispense Refill  . albuterol (VENTOLIN HFA) 108 (90 Base) MCG/ACT inhaler Inhale 2 puffs into the lungs every 6 (six) hours as needed for wheezing or shortness of breath. 6.7 g 0  . carvedilol (COREG) 3.125 MG tablet Take 1 tablet (3.125 mg total) by mouth 2 (two) times daily with a meal. 60 tablet 6  . furosemide (LASIX) 40 MG tablet Take 40 mg by mouth as needed for edema (swelling).    Marland Kitchen glucose monitoring kit (FREESTYLE) monitoring kit 1 each by Does not apply route 4 (four) times daily - after meals and at bedtime. 1 month Diabetic Testing Supplies for QAC-QHS accuchecks. 1 each 1  . hydrocortisone cream 1 % Apply 1 application topically 2 (two) times daily. 30 g 0  . Magnesium Oxide 400 (240 Mg) MG TABS Take 1 tablet (400 mg total) by mouth 2 (two) times daily. 60 tablet 2  . Multiple Vitamin (MULTIVITAMIN WITH MINERALS) TABS tablet Take 1 tablet by mouth daily. 30 tablet 0  . potassium chloride SA (KLOR-CON) 20 MEQ tablet Take 2 tablets by mouth in the a.m,, take 1 tablet by mouth in the afternoon 90 tablet 3  . spironolactone (ALDACTONE) 25 MG tablet Take 0.5 tablets (12.5 mg total) by mouth daily. 15 tablet 11   No current facility-administered medications for this visit.    Allergies:   Penicillins   Social History: Social History   Socioeconomic History  . Marital status: Divorced    Spouse name: Not on file  . Number of children: Not on file  . Years of education: Not on file  . Highest education level: Not on file  Occupational History  . Not on file  Tobacco Use  . Smoking status: Never Smoker  . Smokeless tobacco: Never Used  Substance and Sexual Activity  . Alcohol use: No  . Drug use: No  . Sexual activity: Not Currently  Other Topics Concern  . Not on file  Social History Narrative  . Not on file   Social Determinants of  Health   Financial Resource Strain:   . Difficulty of Paying Living Expenses: Not on file  Food Insecurity:   . Worried About Charity fundraiser in the Last Year: Not on file  . Ran Out of Food in the Last Year: Not on file  Transportation Needs:   . Lack of Transportation (Medical): Not on file  . Lack of Transportation (Non-Medical): Not on file  Physical Activity:   . Days of Exercise per Week: Not on file  . Minutes of Exercise per Session: Not on file  Stress:   . Feeling of Stress : Not on file  Social Connections:   . Frequency of Communication with Friends and Family: Not on file  . Frequency of Social Gatherings with Friends and Family: Not on file  . Attends Religious Services: Not on file  .  Active Member of Clubs or Organizations: Not on file  . Attends Archivist Meetings: Not on file  . Marital Status: Not on file  Intimate Partner Violence:   . Fear of Current or Ex-Partner: Not on file  . Emotionally Abused: Not on file  . Physically Abused: Not on file  . Sexually Abused: Not on file    Family History: Family History  Problem Relation Age of Onset  . Heart failure Mother   . Hypertension Mother   . CAD Father   . Hypertension Other   . CAD Other     Review of Systems: All other systems reviewed and are otherwise negative except as noted above.   Physical Exam: Vitals:   10/02/19 1157  BP: 102/80  Pulse: 88  SpO2: 97%  Weight: 244 lb 12.8 oz (111 kg)  Height: 6' 4.5" (1.943 m)     GEN- The patient is well appearing, alert and oriented x 3 today.   HEENT: normocephalic, atraumatic; sclera clear, conjunctiva pink; hearing intact; oropharynx clear; neck supple, no JVP Lymph- no cervical lymphadenopathy Lungs- Clear to ausculation bilaterally, normal work of breathing.  No wheezes, rales, rhonchi Heart- Regular rate and rhythm, no murmurs, rubs or gallops, PMI not laterally displaced GI- soft, non-tender, non-distended, bowel sounds  present, no hepatosplenomegaly Extremities- no clubbing or cyanosis. Mild peripheral edema. Reports near baseline. DP/PT/radial pulses 2+ bilaterally MS- no significant deformity or atrophy Skin- warm and dry, no rash or lesion; ICD pocket well healed Psych- euthymic mood, full affect Neuro- strength and sensation are intact  ICD interrogation- reviewed in detail today,  See PACEART report  EKG:  EKG is not ordered today.  Recent Labs: 08/31/2019: TSH 5.690 09/03/2019: B Natriuretic Peptide 667.8 09/08/2019: Magnesium 1.7 09/10/2019: ALT 31 09/30/2019: BUN 6; Creatinine, Ser 0.83; Hemoglobin 11.8; Platelets 318; Potassium 4.0; Sodium 143   Wt Readings from Last 3 Encounters:  10/02/19 244 lb 12.8 oz (111 kg)  09/30/19 244 lb (110.7 kg)  09/08/19 244 lb 11.4 oz (111 kg)     Other studies Reviewed: Additional studies/ records that were reviewed today include: Recent admission notes, previous EP office notes, most recent labs, previous remote reports.   Assessment and Plan:  1.  Chronic systolic dysfunction s/p [employer] single chamber ICD  euvolemic today Stable on an appropriate medical regimen Normal ICD function See Pace Art report No changes today  2. VF with ICD shock No further  Per Dr. Forde Dandy previous recommendations, will stop amiodarone at this point and follow for further.  If he has further, will need to resume amiodarone, or consider alternative AAD.   3. Suspected Seizure Follow up with PCP. He has had no further incidents since leaving the hospital. It is unclear what brought this on.   Current medicines are reviewed at length with the patient today.   The patient does not have concerns regarding his medicines.  The following changes were made today:  Stop amiodarone.  Labs/ tests ordered today include:  Orders Placed This Encounter  Procedures  . CUP PACEART INCLINIC DEVICE CHECK   Disposition:   Follow up with me in 3 months with cessation of  amiodarone.   Jacalyn Lefevre, PA-C  10/02/2019 1:47 PM  [address] [place] [address] [phone] (office) [phone] (fax)

## 2019-10-02 ENCOUNTER — Encounter: Payer: Self-pay | Admitting: Student

## 2019-10-02 ENCOUNTER — Encounter: Payer: Self-pay | Admitting: Nurse Practitioner

## 2019-10-02 ENCOUNTER — Other Ambulatory Visit: Payer: Self-pay

## 2019-10-02 ENCOUNTER — Ambulatory Visit (INDEPENDENT_AMBULATORY_CARE_PROVIDER_SITE_OTHER): Payer: Self-pay | Admitting: Student

## 2019-10-02 VITALS — BP 102/80 | HR 88 | Ht 76.5 in | Wt 244.8 lb

## 2019-10-02 DIAGNOSIS — D649 Anemia, unspecified: Secondary | ICD-10-CM | POA: Insufficient documentation

## 2019-10-02 DIAGNOSIS — I5022 Chronic systolic (congestive) heart failure: Secondary | ICD-10-CM

## 2019-10-02 DIAGNOSIS — I472 Ventricular tachycardia, unspecified: Secondary | ICD-10-CM

## 2019-10-02 DIAGNOSIS — Z79899 Other long term (current) drug therapy: Secondary | ICD-10-CM

## 2019-10-02 LAB — CUP PACEART INCLINIC DEVICE CHECK
Date Time Interrogation Session: 20210217121434
HighPow Impedance: 62 Ohm
HighPow Impedance: 63 Ohm
Implantable Lead Implant Date: 20120222
Implantable Lead Location: 753860
Implantable Lead Model: 296
Implantable Lead Serial Number: 100951
Implantable Pulse Generator Implant Date: 20120222
Lead Channel Impedance Value: 445 Ohm
Lead Channel Pacing Threshold Amplitude: 1.6 V
Lead Channel Pacing Threshold Pulse Width: 0.5 ms
Lead Channel Sensing Intrinsic Amplitude: 7.1 mV
Lead Channel Setting Pacing Amplitude: 2.5 V
Lead Channel Setting Pacing Pulse Width: 0.5 ms
Lead Channel Setting Sensing Sensitivity: 0.6 mV
Pulse Gen Serial Number: 100238

## 2019-10-02 NOTE — Patient Instructions (Signed)
Medication Instructions:  STOP Amiodarone *If you need a refill on your cardiac medications before your next appointment, please call your pharmacy*  Lab Work: none If you have labs (blood work) drawn today and your tests are completely normal, you will receive your results only by: Marland Kitchen MyChart Message (if you have MyChart) OR . A paper copy in the mail If you have any lab test that is abnormal or we need to change your treatment, we will call you to review the results.  Testing/Procedures: none  Follow-Up: At [facility], you and your health needs are our priority.  As part of our continuing mission to provide you with exceptional heart care, we have created designated Provider Care Teams.  These Care Teams include your primary Cardiologist (physician) and Advanced Practice Providers (APPs -  Physician Assistants and Nurse Practitioners) who all work together to provide you with the care you need, when you need it.  Your next appointment:   3 month(s)  The format for your next appointment:   In Person  Provider:   Otilio Saber, PA  Other Instructions Remote monitoring is used to monitor your  ICD from home. This monitoring reduces the number of office visits required to check your device to one time per year. It allows [place] to keep an eye on the functioning of your device to ensure it is working properly. You are scheduled for a device check from home on 11/18/19. You may send your transmission at any time that day. If you have a wireless device, the transmission will be sent automatically. After your physician reviews your transmission, you will receive a postcard with your next transmission date.

## 2019-10-03 ENCOUNTER — Other Ambulatory Visit: Payer: Self-pay | Admitting: Family Medicine

## 2019-10-03 ENCOUNTER — Ambulatory Visit: Payer: Medicaid Other | Admitting: Nurse Practitioner

## 2019-10-03 DIAGNOSIS — I5022 Chronic systolic (congestive) heart failure: Secondary | ICD-10-CM

## 2019-11-18 ENCOUNTER — Ambulatory Visit (INDEPENDENT_AMBULATORY_CARE_PROVIDER_SITE_OTHER): Payer: Self-pay | Admitting: *Deleted

## 2019-11-18 DIAGNOSIS — I428 Other cardiomyopathies: Secondary | ICD-10-CM

## 2019-11-18 LAB — CUP PACEART REMOTE DEVICE CHECK
Battery Remaining Longevity: 42 mo
Battery Remaining Percentage: 47 %
Brady Statistic RV Percent Paced: 0 %
Date Time Interrogation Session: 20210405110800
HighPow Impedance: 61 Ohm
Implantable Lead Implant Date: 20120222
Implantable Lead Location: 753860
Implantable Lead Model: 296
Implantable Lead Serial Number: 100951
Implantable Pulse Generator Implant Date: 20120222
Lead Channel Impedance Value: 404 Ohm
Lead Channel Pacing Threshold Amplitude: 1.6 V
Lead Channel Pacing Threshold Pulse Width: 0.5 ms
Lead Channel Setting Pacing Amplitude: 2.5 V
Lead Channel Setting Pacing Pulse Width: 0.5 ms
Lead Channel Setting Sensing Sensitivity: 0.6 mV
Pulse Gen Serial Number: 100238

## 2019-11-19 NOTE — Progress Notes (Signed)
ICD Remote  

## 2019-12-18 NOTE — Progress Notes (Signed)
Electrophysiology Office Note Date: 12/19/2019  ID:  Justin Bernard, DOB 09/24/1960, MRN [id number]  PCP: Vevelyn Francois, NP Primary Cardiologist: Loralie Champagne, MD Electrophysiologist: Cristopher Peru, MD   CC: Routine ICD follow-up  Justin Bernard is a 59 y.o. male seen today for Cristopher Peru, MD for routine electrophysiology followup.  Since last being seen in our clinic the patient reports doing well. No further VT, syncope, or seizure like activity.  he denies chest pain, palpitations, dyspnea, PND, orthopnea, nausea, vomiting, dizziness, syncope, edema, weight gain, or early satiety. He has not had ICD shocks.   Device History: Research officer, political party ICD implanted 2012 for chronic systolic CHF History of appropriate therapy: Yes History of AAD therapy: Yes; Previously on amiodarone   Past Medical History:  Diagnosis Date  . AICD (automatic cardioverter/defibrillator) present   . Arthritis    L hip   . Asthma    pt. uses albuterol inhaler in the spring for enviromental allergies   . Cataract    LEFT EYE  . Chronic systolic heart failure (HCC)    NICM, previous heart transplant candidate with EF 10%  . Diabetes mellitus without complication ([place])    TYPE 2  . Family history of adverse reaction to anesthesia    sister- headache & N&V am and mother  . History of hiatal hernia   . History of kidney stones   . Hypertension   . Hypokalemia   . Neuromuscular disorder ([place])    essential tremor  . Osteomyelitis ([place])    a. MSSA osteomyelitis of the right shoulder.  . Ventricular fibrillation ([place])   . Ventricular tachycardia [facility])    Past Surgical History:  Procedure Laterality Date  . BACK SURGERY  07/2000   fusion , [facility]  . BONE EXCISION Right 04/29/2016   Procedure: EXCISION HETEROTOPIC BONE RIGHT HUMERUS;  Surgeon: Altamese [place], MD;  Location: [place];  Service: Orthopedics;  Laterality: Right;  . CARDIAC CATHETERIZATION  08/2012    . CYSTOSCOPY/URETEROSCOPY/HOLMIUM LASER/STENT PLACEMENT Bilateral 04/03/2017   Procedure: CYSTOSCOPY/URETEROSCOPY/HOLMIUM LASER/LEFT STENT REPLACEMENT AND RIGHT   STENT PLACEMENT;  Surgeon: Nickie Retort, MD;  [facility];  Service: Urology;  Laterality: Bilateral;  . FRACTURE SURGERY Left    hip , r shoulder   . HARDWARE REMOVAL Right 04/29/2016   Procedure: HARDWARE REMOVAL;  Surgeon: Altamese [place], MD;  Location: [place];  Service: Orthopedics;  Laterality: Right;  . HERNIA REPAIR Right    inguinal   . HIP SURGERY Left YRS AGO   hardware present, for slipped joint   . HOLMIUM LASER APPLICATION Bilateral 6/59/9357   Procedure: HOLMIUM LASER APPLICATION;  Surgeon: Nickie Retort, MD;  [facility];  Service: Urology;  Laterality: Bilateral;  . ICD  2012   [employer]  . IR URETERAL STENT LEFT NEW ACCESS W/O SEP NEPHROSTOMY CATH  02/20/2017  . NEPHROLITHOTOMY Left 02/20/2017   Procedure: NEPHROLITHOTOMY PERCUTANEOUS;  Surgeon: Nickie Retort, MD;  [facility];  Service: Urology;  Laterality: Left;  . ORIF HUMERUS FRACTURE Right 07/20/2015   Procedure: OPEN REDUCTION INTERNAL FIXATION (ORIF) PROXIMAL HUMERUS FRACTURE;  Surgeon: Netta Cedars, MD;  Location: [place];  Service: Orthopedics;  Laterality: Right;  . TONSILLECTOMY  AS CHILD  . ulnar surgery Right     Current Outpatient Medications  Medication Sig Dispense Refill  . albuterol (VENTOLIN HFA) 108 (90 Base) MCG/ACT inhaler Inhale 2 puffs into the lungs every 6 (six)  hours as needed for wheezing or shortness of breath. 6.7 g 0  . furosemide (LASIX) 40 MG tablet Take 1 tablet (40 mg total) by mouth 2 (two) times daily as needed for fluid. 60 tablet 0  . glucose monitoring kit (FREESTYLE) monitoring kit 1 each by Does not apply route 4 (four) times daily - after meals and at bedtime. 1 month Diabetic Testing Supplies for QAC-QHS accuchecks. 1 each 1  . hydrocortisone cream 1 % Apply 1 application topically 2  (two) times daily. 30 g 0  . Magnesium Oxide 400 (240 Mg) MG TABS Take 1 tablet (400 mg total) by mouth 2 (two) times daily. 60 tablet 2  . Multiple Vitamin (MULTIVITAMIN WITH MINERALS) TABS tablet Take 1 tablet by mouth daily. 30 tablet 0  . potassium chloride SA (KLOR-CON) 20 MEQ tablet Take 2 tablets by mouth in the a.m,, take 1 tablet by mouth in the afternoon 90 tablet 3  . spironolactone (ALDACTONE) 25 MG tablet Take 0.5 tablets (12.5 mg total) by mouth daily. 15 tablet 11  . carvedilol (COREG) 6.25 MG tablet Take 1 tablet (6.25 mg total) by mouth 2 (two) times daily. 180 tablet 3   No current facility-administered medications for this visit.    Allergies:   Penicillins   Social History: Social History   Socioeconomic History  . Marital status: Divorced    Spouse name: Not on file  . Number of children: Not on file  . Years of education: Not on file  . Highest education level: Not on file  Occupational History  . Not on file  Tobacco Use  . Smoking status: Never Smoker  . Smokeless tobacco: Never Used  Substance and Sexual Activity  . Alcohol use: No  . Drug use: No  . Sexual activity: Not Currently  Other Topics Concern  . Not on file  Social History Narrative  . Not on file   Social Determinants of Health   Financial Resource Strain:   . Difficulty of Paying Living Expenses:   Food Insecurity:   . Worried About Charity fundraiser in the Last Year:   . Arboriculturist in the Last Year:   Transportation Needs:   . Film/video editor (Medical):   Marland Kitchen Lack of Transportation (Non-Medical):   Physical Activity:   . Days of Exercise per Week:   . Minutes of Exercise per Session:   Stress:   . Feeling of Stress :   Social Connections:   . Frequency of Communication with Friends and Family:   . Frequency of Social Gatherings with Friends and Family:   . Attends Religious Services:   . Active Member of Clubs or Organizations:   . Attends Archivist  Meetings:   Marland Kitchen Marital Status:   Intimate Partner Violence:   . Fear of Current or Ex-Partner:   . Emotionally Abused:   Marland Kitchen Physically Abused:   . Sexually Abused:     Family History: Family History  Problem Relation Age of Onset  . Heart failure Mother   . Hypertension Mother   . CAD Father   . Hypertension Other   . CAD Other     Review of Systems: All other systems reviewed and are otherwise negative except as noted above.   Physical Exam: Vitals:   12/19/19 1008  BP: 102/60  Pulse: (!) 109  SpO2: 98%  Weight: 234 lb (106.1 kg)  Height: '6\' 4"'$  (1.93 m)     GEN- The  patient is well appearing, alert and oriented x 3 today.   HEENT: normocephalic, atraumatic; sclera clear, conjunctiva pink; hearing intact; oropharynx clear; neck supple, no JVP Lymph- no cervical lymphadenopathy Lungs- Clear to ausculation bilaterally, normal work of breathing.  No wheezes, rales, rhonchi Heart- Regular rate and rhythm, no murmurs, rubs or gallops, PMI not laterally displaced GI- soft, non-tender, non-distended, bowel sounds present, no hepatosplenomegaly Extremities- no clubbing, cyanosis, or edema; DP/PT/radial pulses 2+ bilaterally MS- no significant deformity or atrophy Skin- warm and dry, no rash or lesion; ICD pocket well healed Psych- euthymic mood, full affect Neuro- strength and sensation are intact  ICD interrogation- reviewed in detail today,  See PACEART report  EKG:  EKG is not ordered today.  Recent Labs: 08/31/2019: TSH 5.690 09/03/2019: B Natriuretic Peptide 667.8 09/08/2019: Magnesium 1.7 09/10/2019: ALT 31 09/30/2019: BUN 6; Creatinine, Ser 0.83; Hemoglobin 11.8; Platelets 318; Potassium 4.0; Sodium 143   Wt Readings from Last 3 Encounters:  12/19/19 234 lb (106.1 kg)  10/02/19 244 lb 12.8 oz (111 kg)  09/30/19 244 lb (110.7 kg)     Other studies Reviewed: Additional studies/ records that were reviewed today include: Previous EP office notes, most recent labs,  Previous remote reports   Assessment and Plan:  1.  Chronic systolic dysfunction s/p [employer] single chamber ICD  euvolemic today Stable on an appropriate medical regimen, but not max GDMT.  Increase BB as above Needs  Normal ICD function See Pace Art report No changes today Needs CHF team follow up. Labs today on spiro and with recent VF  2. VF with ICD shock He is off amiodarone for now.  No further.  Increase coreg to 6.25 mg BID.    3. Possible Seizure Following with PCP  Current medicines are reviewed at length with the patient today.   The patient does not have concerns regarding his medicines.  The following changes were made today:  Increase coreg. If BP too low, consider switch to Toprol  Labs/ tests ordered today include:  Orders Placed This Encounter  Procedures  . Basic Metabolic Panel (BMET)  . Magnesium   Disposition:   Follow up with EP APP in 6 months. Then Dr. Lovena Le in 12 months from that barring any further arrhyhtmia   Signed, [name] Friar, PA-C  12/19/2019 10:38 AM  [address] [place]  [address] [phone] (office) [phone] (fax)

## 2019-12-19 ENCOUNTER — Ambulatory Visit (INDEPENDENT_AMBULATORY_CARE_PROVIDER_SITE_OTHER): Payer: Self-pay | Admitting: Student

## 2019-12-19 ENCOUNTER — Other Ambulatory Visit: Payer: Self-pay

## 2019-12-19 ENCOUNTER — Encounter: Payer: Self-pay | Admitting: Student

## 2019-12-19 VITALS — BP 102/60 | HR 109 | Ht 76.0 in | Wt 234.0 lb

## 2019-12-19 DIAGNOSIS — I5022 Chronic systolic (congestive) heart failure: Secondary | ICD-10-CM

## 2019-12-19 DIAGNOSIS — Z79899 Other long term (current) drug therapy: Secondary | ICD-10-CM

## 2019-12-19 DIAGNOSIS — I472 Ventricular tachycardia, unspecified: Secondary | ICD-10-CM

## 2019-12-19 LAB — BASIC METABOLIC PANEL
BUN/Creatinine Ratio: 12 (ref 9–20)
BUN: 13 mg/dL (ref 6–24)
CO2: 25 mmol/L (ref 20–29)
Calcium: 9.8 mg/dL (ref 8.7–10.2)
Chloride: 100 mmol/L (ref 96–106)
Creatinine, Ser: 1.12 mg/dL (ref 0.76–1.27)
GFR calc Af Amer: 84 mL/min/{1.73_m2} (ref >59)
GFR calc non Af Amer: 73 mL/min/{1.73_m2} (ref >59)
Glucose: 102 mg/dL — ABNORMAL HIGH (ref 65–99)
Potassium: 4.4 mmol/L (ref 3.5–5.2)
Sodium: 142 mmol/L (ref 134–144)

## 2019-12-19 LAB — CUP PACEART INCLINIC DEVICE CHECK
Date Time Interrogation Session: 20210506103715
HighPow Impedance: 62 Ohm
HighPow Impedance: 74 Ohm
Implantable Lead Implant Date: 20120222
Implantable Lead Location: 753860
Implantable Lead Model: 296
Implantable Lead Serial Number: 100951
Implantable Pulse Generator Implant Date: 20120222
Lead Channel Impedance Value: 506 Ohm
Lead Channel Pacing Threshold Amplitude: 1.3 V
Lead Channel Pacing Threshold Pulse Width: 0.7 ms
Lead Channel Sensing Intrinsic Amplitude: 8.7 mV
Lead Channel Setting Pacing Amplitude: 2.5 V
Lead Channel Setting Pacing Pulse Width: 0.7 ms
Lead Channel Setting Sensing Sensitivity: 0.6 mV
Pulse Gen Serial Number: 100238

## 2019-12-19 LAB — MAGNESIUM: Magnesium: 1.8 mg/dL (ref 1.6–2.3)

## 2019-12-19 MED ORDER — CARVEDILOL 6.25 MG PO TABS: 6.2500 mg | ORAL_TABLET | Freq: Two times a day (BID) | ORAL | 3 refills | Status: AC

## 2019-12-19 NOTE — Patient Instructions (Signed)
Medication Instructions:  INCREASE COREG to 6.25 mg Twice Daily *If you need a refill on your cardiac medications before your next appointment, please call your pharmacy*   Lab Work:  TODAY BMET MAGNESIUM If you have labs (blood work) drawn today and your tests are completely normal, you will receive your results only by: Marland Kitchen MyChart Message (if you have MyChart) OR . A paper copy in the mail If you have any lab test that is abnormal or we need to change your treatment, we will call you to review the results.   Testing/Procedures: NONE   Follow-Up: At [facility], you and your health needs are our priority.  As part of our continuing mission to provide you with exceptional heart care, we have created designated Provider Care Teams.  These Care Teams include your primary Cardiologist (physician) and Advanced Practice Providers (APPs -  Physician Assistants and Nurse Practitioners) who all work together to provide you with the care you need, when you need it.  Your next appointment:   6 month(s)  The format for your next appointment:   In Person  Provider:   Otilio Saber, PA   Other Instructions Remote monitoring is used to monitor your ICD from home. This monitoring reduces the number of office visits required to check your device to one time per year. It allows [place] to keep an eye on the functioning of your device to ensure it is working properly. You are scheduled for a device check from home on 02/18/20. You may send your transmission at any time that day. If you have a wireless device, the transmission will be sent automatically. After your physician reviews your transmission, you will receive a postcard with your next transmission date.

## 2019-12-20 ENCOUNTER — Telehealth: Payer: Self-pay

## 2019-12-20 MED ORDER — MAGNESIUM OXIDE -MG SUPPLEMENT 400 (240 MG) MG PO TABS: 400.0000 mg | ORAL_TABLET | Freq: Three times a day (TID) | ORAL | 3 refills | Status: AC

## 2019-12-20 NOTE — Telephone Encounter (Signed)
-----   Message from Graciella Freer, PA-C sent at 12/20/2019  8:12 AM EDT ----- Can we please increased Magnesium tablets to TID?  Thank you!

## 2019-12-20 NOTE — Telephone Encounter (Signed)
The patient has been notified of the lab result and verbalized understanding.  All questions (if any) were answered. Sigurd Sos, RN 12/20/2019 8:53 AM

## 2019-12-30 ENCOUNTER — Ambulatory Visit: Payer: Medicaid Other | Admitting: Nurse Practitioner

## 2020-01-17 ENCOUNTER — Telehealth: Payer: Self-pay | Admitting: Internal Medicine

## 2020-02-24 ENCOUNTER — Ambulatory Visit (INDEPENDENT_AMBULATORY_CARE_PROVIDER_SITE_OTHER): Payer: Self-pay | Admitting: *Deleted

## 2020-02-24 DIAGNOSIS — I428 Other cardiomyopathies: Secondary | ICD-10-CM

## 2020-02-24 LAB — CUP PACEART REMOTE DEVICE CHECK
Battery Remaining Longevity: 42 mo
Battery Remaining Percentage: 44 %
Brady Statistic RV Percent Paced: 0 %
Date Time Interrogation Session: 20210711141000
HighPow Impedance: 75 Ohm
Implantable Lead Implant Date: 20120222
Implantable Lead Location: 753860
Implantable Lead Model: 296
Implantable Lead Serial Number: 100951
Implantable Pulse Generator Implant Date: 20120222
Lead Channel Impedance Value: 476 Ohm
Lead Channel Pacing Threshold Amplitude: 1.3 V
Lead Channel Pacing Threshold Pulse Width: 0.7 ms
Lead Channel Setting Pacing Amplitude: 2.5 V
Lead Channel Setting Pacing Pulse Width: 0.7 ms
Lead Channel Setting Sensing Sensitivity: 0.6 mV
Pulse Gen Serial Number: 100238

## 2020-03-04 ENCOUNTER — Telehealth: Payer: Self-pay | Admitting: Student

## 2020-03-04 NOTE — Telephone Encounter (Signed)
Sister of the patient called. The patient passed away Mar 03, 2020. The EMS that found him said he passed away in his sleep. [facility] needs a doctor to sign the death certificate, and the sister wanted to know if Otilio Saber or Dr.McLean would sign the death certificate.  Please leave a message if the office is not able to reach the patient's sister.  [facility] is where the patient's remains are, They can be reached at [phone]

## 2020-03-12 ENCOUNTER — Telehealth: Payer: Self-pay | Admitting: Internal Medicine

## 2020-03-12 NOTE — Telephone Encounter (Signed)
Left message advising no way to know what cause of death was

## 2020-03-12 NOTE — Telephone Encounter (Signed)
  Sister is calling because her mother is wanting to know what the cause of death was for the patient.

## 2020-03-15 NOTE — Progress Notes (Signed)
Remote ICD transmission.   

## 2020-03-15 DEATH — deceased

## 2022-05-12 NOTE — Telephone Encounter (Signed)
Opened in error
